# Patient Record
Sex: Female | Born: 1955 | Race: White | Hispanic: No | Smoking: Current every day smoker
Health system: Southern US, Community
[De-identification: ages and names within clinical notes are randomized; demographics above are authoritative.]

## PROBLEM LIST (undated history)

## (undated) DIAGNOSIS — K642 Third degree hemorrhoids: Secondary | ICD-10-CM

## (undated) DIAGNOSIS — R42 Dizziness and giddiness: Secondary | ICD-10-CM

## (undated) DIAGNOSIS — K589 Irritable bowel syndrome without diarrhea: Secondary | ICD-10-CM

## (undated) DIAGNOSIS — F259 Schizoaffective disorder, unspecified: Secondary | ICD-10-CM

## (undated) DIAGNOSIS — R296 Repeated falls: Secondary | ICD-10-CM

## (undated) DIAGNOSIS — F0390 Unspecified dementia without behavioral disturbance: Secondary | ICD-10-CM

## (undated) DIAGNOSIS — M5136 Other intervertebral disc degeneration, lumbar region: Secondary | ICD-10-CM

## (undated) DIAGNOSIS — K219 Gastro-esophageal reflux disease without esophagitis: Secondary | ICD-10-CM

## (undated) DIAGNOSIS — F329 Major depressive disorder, single episode, unspecified: Secondary | ICD-10-CM

## (undated) DIAGNOSIS — F32A Depression, unspecified: Secondary | ICD-10-CM

## (undated) DIAGNOSIS — K648 Other hemorrhoids: Secondary | ICD-10-CM

## (undated) DIAGNOSIS — I514 Myocarditis, unspecified: Secondary | ICD-10-CM

## (undated) DIAGNOSIS — M1990 Unspecified osteoarthritis, unspecified site: Secondary | ICD-10-CM

## (undated) DIAGNOSIS — D126 Benign neoplasm of colon, unspecified: Secondary | ICD-10-CM

## (undated) DIAGNOSIS — K5790 Diverticulosis of intestine, part unspecified, without perforation or abscess without bleeding: Secondary | ICD-10-CM

## (undated) HISTORY — DX: Repeated falls: R29.6

## (undated) HISTORY — DX: Third degree hemorrhoids: K64.2

## (undated) HISTORY — DX: Unspecified osteoarthritis, unspecified site: M19.90

## (undated) HISTORY — PX: CHOLECYSTECTOMY: SHX55

## (undated) HISTORY — PX: HEMORRHOID BANDING: SHX5850

## (undated) HISTORY — DX: Major depressive disorder, single episode, unspecified: F32.9

## (undated) HISTORY — DX: Dizziness and giddiness: R42

## (undated) HISTORY — DX: Other hemorrhoids: K64.8

## (undated) HISTORY — DX: Myocarditis, unspecified: I51.4

## (undated) HISTORY — DX: Gastro-esophageal reflux disease without esophagitis: K21.9

## (undated) HISTORY — DX: Diverticulosis of intestine, part unspecified, without perforation or abscess without bleeding: K57.90

## (undated) HISTORY — DX: Other intervertebral disc degeneration, lumbar region: M51.36

## (undated) HISTORY — DX: Unspecified dementia, unspecified severity, without behavioral disturbance, psychotic disturbance, mood disturbance, and anxiety: F03.90

## (undated) HISTORY — DX: Depression, unspecified: F32.A

## (undated) HISTORY — DX: Benign neoplasm of colon, unspecified: D12.6

## (undated) HISTORY — DX: Schizoaffective disorder, unspecified: F25.9

## (undated) HISTORY — DX: Irritable bowel syndrome, unspecified: K58.9

---

## 2006-10-11 ENCOUNTER — Inpatient Hospital Stay (HOSPITAL_COMMUNITY): Admission: AD | Admit: 2006-10-11 | Discharge: 2006-10-17 | Payer: Self-pay | Admitting: *Deleted

## 2006-10-11 ENCOUNTER — Ambulatory Visit: Payer: Self-pay | Admitting: *Deleted

## 2006-12-18 ENCOUNTER — Emergency Department (HOSPITAL_COMMUNITY): Admission: EM | Admit: 2006-12-18 | Discharge: 2006-12-18 | Payer: Self-pay | Admitting: Emergency Medicine

## 2007-04-11 DIAGNOSIS — I514 Myocarditis, unspecified: Secondary | ICD-10-CM

## 2007-04-11 HISTORY — DX: Myocarditis, unspecified: I51.4

## 2008-09-03 ENCOUNTER — Emergency Department (HOSPITAL_BASED_OUTPATIENT_CLINIC_OR_DEPARTMENT_OTHER): Admission: EM | Admit: 2008-09-03 | Discharge: 2008-09-03 | Payer: Self-pay | Admitting: Emergency Medicine

## 2008-09-14 ENCOUNTER — Emergency Department (HOSPITAL_COMMUNITY): Admission: EM | Admit: 2008-09-14 | Discharge: 2008-09-14 | Payer: Self-pay | Admitting: Emergency Medicine

## 2010-07-18 LAB — COMPREHENSIVE METABOLIC PANEL
AST: 38 U/L — ABNORMAL HIGH (ref 0–37)
Albumin: 3.7 g/dL (ref 3.5–5.2)
Alkaline Phosphatase: 108 U/L (ref 39–117)
CO2: 27 mEq/L (ref 19–32)
Chloride: 105 mEq/L (ref 96–112)
Creatinine, Ser: 0.84 mg/dL (ref 0.4–1.2)
GFR calc Af Amer: >60 mL/min (ref >60)
GFR calc non Af Amer: >60 mL/min (ref >60)
Potassium: 3.1 mEq/L — ABNORMAL LOW (ref 3.5–5.1)
Total Bilirubin: 0.7 mg/dL (ref 0.3–1.2)

## 2010-07-18 LAB — URINALYSIS, ROUTINE W REFLEX MICROSCOPIC
Bilirubin Urine: NEGATIVE
Hgb urine dipstick: NEGATIVE
Ketones, ur: NEGATIVE mg/dL
Nitrite: NEGATIVE
Protein, ur: NEGATIVE mg/dL
Urobilinogen, UA: 1 mg/dL (ref 0.0–1.0)

## 2010-07-18 LAB — CBC
HCT: 43 % (ref 36.0–46.0)
MCV: 90.6 fL (ref 78.0–100.0)
Platelets: 269 10*3/uL (ref 150–400)
RBC: 4.74 MIL/uL (ref 3.87–5.11)
WBC: 9.9 10*3/uL (ref 4.0–10.5)

## 2010-07-18 LAB — URINE CULTURE: Colony Count: 60000

## 2010-07-18 LAB — DIFFERENTIAL
Basophils Absolute: 0.1 10*3/uL (ref 0.0–0.1)
Basophils Relative: 1 % (ref 0–1)
Eosinophils Absolute: 0.1 10*3/uL (ref 0.0–0.7)
Eosinophils Relative: 1 % (ref 0–5)
Lymphocytes Relative: 22 % (ref 12–46)
Monocytes Absolute: 0.7 10*3/uL (ref 0.1–1.0)

## 2010-07-18 LAB — LIPASE, BLOOD: Lipase: 17 U/L (ref 11–59)

## 2010-07-18 LAB — URINE MICROSCOPIC-ADD ON

## 2010-07-19 LAB — CBC
HCT: 51 % — ABNORMAL HIGH (ref 36.0–46.0)
Hemoglobin: 17.4 g/dL — ABNORMAL HIGH (ref 12.0–15.0)
MCHC: 34.1 g/dL (ref 30.0–36.0)
Platelets: 275 10*3/uL (ref 150–400)
RDW: 12.7 % (ref 11.5–15.5)

## 2010-07-19 LAB — BASIC METABOLIC PANEL
BUN: 17 mg/dL (ref 6–23)
CO2: 28 mEq/L (ref 19–32)
Calcium: 9.8 mg/dL (ref 8.4–10.5)
Glucose, Bld: 91 mg/dL (ref 70–99)
Sodium: 140 mEq/L (ref 135–145)

## 2010-07-19 LAB — DIFFERENTIAL
Basophils Absolute: 0.2 10*3/uL — ABNORMAL HIGH (ref 0.0–0.1)
Basophils Relative: 2 % — ABNORMAL HIGH (ref 0–1)
Eosinophils Relative: 1 % (ref 0–5)
Monocytes Absolute: 0.9 10*3/uL (ref 0.1–1.0)
Neutro Abs: 6.6 10*3/uL (ref 1.7–7.7)

## 2010-07-19 LAB — URINALYSIS, ROUTINE W REFLEX MICROSCOPIC
Ketones, ur: 15 mg/dL — AB
Nitrite: NEGATIVE
Urobilinogen, UA: 1 mg/dL (ref 0.0–1.0)

## 2010-07-19 LAB — URINE MICROSCOPIC-ADD ON

## 2010-07-19 LAB — POCT TOXICOLOGY PANEL

## 2010-08-23 NOTE — H&P (Signed)
[name], [name]              ACCOUNT NO.:  [id number]   MEDICAL RECORD NO.:  [id number]          PATIENT TYPE:  IPS   LOCATION:  0303                          FACILITY:  [facility]   PHYSICIAN:  Jasmine Pang, M.D. DATE OF BIRTH:  [birth year]   DATE OF ADMISSION:  10/11/2006  DATE OF DISCHARGE:                       PSYCHIATRIC ADMISSION ASSESSMENT   IDENTIFYING INFORMATION:  This is a 55 year old female voluntarily  admitted on October 11, 2006.   HISTORY OF PRESENT ILLNESS:  The patient present psychotic and paranoid,  endorsing auditory hallucinations, apparently some noncompliance of  medications.  Per chart, states that the patient apparently called 9-1-  1.  Police came over and found the patient almost catatonic.  When the  patient arrived, she was refusing to answer any questions, refusing to  confirm any current medications.  She made the statement that she  wished she never came.  She does state that she feels that she needs  some medications.   PAST PSYCHIATRIC HISTORY:  First admission to the [facility].  She is currently sponsored by [facility] and  appears to be a patient at [facility].   SOCIAL HISTORY:  This is a 54 year old female that lives alone.  She is  unemployed.   FAMILY HISTORY:  Unknown.   ALCOHOL/DRUG HISTORY:  There seems to be no apparent alcohol or drug  use.   PRIMARY CARE PHYSICIAN:  The patient says none.   MEDICAL PROBLEMS:  Patient reports that she has some problems with blood  pressure but it is not known if she is on any medications.   MEDICATIONS:  Per chart, the patient has been on Celexa, trazodone and  Vistaril and the patient refers to being on Abilify.   ALLERGIES:  No known allergies.   PAST MEDICAL HISTORY:  Some question in regards to hypertension.   MEDICATIONS:  Seems to be Celexa, trazodone and Abilify.   REVIEW OF SYSTEMS:  Noncontributory.  The patient is not willing  to  answer questions.   PHYSICAL EXAMINATION:  Temperature is 97.7, heart rate 68, respirations  16, blood pressure 126/81, height 5 feet 2 inches tall, weight 140  pounds.  Physical exam at this time is deferred because the patient is  paranoid and fearful of others.  She does appear however very well-  nourished and in no acute distress.  She is fair-skinned and again does  not seem to be in any acute physical distress.   LABORATORY DATA:  Her labs are pending at this time.   MENTAL STATUS EXAM:  She is in the bed.  She appears again asking for  medications.  Questioning why we have keys in our hands and opens eyes  briefly.  Her speech is soft-spoken with some thought blocking.  The  patient is guarded, possibly responding to internal stimuli, has the  covers up to her chin, making very little movements.  Thought process  are some questionable auditory hallucinations with some paranoid  ideation.  Cognitive functioning, the patient is aware of herself.  Her  judgment and insight are poor.  Poor historian.   DIAGNOSES:  AXIS I:  Psychosis not otherwise specified.  AXIS II:  Deferred.  AXIS III:  Unknown.  AXIS IV:  Other psychosocial problems.  AXIS V:  Current 25.   PLAN:  Contract for safety.  Stabilize mood and thinking.  Will have  Zyprexa scheduled with some p.r.n. dosing available with some Ativan for  anxiety.  We will gather more information.  The patient is to follow up  with [facility] and be medication compliance.   TENTATIVE LENGTH OF STAY:  Her tentative length of stay at this time is  five to six days.      Landry Corporal, N.P.      Jasmine Pang, M.D.  Electronically Signed    JO/MEDQ  D:  10/12/2006  T:  10/12/2006  Job:  [id number]

## 2010-08-23 NOTE — Discharge Summary (Signed)
[name], [name]              ACCOUNT NO.:  [id number]   MEDICAL RECORD NO.:  [id number]          PATIENT TYPE:  IPS   LOCATION:  0303                          FACILITY:  [facility]   PHYSICIAN:  Jasmine Pang, M.D. DATE OF BIRTH:  Sep 10, 1955   DATE OF ADMISSION:  10/11/2006  DATE OF DISCHARGE:  10/17/2006                               DISCHARGE SUMMARY   IDENTIFYING INFORMATION:  This is a 55 year old white female who was  admitted on a voluntary basis on October 11, 2006.   HISTORY OF PRESENT ILLNESS:  The patient presented with psychotic and  paranoid ideation.  She endorses auditory hallucinations.  There has  apparently been some noncompliance of medication.  Per chart, it is  indicated that the patient called 9-1-1.  The police came over and found  the patient almost catatonic.  When she arrived, she was refusing to  answer any questions or refusing to confirm any current medications.  She made the statement that she wished she never came.  She does state  that she feels she needs some medications.  This is the first admission  to [facility].  The patient is currently sponsored by the  [facility] and she is a patient at [facility].  There is no apparent drug or alcohol use.  The  patient has some problems with blood pressures.   MEDICATIONS:  The patient has been on Celexa, trazodone, Vistaril and  Abilify in the past.   ALLERGIES:  No known drug allergies.   PHYSICAL EXAMINATION:  Physical exam was done in the ED prior to  admission.  There were no acute physical or medical problems.   LABORATORY:  Comprehensive metabolic panel was within normal limits.  CBC revealed an elevated RBC count of 5.13, elevated hemoglobin of 16.4  and an elevated hematocrit of 47.2.  TSH was normal at 1.787.  Urinalysis revealed 7-10 wbc and many bacteria.   HOSPITAL COURSE:  The patient was begun on Zyprexa Zydis 5 mg p.o.  q.h.s. and  Zyprexa Zydis 5 mg p.o. q.6h. p.r.n. psychosis.  On October 12, 2006, Zyprexa Zydis was increased to 5 mg in the morning and 5 mg at  h.s.  She was also started on Ativan 1 mg p.o. q.4h. p.r.n. anxiety,  agitation.  On October 14, 2006, Zyprexa was changed to 10 mg p.o. q.h.s.  On October 15, 2006, Zyprexa was increased to 15 mg p.o. q.h.s.  The patient  was not treated for UTI while on our unit but was given a prescription  for Cipro 250 mg p.o. b.i.d. x3 days upon discharge.   Upon first meeting the patient, she was lying in bed with fair eye  contact.  She did speak to me but speech was soft and slow.  There was  positive psychomotor retardation but she was not catatonic at this  point.  She admitted to being depressed and suicidal before coming in  here.  She remembered calling the police to come to her apartment.  As  her hospitalization progressed, the patient's mood  improved.  She was  complaining of sedation on medications.  She did continue to feel my  thoughts aren't straight yet.  She had no suicidal or homicidal  ideation.  Thoughts were still somewhat disorganized.  She was paranoid  at times and fearful about what I was writing down when we talked.  Zyprexa was increased to 15 mg p.o. q.h.s.  The patient was able to  participate appropriately in unit therapeutic groups and activities.  On  October 16, 2006, the patient's mood was less depressed and anxious.  No  suicidal or homicidal ideation.  No auditory or visual hallucinations.  She still felt like her thinking was slightly disorganized but nothing  remarkable.  On October 17, 2006, the patient's mental status had improved  markedly from admission status.  She was friendly and cooperative with  good eye contact.  Speech was normal rate and flow.  Psychomotor  activity was within normal limits.  Mood was euthymic.  Affect wide  range.  There was no suicidal or homicidal ideation.  No thoughts of  self-injurious behavior.  No paranoia or  delusions.  Thoughts were  logical and goal-directed.  No auditory or visual hallucinations.  Thought content no predominant theme.  Cognitive was grossly back to  baseline.  It was felt the patient was safe to be discharged today.   DISCHARGE DIAGNOSES:  AXIS I:  Psychotic disorder not otherwise  specified.  AXIS II:  None.  AXIS III:  Hypertension.  AXIS IV:  Moderate (other psychosocial problems, burden of psychiatric  illness).  AXIS V:  GAF on discharge 50; GAF admission 25; GAF highest past year 55-  60.   ACTIVITY/DIET:  There were no specific activity level or dietary  restrictions.   FOLLOWUP:  The patient will be seen by Dr. Lang Snow at the [facility]  on October 18, 2006 at 8:30 a.m.   DISCHARGE MEDICATIONS:  1. Zyprexa 15 mg p.o. q.h.s.  2. Ambien 10 mg p.o. q.h.s.  3. Cipro 250 mg p.o. b.i.d. x3 days.      Jasmine Pang, M.D.  Electronically Signed     BHS/MEDQ  D:  10/17/2006  T:  10/17/2006  Job:  [id number]

## 2011-01-20 LAB — URINALYSIS, ROUTINE W REFLEX MICROSCOPIC
Glucose, UA: NEGATIVE
Nitrite: NEGATIVE
Protein, ur: NEGATIVE
Urobilinogen, UA: 0.2

## 2011-01-20 LAB — DIFFERENTIAL
Basophils Absolute: 0.1
Basophils Relative: 1
Monocytes Relative: 7
Neutro Abs: 7.2
Neutrophils Relative %: 75

## 2011-01-20 LAB — URINE CULTURE: Colony Count: 6000

## 2011-01-20 LAB — COMPREHENSIVE METABOLIC PANEL
Alkaline Phosphatase: 120 — ABNORMAL HIGH
BUN: <1 — ABNORMAL LOW
Calcium: 9.6
Glucose, Bld: 97
Potassium: 3.5
Total Protein: 7.1

## 2011-01-20 LAB — CBC
HCT: 44.3
Hemoglobin: 15.6 — ABNORMAL HIGH
MCHC: 35.3
MCV: 92.1
RDW: 14

## 2011-01-20 LAB — URINE MICROSCOPIC-ADD ON

## 2011-01-24 LAB — COMPREHENSIVE METABOLIC PANEL
Alkaline Phosphatase: 90
BUN: 6
Chloride: 103
Glucose, Bld: 96
Potassium: 3.7
Total Bilirubin: 0.8

## 2011-01-24 LAB — CBC
HCT: 47.2 — ABNORMAL HIGH
Hemoglobin: 16.4 — ABNORMAL HIGH
RBC: 5.13 — ABNORMAL HIGH
WBC: 9.6

## 2011-01-24 LAB — DRUGS OF ABUSE SCREEN W/O ALC, ROUTINE URINE
Amphetamine Screen, Ur: NEGATIVE
Barbiturate Quant, Ur: NEGATIVE
Creatinine,U: 162.6
Methadone: NEGATIVE

## 2011-01-24 LAB — URINE MICROSCOPIC-ADD ON

## 2011-01-24 LAB — URINALYSIS, ROUTINE W REFLEX MICROSCOPIC
Glucose, UA: NEGATIVE
Hgb urine dipstick: NEGATIVE
pH: 6

## 2011-01-24 LAB — TSH: TSH: 1.787

## 2011-08-09 DIAGNOSIS — D126 Benign neoplasm of colon, unspecified: Secondary | ICD-10-CM

## 2011-08-09 HISTORY — DX: Benign neoplasm of colon, unspecified: D12.6

## 2012-12-27 HISTORY — PX: BREAST BIOPSY: SHX20

## 2013-09-16 ENCOUNTER — Telehealth (HOSPITAL_COMMUNITY): Payer: Self-pay

## 2013-10-31 ENCOUNTER — Encounter: Payer: Self-pay | Admitting: Gastroenterology

## 2013-11-04 ENCOUNTER — Encounter (INDEPENDENT_AMBULATORY_CARE_PROVIDER_SITE_OTHER): Payer: Self-pay

## 2013-11-04 ENCOUNTER — Ambulatory Visit (INDEPENDENT_AMBULATORY_CARE_PROVIDER_SITE_OTHER): Payer: Medicaid Other | Admitting: Psychiatry

## 2013-11-04 ENCOUNTER — Encounter (HOSPITAL_COMMUNITY): Payer: Self-pay | Admitting: Psychiatry

## 2013-11-04 VITALS — BP 135/84 | HR 68 | Ht 63.0 in | Wt 183.0 lb

## 2013-11-04 DIAGNOSIS — F259 Schizoaffective disorder, unspecified: Secondary | ICD-10-CM

## 2013-11-04 DIAGNOSIS — F17210 Nicotine dependence, cigarettes, uncomplicated: Secondary | ICD-10-CM | POA: Insufficient documentation

## 2013-11-04 DIAGNOSIS — F17200 Nicotine dependence, unspecified, uncomplicated: Secondary | ICD-10-CM

## 2013-11-04 DIAGNOSIS — F4310 Post-traumatic stress disorder, unspecified: Secondary | ICD-10-CM | POA: Insufficient documentation

## 2013-11-04 DIAGNOSIS — F251 Schizoaffective disorder, depressive type: Secondary | ICD-10-CM | POA: Insufficient documentation

## 2013-11-04 MED ORDER — SERTRALINE HCL 100 MG PO TABS: 150.0000 mg | ORAL_TABLET | Freq: Every day | ORAL | Status: DC
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
## 2013-11-05 ENCOUNTER — Telehealth (HOSPITAL_COMMUNITY): Payer: Self-pay | Admitting: *Deleted

## 2013-11-05 NOTE — Telephone Encounter (Signed)
Patient left WG:NFAOZH dose was increased by MD. It was discussed.Thought MD sending new RX, but she did not.Please call. Phoned patient - Left message: Prescription for Zoloft 100 mg, take 1.5 daily sent to [facility] (listed as preferred pharmacy) on 7/28 and noted as received by pharmacy

## 2013-12-09 ENCOUNTER — Ambulatory Visit (INDEPENDENT_AMBULATORY_CARE_PROVIDER_SITE_OTHER): Payer: Medicaid Other | Admitting: Cardiology

## 2013-12-09 ENCOUNTER — Encounter: Payer: Self-pay | Admitting: Cardiology

## 2013-12-09 VITALS — BP 120/88 | Ht 63.0 in | Wt 180.0 lb

## 2013-12-09 DIAGNOSIS — R0609 Other forms of dyspnea: Secondary | ICD-10-CM

## 2013-12-09 DIAGNOSIS — E669 Obesity, unspecified: Secondary | ICD-10-CM

## 2013-12-09 DIAGNOSIS — R0989 Other specified symptoms and signs involving the circulatory and respiratory systems: Secondary | ICD-10-CM

## 2013-12-09 DIAGNOSIS — I514 Myocarditis, unspecified: Secondary | ICD-10-CM

## 2013-12-09 DIAGNOSIS — R0600 Dyspnea, unspecified: Secondary | ICD-10-CM
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
## 2013-12-09 NOTE — Patient Instructions (Signed)
The current medical regimen is effective;  continue present plan and medications.  Your physician has requested that you have an echocardiogram. Echocardiography is a painless test that uses sound waves to create images of your heart. It provides your doctor with information about the size and shape of your heart and how well your heart's chambers and valves are working. This procedure takes approximately one hour. There are no restrictions for this procedure.  Follow up as needed. 

## 2013-12-12 ENCOUNTER — Ambulatory Visit (HOSPITAL_COMMUNITY): Payer: PRIVATE HEALTH INSURANCE | Attending: Cardiology | Admitting: Radiology

## 2013-12-12 DIAGNOSIS — R0600 Dyspnea, unspecified: Secondary | ICD-10-CM

## 2013-12-12 DIAGNOSIS — I514 Myocarditis, unspecified: Secondary | ICD-10-CM

## 2013-12-12 DIAGNOSIS — R0602 Shortness of breath: Secondary | ICD-10-CM | POA: Insufficient documentation

## 2013-12-12 NOTE — Progress Notes (Signed)
Echocardiogram performed.  

## 2014-01-07 ENCOUNTER — Ambulatory Visit (INDEPENDENT_AMBULATORY_CARE_PROVIDER_SITE_OTHER): Payer: Medicaid Other | Admitting: Gastroenterology

## 2014-01-07 ENCOUNTER — Encounter: Payer: Self-pay | Admitting: Gastroenterology

## 2014-01-07 VITALS — BP 110/78 | HR 68 | Ht 63.0 in | Wt 181.8 lb

## 2014-01-07 DIAGNOSIS — K589 Irritable bowel syndrome without diarrhea: Secondary | ICD-10-CM

## 2014-01-07 DIAGNOSIS — Z86010 Personal history of colonic polyps: Secondary | ICD-10-CM

## 2014-01-07 DIAGNOSIS — K219 Gastro-esophageal reflux disease without esophagitis: Secondary | ICD-10-CM

## 2014-01-07 DIAGNOSIS — R198 Other specified symptoms and signs involving the digestive system and abdomen: Secondary | ICD-10-CM

## 2014-01-07 MED ORDER — OMEPRAZOLE 40 MG PO CPDR: 40.0000 mg | DELAYED_RELEASE_CAPSULE | Freq: Every morning | ORAL | Status: DC

## 2014-01-07 MED ORDER — DICYCLOMINE HCL 10 MG PO CAPS: 10.0000 mg | ORAL_CAPSULE | Freq: Three times a day (TID) | ORAL | Status: DC

## 2014-01-07 NOTE — Progress Notes (Signed)
    History of Present Illness: This is a 58 year old female who relates a very long history of alternating diarrhea and constipation associated with crampy generalized abdominal pain. Her abdominal pain is generally relieved by bowel movements. Her IBS symptoms generally worsened with stress. She also has had GERD and has been treated for acid reflux since the 1990s. She states she underwent upper endoscopy in 1990s which was unremarkable. She underwent colonoscopy in 2004 in [place] it showed a polyp. Subsequent colonoscopy by Dr. Toy Cookey in May 2013 revealed internal hemorrhoids, diverticulosis and a small single tubular adenoma in the transverse colon. She has frequent breakthrough reflux symptoms. She eats 1 large meal each day. Denies weight loss, change in stool caliber, melena, hematochezia, nausea, vomiting, dysphagia, chest pain.  Review of Systems: Pertinent positive and negative review of systems were noted in the above HPI section. All other review of systems were otherwise negative.  Current Medications, Allergies, Past Medical History, Past Surgical History, Family History and Social History were reviewed in [facility] record.  Physical Exam: General: Well developed , well nourished, no acute distress Head: Normocephalic and atraumatic Eyes:  sclerae anicteric, EOMI Ears: Normal auditory acuity Mouth: No deformity or lesions Neck: Supple, no masses or thyromegaly Lungs: Clear throughout to auscultation Heart: Regular rate and rhythm; no murmurs, rubs or bruits Abdomen: Soft, non tender and non distended. No masses, hepatosplenomegaly or hernias noted. Normal Bowel sounds Musculoskeletal: Symmetrical with no gross deformities  Skin: No lesions on visible extremities Pulses:  Normal pulses noted Extremities: No clubbing, cyanosis, edema or deformities noted Neurological: Alert oriented x 4, grossly nonfocal Cervical Nodes:  No significant cervical  adenopathy Inguinal Nodes: No significant inguinal adenopathy Psychological:  Alert and cooperative. Normal mood and affect  Assessment and Recommendations:  1. Personal history of adenomatous colon polyp initially diagnosed on colonoscopy in May 2013. Five-year interval surveillance colonoscopy in May 2018.  2. Irritable bowel syndrome with abdominal pain and alternating diarrhea and constipation. Increase dietary fiber and water intake. Daily probiotic if helpful. Bentyl 10 mg tid ac. Avoid foods that trigger symptoms.   3. GERD. Antireflux measures including bed blocks. Change timing of omeprazole to 40 mg before evening meal. Advised to have 3 meals each day, even if small. Avoid having 1 large meal. Consider EGD if symptoms not well controlled.  4. Schizoaffective disorder and PTSD likely contributing to IBS symptoms.

## 2014-01-07 NOTE — Patient Instructions (Signed)
We have sent the following medications to your pharmacy for you to pick up at your convenience:Bentyl.  Start taking your omeprazole 40 mg one tablet by mouth every morning. A new prescription has been sent to your pharmacy.   Patient advised to avoid spicy, acidic, citrus, chocolate, mints, fruit and fruit juices.  Limit the intake of caffeine, alcohol and Soda.  Don't exercise too soon after eating.  Don't lie down within 3-4 hours of eating.  Elevate the head of your bed.  Start a daily probiotic such as Electronics engineer.   Your follow up appointment with Dr. Fuller Plan is scheduled for 02/09/14 at 9:00am. If you need to reschedule to cancel, please call [phone].  High-Fiber Diet Fiber is found in fruits, vegetables, and grains. A high-fiber diet encourages the addition of more whole grains, legumes, fruits, and vegetables in your diet. The recommended amount of fiber for adult males is 38 g per day. For adult females, it is 25 g per day. Pregnant and lactating women should get 28 g of fiber per day. If you have a digestive or bowel problem, ask your caregiver for advice before adding high-fiber foods to your diet. Eat a variety of high-fiber foods instead of only a select few type of foods.  PURPOSE  To increase stool bulk.  To make bowel movements more regular to prevent constipation.  To lower cholesterol.  To prevent overeating. WHEN IS THIS DIET USED?  It may be used if you have constipation and hemorrhoids.  It may be used if you have uncomplicated diverticulosis (intestine condition) and irritable bowel syndrome.  It may be used if you need help with weight management.  It may be used if you want to add it to your diet as a protective measure against atherosclerosis, diabetes, and cancer. SOURCES OF FIBER  Whole-grain breads and cereals.  Fruits, such as apples, oranges, bananas, berries, prunes, and pears.  Vegetables, such as green peas, carrots, sweet potatoes, beets, broccoli,  cabbage, spinach, and artichokes.  Legumes, such split peas, soy, lentils.  Almonds. FIBER CONTENT IN FOODS Starches and Grains / Dietary Fiber (g)  Cheerios, 1 cup / 3 g  Corn Flakes cereal, 1 cup / 0.7 g  Rice crispy treat cereal, 1 cup / 0.3 g  Instant oatmeal (cooked),  cup / 2 g  Frosted wheat cereal, 1 cup / 5.1 g  Brown, long-grain rice (cooked), 1 cup / 3.5 g  White, long-grain rice (cooked), 1 cup / 0.6 g  Enriched macaroni (cooked), 1 cup / 2.5 g Legumes / Dietary Fiber (g)  Baked beans (canned, plain, or vegetarian),  cup / 5.2 g  Kidney beans (canned),  cup / 6.8 g  Pinto beans (cooked),  cup / 5.5 g Breads and Crackers / Dietary Fiber (g)  Plain or honey graham crackers, 2 squares / 0.7 g  Saltine crackers, 3 squares / 0.3 g  Plain, salted pretzels, 10 pieces / 1.8 g  Whole-wheat bread, 1 slice / 1.9 g  White bread, 1 slice / 0.7 g  Raisin bread, 1 slice / 1.2 g  Plain bagel, 3 oz / 2 g  Flour tortilla, 1 oz / 0.9 g  Corn tortilla, 1 small / 1.5 g  Hamburger or hotdog bun, 1 small / 0.9 g Fruits / Dietary Fiber (g)  Apple with skin, 1 medium / 4.4 g  Sweetened applesauce,  cup / 1.5 g  Banana,  medium / 1.5 g  Grapes, 10 grapes / 0.4 g  Orange, 1 small / 2.3 g  Raisin, 1.5 oz / 1.6 g  Melon, 1 cup / 1.4 g Vegetables / Dietary Fiber (g)  Green beans (canned),  cup / 1.3 g  Carrots (cooked),  cup / 2.3 g  Broccoli (cooked),  cup / 2.8 g  Peas (cooked),  cup / 4.4 g  Mashed potatoes,  cup / 1.6 g  Lettuce, 1 cup / 0.5 g  Corn (canned),  cup / 1.6 g  Tomato,  cup / 1.1 g Document Released: 03/27/2005 Document Revised: 09/26/2011 Document Reviewed: 06/29/2011 [employer] Patient Information 2015 [place], [place]. This information is not intended to replace advice given to you by your health care provider. Make sure you discuss any questions you have with your health care provider.  Irritable Bowel  Syndrome Irritable bowel syndrome (IBS) is caused by a disturbance of normal bowel function and is a common digestive disorder. You may also hear this condition called spastic colon, mucous colitis, and irritable colon. There is no cure for IBS. However, symptoms often gradually improve or disappear with a good diet, stress management, and medicine. This condition usually appears in late adolescence or early adulthood. Women develop it twice as often as men. CAUSES  After food has been digested and absorbed in the small intestine, waste material is moved into the large intestine, or colon. In the colon, water and salts are absorbed from the undigested products coming from the small intestine. The remaining residue, or fecal material, is held for elimination. Under normal circumstances, gentle, rhythmic contractions of the bowel walls push the fecal material along the colon toward the rectum. In IBS, however, these contractions are irregular and poorly coordinated. The fecal material is either retained too long, resulting in constipation, or expelled too soon, producing diarrhea. SIGNS AND SYMPTOMS  The most common symptom of IBS is abdominal pain. It is often in the lower left side of the abdomen, but it may occur anywhere in the abdomen. The pain comes from spasms of the bowel muscles happening too much and from the buildup of gas and fecal material in the colon. This pain:  Can range from sharp abdominal cramps to a dull, continuous ache.  Often worsens soon after eating.  Is often relieved by having a bowel movement or passing gas. Abdominal pain is usually accompanied by constipation, but it may also produce diarrhea. The diarrhea often occurs right after a meal or upon waking up in the morning. The stools are often soft, watery, and flecked with mucus. Other symptoms of IBS include:  Bloating.  Loss of appetite.  Heartburn.  Backache.  Dull pain in the arms or  shoulders.  Nausea.  Burping.  Vomiting.  Gas. IBS may also cause symptoms that are unrelated to the digestive system, such as:  Fatigue.  Headaches.  Anxiety.  Shortness of breath.  Trouble concentrating.  Dizziness. These symptoms tend to come and go. DIAGNOSIS  The symptoms of IBS may seem like symptoms of other, more serious digestive disorders. Your health care provider may want to perform tests to exclude these disorders.  TREATMENT Many medicines are available to help correct bowel function or relieve bowel spasms and abdominal pain. Among the medicines available are:  Laxatives for severe constipation and to help restore normal bowel habits.  Specific antidiarrheal medicines to treat severe or lasting diarrhea.  Antispasmodic agents to relieve intestinal cramps. Your health care provider may also decide to treat you with a mild tranquilizer or sedative during unusually stressful  periods in your life. Your health care provider may also prescribe antidepressant medicine. The use of this medicine has been shown to reduce pain and other symptoms of IBS. Remember that if any medicine is prescribed for you, you should take it exactly as directed. Make sure your health care provider knows how well it worked for you. HOME CARE INSTRUCTIONS   Take all medicines as directed by your health care provider.  Avoid foods that are high in fat or oils, such as heavy cream, butter, frankfurters, sausage, and other fatty meats.  Avoid foods that make you go to the bathroom, such as fruit, fruit juice, and dairy products.  Cut out carbonated drinks, chewing gum, and "gassy" foods such as beans and cabbage. This may help relieve bloating and burping.  Eat foods with bran, and drink plenty of liquids with the bran foods. This helps relieve constipation.  Keep track of what foods seem to bring on your symptoms.  Avoid emotionally charged situations or circumstances that produce  anxiety.  Start or continue exercising.  Get plenty of rest and sleep. Document Released: 03/27/2005 Document Revised: 04/01/2013 Document Reviewed: 11/15/2007 [facility] Patient Information 2015 [place], [place]. This information is not intended to replace advice given to you by your health care provider. Make sure you discuss any questions you have with your health care provider.  Thank you for choosing me and [facility].  Pricilla Riffle. Dagoberto Ligas., MD., Marval Regal

## 2014-01-08 ENCOUNTER — Encounter (HOSPITAL_COMMUNITY): Payer: Self-pay | Admitting: Psychiatry

## 2014-01-08 ENCOUNTER — Ambulatory Visit (HOSPITAL_COMMUNITY): Payer: Self-pay | Admitting: Psychiatry

## 2014-01-08 ENCOUNTER — Ambulatory Visit (INDEPENDENT_AMBULATORY_CARE_PROVIDER_SITE_OTHER): Payer: 59 | Admitting: Psychiatry

## 2014-01-08 VITALS — BP 119/84 | HR 69 | Ht 63.0 in | Wt 181.8 lb

## 2014-01-08 DIAGNOSIS — F251 Schizoaffective disorder, depressive type: Secondary | ICD-10-CM

## 2014-01-08 DIAGNOSIS — F4310 Post-traumatic stress disorder, unspecified: Secondary | ICD-10-CM

## 2014-01-08 DIAGNOSIS — F17200 Nicotine dependence, unspecified, uncomplicated: Secondary | ICD-10-CM

## 2014-01-08 MED ORDER — BENZTROPINE MESYLATE 1 MG PO TABS: 1.0000 mg | ORAL_TABLET | Freq: Every day | ORAL | Status: DC

## 2014-01-08 MED ORDER — SERTRALINE HCL 100 MG PO TABS: 200.0000 mg | ORAL_TABLET | Freq: Every day | ORAL | Status: DC

## 2014-01-08 MED ORDER — TRAZODONE HCL 100 MG PO TABS: 200.0000 mg | ORAL_TABLET | Freq: Every day | ORAL | Status: DC

## 2014-01-08 MED ORDER — ZIPRASIDONE HCL 80 MG PO CAPS: 80.0000 mg | ORAL_CAPSULE | Freq: Two times a day (BID) | ORAL | Status: DC
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
## 2014-02-09 ENCOUNTER — Ambulatory Visit: Payer: Self-pay | Admitting: Gastroenterology

## 2014-02-16 ENCOUNTER — Encounter: Payer: Self-pay | Admitting: Gastroenterology

## 2014-02-16 ENCOUNTER — Ambulatory Visit (INDEPENDENT_AMBULATORY_CARE_PROVIDER_SITE_OTHER): Payer: PRIVATE HEALTH INSURANCE | Admitting: Gastroenterology

## 2014-02-16 VITALS — BP 158/90 | HR 68 | Ht 63.0 in | Wt 177.6 lb

## 2014-02-16 DIAGNOSIS — K219 Gastro-esophageal reflux disease without esophagitis: Secondary | ICD-10-CM

## 2014-02-16 DIAGNOSIS — K589 Irritable bowel syndrome without diarrhea: Secondary | ICD-10-CM

## 2014-02-16 NOTE — Patient Instructions (Signed)
Follow up in one year   Cc. Tammy Boyd,MD

## 2014-02-16 NOTE — Progress Notes (Signed)
    History of Present Illness: This is a 58 year old female returning for follow-up of IBS and GERD. She is following a high fiber diet and taking Bentyl on a regular basis. She is no longer taking omeprazole on a daily basis as previously recommended he takes it as needed. She has no GI complaints today.   Current Medications, Allergies, Past Medical History, Past Surgical History, Family History and Social History were reviewed in [employer] record.  Physical Exam: General: Well developed , well nourished, no acute distress Head: Normocephalic and atraumatic Eyes:  sclerae anicteric, EOMI Ears: Normal auditory acuity Mouth: No deformity or lesions Lungs: Clear throughout to auscultation Heart: Regular rate and rhythm; no murmurs, rubs or bruits Abdomen: Soft, non tender and non distended. No masses, hepatosplenomegaly or hernias noted. Normal Bowel sounds Musculoskeletal: Symmetrical with no gross deformities  Pulses:  Normal pulses noted Extremities: No clubbing, cyanosis, edema or deformities noted Neurological: Alert oriented x 4, grossly nonfocal Psychological:  Alert and cooperative. Normal mood and affect  Assessment and Recommendations:   1. Personal history of adenomatous colon polyp initially diagnosed on colonoscopy in May 2013. Five-year interval surveillance colonoscopy in May 2018.  2. Irritable bowel syndrome. Continue Bentyl 10 mg tid ac and a high fiber diet. Avoid foods that trigger symptoms.   3. GERD. Continue antireflux measures including bed blocks and continue omeprazole 40 mg before evening meal as needed.   4. Schizoaffective disorder and PTSD likely contributing to IBS symptoms.

## 2014-04-14 ENCOUNTER — Ambulatory Visit (HOSPITAL_COMMUNITY): Payer: Self-pay | Admitting: Psychiatry

## 2014-04-21 ENCOUNTER — Ambulatory Visit (INDEPENDENT_AMBULATORY_CARE_PROVIDER_SITE_OTHER): Payer: 59 | Admitting: Psychiatry

## 2014-04-21 ENCOUNTER — Encounter (HOSPITAL_COMMUNITY): Payer: Self-pay | Admitting: Psychiatry

## 2014-04-21 VITALS — BP 132/75 | HR 67 | Ht 63.0 in | Wt 175.2 lb

## 2014-04-21 DIAGNOSIS — F251 Schizoaffective disorder, depressive type: Secondary | ICD-10-CM

## 2014-04-21 DIAGNOSIS — F4310 Post-traumatic stress disorder, unspecified: Secondary | ICD-10-CM

## 2014-04-21 DIAGNOSIS — F17200 Nicotine dependence, unspecified, uncomplicated: Secondary | ICD-10-CM

## 2014-04-21 MED ORDER — SERTRALINE HCL 100 MG PO TABS: 200.0000 mg | ORAL_TABLET | Freq: Every day | ORAL | Status: DC

## 2014-04-21 MED ORDER — TRAZODONE HCL 300 MG PO TABS: 300.0000 mg | ORAL_TABLET | Freq: Every day | ORAL | Status: DC

## 2014-04-21 MED ORDER — ZIPRASIDONE HCL 80 MG PO CAPS: 80.0000 mg | ORAL_CAPSULE | Freq: Two times a day (BID) | ORAL | Status: DC

## 2014-04-21 MED ORDER — BENZTROPINE MESYLATE 1 MG PO TABS: 1.0000 mg | ORAL_TABLET | Freq: Every day | ORAL | Status: DC
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
## 2014-04-28 ENCOUNTER — Telehealth (HOSPITAL_COMMUNITY): Payer: Self-pay

## 2014-04-28 NOTE — Telephone Encounter (Signed)
Request from [place] received requesting to change Trazadone to 150mg  2 qhs in place of 300mg  1 qhs.  Authorized by Dr. Doyne Keel and faxed back to pharmacy.

## 2014-06-30 ENCOUNTER — Ambulatory Visit (INDEPENDENT_AMBULATORY_CARE_PROVIDER_SITE_OTHER): Payer: 59 | Admitting: Psychiatry

## 2014-06-30 ENCOUNTER — Encounter (HOSPITAL_COMMUNITY): Payer: Self-pay | Admitting: Psychiatry

## 2014-06-30 VITALS — BP 134/80 | HR 76 | Ht 63.0 in | Wt 180.0 lb

## 2014-06-30 DIAGNOSIS — F17210 Nicotine dependence, cigarettes, uncomplicated: Secondary | ICD-10-CM | POA: Diagnosis not present

## 2014-06-30 DIAGNOSIS — F4310 Post-traumatic stress disorder, unspecified: Secondary | ICD-10-CM

## 2014-06-30 DIAGNOSIS — F251 Schizoaffective disorder, depressive type: Secondary | ICD-10-CM | POA: Diagnosis not present

## 2014-06-30 MED ORDER — TRAZODONE HCL 100 MG PO TABS: 350.0000 mg | ORAL_TABLET | Freq: Every day | ORAL | Status: DC

## 2014-06-30 MED ORDER — BENZTROPINE MESYLATE 1 MG PO TABS: 1.0000 mg | ORAL_TABLET | Freq: Every day | ORAL | Status: DC

## 2014-06-30 MED ORDER — SERTRALINE HCL 100 MG PO TABS: 200.0000 mg | ORAL_TABLET | Freq: Every day | ORAL | Status: DC

## 2014-06-30 MED ORDER — ZIPRASIDONE HCL 80 MG PO CAPS: 80.0000 mg | ORAL_CAPSULE | Freq: Two times a day (BID) | ORAL | Status: DC
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
## 2014-07-16 ENCOUNTER — Ambulatory Visit (HOSPITAL_COMMUNITY): Payer: Self-pay | Admitting: Psychiatry

## 2014-07-17 ENCOUNTER — Encounter (HOSPITAL_COMMUNITY): Payer: Self-pay | Admitting: *Deleted

## 2014-07-17 ENCOUNTER — Emergency Department (HOSPITAL_COMMUNITY)
Admission: EM | Admit: 2014-07-17 | Discharge: 2014-07-17 | Disposition: A | Discharge status: Home / Self Care | Payer: Medicare Other | Attending: Emergency Medicine | Admitting: Emergency Medicine

## 2014-07-17 DIAGNOSIS — M1990 Unspecified osteoarthritis, unspecified site: Secondary | ICD-10-CM | POA: Insufficient documentation

## 2014-07-17 DIAGNOSIS — Z79899 Other long term (current) drug therapy: Secondary | ICD-10-CM | POA: Insufficient documentation

## 2014-07-17 DIAGNOSIS — F259 Schizoaffective disorder, unspecified: Secondary | ICD-10-CM | POA: Diagnosis not present

## 2014-07-17 DIAGNOSIS — F329 Major depressive disorder, single episode, unspecified: Secondary | ICD-10-CM | POA: Insufficient documentation

## 2014-07-17 DIAGNOSIS — Z23 Encounter for immunization: Secondary | ICD-10-CM | POA: Diagnosis not present

## 2014-07-17 DIAGNOSIS — K589 Irritable bowel syndrome without diarrhea: Secondary | ICD-10-CM | POA: Insufficient documentation

## 2014-07-17 DIAGNOSIS — Z791 Long term (current) use of non-steroidal anti-inflammatories (NSAID): Secondary | ICD-10-CM | POA: Diagnosis not present

## 2014-07-17 DIAGNOSIS — Z86018 Personal history of other benign neoplasm: Secondary | ICD-10-CM | POA: Insufficient documentation

## 2014-07-17 DIAGNOSIS — Z720 Tobacco use: Secondary | ICD-10-CM | POA: Insufficient documentation

## 2014-07-17 DIAGNOSIS — Z8679 Personal history of other diseases of the circulatory system: Secondary | ICD-10-CM | POA: Diagnosis not present

## 2014-07-17 DIAGNOSIS — K219 Gastro-esophageal reflux disease without esophagitis: Secondary | ICD-10-CM | POA: Diagnosis not present

## 2014-07-17 DIAGNOSIS — Z880 Allergy status to penicillin: Secondary | ICD-10-CM | POA: Insufficient documentation

## 2014-07-17 MED ORDER — TETANUS-DIPHTH-ACELL PERTUSSIS 5-2.5-18.5 LF-MCG/0.5 IM SUSP
0.5000 mL | Freq: Once | INTRAMUSCULAR | Status: AC
  Administered 2014-07-17: 0.5 mL via INTRAMUSCULAR
  Filled 2014-07-17: qty 0.5

## 2014-07-17 NOTE — ED Provider Notes (Signed)
CSN: [id number]     Arrival date & time 07/17/14  1750 History   First MD Initiated Contact with Patient 07/17/14 1804    This chart was scribed for non-physician practitioner, Jeannett Senior, Page, working with Elnora Morrison, MD by Terressa Koyanagi, ED Scribe. This patient was seen in room TR07C/TR07C and the patient's care was started at 6:16 PM.  Chief Complaint  Patient presents with  . needs shots    The history is provided by the patient. No language interpreter was used.   PCP: Bartholome Bill, MD HPI Comments: [name] is a 59 y.o. female, with PMH noted below including daily tobacco use, who presents to the Emergency Department for whooping cough and DP shots. Pt reports that she was told by her son that she needs to get said vaccines before she visits her grandchild. Pt reports she is scheduled to leave for [place] to [place] her grandchild tomorrow. Pt reports she had a tetanus shot last year. Pt denies cough, fever, chills, congestion.   Past Medical History  Diagnosis Date  . Diverticulosis   . GERD (gastroesophageal reflux disease)   . Osteoarthritis   . Myocarditis 2009  . Schizoaffective disorder   . Depression   . Tubular adenoma of colon 08/2011  . IBS (irritable bowel syndrome)    Past Surgical History  Procedure Laterality Date  . Cholecystectomy     Family History  Problem Relation Age of Onset  . Suicidality Father   . Depression Father   . Suicidality Sister   . Depression Sister   . Suicidality Cousin   . Suicidality Other    History  Substance Use Topics  . Smoking status: Current Some Day Smoker -- 1.00 packs/day    Types: Cigarettes  . Smokeless tobacco: Never Used  . Alcohol Use: No   OB History    No data available     Review of Systems  Constitutional: Negative for fever and chills.  Neurological: Negative for speech difficulty.  Psychiatric/Behavioral: Negative for confusion.   Allergies  Penicillins and Sulfa antibiotics  Home  Medications   Prior to Admission medications   Medication Sig Start Date End Date Taking? Authorizing Provider  benztropine (COGENTIN) 1 MG tablet Take 1 tablet (1 mg total) by mouth daily. 06/30/14   [name], MD  cholecalciferol (VITAMIN D) 400 UNITS TABS tablet Take 400 Units by mouth daily.    Historical Provider, MD  diclofenac (VOLTAREN) 75 MG EC tablet Take 75 mg by mouth 2 (two) times daily.    Historical Provider, MD  dicyclomine (BENTYL) 10 MG capsule Take 1 capsule (10 mg total) by mouth 3 (three) times daily before meals. 01/07/14   Ladene Artist, MD  doxycycline (DORYX) 100 MG DR capsule Take 100 mg by mouth 2 (two) times daily.    Historical Provider, MD  omeprazole (PRILOSEC) 40 MG capsule Take 1 capsule (40 mg total) by mouth every morning. 01/07/14   Ladene Artist, MD  sertraline (ZOLOFT) 100 MG tablet Take 2 tablets (200 mg total) by mouth daily. 06/30/14   [name], MD  trazodone (DESYREL) 100 MG tablet Take 3.5 tablets (350 mg total) by mouth at bedtime. 06/30/14   [name], MD  Vitamins-Lipotropics (B-50 COMPLEX) TABS Take 1 tablet by mouth daily.    Historical Provider, MD  ziprasidone (GEODON) 80 MG capsule Take 1 capsule (80 mg total) by mouth 2 (two) times daily with a meal. 06/30/14   [name], MD  Triage Vitals: BP 127/73 mmHg  Pulse 73  Temp(Src) 98.3 F (36.8 C)  Resp 18  Ht 5\' 3"  (1.6 m)  Wt 180 lb (81.647 kg)  BMI 31.89 kg/m2  SpO2 94% Physical Exam  Constitutional: She is oriented to person, place, and time. She appears well-developed and well-nourished. No distress.  HENT:  Head: Normocephalic and atraumatic.  Eyes: Conjunctivae and EOM are normal.  Neck: Neck supple.  Cardiovascular: Normal rate.   Pulmonary/Chest: Effort normal. No respiratory distress.  Musculoskeletal: Normal range of motion.  Neurological: She is alert and oriented to person, place, and time.  Skin: Skin is warm and dry.  Psychiatric: She has a normal  mood and affect. Her behavior is normal.  Nursing note and vitals reviewed.   ED Course  Procedures (including critical care time) DIAGNOSTIC STUDIES: Oxygen Saturation is 94% on RA, adequate by my interpretation.    COORDINATION OF CARE: 6:19 PM-Discussed treatment plan with pt at bedside and pt agreed to plan.   Labs Review Labs Reviewed - No data to display  Imaging Review No results found.   EKG Interpretation None      MDM   Final diagnoses:  Need for Tdap vaccination    Patient is here to get her teeth Updated. Tetanus updated in emergency department. Discharged with follow-up as needed. No emergencies identified during this visit.   Filed Vitals:   07/17/14 1800  BP: 127/73  Pulse: 73  Temp: 98.3 F (36.8 C)  Resp: 18  Height: 5\' 3"  (1.6 m)  Weight: 180 lb (81.647 kg)  SpO2: 94%   I personally performed the services described in this documentation, which was scribed in my presence. The recorded information has been reviewed and is accurate.   Jeannett Senior, PA-C 07/17/14 1841  Elnora Morrison, MD 07/18/14 [phone]

## 2014-07-17 NOTE — ED Notes (Signed)
Pt her for Tdap booster to be able to go see her [place]

## 2014-07-17 NOTE — ED Notes (Signed)
The pt has a newborn grandchild in [place].  She was called 1r5 minutes ago bty her sion and was tokld she needed a whooping cough and  dpt shiot befire she can be ariound the baby.  She leaves tomorrow

## 2014-07-17 NOTE — Discharge Instructions (Signed)
Follow up as needed.  Td Vaccine (Tetanus and Diphtheria): What You Need to Know 1. Why get vaccinated? Tetanus  and diphtheria are very serious diseases. They are rare in the [place] today, but people who do become infected often have severe complications. Td vaccine is used to protect adolescents and adults from both of these diseases. Both tetanus and diphtheria are infections caused by bacteria. Diphtheria spreads from person to person through coughing or sneezing. Tetanus-causing bacteria enter the body through cuts, scratches, or wounds. TETANUS (Lockjaw) causes painful muscle tightening and stiffness, usually all over the body.  It can lead to tightening of muscles in the head and neck so you can't open your mouth, swallow, or sometimes even breathe. Tetanus kills about 1 out of every 5 people who are infected. DIPHTHERIA can cause a thick coating to form in the back of the throat.  It can lead to breathing problems, paralysis, heart failure, and death. Before vaccines, the [place] saw as many as 200,000 cases a year of diphtheria and hundreds of cases of tetanus. Since vaccination began, cases of both diseases have dropped by about 99%. 2. Td vaccine Td vaccine can protect adolescents and adults from tetanus and diphtheria. Td is usually given as a booster dose every 10 years but it can also be given earlier after a severe and dirty wound or burn. Your doctor can give you more information. Td may safely be given at the same time as other vaccines. 3. Some people should not get this vaccine  If you ever had a life-threatening allergic reaction after a dose of any tetanus or diphtheria containing vaccine, OR if you have a severe allergy to any part of this vaccine, you should not get Td. Tell your doctor if you have any severe allergies.  Talk to your doctor if you:  have epilepsy or another nervous system problem,  had severe pain or swelling after any vaccine containing  diphtheria or tetanus,  ever had Guillain Barr Syndrome (GBS),  aren't feeling well on the day the shot is scheduled. 4. Risks of a vaccine reaction With a vaccine, like any medicine, there is a chance of side effects. These are usually mild and go away on their own. Serious side effects are also possible, but are very rare. Most people who get Td vaccine do not have any problems with it. Mild Problems  following Td (Did not interfere with activities)  Pain where the shot was given (about 8 people in 10)  Redness or swelling where the shot was given (about 1 person in 3)  Mild fever (about 1 person in 15)  Headache or Tiredness (uncommon) Moderate Problems following Td (Interfered with activities, but did not require medical attention)  Fever over 102F (rare) Severe Problems  following Td (Unable to perform usual activities; required medical attention)  Swelling, severe pain, bleeding and/or redness in the arm where the shot was given (rare). Problems that could happen after any vaccine:  Brief fainting spells can happen after any medical procedure, including vaccination. Sitting or lying down for about 15 minutes can help prevent fainting, and injuries caused by a fall. Tell your doctor if you feel dizzy, or have vision changes or ringing in the ears.  Severe shoulder pain and reduced range of motion in the arm where a shot was given can happen, very rarely, after a vaccination.  Severe allergic reactions from a vaccine are very rare, estimated at less than 1 in a million doses.  If one were to occur, it would usually be within a few minutes to a few hours after the vaccination. 5. What if there is a serious reaction? What should I look for?  Look for anything that concerns you, such as signs of a severe allergic reaction, very high fever, or behavior changes. Signs of a severe allergic reaction can include hives, swelling of the face and throat, difficulty breathing, a fast  heartbeat, dizziness, and weakness. These would usually start a few minutes to a few hours after the vaccination. What should I do?  If you think it is a severe allergic reaction or other emergency that can't wait, call 9-1-1 or get the person to the nearest hospital. Otherwise, call your doctor.  Afterward, the reaction should be reported to the Vaccine Adverse Event Reporting System (VAERS). Your doctor might file this report, or you can do it yourself through the VAERS web site at www.vaers.SamedayNews.es, or by calling 579-861-9808. VAERS is only for reporting reactions. They do not give medical advice. 6. The National Vaccine Injury Compensation Program The [employer] Vaccine Injury Compensation Program (VICP) is a federal program that was created to compensate people who may have been injured by certain vaccines. Persons who believe they may have been injured by a vaccine can learn about the program and about filing a claim by calling 212-431-8047 or visiting the [place] website at GoldCloset.com.ee. 7. How can I learn more?  Ask your doctor.  Contact your local or state health department.  Contact the Centers for Disease Control and Prevention (CDC):  Call 930-274-1175 (1-800-CDC-INFO)  Visit CDC's website at http://hunter.com/ CDC Td Vaccine Interim VIS (05/14/12) Document Released: 01/22/2006 Document Revised: 08/11/2013 Document Reviewed: 07/09/2013 [employer] Patient Information 2015 [place], [place]. This information is not intended to replace advice given to you by your health care provider. Make sure you discuss any questions you have with your health care provider.

## 2014-10-01 ENCOUNTER — Ambulatory Visit (HOSPITAL_COMMUNITY): Payer: Self-pay | Admitting: Psychiatry

## 2014-10-06 ENCOUNTER — Telehealth: Payer: Self-pay | Admitting: Gastroenterology

## 2014-10-06 NOTE — Telephone Encounter (Signed)
Patient states that the dicyclomine that was prescribed helped control the cramping, but she is still having diarrhea.  She is scheduled for an office visit for 11/25/14.  She is notified I will put her on the wait list and call her if there is an earlier appt

## 2014-10-19 ENCOUNTER — Ambulatory Visit (HOSPITAL_COMMUNITY): Payer: Self-pay | Admitting: Psychiatry

## 2014-10-21 ENCOUNTER — Encounter (HOSPITAL_COMMUNITY): Payer: Self-pay | Admitting: Psychiatry

## 2014-10-21 ENCOUNTER — Ambulatory Visit (INDEPENDENT_AMBULATORY_CARE_PROVIDER_SITE_OTHER): Payer: 59 | Admitting: Psychiatry

## 2014-10-21 VITALS — BP 118/76 | HR 72 | Ht 63.0 in | Wt 172.2 lb

## 2014-10-21 DIAGNOSIS — F17200 Nicotine dependence, unspecified, uncomplicated: Secondary | ICD-10-CM

## 2014-10-21 DIAGNOSIS — F4310 Post-traumatic stress disorder, unspecified: Secondary | ICD-10-CM

## 2014-10-21 DIAGNOSIS — F251 Schizoaffective disorder, depressive type: Secondary | ICD-10-CM | POA: Diagnosis not present

## 2014-10-21 MED ORDER — BENZTROPINE MESYLATE 1 MG PO TABS: 1.0000 mg | ORAL_TABLET | Freq: Every day | ORAL | Status: DC

## 2014-10-21 MED ORDER — TRAZODONE HCL 150 MG PO TABS: 150.0000 mg | ORAL_TABLET | Freq: Every day | ORAL | Status: DC

## 2014-10-21 MED ORDER — SERTRALINE HCL 100 MG PO TABS: 200.0000 mg | ORAL_TABLET | Freq: Every day | ORAL | Status: DC

## 2014-10-21 MED ORDER — ZIPRASIDONE HCL 80 MG PO CAPS: 80.0000 mg | ORAL_CAPSULE | Freq: Two times a day (BID) | ORAL | Status: DC
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
## 2014-11-25 ENCOUNTER — Encounter: Payer: Self-pay | Admitting: Gastroenterology

## 2014-11-25 ENCOUNTER — Ambulatory Visit (INDEPENDENT_AMBULATORY_CARE_PROVIDER_SITE_OTHER): Payer: Medicare Other | Admitting: Gastroenterology

## 2014-11-25 VITALS — BP 122/76 | HR 66 | Ht 63.5 in | Wt 175.4 lb

## 2014-11-25 DIAGNOSIS — R197 Diarrhea, unspecified: Secondary | ICD-10-CM | POA: Diagnosis not present

## 2014-11-25 DIAGNOSIS — K589 Irritable bowel syndrome without diarrhea: Secondary | ICD-10-CM | POA: Diagnosis not present

## 2014-11-25 MED ORDER — DICYCLOMINE HCL 20 MG PO TABS: 20.0000 mg | ORAL_TABLET | Freq: Three times a day (TID) | ORAL | Status: DC

## 2014-11-25 NOTE — Patient Instructions (Signed)
Increase your Bentyl to 20 mg twice daily. We have sent a new prescription to your pharmacy.   You have been given a Fodmap diet to follow x 2 weeks. If you do not see an improvement in your symptoms, then you can start the probiotic Align x 2 weeks, or Restora x 2 weeks, or Florastor x 2 weeks.   Thank you for choosing me and [facility].  Pricilla Riffle. Dagoberto Ligas., MD., Marval Regal

## 2014-11-25 NOTE — Progress Notes (Signed)
    History of Present Illness: This is a 59 year old female with a history of IBS and alternating diarrhea and constipation. Recently she has been in a diarrhea phase with loose watery bowel movements following most meals-this is associated with bloating. He has received some benefit from dicyclomine.  Current Medications, Allergies, Past Medical History, Past Surgical History, Family History and Social History were reviewed in [employer] record.  Physical Exam: General: Well developed , well nourished, no acute distress Head: Normocephalic and atraumatic Eyes:  sclerae anicteric, EOMI Ears: Normal auditory acuity Mouth: No deformity or lesions Lungs: Clear throughout to auscultation Heart: Regular rate and rhythm; no murmurs, rubs or bruits Abdomen: Soft, non tender and non distended. No masses, hepatosplenomegaly or hernias noted. Normal Bowel sounds Musculoskeletal: Symmetrical with no gross deformities  Pulses:  Normal pulses noted Extremities: No clubbing, cyanosis, edema or deformities noted Neurological: Alert oriented x 4, grossly nonfocal Psychological:  Alert and cooperative. Normal mood and affect  Assessment and Recommendations:  1. IBS, alternating pattern now in diarrhea phase. Two-week trial of FODMAP diet and lactose free diet. If this does not improve her symptoms then a trial of several different probiotics. See patient instructions. Increase dicyclomine to 20 mg 3 times a day before meals. If symptoms not improved she is advised to call or return for further plans.  2. Personal history of adenomatous colon polyp initially diagnosed on colonoscopy in May 2013. Five-year interval surveillance colonoscopy in May 2018.  3. GERD. Continue antireflux measures including bed blocks and continue omeprazole 40 mg before evening meal as needed.   4. Schizoaffective disorder and PTSD likely contributing to IBS symptoms.

## 2015-01-28 ENCOUNTER — Ambulatory Visit (HOSPITAL_COMMUNITY): Payer: Self-pay | Admitting: Psychiatry

## 2015-02-04 ENCOUNTER — Ambulatory Visit (HOSPITAL_COMMUNITY): Payer: Self-pay | Admitting: Psychiatry

## 2015-02-16 ENCOUNTER — Telehealth: Payer: Self-pay | Admitting: Gastroenterology

## 2015-02-16 MED ORDER — OMEPRAZOLE 40 MG PO CPDR: 40.0000 mg | DELAYED_RELEASE_CAPSULE | Freq: Every morning | ORAL | Status: DC

## 2015-02-16 NOTE — Telephone Encounter (Signed)
Notified patient her prescription was sent.

## 2015-02-16 NOTE — Telephone Encounter (Signed)
Prescription sent to the pharmacy. Left a message for patient to return my call to notify her.

## 2015-03-09 ENCOUNTER — Encounter (HOSPITAL_COMMUNITY): Payer: Self-pay | Admitting: Psychiatry

## 2015-03-09 ENCOUNTER — Ambulatory Visit (INDEPENDENT_AMBULATORY_CARE_PROVIDER_SITE_OTHER): Payer: 59 | Admitting: Psychiatry

## 2015-03-09 VITALS — BP 136/84 | HR 78 | Ht 63.0 in | Wt 176.6 lb

## 2015-03-09 DIAGNOSIS — F4310 Post-traumatic stress disorder, unspecified: Secondary | ICD-10-CM | POA: Diagnosis not present

## 2015-03-09 DIAGNOSIS — F251 Schizoaffective disorder, depressive type: Secondary | ICD-10-CM

## 2015-03-09 MED ORDER — ZIPRASIDONE HCL 80 MG PO CAPS: 80.0000 mg | ORAL_CAPSULE | Freq: Two times a day (BID) | ORAL | Status: DC

## 2015-03-09 MED ORDER — TRAZODONE HCL 100 MG PO TABS: 200.0000 mg | ORAL_TABLET | Freq: Every evening | ORAL | Status: DC | PRN

## 2015-03-09 MED ORDER — BENZTROPINE MESYLATE 1 MG PO TABS: 1.0000 mg | ORAL_TABLET | Freq: Every day | ORAL | Status: DC

## 2015-03-09 MED ORDER — SERTRALINE HCL 100 MG PO TABS: 200.0000 mg | ORAL_TABLET | Freq: Every day | ORAL | Status: DC
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
## 2015-04-16 ENCOUNTER — Ambulatory Visit: Payer: Self-pay | Admitting: Gastroenterology

## 2015-05-11 ENCOUNTER — Ambulatory Visit (HOSPITAL_COMMUNITY): Payer: Self-pay | Admitting: Psychiatry

## 2015-06-01 ENCOUNTER — Ambulatory Visit (HOSPITAL_COMMUNITY): Payer: Self-pay | Admitting: Psychiatry

## 2015-06-01 ENCOUNTER — Telehealth (HOSPITAL_COMMUNITY): Payer: Self-pay

## 2015-06-01 DIAGNOSIS — F251 Schizoaffective disorder, depressive type: Secondary | ICD-10-CM

## 2015-06-01 MED ORDER — BENZTROPINE MESYLATE 1 MG PO TABS: 1.0000 mg | ORAL_TABLET | Freq: Every day | ORAL | Status: DC

## 2015-06-01 MED ORDER — TRAZODONE HCL 100 MG PO TABS: 200.0000 mg | ORAL_TABLET | Freq: Every evening | ORAL | Status: DC | PRN

## 2015-06-01 MED ORDER — SERTRALINE HCL 100 MG PO TABS: 200.0000 mg | ORAL_TABLET | Freq: Every day | ORAL | Status: DC

## 2015-06-01 MED ORDER — ZIPRASIDONE HCL 80 MG PO CAPS: 80.0000 mg | ORAL_CAPSULE | Freq: Two times a day (BID) | ORAL | Status: DC

## 2015-06-01 NOTE — Telephone Encounter (Signed)
Yes ok to refill 

## 2015-06-01 NOTE — Telephone Encounter (Signed)
Patient needs refill on all medications, she will run out within a couple of days. She had to reschedule her appointment due to a death in the family, she is rescheduled for 3/16. Okay to refill?

## 2015-06-10 ENCOUNTER — Telehealth: Payer: Self-pay | Admitting: Gastroenterology

## 2015-06-10 NOTE — Telephone Encounter (Signed)
Left message for patient to call back  

## 2015-06-15 NOTE — Telephone Encounter (Signed)
Left message for patient to call back  

## 2015-06-17 NOTE — Telephone Encounter (Signed)
No return call from the patient.   I will await a call from her 

## 2015-06-24 ENCOUNTER — Encounter (HOSPITAL_COMMUNITY): Payer: Self-pay | Admitting: Psychiatry

## 2015-06-24 ENCOUNTER — Ambulatory Visit (INDEPENDENT_AMBULATORY_CARE_PROVIDER_SITE_OTHER): Payer: 59 | Admitting: Psychiatry

## 2015-06-24 VITALS — BP 135/81 | HR 60 | Ht 63.0 in | Wt 174.8 lb

## 2015-06-24 DIAGNOSIS — Z79899 Other long term (current) drug therapy: Secondary | ICD-10-CM

## 2015-06-24 DIAGNOSIS — G4700 Insomnia, unspecified: Secondary | ICD-10-CM | POA: Diagnosis not present

## 2015-06-24 DIAGNOSIS — F251 Schizoaffective disorder, depressive type: Secondary | ICD-10-CM | POA: Diagnosis not present

## 2015-06-24 DIAGNOSIS — F4310 Post-traumatic stress disorder, unspecified: Secondary | ICD-10-CM | POA: Diagnosis not present

## 2015-06-24 MED ORDER — TRAZODONE HCL 100 MG PO TABS: 250.0000 mg | ORAL_TABLET | Freq: Every evening | ORAL | Status: DC | PRN

## 2015-06-24 MED ORDER — BENZTROPINE MESYLATE 1 MG PO TABS: 1.0000 mg | ORAL_TABLET | Freq: Every day | ORAL | Status: DC

## 2015-06-24 MED ORDER — ZIPRASIDONE HCL 80 MG PO CAPS: 80.0000 mg | ORAL_CAPSULE | Freq: Two times a day (BID) | ORAL | Status: DC

## 2015-06-24 MED ORDER — SERTRALINE HCL 100 MG PO TABS: 200.0000 mg | ORAL_TABLET | Freq: Every day | ORAL | Status: DC
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
## 2015-08-31 ENCOUNTER — Ambulatory Visit (INDEPENDENT_AMBULATORY_CARE_PROVIDER_SITE_OTHER): Payer: 59 | Admitting: Psychiatry

## 2015-08-31 ENCOUNTER — Encounter (HOSPITAL_COMMUNITY): Payer: Self-pay | Admitting: Psychiatry

## 2015-08-31 VITALS — BP 130/82 | HR 72 | Ht 63.0 in | Wt 171.0 lb

## 2015-08-31 DIAGNOSIS — G4700 Insomnia, unspecified: Secondary | ICD-10-CM | POA: Diagnosis not present

## 2015-08-31 DIAGNOSIS — F4310 Post-traumatic stress disorder, unspecified: Secondary | ICD-10-CM | POA: Diagnosis not present

## 2015-08-31 DIAGNOSIS — F251 Schizoaffective disorder, depressive type: Secondary | ICD-10-CM

## 2015-08-31 MED ORDER — SERTRALINE HCL 100 MG PO TABS: 200.0000 mg | ORAL_TABLET | Freq: Every day | ORAL | Status: DC

## 2015-08-31 MED ORDER — BENZTROPINE MESYLATE 1 MG PO TABS: 1.0000 mg | ORAL_TABLET | Freq: Every day | ORAL | Status: DC

## 2015-08-31 MED ORDER — ZIPRASIDONE HCL 80 MG PO CAPS: 80.0000 mg | ORAL_CAPSULE | Freq: Two times a day (BID) | ORAL | Status: DC

## 2015-08-31 MED ORDER — TRAZODONE HCL 100 MG PO TABS: 250.0000 mg | ORAL_TABLET | Freq: Every evening | ORAL | Status: DC | PRN
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
## 2015-09-10 ENCOUNTER — Telehealth: Payer: Self-pay | Admitting: Gastroenterology

## 2015-09-10 NOTE — Telephone Encounter (Signed)
I need records from PCP that show the stool pathogen that was treated with all stool studies and blood work done over the past few. Need PCPs office notes as well. She likely is seeing mucous in her stool.

## 2015-09-10 NOTE — Telephone Encounter (Signed)
Patient reports that she was treated x 3 with Albenza by her primary care and her last stool specimen was negative.  She reports that she sees "larva" in her stool and can "feel movement in her stomach , like you do when you are pregnant" Patient is advised that she should return to her primary care and discuss with them the "larva" in her stool.  She reports that they won't test her again.   She is currently scheduled for 10/20/15 with Dr. Fuller Plan.  She is advised that she will keep this appt for now. I have asked her to please contact her primary care for notes on her recent worm treatment.   Dr. Fuller Plan would do you want to order O&P - apparently her primary care won't?

## 2015-09-14 NOTE — Telephone Encounter (Signed)
Records from PCP are on your desk from Care everywhere

## 2015-09-28 ENCOUNTER — Other Ambulatory Visit: Payer: Self-pay

## 2015-09-28 MED ORDER — DICYCLOMINE HCL 20 MG PO TABS: 20.0000 mg | ORAL_TABLET | Freq: Three times a day (TID) | ORAL | Status: DC

## 2015-09-30 ENCOUNTER — Other Ambulatory Visit: Payer: Self-pay

## 2015-10-20 ENCOUNTER — Encounter: Payer: Self-pay | Admitting: Gastroenterology

## 2015-10-20 ENCOUNTER — Ambulatory Visit (INDEPENDENT_AMBULATORY_CARE_PROVIDER_SITE_OTHER): Payer: Medicare Other | Admitting: Gastroenterology

## 2015-10-20 VITALS — BP 128/80 | HR 64 | Ht 62.25 in | Wt 169.4 lb

## 2015-10-20 DIAGNOSIS — K641 Second degree hemorrhoids: Secondary | ICD-10-CM

## 2015-10-20 DIAGNOSIS — Z86010 Personal history of colonic polyps: Secondary | ICD-10-CM

## 2015-10-20 DIAGNOSIS — K589 Irritable bowel syndrome without diarrhea: Secondary | ICD-10-CM | POA: Diagnosis not present

## 2015-10-20 DIAGNOSIS — K648 Other hemorrhoids: Secondary | ICD-10-CM

## 2015-10-20 DIAGNOSIS — K5900 Constipation, unspecified: Secondary | ICD-10-CM | POA: Diagnosis not present

## 2015-10-20 NOTE — Patient Instructions (Signed)
Your hemorrhoid banding appointment is on 12/01/15 3:00pm with Dr. Carlean Purl.   Take Imodium twice daily for when you have diarrhea symptoms. If you are having constipation issues then please take Miralax twice daily.  Normal BMI (Body Mass Index- based on height and weight) is between 19 and 25. Your BMI today is Body mass index is 30.74 kg/(m^2). Marland Kitchen Please consider follow up  regarding your BMI with your Primary Care Provider.  Thank you for choosing me and [facility].  Pricilla Riffle. Dagoberto Ligas., MD., [facility]

## 2015-10-20 NOTE — Progress Notes (Signed)
    History of Present Illness: This is a 60 year old female with a history of IBS. She is concerned that she may have intestinal parasites as she has noted some white areas in her stools on a few occasions that she thinks were worms. She was treated by her PCP twice in October and March for anal itching for possible pinworms with albendazole. Patient has IBS-D however since beginning Garcinia Cambogia recently she has noted mild constipation. She notes frequent bloating and abdominal discomfort. She notes frequently prolapsing hemorrhoids with bowel movements that manually reduce without difficulty. Colonoscopy and anoscopy performed in 08/2011 by Dr. Malachi Carl at [facility] showed hemorrhoids, diverticulosis and 1 adenomatous colon polyp. She requests evaluation for hemorrhoidal banding.   Current Medications, Allergies, Past Medical History, Past Surgical History, Family History and Social History were reviewed in [employer] record.  Physical Exam: General: Well developed, well nourished, no acute distress Head: Normocephalic and atraumatic Eyes:  sclerae anicteric, EOMI Ears: Normal auditory acuity Mouth: No deformity or lesions Lungs: Clear throughout to auscultation Heart: Regular rate and rhythm; no murmurs, rubs or bruits Abdomen: Soft, non tender and non distended. No masses, hepatosplenomegaly or hernias noted. Normal Bowel sounds Musculoskeletal: Symmetrical with no gross deformities  Pulses:  Normal pulses noted Extremities: No clubbing, cyanosis, edema or deformities noted Neurological: Alert oriented x 4, grossly nonfocal Psychological:  Alert and cooperative. Normal mood and affect  Assessment and Recommendations:  1. IBS, previously with a diarrhea predominant pattern now with constipation possibly related to Garcinia Cambogia. Has frequent abdominal discomfort. I suspect she is seeing mucus in her stool and not parasites however  will obtain stool for ova and parasites. Begin dicyclomine 20 mg po tid ac prn. Miralax daily to twice daily titrated for adequate bowel movements. If this is not adequate to address constipation consider discontinuing Garcinia Cambogia.  2. Prolapsing hemorrhoids by history and prior anoscopy/colonoscopy. Referral for consideration of hemorrhoidal banding.   3. Personal history of adenomatous colon polyp initially diagnosed on colonoscopy in May 2013. Five-year interval surveillance colonoscopy in May 2018.  4. Schizoaffective disorder and PTSD likely contributing to IBS symptoms and parasite phobia.   5. GERD. Continue omeprazole 40 mg daily and standard antireflux measures.

## 2015-11-04 ENCOUNTER — Ambulatory Visit (HOSPITAL_COMMUNITY): Payer: Self-pay | Admitting: Psychiatry

## 2015-11-12 ENCOUNTER — Other Ambulatory Visit: Payer: Self-pay | Admitting: Gastroenterology

## 2015-11-17 ENCOUNTER — Other Ambulatory Visit: Payer: Medicare Other

## 2015-11-17 ENCOUNTER — Other Ambulatory Visit: Payer: Self-pay

## 2015-11-17 DIAGNOSIS — R197 Diarrhea, unspecified: Secondary | ICD-10-CM

## 2015-11-19 ENCOUNTER — Other Ambulatory Visit (HOSPITAL_COMMUNITY): Payer: Self-pay | Admitting: Psychiatry

## 2015-11-19 DIAGNOSIS — F251 Schizoaffective disorder, depressive type: Secondary | ICD-10-CM

## 2015-11-19 DIAGNOSIS — G4700 Insomnia, unspecified: Secondary | ICD-10-CM

## 2015-11-19 LAB — OVA AND PARASITE EXAMINATION: OP: NONE SEEN

## 2015-11-25 ENCOUNTER — Other Ambulatory Visit (HOSPITAL_COMMUNITY): Payer: Self-pay | Admitting: Psychiatry

## 2015-11-25 DIAGNOSIS — F251 Schizoaffective disorder, depressive type: Secondary | ICD-10-CM

## 2015-11-25 DIAGNOSIS — F4310 Post-traumatic stress disorder, unspecified: Secondary | ICD-10-CM

## 2015-12-01 ENCOUNTER — Encounter: Payer: Self-pay | Admitting: Internal Medicine

## 2015-12-01 ENCOUNTER — Ambulatory Visit (INDEPENDENT_AMBULATORY_CARE_PROVIDER_SITE_OTHER): Payer: Medicare Other | Admitting: Internal Medicine

## 2015-12-01 DIAGNOSIS — K648 Other hemorrhoids: Secondary | ICD-10-CM | POA: Diagnosis not present

## 2015-12-01 DIAGNOSIS — K642 Third degree hemorrhoids: Secondary | ICD-10-CM

## 2015-12-01 HISTORY — DX: Third degree hemorrhoids: K64.2

## 2015-12-01 NOTE — Patient Instructions (Addendum)
HEMORRHOID BANDING PROCEDURE    FOLLOW-UP CARE   1. The procedure you have had should have been relatively painless since the banding of the area involved does not have nerve endings and there is no pain sensation.  The rubber band cuts off the blood supply to the hemorrhoid and the band may fall off as soon as 48 hours after the banding (the band may occasionally be seen in the toilet bowl following a bowel movement). You may notice a temporary feeling of fullness in the rectum which should respond adequately to plain Tylenol or Motrin.  2. Following the banding, avoid strenuous exercise that evening and resume full activity the next day.  A sitz bath (soaking in a warm tub) or bidet is soothing, and can be useful for cleansing the area after bowel movements.     3. To avoid constipation, take two tablespoons of natural wheat bran, natural oat bran, flax, Benefiber or any over the counter fiber supplement and increase your water intake to 7-8 glasses daily.    4. Unless you have been prescribed anorectal medication, do not put anything inside your rectum for two weeks: No suppositories, enemas, fingers, etc.  5. Occasionally, you may have more bleeding than usual after the banding procedure.  This is often from the untreated hemorrhoids rather than the treated one.  Don't be concerned if there is a tablespoon or so of blood.  If there is more blood than this, lie flat with your bottom higher than your head and apply an ice pack to the area. If the bleeding does not stop within a half an hour or if you feel faint, call our office at [phone] or go to the emergency room.  6. Problems are not common; however, if there is a substantial amount of bleeding, severe pain, chills, fever or difficulty passing urine (very rare) or other problems, you should call us at [phone] or report to the nearest emergency room.  7. Do not stay seated continuously for more than 2-3 hours for a day or two  after the procedure.  Tighten your buttock muscles 10-15 times every two hours and take 10-15 deep breaths every 1-2 hours.  Do not spend more than a few minutes on the toilet if you cannot empty your bowel; instead re-visit the toilet at a later time.    Next hemorrhoid banding  with Dr Carlean Purl on 12/16/15 at 3pm     I appreciate the opportunity to care for you. Silvano Rusk, MD, [facility]

## 2015-12-01 NOTE — Assessment & Plan Note (Signed)
RP banded RTC 9/7 ? Ondansetron for IBS

## 2015-12-04 ENCOUNTER — Other Ambulatory Visit (HOSPITAL_COMMUNITY): Payer: Self-pay | Admitting: Psychiatry

## 2015-12-04 DIAGNOSIS — F251 Schizoaffective disorder, depressive type: Secondary | ICD-10-CM

## 2015-12-06 NOTE — Progress Notes (Signed)
   Presents with rectal bleeding and anal irritation problems and suspected hemorrhoids.  Anoscopy was performed with the patient in the left lateral decubitus position while a chaperone was present and revealed Gr 2 internal hemorrhoids all positions  PROCEDURE NOTE: The patient presents with symptomatic grade 2  hemorrhoids, requesting rubber band ligation of his/her hemorrhoidal disease.  All risks, benefits and alternative forms of therapy were described and informed consent was obtained.   The anorectum was pre-medicated with lidocaine 5% and NTG 0.125% The decision was made to band the RP internal hemorrhoid, and the [place] was used to perform band ligation without complication.  Digital anorectal examination was then performed to assure proper positioning of the band, and to adjust the banded tissue as required.  The patient was discharged home without pain or other issues.  Dietary and behavioral recommendations were given and along with follow-up instructions.      The patient will return in about 2-3 weeks for  follow-up and possible additional banding as required. No complications were encountered and the patient tolerated the procedure well.  I appreciate the opportunity to care for this patient.   LY:[id number], Dola Factor, MD

## 2015-12-16 ENCOUNTER — Encounter: Payer: Self-pay | Admitting: Internal Medicine

## 2015-12-26 ENCOUNTER — Other Ambulatory Visit (HOSPITAL_COMMUNITY): Payer: Self-pay | Admitting: Psychiatry

## 2015-12-26 DIAGNOSIS — F251 Schizoaffective disorder, depressive type: Secondary | ICD-10-CM

## 2016-01-10 ENCOUNTER — Other Ambulatory Visit (HOSPITAL_COMMUNITY): Payer: Self-pay | Admitting: Psychiatry

## 2016-01-10 DIAGNOSIS — F251 Schizoaffective disorder, depressive type: Secondary | ICD-10-CM

## 2016-01-13 ENCOUNTER — Ambulatory Visit (INDEPENDENT_AMBULATORY_CARE_PROVIDER_SITE_OTHER): Payer: 59 | Admitting: Psychiatry

## 2016-01-13 ENCOUNTER — Encounter (HOSPITAL_COMMUNITY): Payer: Self-pay | Admitting: Psychiatry

## 2016-01-13 VITALS — BP 124/72 | HR 64 | Ht 63.0 in | Wt 163.8 lb

## 2016-01-13 DIAGNOSIS — F5105 Insomnia due to other mental disorder: Secondary | ICD-10-CM

## 2016-01-13 DIAGNOSIS — F251 Schizoaffective disorder, depressive type: Secondary | ICD-10-CM | POA: Diagnosis not present

## 2016-01-13 DIAGNOSIS — F4310 Post-traumatic stress disorder, unspecified: Secondary | ICD-10-CM

## 2016-01-13 DIAGNOSIS — F99 Mental disorder, not otherwise specified: Secondary | ICD-10-CM

## 2016-01-13 MED ORDER — ZIPRASIDONE HCL 80 MG PO CAPS: ORAL_CAPSULE | ORAL | 3 refills | Status: DC

## 2016-01-13 MED ORDER — SERTRALINE HCL 100 MG PO TABS: 200.0000 mg | ORAL_TABLET | Freq: Every day | ORAL | 3 refills | Status: DC

## 2016-01-13 MED ORDER — BENZTROPINE MESYLATE 1 MG PO TABS: 1.0000 mg | ORAL_TABLET | Freq: Every day | ORAL | 3 refills | Status: DC
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
## 2016-01-14 ENCOUNTER — Ambulatory Visit (INDEPENDENT_AMBULATORY_CARE_PROVIDER_SITE_OTHER): Payer: Medicare Other | Admitting: Internal Medicine

## 2016-01-14 ENCOUNTER — Encounter: Payer: Self-pay | Admitting: Internal Medicine

## 2016-01-14 VITALS — BP 126/74 | HR 68 | Ht 63.0 in | Wt 163.0 lb

## 2016-01-14 DIAGNOSIS — K580 Irritable bowel syndrome with diarrhea: Secondary | ICD-10-CM

## 2016-01-14 DIAGNOSIS — K642 Third degree hemorrhoids: Secondary | ICD-10-CM | POA: Diagnosis not present

## 2016-01-14 DIAGNOSIS — R110 Nausea: Secondary | ICD-10-CM

## 2016-01-14 MED ORDER — ONDANSETRON 4 MG PO TBDP: 4.0000 mg | ORAL_TABLET | Freq: Three times a day (TID) | ORAL | 0 refills | Status: DC | PRN

## 2016-01-14 NOTE — Patient Instructions (Addendum)
  HEMORRHOID BANDING PROCEDURE    FOLLOW-UP CARE   1. The procedure you have had should have been relatively painless since the banding of the area involved does not have nerve endings and there is no pain sensation.  The rubber band cuts off the blood supply to the hemorrhoid and the band may fall off as soon as 48 hours after the banding (the band may occasionally be seen in the toilet bowl following a bowel movement). You may notice a temporary feeling of fullness in the rectum which should respond adequately to plain Tylenol or Motrin.  2. Following the banding, avoid strenuous exercise that evening and resume full activity the next day.  A sitz bath (soaking in a warm tub) or bidet is soothing, and can be useful for cleansing the area after bowel movements.     3. To avoid constipation, take two tablespoons of natural wheat bran, natural oat bran, flax, Benefiber or any over the counter fiber supplement and increase your water intake to 7-8 glasses daily.    4. Unless you have been prescribed anorectal medication, do not put anything inside your rectum for two weeks: No suppositories, enemas, fingers, etc.  5. Occasionally, you may have more bleeding than usual after the banding procedure.  This is often from the untreated hemorrhoids rather than the treated one.  Don't be concerned if there is a tablespoon or so of blood.  If there is more blood than this, lie flat with your bottom higher than your head and apply an ice pack to the area. If the bleeding does not stop within a half an hour or if you feel faint, call our office at [phone] or go to the emergency room.  6. Problems are not common; however, if there is a substantial amount of bleeding, severe pain, chills, fever or difficulty passing urine (very rare) or other problems, you should call us at [phone] or report to the nearest emergency room.  7. Do not stay seated continuously for more than 2-3 hours for a day or  two after the procedure.  Tighten your buttock muscles 10-15 times every two hours and take 10-15 deep breaths every 1-2 hours.  Do not spend more than a few minutes on the toilet if you cannot empty your bowel; instead re-visit the toilet at a later time.    Stop your dicyclomine.    We have sent the following medications to your pharmacy for you to pick up at your convenience: Generic zofran     I appreciate the opportunity to care for you. Silvano Rusk, MD, [facility]

## 2016-01-14 NOTE — Assessment & Plan Note (Signed)
RA and LL banded 

## 2016-01-14 NOTE — Progress Notes (Signed)
   Better - less discomfort after first banding  PROCEDURE NOTE: The patient presents with symptomatic grade 2-03 hemorrhoids, requesting rubber band ligation of his/her hemorrhoidal disease.  All risks, benefits and alternative forms of therapy were described and informed consent was obtained.   The anorectum was pre-medicated with 0.125% NTG and 5% lidoacine The decision was made to band the RA and LL internal hemorrhoids, and the [place] was used to perform band ligation without complication.  Digital anorectal examination was then performed to assure proper positioning of the band, and to adjust the banded tissue as required.  The patient was discharged home without pain or other issues.  Dietary and behavioral recommendations were given and along with follow-up instructions.     The patient will return in prn for  follow-up and possible additional banding as required. No complications were encountered and the patient tolerated the procedure well.   She is c/o that dicyclomine has not helped her abdominal pain, nausea and diarrhea. She remains concerned that she has worms. She can feel something moving inside of her when she lies down at night.    Other issue is c/o nausea and diarrhea   BP 126/74   Pulse 68   Ht 5\' 3"  (1.6 m)   Wt 163 lb (73.9 kg)   BMI 28.87 kg/m   Will stop dicyclomine try ondansetron for nausea and diarrhea. Reassured today re: IBS and mucous production - she remains concerned about worms - ? Delusional parasitosis Return to Dr. Fuller Plan for f/u  Cc:Bartholome Bill, MD Dr. Lucio Edward

## 2016-01-27 ENCOUNTER — Telehealth (HOSPITAL_COMMUNITY): Payer: Self-pay

## 2016-01-27 DIAGNOSIS — F5102 Adjustment insomnia: Secondary | ICD-10-CM

## 2016-01-27 DIAGNOSIS — F251 Schizoaffective disorder, depressive type: Secondary | ICD-10-CM

## 2016-01-27 MED ORDER — TRAZODONE HCL 100 MG PO TABS: 150.0000 mg | ORAL_TABLET | Freq: Every day | ORAL | 2 refills | Status: DC

## 2016-01-27 NOTE — Telephone Encounter (Signed)
Yes enough to get to scheduled appt

## 2016-01-27 NOTE — Telephone Encounter (Signed)
Patient is calling for a refill on Trazodone, she says that she had stopped taking it for a few months, but started back this week. She would like a refill so that she can continue to take. Please review and advise, thank you

## 2016-01-27 NOTE — Telephone Encounter (Signed)
Sent in the Trazodone and called patient to let her know

## 2016-02-17 ENCOUNTER — Other Ambulatory Visit: Payer: Self-pay | Admitting: Gastroenterology

## 2016-04-20 ENCOUNTER — Ambulatory Visit (INDEPENDENT_AMBULATORY_CARE_PROVIDER_SITE_OTHER): Payer: 59 | Admitting: Psychiatry

## 2016-04-20 ENCOUNTER — Encounter (HOSPITAL_COMMUNITY): Payer: Self-pay | Admitting: Psychiatry

## 2016-04-20 DIAGNOSIS — F5102 Adjustment insomnia: Secondary | ICD-10-CM | POA: Diagnosis not present

## 2016-04-20 DIAGNOSIS — Z818 Family history of other mental and behavioral disorders: Secondary | ICD-10-CM

## 2016-04-20 DIAGNOSIS — F4310 Post-traumatic stress disorder, unspecified: Secondary | ICD-10-CM

## 2016-04-20 DIAGNOSIS — F251 Schizoaffective disorder, depressive type: Secondary | ICD-10-CM | POA: Diagnosis not present

## 2016-04-20 DIAGNOSIS — Z79899 Other long term (current) drug therapy: Secondary | ICD-10-CM

## 2016-04-20 MED ORDER — SERTRALINE HCL 100 MG PO TABS: 200.0000 mg | ORAL_TABLET | Freq: Every day | ORAL | 3 refills | Status: DC

## 2016-04-20 MED ORDER — TRAZODONE HCL 100 MG PO TABS: 150.0000 mg | ORAL_TABLET | Freq: Every day | ORAL | 2 refills | Status: DC

## 2016-04-20 MED ORDER — BENZTROPINE MESYLATE 1 MG PO TABS: 1.0000 mg | ORAL_TABLET | Freq: Every day | ORAL | 3 refills | Status: DC

## 2016-04-20 MED ORDER — ZIPRASIDONE HCL 80 MG PO CAPS: ORAL_CAPSULE | ORAL | 3 refills | Status: DC
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
## 2016-07-05 ENCOUNTER — Encounter: Payer: Self-pay | Admitting: Gastroenterology

## 2016-07-20 ENCOUNTER — Encounter (HOSPITAL_COMMUNITY): Payer: Self-pay | Admitting: Psychiatry

## 2016-07-20 ENCOUNTER — Ambulatory Visit (INDEPENDENT_AMBULATORY_CARE_PROVIDER_SITE_OTHER): Payer: 59 | Admitting: Psychiatry

## 2016-07-20 DIAGNOSIS — F5102 Adjustment insomnia: Secondary | ICD-10-CM

## 2016-07-20 DIAGNOSIS — F251 Schizoaffective disorder, depressive type: Secondary | ICD-10-CM

## 2016-07-20 DIAGNOSIS — Z818 Family history of other mental and behavioral disorders: Secondary | ICD-10-CM | POA: Diagnosis not present

## 2016-07-20 DIAGNOSIS — Z79899 Other long term (current) drug therapy: Secondary | ICD-10-CM

## 2016-07-20 DIAGNOSIS — F4310 Post-traumatic stress disorder, unspecified: Secondary | ICD-10-CM | POA: Diagnosis not present

## 2016-07-20 DIAGNOSIS — F17210 Nicotine dependence, cigarettes, uncomplicated: Secondary | ICD-10-CM

## 2016-07-20 MED ORDER — BENZTROPINE MESYLATE 1 MG PO TABS: 1.0000 mg | ORAL_TABLET | Freq: Every day | ORAL | 3 refills | Status: DC

## 2016-07-20 MED ORDER — TRAZODONE HCL 100 MG PO TABS: 200.0000 mg | ORAL_TABLET | Freq: Every day | ORAL | 3 refills | Status: DC

## 2016-07-20 MED ORDER — SERTRALINE HCL 100 MG PO TABS: 200.0000 mg | ORAL_TABLET | Freq: Every day | ORAL | 3 refills | Status: DC

## 2016-07-20 MED ORDER — ZIPRASIDONE HCL 80 MG PO CAPS
ORAL_CAPSULE | ORAL | 3 refills | Status: DC
Start: 2016-07-20 — End: 2016-10-26

## 2016-07-20 NOTE — Progress Notes (Signed)
BH MD/PA/NP OP Progress Note  07/20/2016 2:44 PM JADENE STEMMER  MRN:  [id number]  Chief Complaint:  Chief Complaint    Follow-up      HPI: Pt's mom moved in a month ago. Her mom's husband passed and had no where to go. It is going ok and pt is standing up herself. Pt is anxious about her future with her mother their.   She has not been feeling depressed. Denies anhedonia, worthlessness and hopelessness. Denies SI/HI.  Sleep remains variable. Some nights she sleeps well but will still wake up multiple times a night. Other nights she is unable to sleep at all even with Trazodone. States Trazodone works well most nights. Energy is low.   Denies AVH. She does have some on/off paranoia.   PTSD is very mild. She still dreams about her ex husband every night. The last time she had any contact with him was in 2010. She has rare intrusive memories.  Taking meds as prescribed and denies SE.     Visit Diagnosis:    ICD-9-CM ICD-10-CM   1. Schizoaffective disorder, depressive type ([place]) 295.70 F25.1 ziprasidone (GEODON) 80 MG capsule     traZODone (DESYREL) 100 MG tablet     sertraline (ZOLOFT) 100 MG tablet     benztropine (COGENTIN) 1 MG tablet  2. Adjustment insomnia 307.41 F51.02 traZODone (DESYREL) 100 MG tablet  3. PTSD (post-traumatic stress disorder) 309.81 F43.10 sertraline (ZOLOFT) 100 MG tablet    Past Psychiatric History:  Anxiety: Yes Bipolar Disorder: Yes Depression: Yes Mania: Yes Psychosis: Yes Schizophrenia: Yes Personality Disorder: No Hospitalization for psychiatric illness: Yes History of Electroconvulsive Shock Therapy: No Prior Suicide Attempts: Yes  Past Medical History:  Past Medical History:  Diagnosis Date  . Depression   . Diverticulosis   . GERD (gastroesophageal reflux disease)   . IBS (irritable bowel syndrome)   . Internal hemorrhoid    2nd degree  . Myocarditis ([place]) 2009  . Osteoarthritis   . Prolapsed internal hemorrhoids, grade 3  12/01/2015  . Schizoaffective disorder ([place])   . Tubular adenoma of colon 08/2011    Past Surgical History:  Procedure Laterality Date  . CHOLECYSTECTOMY    . HEMORRHOID BANDING      Family Psychiatric and Medical Hx: Family History  Problem Relation Age of Onset  . Suicidality Father   . Depression Father   . Suicidality Sister   . Depression Sister   . Suicidality Cousin   . Suicidality Other     Social History:  Social History   Social History  . Marital status: Divorced    Spouse name: N/A  . Number of children: 2  . Years of education: N/A   Occupational History  . Disabled    Social History Main Topics  . Smoking status: Current Some Day Smoker    Packs/day: 1.00    Years: 38.00    Types: Cigarettes  . Smokeless tobacco: Never Used  . Alcohol use No  . Drug use: No  . Sexual activity: Not Currently   Other Topics Concern  . None   Social History Narrative  . None    Allergies:  Allergies  Allergen Reactions  . Penicillins   . Sulfa Antibiotics Rash    Metabolic Disorder Labs: No results found for: HGBA1C, MPG No results found for: PROLACTIN No results found for: CHOL, TRIG, HDL, CHOLHDL, VLDL, LDLCALC   Current Medications: Current Outpatient Prescriptions  Medication Sig Dispense Refill  . benztropine (COGENTIN) 1  MG tablet Take 1 tablet (1 mg total) by mouth daily. 30 tablet 3  . cholecalciferol (VITAMIN D) 400 UNITS TABS tablet Take 400 Units by mouth daily.    . Cyclobenzaprine HCl (FLEXERIL PO) Take 5 mg by mouth 3 (three) times daily as needed.    Marland Kitchen omeprazole (PRILOSEC) 40 MG capsule take 1 capsule by mouth every morning 30 capsule 5  . ondansetron (ZOFRAN-ODT) 4 MG disintegrating tablet Take 1 tablet (4 mg total) by mouth every 8 (eight) hours as needed for nausea or vomiting (and diarrhea). 60 tablet 0  . sertraline (ZOLOFT) 100 MG tablet Take 2 tablets (200 mg total) by mouth daily. 60 tablet 3  . traMADol (ULTRAM) 50 MG tablet Take  by mouth every 12 (twelve) hours as needed.    . traZODone (DESYREL) 100 MG tablet Take 1.5 tablets (150 mg total) by mouth at bedtime. 75 tablet 2  . Vitamins-Lipotropics (B-50 COMPLEX) TABS Take 1 tablet by mouth daily.    . ziprasidone (GEODON) 80 MG capsule take 1 capsule by mouth twice a day WITH MEALS 60 capsule 3  . clindamycin (CLEOCIN) 75 MG capsule Take 75 mg by mouth 3 (three) times daily.    Marland Kitchen doxycycline (DORYX) 100 MG DR capsule Take 100 mg by mouth 2 (two) times daily.    Marland Kitchen GARCINIA CAMBOGIA-CHROMIUM PO Take 1 tablet by mouth 2 (two) times daily.     No current facility-administered medications for this visit.      Musculoskeletal: Strength & Muscle Tone: within normal limits Gait & Station: unsteady Patient leans: N/A  Psychiatric Specialty Exam: Review of Systems  Musculoskeletal: Positive for back pain and joint pain. Negative for falls and neck pain.  Neurological: Positive for sensory change. Negative for dizziness, seizures, loss of consciousness and headaches.  Psychiatric/Behavioral: Negative for depression, hallucinations, substance abuse and suicidal ideas. The patient is nervous/anxious and has insomnia.     Blood pressure 112/74, pulse 73, height 5\' 3"  (1.6 m), weight 163 lb (73.9 kg).Body mass index is 28.87 kg/m.  General Appearance: Fairly Groomed  Eye Contact:  Good  Speech:  Clear and Coherent and Normal Rate  Volume:  Normal  Mood:  Anxious  Affect:  Full Range  Thought Process:  Goal Directed and Descriptions of Associations: Intact  Orientation:  Full (Time, Place, and Person)  Thought Content: Logical   Suicidal Thoughts:  No  Homicidal Thoughts:  No  Memory:  Immediate;   Good Recent;   Good Remote;   Good  Judgement:  Good  Insight:  Good  Psychomotor Activity:  Normal  Concentration:  Concentration: Good and Attention Span: Good  Recall:  Good  Fund of Knowledge: Good  Language: Good  Akathisia:  No  Handed:  Right  AIMS (if  indicated):  AIMS:  Facial and Oral Movements  Muscles of Facial Expression: None, normal  Lips and Perioral Area: None, normal  Jaw: None, normal  Tongue: None, normal Extremity Movements: Upper (arms, wrists, hands, fingers): None, normal  Lower (legs, knees, ankles, toes): None, normal,  Trunk Movements:  Neck, shoulders, hips: None, normal,  Overall Severity : Severity of abnormal movements (highest score from questions above): None, normal  Incapacitation due to abnormal movements: None, normal  Patient's awareness of abnormal movements (rate only patient's report): No Awareness, Dental Status  Current problems with teeth and/or dentures?: No  Does patient usually wear dentures?: No     Assets:  Communication Skills Desire for [place]  ADL's:  Intact  Cognition: WNL  Sleep:  variable     Treatment Plan Summary:Medication management   Assessment: Schizoaffective disorder-depressed; PTSD; Insomnia; Nicotine dep   Medication management with supportive therapy. Risks/benefits and SE of the medication discussed. Pt verbalized understanding and verbal consent obtained for treatment.  Affirm with the patient that the medications are taken as ordered. Patient expressed understanding of how their medications were to be used.    Meds: continue Zoloft 200mg  po qD for depression and PTSD Continue Geodon 80mg  BID for schizophrenia Continue Cogentin 1mg  qD for EPS Increase Trazodone 200mg  po qHS prn insomnia  Labs: none  Therapy: brief supportive therapy provided. Discussed psychosocial stressors in detail.   Encouraged pt to develop daily routine and work on daily goal setting as a way to improve mood symptoms.    Consultations:  None  Pt denies SI and is at an acute low risk for suicide. Patient told to call clinic if any problems occur. Patient advised to go to ER if they should develop SI/HI, side effects, or if symptoms worsen. Has crisis  numbers to call if needed. Pt verbalized understanding.  F/up in 3 months or sooner if needed    [name], MD 07/20/2016, 2:44 PM

## 2016-07-31 ENCOUNTER — Other Ambulatory Visit: Payer: Self-pay | Admitting: Gastroenterology

## 2016-09-20 ENCOUNTER — Ambulatory Visit (INDEPENDENT_AMBULATORY_CARE_PROVIDER_SITE_OTHER): Payer: Self-pay | Admitting: Physical Medicine and Rehabilitation

## 2016-10-26 ENCOUNTER — Ambulatory Visit (INDEPENDENT_AMBULATORY_CARE_PROVIDER_SITE_OTHER): Payer: 59 | Admitting: Psychiatry

## 2016-10-26 ENCOUNTER — Encounter (HOSPITAL_COMMUNITY): Payer: Self-pay | Admitting: Psychiatry

## 2016-10-26 DIAGNOSIS — R51 Headache: Secondary | ICD-10-CM | POA: Diagnosis not present

## 2016-10-26 DIAGNOSIS — M542 Cervicalgia: Secondary | ICD-10-CM | POA: Diagnosis not present

## 2016-10-26 DIAGNOSIS — G4700 Insomnia, unspecified: Secondary | ICD-10-CM

## 2016-10-26 DIAGNOSIS — F251 Schizoaffective disorder, depressive type: Secondary | ICD-10-CM

## 2016-10-26 DIAGNOSIS — F5102 Adjustment insomnia: Secondary | ICD-10-CM | POA: Diagnosis not present

## 2016-10-26 DIAGNOSIS — Z818 Family history of other mental and behavioral disorders: Secondary | ICD-10-CM | POA: Diagnosis not present

## 2016-10-26 DIAGNOSIS — M459 Ankylosing spondylitis of unspecified sites in spine: Secondary | ICD-10-CM | POA: Diagnosis not present

## 2016-10-26 DIAGNOSIS — F17210 Nicotine dependence, cigarettes, uncomplicated: Secondary | ICD-10-CM

## 2016-10-26 DIAGNOSIS — Z79899 Other long term (current) drug therapy: Secondary | ICD-10-CM

## 2016-10-26 DIAGNOSIS — F4310 Post-traumatic stress disorder, unspecified: Secondary | ICD-10-CM | POA: Diagnosis not present

## 2016-10-26 MED ORDER — ZIPRASIDONE HCL 80 MG PO CAPS: ORAL_CAPSULE | ORAL | 0 refills | Status: DC

## 2016-10-26 MED ORDER — TRAZODONE HCL 100 MG PO TABS: 200.0000 mg | ORAL_TABLET | Freq: Every day | ORAL | 0 refills | Status: DC

## 2016-10-26 MED ORDER — SERTRALINE HCL 100 MG PO TABS: 200.0000 mg | ORAL_TABLET | Freq: Every day | ORAL | 0 refills | Status: DC

## 2016-10-26 MED ORDER — BENZTROPINE MESYLATE 1 MG PO TABS: 1.0000 mg | ORAL_TABLET | Freq: Every day | ORAL | 0 refills | Status: DC

## 2016-10-26 NOTE — Progress Notes (Signed)
BH MD/PA/NP OP Progress Note  10/26/2016 2:33 PM [name]  MRN:  [id number]  Chief Complaint:  Chief Complaint    Follow-up      HPI: Pt's mother died on October 15, 2022 right in front of the patient.Pt states she has not cried.   Pt states she is ok and she doesn't feel as sad as she thought she would. Pt thinks she is might be a little numb. Pt denies anhedonia, worthlessness and hopelessness. She has been busy cleaning her yard and house. Pt feels relieved. Pt denies SI/HI.  Denies manic and hypomanic symptoms including periods of decreased need for sleep, increased energy, mood lability, impulsivity, FOI, and excessive spending.  Sleeping 8 hrs/night. Energy is a little low but ok. Appetite is good.  Pt denies AVH and paranoid.   Taking meds as prescribed and denies SE.   Visit Diagnosis:    ICD-10-CM   1. Schizoaffective disorder, depressive type ([place]) F25.1 benztropine (COGENTIN) 1 MG tablet    ziprasidone (GEODON) 80 MG capsule    traZODone (DESYREL) 100 MG tablet    sertraline (ZOLOFT) 100 MG tablet  2. Adjustment insomnia F51.02 traZODone (DESYREL) 100 MG tablet  3. PTSD (post-traumatic stress disorder) F43.10 sertraline (ZOLOFT) 100 MG tablet      Past Psychiatric History:  Anxiety:Yes Bipolar Disorder:Yes Depression:Yes Mania:Yes Psychosis:Yes Schizophrenia:Yes Personality Disorder:No Hospitalization for psychiatric illness:Yes History of Electroconvulsive Shock Therapy:No Prior Suicide Attempts:Yes  Past Medical History:  Past Medical History:  Diagnosis Date  . Depression   . Diverticulosis   . GERD (gastroesophageal reflux disease)   . IBS (irritable bowel syndrome)   . Internal hemorrhoid    2nd degree  . Myocarditis ([place]) 2009  . Osteoarthritis   . Prolapsed internal hemorrhoids, grade 3 12/01/2015  . Schizoaffective disorder ([place])   . Tubular adenoma of colon 08/2011    Past Surgical History:  Procedure Laterality Date  .  CHOLECYSTECTOMY    . HEMORRHOID BANDING      Family Psychiatric History:  Family History  Problem Relation Age of Onset  . Suicidality Father   . Depression Father   . Suicidality Sister   . Depression Sister   . Suicidality Cousin   . Suicidality Other     Social History:  Social History   Social History  . Marital status: Divorced    Spouse name: N/A  . Number of children: 2  . Years of education: N/A   Occupational History  . Disabled    Social History Main Topics  . Smoking status: Current Some Day Smoker    Packs/day: 1.00    Years: 38.00    Types: Cigarettes  . Smokeless tobacco: Never Used  . Alcohol use No  . Drug use: No  . Sexual activity: Not Currently   Other Topics Concern  . None   Social History Narrative  . None    Allergies:  Allergies  Allergen Reactions  . Penicillins   . Sulfa Antibiotics Rash    Metabolic Disorder Labs: No results found for: HGBA1C, MPG No results found for: PROLACTIN No results found for: CHOL, TRIG, HDL, CHOLHDL, VLDL, LDLCALC   Current Medications: Current Outpatient Prescriptions  Medication Sig Dispense Refill  . benztropine (COGENTIN) 1 MG tablet Take 1 tablet (1 mg total) by mouth daily. 30 tablet 3  . cholecalciferol (VITAMIN D) 400 UNITS TABS tablet Take 400 Units by mouth daily.    . Cyclobenzaprine HCl (FLEXERIL PO) Take 5 mg by mouth 3 (  three) times daily as needed.    . meloxicam (MOBIC) 15 MG tablet Take 15 mg by mouth daily.  0  . omeprazole (PRILOSEC) 40 MG capsule take 1 capsule by mouth every morning 30 capsule 5  . ondansetron (ZOFRAN-ODT) 4 MG disintegrating tablet Take 1 tablet (4 mg total) by mouth every 8 (eight) hours as needed for nausea or vomiting (and diarrhea). 60 tablet 0  . sertraline (ZOLOFT) 100 MG tablet Take 2 tablets (200 mg total) by mouth daily. 60 tablet 3  . traMADol (ULTRAM) 50 MG tablet Take by mouth every 12 (twelve) hours as needed.    . traZODone (DESYREL) 100 MG  tablet Take 2 tablets (200 mg total) by mouth at bedtime. 60 tablet 3  . ziprasidone (GEODON) 80 MG capsule take 1 capsule by mouth twice a day WITH MEALS 60 capsule 3  . clindamycin (CLEOCIN) 75 MG capsule Take 75 mg by mouth 3 (three) times daily.    Marland Kitchen doxycycline (DORYX) 100 MG DR capsule Take 100 mg by mouth 2 (two) times daily.    Marland Kitchen GARCINIA CAMBOGIA-CHROMIUM PO Take 1 tablet by mouth 2 (two) times daily.    . Vitamins-Lipotropics (B-50 COMPLEX) TABS Take 1 tablet by mouth daily.     No current facility-administered medications for this visit.        Musculoskeletal: Strength & Muscle Tone: within normal limits Gait & Station: normal Patient leans: N/A  Psychiatric Specialty Exam: Review of Systems  Musculoskeletal: Positive for back pain, joint pain and neck pain.  Neurological: Positive for dizziness, tingling, sensory change and headaches.  Psychiatric/Behavioral: Negative for depression, hallucinations, substance abuse and suicidal ideas. The patient is not nervous/anxious and does not have insomnia.     Blood pressure 122/82, pulse 68, height 5' 2.5" (1.588 m), weight 163 lb 6.4 oz (74.1 kg).Body mass index is 29.41 kg/m.  General Appearance: Fairly Groomed  Eye Contact:  Good  Speech:  Clear and Coherent and Normal Rate  Volume:  Normal  Mood:  Euthymic  Affect:  Full Range  Thought Process:  Goal Directed and Descriptions of Associations: Intact  Orientation:  Full (Time, Place, and Person)  Thought Content: Logical   Suicidal Thoughts:  No  Homicidal Thoughts:  No  Memory:  Immediate;   Good Recent;   Good Remote;   Good  Judgement:  Fair  Insight:  Fair  Psychomotor Activity:  Normal  Concentration:  Concentration: Good and Attention Span: Good  Recall:  Good  Fund of Knowledge: Good  Language: Good  Akathisia:  No  Handed:  Right  AIMS (if indicated):  n/a  Assets:  Communication Skills Desire for Improvement Housing  ADL's:  Intact  Cognition: WNL   Sleep:  good     Treatment Plan Summary:Medication management  Assessment: Schizoaffective d/o-depressed; PTSD; Insomnia; Nicotine dep   Medication management with supportive therapy. Risks/benefits and SE of the medication discussed. Pt verbalized understanding and verbal consent obtained for treatment.  Affirm with the patient that the medications are taken as ordered. Patient expressed understanding of how their medications were to be used.   Meds: Zoloft 200mg  po qD for depression and PTSD Geodon 80mg  BID for schizophrenia Cogentin 1mg  po qD to prevent EPS Trazodone 200mg  po qHS prn insomnia   Labs: none   Therapy: brief supportive therapy provided. Discussed psychosocial stressors in detail.   Encouraged pt to develop daily routine and work on daily goal setting as a way to improve mood symptoms.  Consultations: none  Pt denies SI and is at an acute low risk for suicide. Patient told to call clinic if any problems occur. Patient advised to go to ER if they should develop SI/HI, side effects, or if symptoms worsen. Has crisis numbers to call if needed. Pt verbalized understanding.  F/up in 3 months or sooner if needed  [name], MD 10/26/2016, 2:33 PM

## 2016-11-23 ENCOUNTER — Other Ambulatory Visit: Payer: Self-pay | Admitting: Family Medicine

## 2016-11-23 DIAGNOSIS — Z1231 Encounter for screening mammogram for malignant neoplasm of breast: Secondary | ICD-10-CM

## 2016-12-07 ENCOUNTER — Other Ambulatory Visit: Payer: Self-pay | Admitting: Family Medicine

## 2016-12-07 DIAGNOSIS — N644 Mastodynia: Secondary | ICD-10-CM

## 2016-12-14 ENCOUNTER — Other Ambulatory Visit: Payer: Self-pay | Admitting: Family Medicine

## 2016-12-14 DIAGNOSIS — N644 Mastodynia: Secondary | ICD-10-CM

## 2017-01-02 ENCOUNTER — Other Ambulatory Visit: Payer: Self-pay

## 2017-01-25 ENCOUNTER — Ambulatory Visit (HOSPITAL_COMMUNITY): Payer: Self-pay | Admitting: Psychiatry

## 2017-01-30 ENCOUNTER — Other Ambulatory Visit (HOSPITAL_COMMUNITY): Payer: Self-pay

## 2017-01-30 DIAGNOSIS — F4310 Post-traumatic stress disorder, unspecified: Secondary | ICD-10-CM

## 2017-01-30 DIAGNOSIS — F251 Schizoaffective disorder, depressive type: Secondary | ICD-10-CM

## 2017-01-30 MED ORDER — SERTRALINE HCL 100 MG PO TABS: 200.0000 mg | ORAL_TABLET | Freq: Every day | ORAL | 0 refills | Status: DC

## 2017-01-31 ENCOUNTER — Ambulatory Visit: Admission: RE | Admit: 2017-01-31 | Payer: Self-pay | Source: Ambulatory Visit

## 2017-01-31 ENCOUNTER — Ambulatory Visit: Payer: Self-pay

## 2017-01-31 ENCOUNTER — Ambulatory Visit
Admission: RE | Admit: 2017-01-31 | Discharge: 2017-01-31 | Disposition: A | Discharge status: Home / Self Care | Payer: Medicare Other | Source: Ambulatory Visit | Attending: Family Medicine | Admitting: Family Medicine

## 2017-01-31 DIAGNOSIS — N644 Mastodynia: Secondary | ICD-10-CM

## 2017-02-08 ENCOUNTER — Other Ambulatory Visit (HOSPITAL_COMMUNITY): Payer: Self-pay | Admitting: Psychiatry

## 2017-02-08 ENCOUNTER — Telehealth (HOSPITAL_COMMUNITY): Payer: Self-pay | Admitting: Psychiatry

## 2017-02-08 DIAGNOSIS — F251 Schizoaffective disorder, depressive type: Secondary | ICD-10-CM

## 2017-02-08 DIAGNOSIS — F5102 Adjustment insomnia: Secondary | ICD-10-CM

## 2017-02-08 MED ORDER — TRAZODONE HCL 100 MG PO TABS: 250.0000 mg | ORAL_TABLET | Freq: Every day | ORAL | 0 refills | Status: DC

## 2017-02-08 NOTE — Telephone Encounter (Signed)
Pt states she was got a letter that her SSI was going not going to review her file. Pt states she can't keep a job and doesn't feel she can work at all. Pt is sleeping too much during the day and not enough at night. She tried to avoid naps but didn't help.  P: reviewed sleep hygiene Increase Trazodone 250mg  po qHS prn insomnia

## 2017-03-22 ENCOUNTER — Ambulatory Visit (HOSPITAL_COMMUNITY): Payer: Self-pay | Admitting: Psychiatry

## 2017-03-27 ENCOUNTER — Ambulatory Visit (HOSPITAL_COMMUNITY): Payer: Medicaid Other | Admitting: Psychiatry

## 2017-04-05 ENCOUNTER — Ambulatory Visit (HOSPITAL_COMMUNITY): Payer: Medicaid Other | Admitting: Psychiatry

## 2017-04-18 ENCOUNTER — Other Ambulatory Visit (HOSPITAL_COMMUNITY): Payer: Self-pay

## 2017-04-18 DIAGNOSIS — F251 Schizoaffective disorder, depressive type: Secondary | ICD-10-CM

## 2017-04-18 MED ORDER — ZIPRASIDONE HCL 80 MG PO CAPS: ORAL_CAPSULE | ORAL | 0 refills | Status: DC

## 2017-05-03 ENCOUNTER — Ambulatory Visit (INDEPENDENT_AMBULATORY_CARE_PROVIDER_SITE_OTHER): Payer: Medicare Other | Admitting: Psychiatry

## 2017-05-03 ENCOUNTER — Encounter (HOSPITAL_COMMUNITY): Payer: Self-pay | Admitting: Psychiatry

## 2017-05-03 VITALS — BP 128/74 | HR 88 | Ht 63.0 in | Wt 169.0 lb

## 2017-05-03 DIAGNOSIS — R42 Dizziness and giddiness: Secondary | ICD-10-CM

## 2017-05-03 DIAGNOSIS — Z736 Limitation of activities due to disability: Secondary | ICD-10-CM | POA: Diagnosis not present

## 2017-05-03 DIAGNOSIS — F5105 Insomnia due to other mental disorder: Secondary | ICD-10-CM | POA: Diagnosis not present

## 2017-05-03 DIAGNOSIS — F99 Mental disorder, not otherwise specified: Secondary | ICD-10-CM | POA: Diagnosis not present

## 2017-05-03 DIAGNOSIS — F17210 Nicotine dependence, cigarettes, uncomplicated: Secondary | ICD-10-CM

## 2017-05-03 DIAGNOSIS — F251 Schizoaffective disorder, depressive type: Secondary | ICD-10-CM

## 2017-05-03 DIAGNOSIS — M549 Dorsalgia, unspecified: Secondary | ICD-10-CM

## 2017-05-03 DIAGNOSIS — Z79899 Other long term (current) drug therapy: Secondary | ICD-10-CM

## 2017-05-03 DIAGNOSIS — M2550 Pain in unspecified joint: Secondary | ICD-10-CM

## 2017-05-03 DIAGNOSIS — F4310 Post-traumatic stress disorder, unspecified: Secondary | ICD-10-CM | POA: Diagnosis not present

## 2017-05-03 DIAGNOSIS — R51 Headache: Secondary | ICD-10-CM | POA: Diagnosis not present

## 2017-05-03 MED ORDER — BENZTROPINE MESYLATE 1 MG PO TABS: 1.0000 mg | ORAL_TABLET | Freq: Every day | ORAL | 0 refills | Status: DC

## 2017-05-03 MED ORDER — ZIPRASIDONE HCL 80 MG PO CAPS: ORAL_CAPSULE | ORAL | 0 refills | Status: DC

## 2017-05-03 MED ORDER — SERTRALINE HCL 100 MG PO TABS: 200.0000 mg | ORAL_TABLET | Freq: Every day | ORAL | 0 refills | Status: DC

## 2017-05-03 NOTE — Progress Notes (Signed)
BH MD/PA/NP OP Progress Note  05/03/2017 9:16 AM [name]  MRN:  [id number]  Chief Complaint:  Chief Complaint    Schizophrenia; Follow-up     HPI: Pt was last seen in July 2018.   "Yesterday the bottom fell out. I was doing good. I had cut back to smoking 3 cigs a day until yesterday". My bank statement went missing twice. She now feels her house is unsafe. Pt went and opened a new bank account and had the money transferred. She now plans to keep her bank statements in a safe. Last night she was unable to sleep because she is now feeling unsafe in her home. Pt lives alone and nothing like this has ever happened before. Pt is now feeling very anxious. Pt feels like she is being watched and followed. Pt feels the meds are helping but thinks the circumstances have caused stress. Prior to this sleep was good with the Neurontin. Pt states Trazodone was not helping. Pt was now feeling paranoid or overly anxious prior to a few days ago.  Depression is worsening over the last several months. She is unmotivated, fatigued and wants to spend all day in bed. She is isolating and mostly spending her time on house work or watching youtube. Pt states last week she had passive SI without plan or intent. Today denies SI/HI/AVH.   PTSD is mostly stable. With triggers she experiences some symptoms for a few days but nothing recently.  Pt states-taking meds as prescribed and denies SE.   Visit Diagnosis:    ICD-10-CM   1. Schizoaffective disorder, depressive type ([place]) F25.1 benztropine (COGENTIN) 1 MG tablet    ziprasidone (GEODON) 80 MG capsule    sertraline (ZOLOFT) 100 MG tablet  2. PTSD (post-traumatic stress disorder) F43.10 sertraline (ZOLOFT) 100 MG tablet  3. Insomnia due to other mental disorder F51.05    F99   4. Encounter for long-term (current) use of medications Z79.899       Past Psychiatric History:  Anxiety: Yes Bipolar Disorder: Yes Depression: Yes Mania: Yes Psychosis:  Yes Schizophrenia: Yes Personality Disorder: No Hospitalization for psychiatric illness: Yes History of Electroconvulsive Shock Therapy: No Prior Suicide Attempts: Yes   Past Medical History:  Past Medical History:  Diagnosis Date  . Depression   . Diverticulosis   . GERD (gastroesophageal reflux disease)   . IBS (irritable bowel syndrome)   . Internal hemorrhoid    2nd degree  . Myocarditis ([place]) 2009  . Osteoarthritis   . Prolapsed internal hemorrhoids, grade 3 12/01/2015  . Schizoaffective disorder ([place])   . Tubular adenoma of colon 08/2011    Past Surgical History:  Procedure Laterality Date  . BREAST BIOPSY Right 12/27/2012  . CHOLECYSTECTOMY    . HEMORRHOID BANDING      Family Psychiatric History:  Family History  Problem Relation Age of Onset  . Suicidality Father   . Depression Father   . Suicidality Sister   . Depression Sister   . Suicidality Cousin   . Suicidality Other     Social History:  Social History   Socioeconomic History  . Marital status: Divorced    Spouse name: None  . Number of children: 2  . Years of education: None  . Highest education level: None  Social Needs  . Financial resource strain: None  . Food insecurity - worry: None  . Food insecurity - inability: None  . Transportation needs - medical: None  . Transportation needs - non-medical: None  Occupational History  . Occupation: Disabled  Tobacco Use  . Smoking status: Current Some Day Smoker    Packs/day: 1.00    Years: 38.00    Pack years: 38.00    Types: Cigarettes  . Smokeless tobacco: Never Used  Substance and Sexual Activity  . Alcohol use: No    Alcohol/week: 0.0 oz  . Drug use: No  . Sexual activity: Not Currently  Other Topics Concern  . None  Social History Narrative  . None    Allergies:  Allergies  Allergen Reactions  . Penicillins   . Sulfa Antibiotics Rash    Metabolic Disorder Labs: No results found for: HGBA1C, MPG No results found for:  PROLACTIN No results found for: CHOL, TRIG, HDL, CHOLHDL, VLDL, LDLCALC Lab Results  Component Value Date   TSH 1.787 Test methodology is 3rd generation TSH 10/12/2006    Therapeutic Level Labs: No results found for: LITHIUM No results found for: VALPROATE No components found for:  CBMZ  Current Medications: Current Outpatient Medications  Medication Sig Dispense Refill  . benztropine (COGENTIN) 1 MG tablet Take 1 tablet (1 mg total) by mouth daily. 90 tablet 0  . cholecalciferol (VITAMIN D) 400 UNITS TABS tablet Take 400 Units by mouth daily.    . cimetidine (TAGAMET) 200 MG tablet Take 200 mg by mouth at bedtime.    . clindamycin (CLEOCIN) 75 MG capsule Take 75 mg by mouth 3 (three) times daily.    . Cyclobenzaprine HCl (FLEXERIL PO) Take 5 mg by mouth 3 (three) times daily as needed.    . doxycycline (DORYX) 100 MG DR capsule Take 100 mg by mouth 2 (two) times daily.    Marland Kitchen gabapentin (NEURONTIN) 100 MG capsule Take 100 mg by mouth 2 (two) times daily.    Marland Kitchen GARCINIA CAMBOGIA-CHROMIUM PO Take 1 tablet by mouth 2 (two) times daily.    . meloxicam (MOBIC) 15 MG tablet Take 15 mg by mouth daily.  0  . omeprazole (PRILOSEC) 40 MG capsule take 1 capsule by mouth every morning 30 capsule 5  . ondansetron (ZOFRAN-ODT) 4 MG disintegrating tablet Take 1 tablet (4 mg total) by mouth every 8 (eight) hours as needed for nausea or vomiting (and diarrhea). 60 tablet 0  . sertraline (ZOLOFT) 100 MG tablet Take 2 tablets (200 mg total) by mouth daily. 180 tablet 0  . traMADol (ULTRAM) 50 MG tablet Take by mouth every 12 (twelve) hours as needed.    . traZODone (DESYREL) 100 MG tablet Take 2.5 tablets (250 mg total) by mouth at bedtime. 75 tablet 0  . Vitamins-Lipotropics (B-50 COMPLEX) TABS Take 1 tablet by mouth daily.    . ziprasidone (GEODON) 80 MG capsule take 1 capsule by mouth twice a day WITH MEALS 180 capsule 0   No current facility-administered medications for this visit.       Musculoskeletal: Strength & Muscle Tone: within normal limits Gait & Station: normal Patient leans: N/A  Psychiatric Specialty Exam: Review of Systems  Musculoskeletal: Positive for back pain and joint pain. Negative for falls and myalgias.  Neurological: Positive for dizziness, tingling and headaches. Negative for tremors.    Blood pressure 128/74, pulse 88, height 5\' 3"  (1.6 m), weight 169 lb (76.7 kg).Body mass index is 29.94 kg/m.  General Appearance: Fairly Groomed  Eye Contact:  Good  Speech:  Clear and Coherent and Normal Rate  Volume:  Normal  Mood:  Anxious and Depressed  Affect:  Congruent  Thought Process:  Coherent  and Descriptions of Associations: Circumstantial  Orientation:  Full (Time, Place, and Person)  Thought Content: Paranoid Ideation and Rumination   Suicidal Thoughts:  No  Homicidal Thoughts:  No  Memory:  Immediate;   Fair Recent;   Fair Remote;   Fair  Judgement:  Fair  Insight:  Fair  Psychomotor Activity:  Normal  Concentration:  Concentration: Good and Attention Span: Good  Recall:  Good  Fund of Knowledge: Good  Language: Good  Akathisia:  No  Handed:  Right  AIMS (if indicated): not done  Assets:  Communication Skills Desire for Improvement Transportation  ADL's:  Intact  Cognition: WNL  Sleep:  Poor   Screenings:   Assessment and Plan: Schizoaffective disorder- depressed type- worsening; PTSD- stable; insomnia- stable; nicotine dependence    Medication management with supportive therapy. Risks/benefits and SE of the medication discussed. Pt verbalized understanding and verbal consent obtained for treatment.  Affirm with the patient that the medications are taken as ordered. Patient expressed understanding of how their medications were to be used.    Meds: Zoloft 200 mg p.o. daily for PTSD and depression associated with schizoaffective disorder Geodon 80 mg p.o. twice daily for schizophrenia Cogentin 1 mg p.o. daily to  prevent EPS associated with antipsychotic use D/C Trazodone Pt does not want meds changed at this time  Labs: ordered CBC, CMP, HbA1c, Lipid panel, TSH, Prolactin level, EKG   Therapy: brief supportive therapy provided. Discussed psychosocial stressors in detail.   Encouraged pt to develop daily routine and work on daily goal setting as a way to improve mood symptoms.    Consultations: none  Pt denies SI and is at an acute low risk for suicide. Patient told to call clinic if any problems occur. Patient advised to go to ER if they should develop SI/HI, side effects, or if symptoms worsen. Has crisis numbers to call if needed. Pt verbalized understanding.  F/up in 6 weeks or sooner if needed    [name], MD 05/03/2017, 9:16 AM

## 2017-05-05 LAB — CBC WITH DIFFERENTIAL/PLATELET
Basophils Absolute: 0.1 10*3/uL (ref 0.0–0.2)
Basos: 1 %
EOS (ABSOLUTE): 0.1 10*3/uL (ref 0.0–0.4)
EOS: 2 %
HEMATOCRIT: 42.6 % (ref 34.0–46.6)
HEMOGLOBIN: 14.3 g/dL (ref 11.1–15.9)
Immature Grans (Abs): 0 10*3/uL (ref 0.0–0.1)
Immature Granulocytes: 0 %
LYMPHS ABS: 1.8 10*3/uL (ref 0.7–3.1)
Lymphs: 24 %
MCH: 30.8 pg (ref 26.6–33.0)
MCHC: 33.6 g/dL (ref 31.5–35.7)
MCV: 92 fL (ref 79–97)
MONOCYTES: 6 %
Monocytes Absolute: 0.4 10*3/uL (ref 0.1–0.9)
NEUTROS ABS: 4.9 10*3/uL (ref 1.4–7.0)
Neutrophils: 67 %
Platelets: 222 10*3/uL (ref 150–379)
RBC: 4.65 x10E6/uL (ref 3.77–5.28)
RDW: 14 % (ref 12.3–15.4)
WBC: 7.3 10*3/uL (ref 3.4–10.8)

## 2017-05-05 LAB — COMPREHENSIVE METABOLIC PANEL
ALBUMIN: 4.5 g/dL (ref 3.6–4.8)
ALK PHOS: 102 IU/L (ref 39–117)
ALT: 25 IU/L (ref 0–32)
AST: 25 IU/L (ref 0–40)
Albumin/Globulin Ratio: 1.8 (ref 1.2–2.2)
BILIRUBIN TOTAL: 0.4 mg/dL (ref 0.0–1.2)
BUN / CREAT RATIO: 10 — AB (ref 12–28)
BUN: 7 mg/dL — AB (ref 8–27)
CHLORIDE: 99 mmol/L (ref 96–106)
CO2: 25 mmol/L (ref 20–29)
Calcium: 9.5 mg/dL (ref 8.7–10.3)
Creatinine, Ser: 0.71 mg/dL (ref 0.57–1.00)
GFR calc Af Amer: 106 mL/min/{1.73_m2} (ref >59)
GFR calc non Af Amer: 92 mL/min/{1.73_m2} (ref >59)
GLOBULIN, TOTAL: 2.5 g/dL (ref 1.5–4.5)
GLUCOSE: 106 mg/dL — AB (ref 65–99)
Potassium: 4.1 mmol/L (ref 3.5–5.2)
SODIUM: 138 mmol/L (ref 134–144)
TOTAL PROTEIN: 7 g/dL (ref 6.0–8.5)

## 2017-05-05 LAB — LIPID PANEL WITH LDL/HDL RATIO
Cholesterol, Total: 202 mg/dL — ABNORMAL HIGH (ref 100–199)
HDL: 41 mg/dL (ref >39)
LDL CALC: 120 mg/dL — AB (ref 0–99)
LDL/HDL RATIO: 2.9 ratio (ref 0.0–3.2)
Triglycerides: 207 mg/dL — ABNORMAL HIGH (ref 0–149)
VLDL CHOLESTEROL CAL: 41 mg/dL — AB (ref 5–40)

## 2017-05-05 LAB — PROLACTIN: Prolactin: 3.5 ng/mL — ABNORMAL LOW (ref 4.8–23.3)

## 2017-05-05 LAB — HEMOGLOBIN A1C
Est. average glucose Bld gHb Est-mCnc: 103 mg/dL
Hgb A1c MFr Bld: 5.2 % (ref 4.8–5.6)

## 2017-05-05 LAB — TSH: TSH: 2.97 u[IU]/mL (ref 0.450–4.500)

## 2017-06-28 ENCOUNTER — Encounter (HOSPITAL_COMMUNITY): Payer: Self-pay | Admitting: Psychiatry

## 2017-06-28 ENCOUNTER — Other Ambulatory Visit (HOSPITAL_COMMUNITY): Payer: Self-pay | Admitting: Psychiatry

## 2017-06-28 ENCOUNTER — Ambulatory Visit (INDEPENDENT_AMBULATORY_CARE_PROVIDER_SITE_OTHER): Payer: Medicare Other | Admitting: Psychiatry

## 2017-06-28 DIAGNOSIS — Z818 Family history of other mental and behavioral disorders: Secondary | ICD-10-CM | POA: Diagnosis not present

## 2017-06-28 DIAGNOSIS — F17210 Nicotine dependence, cigarettes, uncomplicated: Secondary | ICD-10-CM | POA: Diagnosis not present

## 2017-06-28 DIAGNOSIS — F251 Schizoaffective disorder, depressive type: Secondary | ICD-10-CM

## 2017-06-28 DIAGNOSIS — F419 Anxiety disorder, unspecified: Secondary | ICD-10-CM

## 2017-06-28 DIAGNOSIS — F4310 Post-traumatic stress disorder, unspecified: Secondary | ICD-10-CM | POA: Diagnosis not present

## 2017-06-28 DIAGNOSIS — R450 Nervousness: Secondary | ICD-10-CM

## 2017-06-28 MED ORDER — SERTRALINE HCL 100 MG PO TABS: 200.0000 mg | ORAL_TABLET | Freq: Every day | ORAL | 0 refills | Status: DC

## 2017-06-28 MED ORDER — LITHIUM CARBONATE ER 300 MG PO TBCR: 300.0000 mg | EXTENDED_RELEASE_TABLET | Freq: Every day | ORAL | 2 refills | Status: DC

## 2017-06-28 MED ORDER — BENZTROPINE MESYLATE 1 MG PO TABS: 1.0000 mg | ORAL_TABLET | Freq: Every day | ORAL | 0 refills | Status: DC

## 2017-06-28 MED ORDER — ZIPRASIDONE HCL 80 MG PO CAPS
ORAL_CAPSULE | ORAL | 0 refills | Status: DC
Start: 2017-06-28 — End: 2017-11-01

## 2017-06-28 NOTE — Progress Notes (Signed)
[facility] MD/PA/NP OP Progress Note  06/28/2017 3:30 PM [name]  MRN:  [id number]  Chief Complaint:  Chief Complaint    Schizophrenia; Follow-up     HPI: Pt stopped taking Gabapentin about one week ago due to feeling paranoid. She is now feeling better now. Paranoia has since resolved. She is sleeping ok now with Trazodone. She is getting about 8 hrs. Energy and motivation are low. Pt is feeling depressed. Pt denies SI/HI.  Anxiety has improved since stopping Gabapentin. PTSD is "ok". She is not thinking about her dad as much (intrusive memories have significantly decreased). HV is high. She has nightmares 1-2x/week. Pt denies AVH/ideas of reference/magical thinking. Pt states-taking meds as prescribed and denies SE.   Visit Diagnosis:    ICD-10-CM   1. Schizoaffective disorder, depressive type ([place]) F25.1 benztropine (COGENTIN) 1 MG tablet    sertraline (ZOLOFT) 100 MG tablet    ziprasidone (GEODON) 80 MG capsule  2. PTSD (post-traumatic stress disorder) F43.10 sertraline (ZOLOFT) 100 MG tablet      Past Psychiatric History:  Anxiety:Yes Bipolar Disorder:Yes Depression:Yes Mania:Yes Psychosis:Yes Schizophrenia:Yes Personality Disorder:No Hospitalization for psychiatric illness:Yes History of Electroconvulsive Shock Therapy:No Prior Suicide Attempts:Yes    Past Medical History:  Past Medical History:  Diagnosis Date  . Depression   . Diverticulosis   . GERD (gastroesophageal reflux disease)   . IBS (irritable bowel syndrome)   . Internal hemorrhoid    2nd degree  . Myocarditis ([place]) 2009  . Osteoarthritis   . Prolapsed internal hemorrhoids, grade 3 12/01/2015  . Schizoaffective disorder ([place])   . Tubular adenoma of colon 08/2011    Past Surgical History:  Procedure Laterality Date  . BREAST BIOPSY Right 12/27/2012  . CHOLECYSTECTOMY    . HEMORRHOID BANDING      Family Psychiatric History:  Family History  Problem Relation Age of Onset  .  Suicidality Father   . Depression Father   . Suicidality Sister   . Depression Sister   . Suicidality Cousin   . Suicidality Other     Social History:  Social History   Socioeconomic History  . Marital status: Divorced    Spouse name: Not on file  . Number of children: 2  . Years of education: Not on file  . Highest education level: Not on file  Occupational History  . Occupation: Disabled  Social Needs  . Financial resource strain: Not on file  . Food insecurity:    Worry: Not on file    Inability: Not on file  . Transportation needs:    Medical: Not on file    Non-medical: Not on file  Tobacco Use  . Smoking status: Current Some Day Smoker    Packs/day: 1.00    Years: 38.00    Pack years: 38.00    Types: Cigarettes  . Smokeless tobacco: Never Used  Substance and Sexual Activity  . Alcohol use: No    Alcohol/week: 0.0 oz  . Drug use: No  . Sexual activity: Not Currently  Lifestyle  . Physical activity:    Days per week: Not on file    Minutes per session: Not on file  . Stress: Not on file  Relationships  . Social connections:    Talks on phone: Not on file    Gets together: Not on file    Attends religious service: Not on file    Active member of club or organization: Not on file    Attends meetings of clubs or organizations:  Not on file    Relationship status: Not on file  Other Topics Concern  . Not on file  Social History Narrative  . Not on file    Allergies:  Allergies  Allergen Reactions  . Penicillins   . Sulfa Antibiotics Rash    Metabolic Disorder Labs: Lab Results  Component Value Date   HGBA1C 5.2 05/03/2017   Lab Results  Component Value Date   PROLACTIN 3.5 (L) 05/03/2017   Lab Results  Component Value Date   CHOL 202 (H) 05/03/2017   TRIG 207 (H) 05/03/2017   HDL 41 05/03/2017   LDLCALC 120 (H) 05/03/2017   Lab Results  Component Value Date   TSH 2.970 05/03/2017   TSH 1.787 Test methodology is 3rd generation TSH  10/12/2006    Therapeutic Level Labs: No results found for: LITHIUM No results found for: VALPROATE No components found for:  CBMZ  Current Medications: Current Outpatient Medications  Medication Sig Dispense Refill  . benztropine (COGENTIN) 1 MG tablet Take 1 tablet (1 mg total) by mouth daily. 90 tablet 0  . cholecalciferol (VITAMIN D) 400 UNITS TABS tablet Take 400 Units by mouth daily.    . cimetidine (TAGAMET) 200 MG tablet Take 200 mg by mouth at bedtime.    . Cyclobenzaprine HCl (FLEXERIL PO) Take 5 mg by mouth 3 (three) times daily as needed.    . famotidine (PEPCID) 40 MG tablet Take 40 mg by mouth daily.    Marland Kitchen gabapentin (NEURONTIN) 100 MG capsule Take 100 mg by mouth 2 (two) times daily.    Marland Kitchen GARCINIA CAMBOGIA-CHROMIUM PO Take 1 tablet by mouth 2 (two) times daily.    . meloxicam (MOBIC) 15 MG tablet Take 15 mg by mouth daily.  0  . omeprazole (PRILOSEC) 40 MG capsule take 1 capsule by mouth every morning 30 capsule 5  . ondansetron (ZOFRAN-ODT) 4 MG disintegrating tablet Take 1 tablet (4 mg total) by mouth every 8 (eight) hours as needed for nausea or vomiting (and diarrhea). 60 tablet 0  . sertraline (ZOLOFT) 100 MG tablet Take 2 tablets (200 mg total) by mouth daily. 180 tablet 0  . traMADol (ULTRAM) 50 MG tablet Take by mouth every 12 (twelve) hours as needed.    . Vitamins-Lipotropics (B-50 COMPLEX) TABS Take 1 tablet by mouth daily.    . ziprasidone (GEODON) 80 MG capsule take 1 capsule by mouth twice a day WITH MEALS 180 capsule 0  . clindamycin (CLEOCIN) 75 MG capsule Take 75 mg by mouth 3 (three) times daily.    Marland Kitchen doxycycline (DORYX) 100 MG DR capsule Take 100 mg by mouth 2 (two) times daily.    Marland Kitchen lithium carbonate (LITHOBID) 300 MG CR tablet Take 1 tablet (300 mg total) by mouth at bedtime. 30 tablet 2   No current facility-administered medications for this visit.      Musculoskeletal: Strength & Muscle Tone: within normal limits Gait & Station: normal Patient  leans: N/A  Psychiatric Specialty Exam: Review of Systems  Constitutional: Negative for chills, diaphoresis and fever.  Psychiatric/Behavioral: Positive for depression. Negative for hallucinations, substance abuse and suicidal ideas. The patient is nervous/anxious. The patient does not have insomnia.     Blood pressure 137/79, pulse 92, height 5\' 3"  (1.6 m), weight 174 lb (78.9 kg), SpO2 95 %.Body mass index is 30.82 kg/m.  General Appearance: Fairly Groomed  Eye Contact:  Good  Speech:  Clear and Coherent and Normal Rate  Volume:  Normal  Mood:  Anxious and Depressed  Affect:  Congruent  Thought Process:  Coherent and Descriptions of Associations: Intact  Orientation:  Full (Time, Place, and Person)  Thought Content: Logical   Suicidal Thoughts:  No  Homicidal Thoughts:  No  Memory:  Immediate;   Good Recent;   Good Remote;   Good  Judgement:  Good  Insight:  Good  Psychomotor Activity:  Normal  Concentration:  Concentration: Good and Attention Span: Good  Recall:  Good  Fund of Knowledge: Good  Language: Good  Akathisia:  No  Handed:  Right  AIMS (if indicated): not done  Assets:  Communication Skills Desire for Improvement Housing Transportation  ADL's:  Intact  Cognition: WNL  Sleep:  Good   Screenings:   Assessment and Plan: Schizoaffective disorder-depressed type; PTSD; insomnia; nicotine dependence    Medication management with supportive therapy. Risks and benefits, side effects and alternative treatment options discussed with patient. Pt was given an opportunity to ask questions about medication, illness, and treatment. All current psychiatric medications have been reviewed and discussed with the patient and adjusted as clinically appropriate. The patient has been provided an accurate and updated list of the medications being now prescribed. Patient expressed understanding of how their medications were to be used.  Pt verbalized understanding and verbal consent  obtained for treatment.  The risk of un-intended pregnancy is low based on the fact that pt reports is postmenopausal. Pt is aware that these meds carry a teratogenic risk. Pt will discuss plan of action if she does or plans to become pregnant in the future.  Status of current problems: depression worsening, everything else stable  Meds: Zoloft 200 mg p.o. daily for PTSD and depression associated with schizoaffective disorder Geodon 80 mg p.o. twice daily for schizophrenia Cogentin 1 mg p.o. daily to prevent EPS associated with antipsychotic use Start trial of Lithium 300mg  po qHS for depression   Labs: reviewed  Therapy: brief supportive therapy provided. Discussed psychosocial stressors in detail.     Consultations: Encouraged to follow up with PCP as needed  Pt denies SI and is at an acute low risk for suicide. Patient told to call clinic if any problems occur. Patient advised to go to ER if they should develop SI/HI, side effects, or if symptoms worsen. Has crisis numbers to call if needed. Pt verbalized understanding.  F/up in 6 weeks or sooner if needed    [name], MD 06/28/2017, 3:30 PM

## 2017-07-24 ENCOUNTER — Other Ambulatory Visit (HOSPITAL_COMMUNITY): Payer: Self-pay | Admitting: Psychiatry

## 2017-07-27 ENCOUNTER — Other Ambulatory Visit (HOSPITAL_COMMUNITY): Payer: Self-pay | Admitting: Psychiatry

## 2017-08-10 ENCOUNTER — Other Ambulatory Visit (HOSPITAL_COMMUNITY): Payer: Self-pay | Admitting: Psychiatry

## 2017-08-10 ENCOUNTER — Other Ambulatory Visit (HOSPITAL_COMMUNITY): Payer: Self-pay

## 2017-08-10 DIAGNOSIS — F251 Schizoaffective disorder, depressive type: Secondary | ICD-10-CM

## 2017-08-10 DIAGNOSIS — F5102 Adjustment insomnia: Secondary | ICD-10-CM

## 2017-08-10 MED ORDER — TRAZODONE HCL 100 MG PO TABS: 250.0000 mg | ORAL_TABLET | Freq: Every day | ORAL | 0 refills | Status: DC

## 2017-08-23 ENCOUNTER — Ambulatory Visit (HOSPITAL_COMMUNITY): Payer: Medicare Other | Admitting: Psychiatry

## 2017-08-23 NOTE — Progress Notes (Deleted)
[facility] MD/PA/NP OP Progress Note  08/23/2017 3:30 PM [name]  MRN:  [id number]  Chief Complaint:  HPI: *** Visit Diagnosis: No diagnosis found.  Past Psychiatric History:  Anxiety:Yes Bipolar Disorder:Yes Depression:Yes Mania:Yes Psychosis:Yes Schizophrenia:Yes Personality Disorder:No Hospitalization for psychiatric illness:Yes History of Electroconvulsive Shock Therapy:No Prior Suicide Attempts:Yes   Past Medical History:  Past Medical History:  Diagnosis Date  . Depression   . Diverticulosis   . GERD (gastroesophageal reflux disease)   . IBS (irritable bowel syndrome)   . Internal hemorrhoid    2nd degree  . Myocarditis ([place]) 2009  . Osteoarthritis   . Prolapsed internal hemorrhoids, grade 3 12/01/2015  . Schizoaffective disorder ([place])   . Tubular adenoma of colon 08/2011    Past Surgical History:  Procedure Laterality Date  . BREAST BIOPSY Right 12/27/2012  . CHOLECYSTECTOMY    . HEMORRHOID BANDING      Family Psychiatric History:  Family History  Problem Relation Age of Onset  . Suicidality Father   . Depression Father   . Suicidality Sister   . Depression Sister   . Suicidality Cousin   . Suicidality Other     Social History:  Social History   Socioeconomic History  . Marital status: Divorced    Spouse name: Not on file  . Number of children: 2  . Years of education: Not on file  . Highest education level: Not on file  Occupational History  . Occupation: Disabled  Social Needs  . Financial resource strain: Not on file  . Food insecurity:    Worry: Not on file    Inability: Not on file  . Transportation needs:    Medical: Not on file    Non-medical: Not on file  Tobacco Use  . Smoking status: Current Some Day Smoker    Packs/day: 1.00    Years: 38.00    Pack years: 38.00    Types: Cigarettes  . Smokeless tobacco: Never Used  Substance and Sexual Activity  . Alcohol use: No    Alcohol/week: 0.0 oz  . Drug use: No  .  Sexual activity: Not Currently  Lifestyle  . Physical activity:    Days per week: Not on file    Minutes per session: Not on file  . Stress: Not on file  Relationships  . Social connections:    Talks on phone: Not on file    Gets together: Not on file    Attends religious service: Not on file    Active member of club or organization: Not on file    Attends meetings of clubs or organizations: Not on file    Relationship status: Not on file  Other Topics Concern  . Not on file  Social History Narrative  . Not on file    Allergies:  Allergies  Allergen Reactions  . Penicillins   . Sulfa Antibiotics Rash    Metabolic Disorder Labs: Lab Results  Component Value Date   HGBA1C 5.2 05/03/2017   Lab Results  Component Value Date   PROLACTIN 3.5 (L) 05/03/2017   Lab Results  Component Value Date   CHOL 202 (H) 05/03/2017   TRIG 207 (H) 05/03/2017   HDL 41 05/03/2017   LDLCALC 120 (H) 05/03/2017   Lab Results  Component Value Date   TSH 2.970 05/03/2017   TSH 1.787 ***Test methodology is 3rd generation TSH*** 10/12/2006    Therapeutic Level Labs: No results found for: LITHIUM No results found for: VALPROATE No components found  for:  CBMZ  Current Medications: Current Outpatient Medications  Medication Sig Dispense Refill  . benztropine (COGENTIN) 1 MG tablet Take 1 tablet (1 mg total) by mouth daily. 90 tablet 0  . cholecalciferol (VITAMIN D) 400 UNITS TABS tablet Take 400 Units by mouth daily.    . cimetidine (TAGAMET) 200 MG tablet Take 200 mg by mouth at bedtime.    . clindamycin (CLEOCIN) 75 MG capsule Take 75 mg by mouth 3 (three) times daily.    . Cyclobenzaprine HCl (FLEXERIL PO) Take 5 mg by mouth 3 (three) times daily as needed.    . doxycycline (DORYX) 100 MG DR capsule Take 100 mg by mouth 2 (two) times daily.    . famotidine (PEPCID) 40 MG tablet Take 40 mg by mouth daily.    Marland Kitchen gabapentin (NEURONTIN) 100 MG capsule Take 100 mg by mouth 2 (two) times  daily.    Marland Kitchen GARCINIA CAMBOGIA-CHROMIUM PO Take 1 tablet by mouth 2 (two) times daily.    Marland Kitchen lithium carbonate (LITHOBID) 300 MG CR tablet Take 1 tablet (300 mg total) by mouth at bedtime. 30 tablet 2  . meloxicam (MOBIC) 15 MG tablet Take 15 mg by mouth daily.  0  . omeprazole (PRILOSEC) 40 MG capsule take 1 capsule by mouth every morning 30 capsule 5  . ondansetron (ZOFRAN-ODT) 4 MG disintegrating tablet Take 1 tablet (4 mg total) by mouth every 8 (eight) hours as needed for nausea or vomiting (and diarrhea). 60 tablet 0  . sertraline (ZOLOFT) 100 MG tablet Take 2 tablets (200 mg total) by mouth daily. 180 tablet 0  . traMADol (ULTRAM) 50 MG tablet Take by mouth every 12 (twelve) hours as needed.    . traZODone (DESYREL) 100 MG tablet TAKE 2 AND 1/2 TABLETS(250 MG) BY MOUTH AT BEDTIME 10 tablet 0  . Vitamins-Lipotropics (B-50 COMPLEX) TABS Take 1 tablet by mouth daily.    . ziprasidone (GEODON) 80 MG capsule take 1 capsule by mouth twice a day WITH MEALS 180 capsule 0   No current facility-administered medications for this visit.      Musculoskeletal: Strength & Muscle Tone: {desc; muscle tone:32375} Gait & Station: {PE GAIT ED GHWE:99371} Patient leans: {Patient Leans:21022755}  Psychiatric Specialty Exam: ROS  There were no vitals taken for this visit.There is no height or weight on file to calculate BMI.  General Appearance: {Appearance:22683}  Eye Contact:  {BHH EYE CONTACT:22684}  Speech:  {Speech:22685}  Volume:  {Volume (PAA):22686}  Mood:  {BHH MOOD:22306}  Affect:  {Affect (PAA):22687}  Thought Process:  {Thought Process (PAA):22688}  Orientation:  {BHH ORIENTATION (PAA):22689}  Thought Content: {Thought Content:22690}   Suicidal Thoughts:  {ST/HT (PAA):22692}  Homicidal Thoughts:  {ST/HT (PAA):22692}  Memory:  {BHH MEMORY:22881}  Judgement:  {Judgement (PAA):22694}  Insight:  {Insight (PAA):22695}  Psychomotor Activity:  {Psychomotor (PAA):22696}  Concentration:   {Concentration:21399}  Recall:  {BHH GOOD/FAIR/POOR:22877}  Fund of Knowledge: {BHH GOOD/FAIR/POOR:22877}  Language: {BHH GOOD/FAIR/POOR:22877}  Akathisia:  {BHH YES OR NO:22294}  Handed:  {Handed:22697}  AIMS (if indicated): {Desc; done/not:10129}  Assets:  {Assets (PAA):22698}  ADL's:  {BHH IRC'V:89381}  Cognition: {chl bhh cognition:304700322}  Sleep:  {BHH GOOD/FAIR/POOR:22877}   Screenings:  I reviewed the information below on 08/23/2017 and agree except where noted/changed Assessment and Plan: Schizoaffective disorder-depressed type; PTSD; insomnia; nicotine dependence    Medication management with supportive therapy. Risks and benefits, side effects and alternative treatment options discussed with patient. Pt was given an opportunity to ask questions about medication,  illness, and treatment. All current psychiatric medications have been reviewed and discussed with the patient and adjusted as clinically appropriate. The patient has been provided an accurate and updated list of the medications being now prescribed. Patient expressed understanding of how their medications were to be used.  Pt verbalized understanding and verbal consent obtained for treatment.  The risk of un-intended pregnancy is low based on the fact that pt reports is postmenopausal. Pt is aware that these meds carry a teratogenic risk. Pt will discuss plan of action if she does or plans to become pregnant in the future.  Status of current problems: depression worsening, everything else stable  Meds: Zoloft 200 mg p.o. daily for PTSD and depression associated with schizoaffective disorder Geodon 80 mg p.o. twice daily for schizophrenia Cogentin 1 mg p.o. daily to prevent EPS associated with antipsychotic use Start trial of Lithium 300mg  po qHS for depression   Labs: reviewed  Therapy: brief supportive therapy provided. Discussed psychosocial stressors in detail.     Consultations: Encouraged to  follow up with PCP as needed  Pt denies SI and is at an acute low risk for suicide. Patient told to call clinic if any problems occur. Patient advised to go to ER if they should develop SI/HI, side effects, or if symptoms worsen. Has crisis numbers to call if needed. Pt verbalized understanding.  F/up in 6 weeks or sooner if needed    [name], MD 08/23/2017, 3:30 PM

## 2017-09-06 ENCOUNTER — Ambulatory Visit (HOSPITAL_COMMUNITY): Payer: Medicare Other | Admitting: Psychiatry

## 2017-09-25 ENCOUNTER — Other Ambulatory Visit (HOSPITAL_COMMUNITY): Payer: Self-pay | Admitting: Psychiatry

## 2017-10-02 ENCOUNTER — Other Ambulatory Visit (HOSPITAL_COMMUNITY): Payer: Self-pay

## 2017-10-02 ENCOUNTER — Other Ambulatory Visit (HOSPITAL_COMMUNITY): Payer: Self-pay | Admitting: Psychiatry

## 2017-10-02 MED ORDER — LITHIUM CARBONATE ER 300 MG PO TBCR: 300.0000 mg | EXTENDED_RELEASE_TABLET | Freq: Every day | ORAL | 0 refills | Status: DC

## 2017-10-27 ENCOUNTER — Other Ambulatory Visit (HOSPITAL_COMMUNITY): Payer: Self-pay | Admitting: Psychiatry

## 2017-11-01 ENCOUNTER — Ambulatory Visit (INDEPENDENT_AMBULATORY_CARE_PROVIDER_SITE_OTHER): Payer: Medicare Other | Admitting: Psychiatry

## 2017-11-01 VITALS — BP 135/80 | HR 87 | Ht 63.0 in | Wt 169.0 lb

## 2017-11-01 DIAGNOSIS — F251 Schizoaffective disorder, depressive type: Secondary | ICD-10-CM | POA: Diagnosis not present

## 2017-11-01 DIAGNOSIS — F4310 Post-traumatic stress disorder, unspecified: Secondary | ICD-10-CM

## 2017-11-01 DIAGNOSIS — F5102 Adjustment insomnia: Secondary | ICD-10-CM | POA: Diagnosis not present

## 2017-11-01 DIAGNOSIS — Z79899 Other long term (current) drug therapy: Secondary | ICD-10-CM

## 2017-11-01 MED ORDER — BENZTROPINE MESYLATE 1 MG PO TABS: 1.0000 mg | ORAL_TABLET | Freq: Every day | ORAL | 0 refills | Status: DC

## 2017-11-01 MED ORDER — ZIPRASIDONE HCL 80 MG PO CAPS: ORAL_CAPSULE | ORAL | 0 refills | Status: DC

## 2017-11-01 MED ORDER — SERTRALINE HCL 100 MG PO TABS: 200.0000 mg | ORAL_TABLET | Freq: Every day | ORAL | 0 refills | Status: DC

## 2017-11-01 MED ORDER — ZIPRASIDONE HCL 20 MG PO CAPS: ORAL_CAPSULE | ORAL | 0 refills | Status: DC

## 2017-11-01 MED ORDER — TRAZODONE HCL 100 MG PO TABS: 150.0000 mg | ORAL_TABLET | Freq: Every day | ORAL | 0 refills | Status: DC
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
## 2017-11-18 ENCOUNTER — Other Ambulatory Visit (HOSPITAL_COMMUNITY): Payer: Self-pay | Admitting: Psychiatry

## 2017-11-18 DIAGNOSIS — F251 Schizoaffective disorder, depressive type: Secondary | ICD-10-CM

## 2017-11-19 ENCOUNTER — Other Ambulatory Visit (HOSPITAL_COMMUNITY): Payer: Self-pay | Admitting: Psychiatry

## 2017-11-19 DIAGNOSIS — F251 Schizoaffective disorder, depressive type: Secondary | ICD-10-CM

## 2017-11-19 DIAGNOSIS — F5102 Adjustment insomnia: Secondary | ICD-10-CM

## 2017-12-06 ENCOUNTER — Ambulatory Visit (INDEPENDENT_AMBULATORY_CARE_PROVIDER_SITE_OTHER): Payer: Medicare Other | Admitting: Psychiatry

## 2017-12-06 ENCOUNTER — Encounter (HOSPITAL_COMMUNITY): Payer: Self-pay | Admitting: Psychiatry

## 2017-12-06 VITALS — BP 126/76 | HR 66 | Ht 63.0 in | Wt 168.0 lb

## 2017-12-06 DIAGNOSIS — Z818 Family history of other mental and behavioral disorders: Secondary | ICD-10-CM | POA: Diagnosis not present

## 2017-12-06 DIAGNOSIS — F4310 Post-traumatic stress disorder, unspecified: Secondary | ICD-10-CM | POA: Diagnosis not present

## 2017-12-06 DIAGNOSIS — F5102 Adjustment insomnia: Secondary | ICD-10-CM

## 2017-12-06 DIAGNOSIS — M2550 Pain in unspecified joint: Secondary | ICD-10-CM

## 2017-12-06 DIAGNOSIS — F17210 Nicotine dependence, cigarettes, uncomplicated: Secondary | ICD-10-CM

## 2017-12-06 DIAGNOSIS — F251 Schizoaffective disorder, depressive type: Secondary | ICD-10-CM

## 2017-12-06 DIAGNOSIS — M549 Dorsalgia, unspecified: Secondary | ICD-10-CM

## 2017-12-06 MED ORDER — TRAZODONE HCL 100 MG PO TABS: 150.0000 mg | ORAL_TABLET | Freq: Every day | ORAL | 0 refills | Status: DC

## 2017-12-06 MED ORDER — BENZTROPINE MESYLATE 1 MG PO TABS: 1.0000 mg | ORAL_TABLET | Freq: Every day | ORAL | 0 refills | Status: DC

## 2017-12-06 MED ORDER — SERTRALINE HCL 100 MG PO TABS: 200.0000 mg | ORAL_TABLET | Freq: Every day | ORAL | 0 refills | Status: DC

## 2017-12-06 MED ORDER — ZIPRASIDONE HCL 20 MG PO CAPS: ORAL_CAPSULE | ORAL | 0 refills | Status: DC

## 2017-12-06 MED ORDER — ZIPRASIDONE HCL 80 MG PO CAPS: ORAL_CAPSULE | ORAL | 0 refills | Status: DC
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
## 2017-12-21 ENCOUNTER — Other Ambulatory Visit: Payer: Self-pay | Admitting: Family Medicine

## 2017-12-21 DIAGNOSIS — Z1231 Encounter for screening mammogram for malignant neoplasm of breast: Secondary | ICD-10-CM

## 2018-01-17 ENCOUNTER — Other Ambulatory Visit (HOSPITAL_COMMUNITY): Payer: Self-pay | Admitting: Psychiatry

## 2018-01-17 DIAGNOSIS — F251 Schizoaffective disorder, depressive type: Secondary | ICD-10-CM

## 2018-01-24 ENCOUNTER — Ambulatory Visit (HOSPITAL_COMMUNITY): Payer: Self-pay | Admitting: Psychiatry

## 2018-02-01 ENCOUNTER — Ambulatory Visit: Payer: Self-pay

## 2018-02-14 ENCOUNTER — Ambulatory Visit (INDEPENDENT_AMBULATORY_CARE_PROVIDER_SITE_OTHER): Payer: Medicare Other | Admitting: Psychiatry

## 2018-02-14 ENCOUNTER — Encounter (HOSPITAL_COMMUNITY): Payer: Self-pay | Admitting: Psychiatry

## 2018-02-14 DIAGNOSIS — F5102 Adjustment insomnia: Secondary | ICD-10-CM

## 2018-02-14 DIAGNOSIS — F251 Schizoaffective disorder, depressive type: Secondary | ICD-10-CM

## 2018-02-14 DIAGNOSIS — F4310 Post-traumatic stress disorder, unspecified: Secondary | ICD-10-CM | POA: Diagnosis not present

## 2018-02-14 MED ORDER — ZIPRASIDONE HCL 80 MG PO CAPS: ORAL_CAPSULE | ORAL | 0 refills | Status: DC

## 2018-02-14 MED ORDER — SERTRALINE HCL 100 MG PO TABS: 200.0000 mg | ORAL_TABLET | Freq: Every day | ORAL | 0 refills | Status: DC

## 2018-02-14 MED ORDER — ZIPRASIDONE HCL 20 MG PO CAPS: ORAL_CAPSULE | ORAL | 0 refills | Status: DC

## 2018-02-14 MED ORDER — TRAZODONE HCL 150 MG PO TABS: 300.0000 mg | ORAL_TABLET | Freq: Every day | ORAL | 0 refills | Status: DC

## 2018-02-14 MED ORDER — BENZTROPINE MESYLATE 1 MG PO TABS: 1.0000 mg | ORAL_TABLET | Freq: Every day | ORAL | 0 refills | Status: DC

## 2018-02-14 NOTE — Progress Notes (Signed)
BH MD/PA/NP OP Progress Note  02/14/2018 3:49 PM [name]  MRN:  [id number]  Chief Complaint:  Chief Complaint    Post-Traumatic Stress Disorder; Schizophrenia; Follow-up     HPI:   [name] is feeling depressed and anxious.  She is upset with herself because she has made bad choices regarding her finances.  In August she had a new roof and water filtration system put on the house.  She states that she did not need either and does not know why she did it.  As a result she is paying a $200 bill each month and has not been able to pay other bills such as her cell phone.  She states that she only gets about $800 in disability per month.  [name] does not really have money for food and is barely getting by.  She feels a lot of guilt and some worthlessness about getting into the situation.  She states that she has no one to blame but herself.  Her son and aunt and uncle have helped her out in the past but she feels guilty for taking away from their needs.  When paying her bills earlier this month she did experience some passive suicidal thoughts without a plan or action.  She states that she would never kill her self.  She is overwhelmed and does not know how to fix the situation.  Her PTSD has improved significantly as she has not seen her ex-husband since our last visit.  She has occasional nightmares and some intrusive memories but has not really noticed them much.  She is denying paranoia, auditory and visual hallucinations and ideas of reference.  Visit Diagnosis:    ICD-10-CM   1. Schizoaffective disorder, depressive type ([place]) F25.1 benztropine (COGENTIN) 1 MG tablet    sertraline (ZOLOFT) 100 MG tablet    ziprasidone (GEODON) 80 MG capsule    ziprasidone (GEODON) 20 MG capsule    traZODone (DESYREL) 150 MG tablet  2. PTSD (post-traumatic stress disorder) F43.10 sertraline (ZOLOFT) 100 MG tablet  3. Adjustment insomnia F51.02 traZODone (DESYREL) 150 MG tablet      Past Psychiatric  History:  Anxiety:Yes Bipolar Disorder:Yes Depression:Yes Mania:Yes Psychosis:Yes Schizophrenia:Yes Personality Disorder:No Hospitalization for psychiatric illness:Yes History of Electroconvulsive Shock Therapy:No Prior Suicide Attempts:Yes    Past Medical History:  Past Medical History:  Diagnosis Date  . Depression   . Diverticulosis   . GERD (gastroesophageal reflux disease)   . IBS (irritable bowel syndrome)   . Internal hemorrhoid    2nd degree  . Myocarditis ([place]) 2009  . Osteoarthritis   . Prolapsed internal hemorrhoids, grade 3 12/01/2015  . Schizoaffective disorder ([place])   . Tubular adenoma of colon 08/2011    Past Surgical History:  Procedure Laterality Date  . BREAST BIOPSY Right 12/27/2012  . CHOLECYSTECTOMY    . HEMORRHOID BANDING      Family Psychiatric History:  Family History  Problem Relation Age of Onset  . Suicidality Father   . Depression Father   . Suicidality Sister   . Depression Sister   . Suicidality Cousin   . Suicidality Other     Social History:  Social History   Socioeconomic History  . Marital status: Divorced    Spouse name: Not on file  . Number of children: 2  . Years of education: Not on file  . Highest education level: Not on file  Occupational History  . Occupation: Disabled  Social Needs  . Financial resource strain: Not on  file  . Food insecurity:    Worry: Not on file    Inability: Not on file  . Transportation needs:    Medical: Not on file    Non-medical: Not on file  Tobacco Use  . Smoking status: Current Some Day Smoker    Packs/day: 1.00    Years: 38.00    Pack years: 38.00    Types: Cigarettes  . Smokeless tobacco: Never Used  Substance and Sexual Activity  . Alcohol use: No    Alcohol/week: 0.0 standard drinks  . Drug use: No  . Sexual activity: Not Currently  Lifestyle  . Physical activity:    Days per week: Not on file    Minutes per session: Not on file  . Stress: Not on file   Relationships  . Social connections:    Talks on phone: Not on file    Gets together: Not on file    Attends religious service: Not on file    Active member of club or organization: Not on file    Attends meetings of clubs or organizations: Not on file    Relationship status: Not on file  Other Topics Concern  . Not on file  Social History Narrative  . Not on file    Allergies:  Allergies  Allergen Reactions  . Penicillins   . Sulfa Antibiotics Rash    Metabolic Disorder Labs: Lab Results  Component Value Date   HGBA1C 5.2 05/03/2017   Lab Results  Component Value Date   PROLACTIN 3.5 (L) 05/03/2017   Lab Results  Component Value Date   CHOL 202 (H) 05/03/2017   TRIG 207 (H) 05/03/2017   HDL 41 05/03/2017   LDLCALC 120 (H) 05/03/2017   Lab Results  Component Value Date   TSH 2.970 05/03/2017   TSH 1.787 Test methodology is 3rd generation TSH 10/12/2006    Therapeutic Level Labs: No results found for: LITHIUM No results found for: VALPROATE No components found for:  CBMZ  Current Medications: Current Outpatient Medications  Medication Sig Dispense Refill  . benztropine (COGENTIN) 1 MG tablet Take 1 tablet (1 mg total) by mouth daily. 90 tablet 0  . cholecalciferol (VITAMIN D) 400 UNITS TABS tablet Take 400 Units by mouth daily.    . cimetidine (TAGAMET) 200 MG tablet Take 200 mg by mouth at bedtime.    . Cyclobenzaprine HCl (FLEXERIL PO) Take 5 mg by mouth 3 (three) times daily as needed.    . famotidine (PEPCID) 40 MG tablet Take 40 mg by mouth daily.    Marland Kitchen GARCINIA CAMBOGIA-CHROMIUM PO Take 1 tablet by mouth 2 (two) times daily.    . meloxicam (MOBIC) 15 MG tablet Take 15 mg by mouth daily.  0  . omeprazole (PRILOSEC) 40 MG capsule take 1 capsule by mouth every morning 30 capsule 5  . ondansetron (ZOFRAN-ODT) 4 MG disintegrating tablet Take 1 tablet (4 mg total) by mouth every 8 (eight) hours as needed for nausea or vomiting (and diarrhea). 60 tablet 0  .  sertraline (ZOLOFT) 100 MG tablet Take 2 tablets (200 mg total) by mouth daily. 180 tablet 0  . traMADol (ULTRAM) 50 MG tablet Take by mouth every 12 (twelve) hours as needed.    . traZODone (DESYREL) 150 MG tablet Take 2 tablets (300 mg total) by mouth at bedtime. 180 tablet 0  . ziprasidone (GEODON) 20 MG capsule (80mg  +20mg ) 100mg  BID WITH MEALS 180 capsule 0  . ziprasidone (GEODON) 80 MG capsule take  100mg  (80mg  +20mg ) BID WITH MEALS 180 capsule 0  . gabapentin (NEURONTIN) 100 MG capsule Take 100 mg by mouth 2 (two) times daily.     No current facility-administered medications for this visit.      Musculoskeletal: Strength & Muscle Tone: normall Gait & Station: normal Patient leans: none   Psychiatric Specialty Exam: Review of Systems  Constitutional: Negative for chills, diaphoresis and fever.  Respiratory: Negative for cough, shortness of breath and wheezing.     Blood pressure 132/78, height 5\' 3"  (1.6 m), weight 169 lb 6.4 oz (76.8 kg).Body mass index is 30.01 kg/m.  General Appearance: Fairly Groomed  Eye Contact:  Good  Speech:  Clear and Coherent and Normal Rate  Volume:  Normal  Mood:  Anxious and Depressed  Affect:  Congruent  Thought Process:  Coherent and Descriptions of Associations: Intact  Orientation:  Full (Time, Place, and Person)  Thought Content:  Rumination  Suicidal Thoughts:  No  Homicidal Thoughts:  No  Memory:  Immediate;   Good  Judgement:  Poor  Insight:  Good  Psychomotor Activity:  Normal  Concentration:  Concentration: Good  Recall:  Good  Fund of Knowledge:  Good  Language:  Good  Akathisia:  No  Handed:  Right  AIMS (if indicated):     Assets:  Communication Skills Desire for [place] Talents/Skills Transportation  ADL's:  Intact  Cognition:  WNL  Sleep:   poor      Screenings:    Reviewed the information below on 02/14/2018 and have updated it Assessment and Plan: Schizoaffective  disorder-depressed type; PTSD; insomnia; nicotine dependence    Medication management with supportive therapy. Risks and benefits, side effects and alternative treatment options discussed with patient. Pt was given an opportunity to ask questions about medication, illness, and treatment. All current psychiatric medications have been reviewed and discussed with the patient and adjusted as clinically appropriate. The patient has been provided an accurate and updated list of the medications being now prescribed. Patient expressed understanding of how their medications were to be used.  Pt verbalized understanding and verbal consent obtained for treatment.  The risk of un-intended pregnancy is low based on the fact that pt reports is postmenopausal. Pt is aware that these meds carry a teratogenic risk.   Status of current problems: worsening depression and anxiety  Meds: Zoloft 200 mg p.o. daily for PTSD and depression associated with schizoaffective disorder -Geodon 100mg  po BID with meals for schizophrenia- pt is aware that dose is above the FDA recommended dose and denies SE. She is agreeable to take the increased dose -Cogentin 1 mg p.o. daily to prevent EPS associated with antipsychotic use -increase Trazodone 300mg  po qHS for insomnia and mood   Labs: none  Therapy: brief supportive therapy provided. Discussed psychosocial stressors in detail.   We discussed her financial stressors and did some problem solving.  Patient will call the [facility] to see if she can reduce her monthly payments.  She will go to the food pantry tomorrow.  She will look for seasonal part-time work as a way to supplement her current financial problems.  Consultations: Encouraged to follow up with PCP as needed  Pt denies SI for the last several days and is at an acute low risk for suicide. Patient told to call clinic if any problems occur. Patient advised to go to ER if they should develop SI/HI, side effects,  or if symptoms worsen. Has crisis numbers to call if  needed. Pt verbalized understanding.  F/up in 8 weeks or sooner if needed    [name], MD 02/14/2018, 3:49 PM

## 2018-02-26 ENCOUNTER — Other Ambulatory Visit (HOSPITAL_COMMUNITY): Payer: Self-pay | Admitting: Psychiatry

## 2018-03-05 ENCOUNTER — Other Ambulatory Visit (HOSPITAL_COMMUNITY): Payer: Self-pay | Admitting: Psychiatry

## 2018-04-25 ENCOUNTER — Ambulatory Visit (HOSPITAL_COMMUNITY): Payer: Medicare Other | Admitting: Psychiatry

## 2018-05-14 ENCOUNTER — Other Ambulatory Visit: Payer: Self-pay | Admitting: Family Medicine

## 2018-05-14 DIAGNOSIS — Z1231 Encounter for screening mammogram for malignant neoplasm of breast: Secondary | ICD-10-CM

## 2018-05-30 ENCOUNTER — Ambulatory Visit (INDEPENDENT_AMBULATORY_CARE_PROVIDER_SITE_OTHER): Payer: Medicare Other | Admitting: Psychiatry

## 2018-05-30 ENCOUNTER — Encounter (HOSPITAL_COMMUNITY): Payer: Self-pay | Admitting: Psychiatry

## 2018-05-30 VITALS — BP 130/74 | Ht 62.0 in | Wt 162.0 lb

## 2018-05-30 DIAGNOSIS — F5102 Adjustment insomnia: Secondary | ICD-10-CM | POA: Diagnosis not present

## 2018-05-30 DIAGNOSIS — F4310 Post-traumatic stress disorder, unspecified: Secondary | ICD-10-CM

## 2018-05-30 DIAGNOSIS — F251 Schizoaffective disorder, depressive type: Secondary | ICD-10-CM | POA: Diagnosis not present

## 2018-05-30 MED ORDER — SERTRALINE HCL 100 MG PO TABS: 200.0000 mg | ORAL_TABLET | Freq: Every day | ORAL | 0 refills | Status: DC

## 2018-05-30 MED ORDER — ZIPRASIDONE HCL 80 MG PO CAPS: 80.0000 mg | ORAL_CAPSULE | Freq: Two times a day (BID) | ORAL | 0 refills | Status: DC

## 2018-05-30 MED ORDER — TRAZODONE HCL 300 MG PO TABS: 300.0000 mg | ORAL_TABLET | Freq: Every day | ORAL | 0 refills | Status: DC

## 2018-05-30 MED ORDER — BENZTROPINE MESYLATE 1 MG PO TABS: 1.0000 mg | ORAL_TABLET | Freq: Every day | ORAL | 0 refills | Status: DC

## 2018-05-30 NOTE — Progress Notes (Signed)
BH MD/PA/NP OP Progress Note  05/30/2018 3:23 PM [name]  MRN:  [id number]  Chief Complaint:  Chief Complaint    Depression; Anxiety     HPI: Patient continues to struggle financially.  She has asked to borrow money from various family members and feels very bad.  They are getting tired of trying to help her.  Her daughter bought her food this month.  There was a a few weeks where she did not have any food.  She states her depression is much worse.  She knows that she made a huge mistake and use bad judgment when signing for getting a new roof on the house.  She feels very bad about her decision and the resulting financial trouble she is having.  She continues to feel worthless and sometimes hopeless.  Her sleep is poor.  She tells me she has on and off SI without plan or intent when she really focuses on her finances.  Her PTSD is ongoing.  She has intrusive memories.  She is denying paranoia, auditory and visual hallucinations and ideas of reference  Visit Diagnosis:    ICD-10-CM   1. Schizoaffective disorder, depressive type ([place]) F25.1 ziprasidone (GEODON) 80 MG capsule    traZODone (DESYREL) 300 MG tablet    sertraline (ZOLOFT) 100 MG tablet    benztropine (COGENTIN) 1 MG tablet  2. Adjustment insomnia F51.02 traZODone (DESYREL) 300 MG tablet  3. PTSD (post-traumatic stress disorder) F43.10 sertraline (ZOLOFT) 100 MG tablet      Past Psychiatric History:  Anxiety:Yes Bipolar Disorder:Yes Depression:Yes Mania:Yes Psychosis:Yes Schizophrenia:Yes Personality Disorder:No Hospitalization for psychiatric illness:Yes History of Electroconvulsive Shock Therapy:No Prior Suicide Attempts:Yes    Past Medical History:  Past Medical History:  Diagnosis Date  . Depression   . Diverticulosis   . GERD (gastroesophageal reflux disease)   . IBS (irritable bowel syndrome)   . Internal hemorrhoid    2nd degree  . Myocarditis ([place]) 2009  . Osteoarthritis   .  Prolapsed internal hemorrhoids, grade 3 12/01/2015  . Schizoaffective disorder ([place])   . Tubular adenoma of colon 08/2011    Past Surgical History:  Procedure Laterality Date  . BREAST BIOPSY Right 12/27/2012  . CHOLECYSTECTOMY    . HEMORRHOID BANDING      Family Psychiatric History:  Family History  Problem Relation Age of Onset  . Suicidality Father   . Depression Father   . Suicidality Sister   . Depression Sister   . Suicidality Cousin   . Suicidality Other     Social History:  Social History   Socioeconomic History  . Marital status: Divorced    Spouse name: Not on file  . Number of children: 2  . Years of education: Not on file  . Highest education level: Not on file  Occupational History  . Occupation: Disabled  Social Needs  . Financial resource strain: Not on file  . Food insecurity:    Worry: Not on file    Inability: Not on file  . Transportation needs:    Medical: Not on file    Non-medical: Not on file  Tobacco Use  . Smoking status: Former Smoker    Packs/day: 1.00    Years: 38.00    Pack years: 38.00    Types: Cigarettes    Last attempt to quit: 05/06/2018    Years since quitting: 0.0  . Smokeless tobacco: Never Used  Substance and Sexual Activity  . Alcohol use: No    Alcohol/week: 0.0  standard drinks  . Drug use: No  . Sexual activity: Not Currently  Lifestyle  . Physical activity:    Days per week: Not on file    Minutes per session: Not on file  . Stress: Not on file  Relationships  . Social connections:    Talks on phone: Not on file    Gets together: Not on file    Attends religious service: Not on file    Active member of club or organization: Not on file    Attends meetings of clubs or organizations: Not on file    Relationship status: Not on file  Other Topics Concern  . Not on file  Social History Narrative  . Not on file    Allergies:  Allergies  Allergen Reactions  . Penicillins   . Sulfa Antibiotics Rash     Metabolic Disorder Labs: Lab Results  Component Value Date   HGBA1C 5.2 05/03/2017   Lab Results  Component Value Date   PROLACTIN 3.5 (L) 05/03/2017   Lab Results  Component Value Date   CHOL 202 (H) 05/03/2017   TRIG 207 (H) 05/03/2017   HDL 41 05/03/2017   LDLCALC 120 (H) 05/03/2017   Lab Results  Component Value Date   TSH 2.970 05/03/2017   TSH 1.787 Test methodology is 3rd generation TSH 10/12/2006    Therapeutic Level Labs: No results found for: LITHIUM No results found for: VALPROATE No components found for:  CBMZ  Current Medications: Current Outpatient Medications  Medication Sig Dispense Refill  . benztropine (COGENTIN) 1 MG tablet Take 1 tablet (1 mg total) by mouth daily. 90 tablet 0  . cholecalciferol (VITAMIN D) 400 UNITS TABS tablet Take 400 Units by mouth daily.    . cimetidine (TAGAMET) 200 MG tablet Take 200 mg by mouth at bedtime.    . Cyclobenzaprine HCl (FLEXERIL PO) Take 5 mg by mouth 3 (three) times daily as needed.    . famotidine (PEPCID) 40 MG tablet Take 40 mg by mouth daily.    Marland Kitchen gabapentin (NEURONTIN) 100 MG capsule Take 100 mg by mouth 2 (two) times daily.    Marland Kitchen GARCINIA CAMBOGIA-CHROMIUM PO Take 1 tablet by mouth 2 (two) times daily.    . meloxicam (MOBIC) 15 MG tablet Take 15 mg by mouth daily.  0  . omeprazole (PRILOSEC) 40 MG capsule take 1 capsule by mouth every morning 30 capsule 5  . ondansetron (ZOFRAN-ODT) 4 MG disintegrating tablet Take 1 tablet (4 mg total) by mouth every 8 (eight) hours as needed for nausea or vomiting (and diarrhea). 60 tablet 0  . sertraline (ZOLOFT) 100 MG tablet Take 2 tablets (200 mg total) by mouth daily. 180 tablet 0  . traMADol (ULTRAM) 50 MG tablet Take by mouth every 12 (twelve) hours as needed.    . traZODone (DESYREL) 300 MG tablet Take 1 tablet (300 mg total) by mouth at bedtime. 90 tablet 0  . ziprasidone (GEODON) 80 MG capsule Take 1 capsule (80 mg total) by mouth 2 (two) times daily with a  meal. 180 capsule 0   No current facility-administered medications for this visit.      Musculoskeletal: Strength & Muscle Tone: normall Gait & Station: normal Patient leans: none  Psychiatric Specialty Exam: Review of Systems  Constitutional: Negative for chills, diaphoresis and fever.  HENT: Negative for ear pain and hearing loss.   Respiratory: Negative for stridor.     Blood pressure 130/74, height 5\' 2"  (1.575 m), weight 162  lb (73.5 kg).Body mass index is 29.63 kg/m.  General Appearance: Casual  Eye Contact:  Good  Speech:  Clear and Coherent and Normal Rate  Volume:  Normal  Mood:  Anxious and Depressed  Affect:  Congruent  Thought Process:  Coherent and Descriptions of Associations: Intact  Orientation:  Full (Time, Place, and Person)  Thought Content:  Rumination  Suicidal Thoughts:  Yes.  without intent/plan  Homicidal Thoughts:  No  Memory:  Immediate;   Good  Judgement:  Poor  Insight:  Shallow  Psychomotor Activity:  Normal  Concentration:  Concentration: Fair  Recall:  [employer] of Knowledge:  Fair  Language:  Fair  Akathisia:  No  Handed:  Right  AIMS (if indicated):     Assets:  Desire for Improvement Housing Social Support  ADL's:  Intact  Cognition:  WNL  Sleep:   poor      Screenings:    I reviewed the information below on 05/30/2018 and have updated it Assessment and Plan: Schizoaffective disorder-depressed type; PTSD; insomnia; nicotine dependence    Medication management with supportive therapy. Risks and benefits, side effects and alternative treatment options discussed with patient. Pt was given an opportunity to ask questions about medication, illness, and treatment. All current psychiatric medications have been reviewed and discussed with the patient and adjusted as clinically appropriate. The patient has been provided an accurate and updated list of the medications being now prescribed. Patient expressed understanding of how their  medications were to be used.  Pt verbalized understanding and verbal consent obtained for treatment.  The risk of un-intended pregnancy is low based on the fact that pt reports is postmenopausal. Pt is aware that these meds carry a teratogenic risk.   Status of current problems: worsening depression and anxiety   Meds: Zoloft 200 mg p.o. daily for PTSD and depression associated with schizoaffective disorder -Decrease Geodon 80mg  po BID with meals for schizophrenia.  She stopped taking 100 mg after 2 weeks because she felt "weird" -Cogentin 1 mg p.o. daily to prevent EPS associated with antipsychotic use -increase Trazodone 300mg  po qHS for insomnia and mood. Pt has only been taking 150mg    Labs: none  Therapy: brief supportive therapy provided. Discussed psychosocial stressors in detail.   We discussed her financial stressors and did some problem solving.  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  She has gone [employer].  She has not looked for part-time work as a way to supplement her current financial problems but is considering it.  Consultations: Encouraged to follow up with PCP as needed  Pt reports on/off SI without plan or intent for the last several days and is at an acute low risk for suicide.  She states that she would never do it.  It is just a reaction to feeling overwhelmed patient told to call clinic if any problems occur. Patient advised to go to ER if they should develop SI/HI, side effects, or if symptoms worsen. Has crisis numbers to call if needed. Pt verbalized understanding.  F/up in 6 weeks or sooner if needed    [name], MD 05/30/2018, 3:23 PM

## 2018-06-11 ENCOUNTER — Ambulatory Visit: Payer: Self-pay

## 2018-07-11 ENCOUNTER — Encounter (HOSPITAL_COMMUNITY): Payer: Self-pay | Admitting: Psychiatry

## 2018-07-11 ENCOUNTER — Ambulatory Visit (INDEPENDENT_AMBULATORY_CARE_PROVIDER_SITE_OTHER): Payer: Medicare HMO | Admitting: Psychiatry

## 2018-07-11 ENCOUNTER — Other Ambulatory Visit: Payer: Self-pay

## 2018-07-11 DIAGNOSIS — F251 Schizoaffective disorder, depressive type: Secondary | ICD-10-CM

## 2018-07-11 DIAGNOSIS — F4310 Post-traumatic stress disorder, unspecified: Secondary | ICD-10-CM

## 2018-07-11 DIAGNOSIS — Z79899 Other long term (current) drug therapy: Secondary | ICD-10-CM

## 2018-07-11 DIAGNOSIS — F5102 Adjustment insomnia: Secondary | ICD-10-CM

## 2018-07-11 MED ORDER — SERTRALINE HCL 100 MG PO TABS: 200.0000 mg | ORAL_TABLET | Freq: Every day | ORAL | 0 refills | Status: DC

## 2018-07-11 MED ORDER — BENZTROPINE MESYLATE 1 MG PO TABS: 1.0000 mg | ORAL_TABLET | Freq: Every day | ORAL | 0 refills | Status: DC

## 2018-07-11 MED ORDER — TRAZODONE HCL 300 MG PO TABS: 300.0000 mg | ORAL_TABLET | Freq: Every day | ORAL | 0 refills | Status: DC

## 2018-07-11 MED ORDER — ZIPRASIDONE HCL 80 MG PO CAPS: 80.0000 mg | ORAL_CAPSULE | Freq: Two times a day (BID) | ORAL | 0 refills | Status: DC

## 2018-07-11 NOTE — Progress Notes (Signed)
Virtual Visit via Telephone Note  I connected with [name] on 07/11/18 at  1:15 PM EDT by telephone and verified that I am speaking with the correct person using two identifiers.   I discussed the limitations, risks, security and privacy concerns of performing an evaluation and management service by telephone and the availability of in person appointments. I also discussed with the patient that there may be a patient responsible charge related to this service. The patient expressed understanding and agreed to proceed.   History of Present Illness: [name] tells me that she is feeling better this week.  Her depression is on the "up pill".  She made the decision to be more active and it is really helped.  She is keeping herself busy at the house cleaning her home doing yard work and a few errands.  Overall she is staying home and following CDC guidelines for safety regarding coronavirus.  Prior to this week she experienced about 3 weeks of very bad depression with passive SI.  She stated that she was unmotivated and lethargic she was feeling defeated and sorry for herself.  She decided that she could not continue like that because she did not want to kill herself.  She has a history of 3 previous suicide attempts and at least 13 inpatient psychiatric hospitalizations.  She made a promise after her last suicide attempt that she would never do that to her daughter again.  She spent some time today reflecting on her relationship with her mother.  She endorses some paranoia about her ex-husband.  She tells me that she is attempting to save money and stop relying on family so she is seriously considering getting a part-time job.  Her debt consolidation is ongoing and very stressful but she is attempting to not let it get her down as much as before.  She states her meds have been effective and she is sleeping well.   Observations/Objective: I spoke with [name] on the phone.  She was well engaged, calm,  pleasant and cooperative.  Her speech was spontaneous and clear.  Tone, volume and rate were normal.  Thought processes were linear and goal directed overall intact.  Thought processes were with some paranoia.  She denies auditory and visual hallucinations and did not appear to be responding to internal stimuli during our conversation.  Language and fund of knowledge are good.  Attention and memory are good.  Insight is good judgment is on the poor side.  Assessment and Plan: Schizoaffective disorder-depressed type; PTSD; insomnia; nicotine use disorder  Continue Zoloft 200 mg p.o. daily for PTSD and depression Continue Geodon 80 mg p.o. twice daily with meals for schizophrenia Continue Cogentin 1 mg p.o. daily to prevent EPS Continue trazodone 300 mg p.o. nightly for insomnia  I encouraged the patient to look into work options as she is endorsing continued financial stressors and feels that she would be able to work part-time depending on the job    Follow Up Instructions: Follow-up with me on September 12, 2018 at 10 AM   I discussed the assessment and treatment plan with the patient. The patient was provided an opportunity to ask questions and all were answered. The patient agreed with the plan and demonstrated an understanding of the instructions.   The patient was advised to call back or seek an in-person evaluation if the symptoms worsen or if the condition fails to improve as anticipated.  I provided 25 minutes of non-face-to-face time during this encounter.  [name], MD

## 2018-07-15 ENCOUNTER — Ambulatory Visit: Payer: Self-pay

## 2018-08-28 ENCOUNTER — Ambulatory Visit: Payer: Self-pay

## 2018-09-12 ENCOUNTER — Encounter (HOSPITAL_COMMUNITY): Payer: Self-pay | Admitting: Psychiatry

## 2018-09-12 ENCOUNTER — Ambulatory Visit (INDEPENDENT_AMBULATORY_CARE_PROVIDER_SITE_OTHER): Payer: Medicare HMO | Admitting: Psychiatry

## 2018-09-12 ENCOUNTER — Other Ambulatory Visit: Payer: Self-pay

## 2018-09-12 DIAGNOSIS — F4310 Post-traumatic stress disorder, unspecified: Secondary | ICD-10-CM

## 2018-09-12 DIAGNOSIS — F251 Schizoaffective disorder, depressive type: Secondary | ICD-10-CM

## 2018-09-12 DIAGNOSIS — F5102 Adjustment insomnia: Secondary | ICD-10-CM | POA: Diagnosis not present

## 2018-09-12 MED ORDER — BENZTROPINE MESYLATE 1 MG PO TABS: 1.0000 mg | ORAL_TABLET | Freq: Every day | ORAL | 0 refills | Status: DC

## 2018-09-12 MED ORDER — TRAZODONE HCL 100 MG PO TABS: 200.0000 mg | ORAL_TABLET | Freq: Every day | ORAL | 0 refills | Status: DC

## 2018-09-12 MED ORDER — ZIPRASIDONE HCL 80 MG PO CAPS: 80.0000 mg | ORAL_CAPSULE | Freq: Two times a day (BID) | ORAL | 0 refills | Status: DC

## 2018-09-12 MED ORDER — SERTRALINE HCL 100 MG PO TABS: 200.0000 mg | ORAL_TABLET | Freq: Every day | ORAL | 0 refills | Status: DC

## 2018-09-12 NOTE — Progress Notes (Signed)
Virtual Visit via Telephone Note  I connected with [name] on 09/12/18 at 10:00 AM EDT by telephone and verified that I am speaking with the correct person using two identifiers.  Location: Patient: Home Provider: Office   I discussed the limitations, risks, security and privacy concerns of performing an evaluation and management service by telephone and the availability of in person appointments. I also discussed with the patient that there may be a patient responsible charge related to this service. The patient expressed understanding and agreed to proceed.   History of Present Illness: Pt got her stimulus check at the end of April. She used it to get caught up with her bills. Last night she heard a second stimulus check will be coming out. It will help with more household bills. Pt has food. Last month she was feeling depressed, tired and unmotivated. It coincided with a tooth ache that was going up to her ear. She went to the dentist. Recently she states the depression is a little better. She has a little more energy and motivation than before. She denies SI/HI. She denies AVH and paranoia. [name] is sleeping all thru the night but states she is tired all day. She is anxious more and having racing thoughts, worry and restlessness. PTSD is "pretty much the same".  She is no longer feeling unsafe in her own home.  Patient feels as though her depression is on the mend and is "it is a process".  The only change she wants to make today is to decrease her trazodone.   Observations/Objective: [name] is calm, pleasant and cooperative on the phone today.  She is engaged in the conversation and answers questions appropriately.  Her speech is of slow rate and mildly decreased volume.  Her tone is normal.  Mood is anxious and her affect is blunted.  Thought processes are coherent and intact.  Thought content is logical.  She denies SI/HI.  She denies AVH and did not appear to be responding to internal  stimuli.  Memory, concentration and attention are good.  Fund of knowledge and use of language are average.  Insight is good and judgment is on the poor side.  I am unable to comment on physical appearance, eye contact or psychomotor activity as I was unable to physically see the patient.  Assessment and Plan: Schizoaffective d/o- depressed type; PTSD; insomnia; nicotine use disorder  Status of current symptoms- mild improvement in depression  Decreased trazodone to 200 mg p.o. nightly for insomnia Continue Geodon 80 mg p.o. twice daily for schizoaffective disorder Continue Cogentin 1 mg p.o. daily for preventative EPS associated with antipsychotic use Continue Zoloft 200 mg p.o. daily for depression  Follow Up Instructions: In 2 months or sooner if needed   I discussed the assessment and treatment plan with the patient. The patient was provided an opportunity to ask questions and all were answered. The patient agreed with the plan and demonstrated an understanding of the instructions.   The patient was advised to call back or seek an in-person evaluation if the symptoms worsen or if the condition fails to improve as anticipated.  I provided 20 minutes minutes of non-face-to-face time during this encounter.   [name], MD

## 2018-10-17 ENCOUNTER — Ambulatory Visit: Payer: Medicare HMO

## 2018-10-30 ENCOUNTER — Ambulatory Visit: Payer: Self-pay | Admitting: Physical Therapy

## 2018-11-14 ENCOUNTER — Ambulatory Visit (HOSPITAL_COMMUNITY): Payer: Medicare HMO | Admitting: Psychiatry

## 2018-11-14 ENCOUNTER — Other Ambulatory Visit: Payer: Self-pay

## 2018-12-12 ENCOUNTER — Other Ambulatory Visit: Payer: Self-pay

## 2018-12-12 ENCOUNTER — Ambulatory Visit (HOSPITAL_COMMUNITY): Payer: Medicare HMO | Admitting: Psychiatry

## 2018-12-12 NOTE — Progress Notes (Unsigned)
Pt is currently in the mental hospital per her aunt. Her aunt answered the phone.

## 2019-01-02 ENCOUNTER — Ambulatory Visit (INDEPENDENT_AMBULATORY_CARE_PROVIDER_SITE_OTHER): Payer: Medicare HMO | Admitting: Psychiatry

## 2019-01-02 ENCOUNTER — Encounter (HOSPITAL_COMMUNITY): Payer: Self-pay | Admitting: Psychiatry

## 2019-01-02 ENCOUNTER — Other Ambulatory Visit: Payer: Self-pay

## 2019-01-02 DIAGNOSIS — F251 Schizoaffective disorder, depressive type: Secondary | ICD-10-CM

## 2019-01-02 DIAGNOSIS — F4310 Post-traumatic stress disorder, unspecified: Secondary | ICD-10-CM | POA: Diagnosis not present

## 2019-01-02 DIAGNOSIS — F5102 Adjustment insomnia: Secondary | ICD-10-CM

## 2019-01-02 MED ORDER — BENZTROPINE MESYLATE 1 MG PO TABS: 1.0000 mg | ORAL_TABLET | Freq: Every day | ORAL | 0 refills | Status: DC

## 2019-01-02 MED ORDER — SERTRALINE HCL 100 MG PO TABS: 200.0000 mg | ORAL_TABLET | Freq: Every day | ORAL | 0 refills | Status: DC

## 2019-01-02 MED ORDER — ZIPRASIDONE HCL 80 MG PO CAPS: 80.0000 mg | ORAL_CAPSULE | Freq: Two times a day (BID) | ORAL | 0 refills | Status: DC

## 2019-01-02 MED ORDER — TRAZODONE HCL 300 MG PO TABS: 300.0000 mg | ORAL_TABLET | Freq: Every day | ORAL | 0 refills | Status: DC
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
## 2019-01-30 ENCOUNTER — Ambulatory Visit (INDEPENDENT_AMBULATORY_CARE_PROVIDER_SITE_OTHER): Payer: Medicare HMO | Admitting: Psychiatry

## 2019-01-30 ENCOUNTER — Other Ambulatory Visit: Payer: Self-pay

## 2019-01-30 DIAGNOSIS — Z5329 Procedure and treatment not carried out because of patient's decision for other reasons: Secondary | ICD-10-CM

## 2019-01-30 NOTE — Progress Notes (Signed)
Called only phone number listed on chart and it was not a working number

## 2019-02-27 ENCOUNTER — Ambulatory Visit (INDEPENDENT_AMBULATORY_CARE_PROVIDER_SITE_OTHER): Payer: Medicare HMO | Admitting: Psychiatry

## 2019-02-27 ENCOUNTER — Other Ambulatory Visit: Payer: Self-pay

## 2019-02-27 ENCOUNTER — Encounter (HOSPITAL_COMMUNITY): Payer: Self-pay | Admitting: Psychiatry

## 2019-02-27 DIAGNOSIS — F4310 Post-traumatic stress disorder, unspecified: Secondary | ICD-10-CM

## 2019-02-27 DIAGNOSIS — F251 Schizoaffective disorder, depressive type: Secondary | ICD-10-CM

## 2019-02-27 DIAGNOSIS — F5102 Adjustment insomnia: Secondary | ICD-10-CM

## 2019-02-27 MED ORDER — SERTRALINE HCL 100 MG PO TABS: 200.0000 mg | ORAL_TABLET | Freq: Every day | ORAL | 0 refills | Status: DC

## 2019-02-27 MED ORDER — BENZTROPINE MESYLATE 1 MG PO TABS: 1.0000 mg | ORAL_TABLET | Freq: Every day | ORAL | 0 refills | Status: DC

## 2019-02-27 MED ORDER — TRAZODONE HCL 300 MG PO TABS: 300.0000 mg | ORAL_TABLET | Freq: Every day | ORAL | 0 refills | Status: DC

## 2019-02-27 MED ORDER — ZIPRASIDONE HCL 80 MG PO CAPS: 80.0000 mg | ORAL_CAPSULE | Freq: Two times a day (BID) | ORAL | 0 refills | Status: DC
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
## 2019-04-03 ENCOUNTER — Telehealth (HOSPITAL_COMMUNITY): Payer: Self-pay | Admitting: Psychiatry

## 2019-04-03 ENCOUNTER — Other Ambulatory Visit: Payer: Self-pay

## 2019-04-03 ENCOUNTER — Ambulatory Visit (HOSPITAL_COMMUNITY): Payer: Medicare HMO | Admitting: Psychiatry

## 2019-04-03 NOTE — Telephone Encounter (Signed)
No answer so left 2 VM. I was unable to speak with patient at our scheduled appt time.

## 2019-05-01 ENCOUNTER — Other Ambulatory Visit: Payer: Self-pay

## 2019-05-01 ENCOUNTER — Ambulatory Visit (HOSPITAL_COMMUNITY): Payer: Medicare (Managed Care) | Admitting: Psychiatry

## 2019-05-01 DIAGNOSIS — F4310 Post-traumatic stress disorder, unspecified: Secondary | ICD-10-CM

## 2019-05-01 DIAGNOSIS — F251 Schizoaffective disorder, depressive type: Secondary | ICD-10-CM

## 2019-05-01 DIAGNOSIS — F5102 Adjustment insomnia: Secondary | ICD-10-CM

## 2019-05-01 NOTE — Progress Notes (Unsigned)
Virtual Visit via Telephone Note  I connected with [name] on 05/01/19 at  1:30 PM EST by telephone and verified that I am speaking with the correct person using two identifiers. [name] does not have Internet or a reliable phone. She borrowed her neighbor's phone for today's visit.  Location: Patient: home Provider: office   I discussed the limitations, risks, security and privacy concerns of performing an evaluation and management service by telephone and the availability of in person appointments. I also discussed with the patient that there may be a patient responsible charge related to this service. The patient expressed understanding and agreed to proceed.   History of Present Illness: ***   Observations/Objective:  General Appearance: {Appearance:22683}  Eye Contact:  {BHH EYE CONTACT:22684}  Speech:  {Speech:22685}  Volume:  {Volume (PAA):22686}  Mood:  {BHH MOOD:22306}  Affect:  {Affect (PAA):22687}  Thought Process:  {Thought Process (PAA):22688}  Orientation:  {BHH ORIENTATION (PAA):22689}  Thought Content:  {Thought Content:22690}  Suicidal Thoughts:  {ST/HT (PAA):22692}  Homicidal Thoughts:  {ST/HT (PAA):22692}  Memory:  {BHH MEMORY:22881}  Judgement:  {Judgement (PAA):22694}  Insight:  {Insight (PAA):22695}  Psychomotor Activity:  {Psychomotor (PAA):22696}  Concentration:  {Concentration:21399}  Recall:  {BHH GOOD/FAIR/POOR:22877}  Fund of Knowledge:  {BHH GOOD/FAIR/POOR:22877}  Language:  {BHH GOOD/FAIR/POOR:22877}  Akathisia:  {BHH YES OR NO:22294}  Handed:  {Handed:22697}  AIMS (if indicated):     Assets:  {Assets (PAA):22698}  ADL's:  {BHH TW:9249394  Cognition:  {chl bhh cognition:304700322}  Sleep:        Assessment and Plan: ***  Increase Zoloft 250mg  po qD. Pt reports good benefit from Zoloft and is aware that dose is above what FDA recommendations and wants to do a trial.   Follow Up Instructions: In 6 weeks or sooner if needed   I  discussed the assessment and treatment plan with the patient. The patient was provided an opportunity to ask questions and all were answered. The patient agreed with the plan and demonstrated an understanding of the instructions.   The patient was advised to call back or seek an in-person evaluation if the symptoms worsen or if the condition fails to improve as anticipated.  I provided *** minutes of non-face-to-face time during this encounter.   [name], MD

## 2019-05-02 ENCOUNTER — Encounter (HOSPITAL_COMMUNITY): Payer: Self-pay | Admitting: Psychiatry

## 2019-05-02 MED ORDER — BENZTROPINE MESYLATE 1 MG PO TABS: 1.0000 mg | ORAL_TABLET | Freq: Every day | ORAL | 0 refills | Status: DC

## 2019-05-02 MED ORDER — TRAZODONE HCL 300 MG PO TABS: 300.0000 mg | ORAL_TABLET | Freq: Every day | ORAL | 0 refills | Status: DC

## 2019-05-02 MED ORDER — ZIPRASIDONE HCL 80 MG PO CAPS: 80.0000 mg | ORAL_CAPSULE | Freq: Two times a day (BID) | ORAL | 0 refills | Status: DC

## 2019-05-02 MED ORDER — SERTRALINE HCL 100 MG PO TABS: 250.0000 mg | ORAL_TABLET | Freq: Every day | ORAL | 0 refills | Status: DC

## 2019-06-12 ENCOUNTER — Ambulatory Visit (INDEPENDENT_AMBULATORY_CARE_PROVIDER_SITE_OTHER): Payer: Medicare (Managed Care) | Admitting: Psychiatry

## 2019-06-12 ENCOUNTER — Encounter (HOSPITAL_COMMUNITY): Payer: Self-pay | Admitting: Psychiatry

## 2019-06-12 ENCOUNTER — Other Ambulatory Visit: Payer: Self-pay

## 2019-06-12 DIAGNOSIS — F5102 Adjustment insomnia: Secondary | ICD-10-CM | POA: Diagnosis not present

## 2019-06-12 DIAGNOSIS — F4310 Post-traumatic stress disorder, unspecified: Secondary | ICD-10-CM

## 2019-06-12 DIAGNOSIS — F251 Schizoaffective disorder, depressive type: Secondary | ICD-10-CM | POA: Diagnosis not present

## 2019-06-12 DIAGNOSIS — Z5329 Procedure and treatment not carried out because of patient's decision for other reasons: Secondary | ICD-10-CM | POA: Diagnosis not present

## 2019-06-12 DIAGNOSIS — Z91199 Patient's noncompliance with other medical treatment and regimen due to unspecified reason: Secondary | ICD-10-CM

## 2019-06-12 MED ORDER — ZIPRASIDONE HCL 80 MG PO CAPS: 80.0000 mg | ORAL_CAPSULE | Freq: Two times a day (BID) | ORAL | 0 refills | Status: DC

## 2019-06-12 MED ORDER — BENZTROPINE MESYLATE 1 MG PO TABS: 1.0000 mg | ORAL_TABLET | Freq: Every day | ORAL | 0 refills | Status: DC

## 2019-06-12 MED ORDER — TRAZODONE HCL 50 MG PO TABS: 50.0000 mg | ORAL_TABLET | Freq: Every day | ORAL | 0 refills | Status: DC

## 2019-06-12 MED ORDER — SERTRALINE HCL 100 MG PO TABS: 200.0000 mg | ORAL_TABLET | Freq: Every day | ORAL | 0 refills | Status: DC

## 2019-06-12 MED ORDER — TRAZODONE HCL 300 MG PO TABS: 300.0000 mg | ORAL_TABLET | Freq: Every day | ORAL | 0 refills | Status: DC
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
## 2019-06-12 NOTE — Addendum Note (Signed)
Addended by: [name] on: 06/12/2019 02:32 PM   Modules accepted: Orders, Level of Service

## 2019-07-31 ENCOUNTER — Telehealth (INDEPENDENT_AMBULATORY_CARE_PROVIDER_SITE_OTHER): Payer: Medicare (Managed Care) | Admitting: Psychiatry

## 2019-07-31 ENCOUNTER — Encounter (HOSPITAL_COMMUNITY): Payer: Self-pay | Admitting: Psychiatry

## 2019-07-31 ENCOUNTER — Other Ambulatory Visit: Payer: Self-pay

## 2019-07-31 DIAGNOSIS — F5102 Adjustment insomnia: Secondary | ICD-10-CM | POA: Diagnosis not present

## 2019-07-31 DIAGNOSIS — F4310 Post-traumatic stress disorder, unspecified: Secondary | ICD-10-CM | POA: Diagnosis not present

## 2019-07-31 DIAGNOSIS — F251 Schizoaffective disorder, depressive type: Secondary | ICD-10-CM | POA: Diagnosis not present

## 2019-07-31 MED ORDER — SERTRALINE HCL 100 MG PO TABS: 200.0000 mg | ORAL_TABLET | Freq: Every day | ORAL | 0 refills | Status: DC

## 2019-07-31 MED ORDER — TRAZODONE HCL 300 MG PO TABS: 300.0000 mg | ORAL_TABLET | Freq: Every day | ORAL | 0 refills | Status: DC

## 2019-07-31 MED ORDER — SERTRALINE HCL 50 MG PO TABS: 50.0000 mg | ORAL_TABLET | Freq: Every day | ORAL | 0 refills | Status: DC

## 2019-07-31 MED ORDER — ZIPRASIDONE HCL 80 MG PO CAPS: 80.0000 mg | ORAL_CAPSULE | Freq: Two times a day (BID) | ORAL | 0 refills | Status: DC

## 2019-07-31 MED ORDER — TRAZODONE HCL 50 MG PO TABS: 50.0000 mg | ORAL_TABLET | Freq: Every day | ORAL | 0 refills | Status: DC

## 2019-07-31 MED ORDER — BENZTROPINE MESYLATE 1 MG PO TABS: 1.0000 mg | ORAL_TABLET | Freq: Every day | ORAL | 0 refills | Status: DC
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
## 2019-08-26 ENCOUNTER — Telehealth (HOSPITAL_COMMUNITY): Payer: Self-pay

## 2019-08-26 NOTE — Telephone Encounter (Signed)
Patient called and stated that she needs a letter stating that she's currently under your care and being treated by you. Is it ok to do the letter? Please advise. Thank you

## 2019-09-04 NOTE — Telephone Encounter (Signed)
Yes. Once it's done please fax it to me and I can sign it

## 2019-09-10 ENCOUNTER — Encounter (HOSPITAL_COMMUNITY): Payer: Self-pay

## 2019-09-12 NOTE — Telephone Encounter (Signed)
Notified patient that letter is done and signed via VM. LVM to call me back and let me know how she wants to receive the letter - fax, pick up or mail

## 2019-09-25 ENCOUNTER — Telehealth (HOSPITAL_COMMUNITY): Payer: Self-pay

## 2019-09-25 NOTE — Telephone Encounter (Signed)
The trazodone is for sleep. Theses alternatives are not used for sleep

## 2019-09-25 NOTE — Telephone Encounter (Signed)
Received fax from the pharmacy stating that patient's Trazodone 300 mg is not covered by her insurance. The Preferred Alternatives are:  Nefazodone tab Trintellix tab Viibryd tab  Please review and advise. Thank you

## 2019-09-26 NOTE — Telephone Encounter (Signed)
Ok how would you like to proceed? Do you want to prescribe something else? Please advise

## 2019-09-30 NOTE — Telephone Encounter (Signed)
I'll do a PA for her Trazodone

## 2019-10-03 ENCOUNTER — Telehealth (HOSPITAL_COMMUNITY): Payer: Self-pay

## 2019-10-03 NOTE — Telephone Encounter (Signed)
Received fax from pharmacy requesting a PA be done on patient's Trazodone 300mg  tablet. I spoke with Maggie from [facility] who stated that a PA IS NOT REQUIRED. Reference# [id number]

## 2019-10-23 ENCOUNTER — Other Ambulatory Visit: Payer: Self-pay

## 2019-10-23 ENCOUNTER — Telehealth (HOSPITAL_COMMUNITY): Payer: Medicare (Managed Care) | Admitting: Psychiatry

## 2019-11-03 ENCOUNTER — Other Ambulatory Visit (HOSPITAL_COMMUNITY): Payer: Self-pay | Admitting: Psychiatry

## 2019-11-06 ENCOUNTER — Encounter (HOSPITAL_COMMUNITY): Payer: Self-pay | Admitting: Psychiatry

## 2019-11-06 ENCOUNTER — Other Ambulatory Visit: Payer: Self-pay

## 2019-11-06 ENCOUNTER — Telehealth (INDEPENDENT_AMBULATORY_CARE_PROVIDER_SITE_OTHER): Payer: Medicare (Managed Care) | Admitting: Psychiatry

## 2019-11-06 DIAGNOSIS — F5102 Adjustment insomnia: Secondary | ICD-10-CM

## 2019-11-06 DIAGNOSIS — F251 Schizoaffective disorder, depressive type: Secondary | ICD-10-CM

## 2019-11-06 DIAGNOSIS — F4310 Post-traumatic stress disorder, unspecified: Secondary | ICD-10-CM

## 2019-11-06 MED ORDER — ZIPRASIDONE HCL 80 MG PO CAPS: 80.0000 mg | ORAL_CAPSULE | Freq: Two times a day (BID) | ORAL | 0 refills | Status: DC

## 2019-11-06 MED ORDER — TRAZODONE HCL 300 MG PO TABS: 300.0000 mg | ORAL_TABLET | Freq: Every day | ORAL | 0 refills | Status: DC

## 2019-11-06 MED ORDER — TRAZODONE HCL 50 MG PO TABS: 50.0000 mg | ORAL_TABLET | Freq: Every day | ORAL | 0 refills | Status: DC

## 2019-11-06 MED ORDER — SERTRALINE HCL 100 MG PO TABS: 200.0000 mg | ORAL_TABLET | Freq: Every day | ORAL | 0 refills | Status: DC

## 2019-11-06 MED ORDER — SERTRALINE HCL 50 MG PO TABS: 50.0000 mg | ORAL_TABLET | Freq: Every day | ORAL | 0 refills | Status: DC

## 2019-11-06 MED ORDER — BENZTROPINE MESYLATE 1 MG PO TABS: 1.0000 mg | ORAL_TABLET | Freq: Every day | ORAL | 0 refills | Status: DC
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
## 2019-11-13 ENCOUNTER — Telehealth (HOSPITAL_COMMUNITY): Payer: Self-pay

## 2019-11-13 NOTE — Telephone Encounter (Signed)
Spoke with patient regarding her Trazodone. She stated that the 300mg  strength is too strong and that it makes her too drowsy throughout the day. She stated that the 50mg  is good but just that strength alone isn't quite enough. She stated that it takes her a little longer to get to sleep and doesn't quite keep her asleep. She spoke with her pharmacy and they stated that her insurance will cover Trazodone 100mg  along with the 50mg  she's already taking. Can we send in a 100mg  to go with her 50mg  and stop the 300mg ? Please review and advise. Thank you.

## 2019-11-21 NOTE — Telephone Encounter (Signed)
Yes Trazodone total daily dose 150mg  is fine. Send in a script for 100mg 

## 2019-12-01 ENCOUNTER — Other Ambulatory Visit (HOSPITAL_COMMUNITY): Payer: Self-pay

## 2019-12-01 MED ORDER — TRAZODONE HCL 100 MG PO TABS: ORAL_TABLET | ORAL | 0 refills | Status: DC

## 2019-12-01 NOTE — Telephone Encounter (Signed)
Done

## 2020-01-08 ENCOUNTER — Emergency Department (HOSPITAL_COMMUNITY): Payer: Medicare (Managed Care)

## 2020-01-08 ENCOUNTER — Inpatient Hospital Stay (HOSPITAL_COMMUNITY)
Admission: EM | Admit: 2020-01-08 | Discharge: 2020-01-20 | DRG: 439 | Disposition: A | Discharge status: Home / Self Care | Payer: Medicare (Managed Care) | Attending: Internal Medicine | Admitting: Internal Medicine

## 2020-01-08 ENCOUNTER — Encounter (HOSPITAL_COMMUNITY): Payer: Self-pay

## 2020-01-08 ENCOUNTER — Other Ambulatory Visit: Payer: Self-pay

## 2020-01-08 DIAGNOSIS — K5790 Diverticulosis of intestine, part unspecified, without perforation or abscess without bleeding: Secondary | ICD-10-CM | POA: Diagnosis not present

## 2020-01-08 DIAGNOSIS — K859 Acute pancreatitis without necrosis or infection, unspecified: Principal | ICD-10-CM | POA: Diagnosis present

## 2020-01-08 DIAGNOSIS — Z6830 Body mass index (BMI) 30.0-30.9, adult: Secondary | ICD-10-CM

## 2020-01-08 DIAGNOSIS — M1990 Unspecified osteoarthritis, unspecified site: Secondary | ICD-10-CM | POA: Diagnosis present

## 2020-01-08 DIAGNOSIS — E872 Acidosis, unspecified: Secondary | ICD-10-CM

## 2020-01-08 DIAGNOSIS — K5900 Constipation, unspecified: Secondary | ICD-10-CM | POA: Diagnosis present

## 2020-01-08 DIAGNOSIS — A419 Sepsis, unspecified organism: Secondary | ICD-10-CM | POA: Insufficient documentation

## 2020-01-08 DIAGNOSIS — Z882 Allergy status to sulfonamides status: Secondary | ICD-10-CM

## 2020-01-08 DIAGNOSIS — Z20822 Contact with and (suspected) exposure to covid-19: Secondary | ICD-10-CM | POA: Diagnosis present

## 2020-01-08 DIAGNOSIS — I10 Essential (primary) hypertension: Secondary | ICD-10-CM

## 2020-01-08 DIAGNOSIS — Z880 Allergy status to penicillin: Secondary | ICD-10-CM

## 2020-01-08 DIAGNOSIS — Z9049 Acquired absence of other specified parts of digestive tract: Secondary | ICD-10-CM | POA: Diagnosis not present

## 2020-01-08 DIAGNOSIS — Z79899 Other long term (current) drug therapy: Secondary | ICD-10-CM

## 2020-01-08 DIAGNOSIS — E876 Hypokalemia: Secondary | ICD-10-CM

## 2020-01-08 DIAGNOSIS — D126 Benign neoplasm of colon, unspecified: Secondary | ICD-10-CM | POA: Insufficient documentation

## 2020-01-08 DIAGNOSIS — E663 Overweight: Secondary | ICD-10-CM | POA: Diagnosis present

## 2020-01-08 DIAGNOSIS — Z818 Family history of other mental and behavioral disorders: Secondary | ICD-10-CM

## 2020-01-08 DIAGNOSIS — F17210 Nicotine dependence, cigarettes, uncomplicated: Secondary | ICD-10-CM | POA: Diagnosis present

## 2020-01-08 DIAGNOSIS — R6521 Severe sepsis with septic shock: Secondary | ICD-10-CM

## 2020-01-08 DIAGNOSIS — F320 Major depressive disorder, single episode, mild: Secondary | ICD-10-CM | POA: Diagnosis not present

## 2020-01-08 DIAGNOSIS — F251 Schizoaffective disorder, depressive type: Secondary | ICD-10-CM | POA: Diagnosis not present

## 2020-01-08 DIAGNOSIS — K589 Irritable bowel syndrome without diarrhea: Secondary | ICD-10-CM | POA: Diagnosis present

## 2020-01-08 DIAGNOSIS — K8500 Idiopathic acute pancreatitis without necrosis or infection: Secondary | ICD-10-CM

## 2020-01-08 DIAGNOSIS — K648 Other hemorrhoids: Secondary | ICD-10-CM | POA: Diagnosis present

## 2020-01-08 DIAGNOSIS — A403 Sepsis due to Streptococcus pneumoniae: Secondary | ICD-10-CM | POA: Diagnosis not present

## 2020-01-08 DIAGNOSIS — R739 Hyperglycemia, unspecified: Secondary | ICD-10-CM | POA: Diagnosis present

## 2020-01-08 DIAGNOSIS — F32A Depression, unspecified: Secondary | ICD-10-CM | POA: Diagnosis present

## 2020-01-08 LAB — CBC WITH DIFFERENTIAL/PLATELET
Abs Immature Granulocytes: 0.18 10*3/uL — ABNORMAL HIGH (ref 0.00–0.07)
Basophils Absolute: 0 10*3/uL (ref 0.0–0.1)
Basophils Relative: 0 %
Eosinophils Absolute: 0 10*3/uL (ref 0.0–0.5)
Eosinophils Relative: 0 %
HCT: 49.9 % — ABNORMAL HIGH (ref 36.0–46.0)
Hemoglobin: 16.7 g/dL — ABNORMAL HIGH (ref 12.0–15.0)
Immature Granulocytes: 1 %
Lymphocytes Relative: 4 %
Lymphs Abs: 0.6 10*3/uL — ABNORMAL LOW (ref 0.7–4.0)
MCH: 30.3 pg (ref 26.0–34.0)
MCHC: 33.5 g/dL (ref 30.0–36.0)
MCV: 90.6 fL (ref 80.0–100.0)
Monocytes Absolute: 0.7 10*3/uL (ref 0.1–1.0)
Monocytes Relative: 4 %
Neutro Abs: 14.1 10*3/uL — ABNORMAL HIGH (ref 1.7–7.7)
Neutrophils Relative %: 91 %
Platelets: 326 10*3/uL (ref 150–400)
RBC: 5.51 MIL/uL — ABNORMAL HIGH (ref 3.87–5.11)
RDW: 16.6 % — ABNORMAL HIGH (ref 11.5–15.5)
WBC: 15.5 10*3/uL — ABNORMAL HIGH (ref 4.0–10.5)
nRBC: 0 % (ref 0.0–0.2)

## 2020-01-08 LAB — CBC
HCT: 45.3 % (ref 36.0–46.0)
Hemoglobin: 15.4 g/dL — ABNORMAL HIGH (ref 12.0–15.0)
MCH: 30.4 pg (ref 26.0–34.0)
MCHC: 34 g/dL (ref 30.0–36.0)
MCV: 89.5 fL (ref 80.0–100.0)
Platelets: 254 10*3/uL (ref 150–400)
RBC: 5.06 MIL/uL (ref 3.87–5.11)
RDW: 16.8 % — ABNORMAL HIGH (ref 11.5–15.5)
WBC: 13.6 10*3/uL — ABNORMAL HIGH (ref 4.0–10.5)
nRBC: 0 % (ref 0.0–0.2)

## 2020-01-08 LAB — HEMOGLOBIN A1C
Hgb A1c MFr Bld: 5 % (ref 4.8–5.6)
Mean Plasma Glucose: 96.8 mg/dL

## 2020-01-08 LAB — APTT: aPTT: 25 seconds (ref 24–36)

## 2020-01-08 LAB — I-STAT CHEM 8, ED
BUN: 22 mg/dL (ref 8–23)
Calcium, Ion: 1.33 mmol/L (ref 1.15–1.40)
Chloride: 113 mmol/L — ABNORMAL HIGH (ref 98–111)
Creatinine, Ser: 0.7 mg/dL (ref 0.44–1.00)
Glucose, Bld: 124 mg/dL — ABNORMAL HIGH (ref 70–99)
HCT: 52 % — ABNORMAL HIGH (ref 36.0–46.0)
Hemoglobin: 17.7 g/dL — ABNORMAL HIGH (ref 12.0–15.0)
Potassium: 3.4 mmol/L — ABNORMAL LOW (ref 3.5–5.1)
Sodium: 139 mmol/L (ref 135–145)
TCO2: 9 mmol/L — ABNORMAL LOW (ref 22–32)

## 2020-01-08 LAB — RAPID URINE DRUG SCREEN, HOSP PERFORMED
Amphetamines: NOT DETECTED
Barbiturates: NOT DETECTED
Benzodiazepines: NOT DETECTED
Cocaine: NOT DETECTED
Opiates: NOT DETECTED
Tetrahydrocannabinol: NOT DETECTED

## 2020-01-08 LAB — COMPREHENSIVE METABOLIC PANEL
ALT: 41 U/L (ref 0–44)
AST: 58 U/L — ABNORMAL HIGH (ref 15–41)
Albumin: 4.3 g/dL (ref 3.5–5.0)
Alkaline Phosphatase: 109 U/L (ref 38–126)
Anion gap: 18 — ABNORMAL HIGH (ref 5–15)
BUN: 22 mg/dL (ref 8–23)
CO2: 9 mmol/L — ABNORMAL LOW (ref 22–32)
Calcium: 9.3 mg/dL (ref 8.9–10.3)
Chloride: 109 mmol/L (ref 98–111)
Creatinine, Ser: 1.15 mg/dL — ABNORMAL HIGH (ref 0.44–1.00)
GFR calc Af Amer: 58 mL/min — ABNORMAL LOW (ref >60)
GFR calc non Af Amer: 50 mL/min — ABNORMAL LOW (ref >60)
Glucose, Bld: 124 mg/dL — ABNORMAL HIGH (ref 70–99)
Potassium: 3.3 mmol/L — ABNORMAL LOW (ref 3.5–5.1)
Sodium: 136 mmol/L (ref 135–145)
Total Bilirubin: 1.8 mg/dL — ABNORMAL HIGH (ref 0.3–1.2)
Total Protein: 7.9 g/dL (ref 6.5–8.1)

## 2020-01-08 LAB — PROTIME-INR
INR: 1.1 (ref 0.8–1.2)
Prothrombin Time: 14.1 seconds (ref 11.4–15.2)

## 2020-01-08 LAB — CREATININE, SERUM
Creatinine, Ser: 0.84 mg/dL (ref 0.44–1.00)
GFR calc Af Amer: >60 mL/min (ref >60)
GFR calc non Af Amer: >60 mL/min (ref >60)

## 2020-01-08 LAB — URINALYSIS, ROUTINE W REFLEX MICROSCOPIC
Bilirubin Urine: NEGATIVE
Glucose, UA: NEGATIVE mg/dL
Ketones, ur: 80 mg/dL — AB
Leukocytes,Ua: NEGATIVE
Nitrite: NEGATIVE
Protein, ur: 100 mg/dL — AB
Specific Gravity, Urine: 1.016 (ref 1.005–1.030)
pH: 5 (ref 5.0–8.0)

## 2020-01-08 LAB — LIPASE, BLOOD: Lipase: 1210 U/L — ABNORMAL HIGH (ref 11–51)

## 2020-01-08 LAB — ACETAMINOPHEN LEVEL: Acetaminophen (Tylenol), Serum: 33 ug/mL — ABNORMAL HIGH (ref 10–30)

## 2020-01-08 LAB — ETHANOL: Alcohol, Ethyl (B): <10 mg/dL (ref <10)

## 2020-01-08 LAB — RESPIRATORY PANEL BY RT PCR (FLU A&B, COVID)
Influenza A by PCR: NEGATIVE
Influenza B by PCR: NEGATIVE
SARS Coronavirus 2 by RT PCR: NEGATIVE

## 2020-01-08 LAB — BLOOD GAS, VENOUS
Acid-base deficit: 18.9 mmol/L — ABNORMAL HIGH (ref 0.0–2.0)
Bicarbonate: 8.7 mmol/L — ABNORMAL LOW (ref 20.0–28.0)
O2 Saturation: 79.1 %
Patient temperature: 98.6
pCO2, Ven: 24.7 mmHg — ABNORMAL LOW (ref 44.0–60.0)
pH, Ven: 7.175 — CL (ref 7.250–7.430)
pO2, Ven: 47.8 mmHg — ABNORMAL HIGH (ref 32.0–45.0)

## 2020-01-08 LAB — HIV ANTIBODY (ROUTINE TESTING W REFLEX): HIV Screen 4th Generation wRfx: NONREACTIVE

## 2020-01-08 LAB — LACTIC ACID, PLASMA
Lactic Acid, Venous: 0.8 mmol/L (ref 0.5–1.9)
Lactic Acid, Venous: 0.6 mmol/L (ref 0.5–1.9)
Lactic Acid, Venous: 0.6 mmol/L (ref 0.5–1.9)

## 2020-01-08 LAB — TROPONIN I (HIGH SENSITIVITY)
Troponin I (High Sensitivity): 9 ng/L (ref <18)
Troponin I (High Sensitivity): 19 ng/L — ABNORMAL HIGH (ref <18)

## 2020-01-08 LAB — BRAIN NATRIURETIC PEPTIDE: B Natriuretic Peptide: 76 pg/mL (ref 0.0–100.0)

## 2020-01-08 LAB — PROCALCITONIN: Procalcitonin: <0.1 ng/mL

## 2020-01-08 LAB — SALICYLATE LEVEL: Salicylate Lvl: <7 mg/dL — ABNORMAL LOW (ref 7.0–30.0)

## 2020-01-08 MED ORDER — NICOTINE 14 MG/24HR TD PT24
14.0000 mg | MEDICATED_PATCH | Freq: Every day | TRANSDERMAL | Status: DC
  Administered 2020-01-09 – 2020-01-20 (×13): 14 mg via TRANSDERMAL
  Filled 2020-01-08 (×13): qty 1

## 2020-01-08 MED ORDER — SODIUM CHLORIDE 0.9 % IV BOLUS (SEPSIS)
1000.0000 mL | Freq: Once | INTRAVENOUS | Status: AC
  Administered 2020-01-08: 1000 mL via INTRAVENOUS

## 2020-01-08 MED ORDER — ONDANSETRON HCL 4 MG/2ML IJ SOLN
4.0000 mg | Freq: Four times a day (QID) | INTRAMUSCULAR | Status: DC | PRN
  Administered 2020-01-10 – 2020-01-18 (×10): 4 mg via INTRAVENOUS
  Filled 2020-01-08 (×11): qty 2

## 2020-01-08 MED ORDER — POTASSIUM CHLORIDE 10 MEQ/100ML IV SOLN
10.0000 meq | INTRAVENOUS | Status: AC
  Administered 2020-01-08 – 2020-01-09 (×6): 10 meq via INTRAVENOUS
  Filled 2020-01-08 (×6): qty 100

## 2020-01-08 MED ORDER — MORPHINE SULFATE (PF) 2 MG/ML IV SOLN
1.0000 mg | INTRAVENOUS | Status: DC | PRN
  Administered 2020-01-09 – 2020-01-14 (×21): 2 mg via INTRAVENOUS
  Filled 2020-01-08 (×22): qty 1

## 2020-01-08 MED ORDER — HYDRALAZINE HCL 20 MG/ML IJ SOLN
5.0000 mg | Freq: Four times a day (QID) | INTRAMUSCULAR | Status: DC
  Administered 2020-01-08 – 2020-01-09 (×4): 5 mg via INTRAVENOUS
  Filled 2020-01-08 (×4): qty 1

## 2020-01-08 MED ORDER — METOPROLOL TARTRATE 5 MG/5ML IV SOLN
5.0000 mg | Freq: Three times a day (TID) | INTRAVENOUS | Status: DC
  Administered 2020-01-08 – 2020-01-15 (×20): 5 mg via INTRAVENOUS
  Filled 2020-01-08 (×20): qty 5

## 2020-01-08 MED ORDER — SODIUM CHLORIDE 0.9 % IV SOLN
1.0000 g | Freq: Three times a day (TID) | INTRAVENOUS | Status: DC
  Administered 2020-01-08 – 2020-01-09 (×3): 1 g via INTRAVENOUS
  Filled 2020-01-08 (×4): qty 1

## 2020-01-08 MED ORDER — POLYETHYLENE GLYCOL 3350 17 G PO PACK: 17.0000 g | PACK | Freq: Every day | ORAL | Status: DC | PRN

## 2020-01-08 MED ORDER — IOHEXOL 350 MG/ML SOLN
100.0000 mL | Freq: Once | INTRAVENOUS | Status: AC | PRN
  Administered 2020-01-08: 100 mL via INTRAVENOUS

## 2020-01-08 MED ORDER — ENOXAPARIN SODIUM 40 MG/0.4ML ~~LOC~~ SOLN
40.0000 mg | SUBCUTANEOUS | Status: DC
  Administered 2020-01-08: 40 mg via SUBCUTANEOUS
  Filled 2020-01-08: qty 0.4

## 2020-01-08 MED ORDER — INSULIN ASPART 100 UNIT/ML ~~LOC~~ SOLN
0.0000 [IU] | SUBCUTANEOUS | Status: DC
  Administered 2020-01-09 – 2020-01-12 (×3): 1 [IU] via SUBCUTANEOUS
  Administered 2020-01-14: 2 [IU] via SUBCUTANEOUS
  Administered 2020-01-14: 1 [IU] via SUBCUTANEOUS
  Administered 2020-01-14 – 2020-01-15 (×2): 2 [IU] via SUBCUTANEOUS
  Administered 2020-01-15: 1 [IU] via SUBCUTANEOUS
  Administered 2020-01-16: 2 [IU] via SUBCUTANEOUS
  Administered 2020-01-16 – 2020-01-17 (×4): 1 [IU] via SUBCUTANEOUS
  Administered 2020-01-17: 2 [IU] via SUBCUTANEOUS
  Administered 2020-01-17: 1 [IU] via SUBCUTANEOUS
  Administered 2020-01-17 (×2): 2 [IU] via SUBCUTANEOUS
  Administered 2020-01-18 (×4): 1 [IU] via SUBCUTANEOUS
  Administered 2020-01-18: 2 [IU] via SUBCUTANEOUS
  Administered 2020-01-19 (×2): 1 [IU] via SUBCUTANEOUS
  Administered 2020-01-19: 3 [IU] via SUBCUTANEOUS
  Administered 2020-01-20: 2 [IU] via SUBCUTANEOUS
  Administered 2020-01-20: 1 [IU] via SUBCUTANEOUS
  Administered 2020-01-20: 2 [IU] via SUBCUTANEOUS
  Administered 2020-01-20: 1 [IU] via SUBCUTANEOUS
  Filled 2020-01-08: qty 0.09

## 2020-01-08 MED ORDER — SODIUM CHLORIDE 0.9 % IV SOLN: INTRAVENOUS | Status: DC

## 2020-01-08 MED ORDER — TRAZODONE HCL 50 MG PO TABS
25.0000 mg | ORAL_TABLET | Freq: Every evening | ORAL | Status: DC | PRN
  Administered 2020-01-10 – 2020-01-19 (×9): 25 mg via ORAL
  Filled 2020-01-08 (×9): qty 1

## 2020-01-08 MED ORDER — SERTRALINE HCL 100 MG PO TABS
100.0000 mg | ORAL_TABLET | Freq: Every day | ORAL | Status: DC
  Administered 2020-01-09 – 2020-01-20 (×13): 100 mg via ORAL
  Filled 2020-01-08 (×10): qty 1
  Filled 2020-01-08: qty 2
  Filled 2020-01-08 (×2): qty 1

## 2020-01-08 MED ORDER — ZIPRASIDONE HCL 80 MG PO CAPS
80.0000 mg | ORAL_CAPSULE | Freq: Every day | ORAL | Status: DC
  Administered 2020-01-08 – 2020-01-19 (×12): 80 mg via ORAL
  Filled 2020-01-08 (×5): qty 1
  Filled 2020-01-08: qty 4
  Filled 2020-01-08 (×6): qty 1

## 2020-01-08 MED ORDER — BENZTROPINE MESYLATE 0.5 MG PO TABS
1.0000 mg | ORAL_TABLET | Freq: Every day | ORAL | Status: DC
  Administered 2020-01-09 – 2020-01-20 (×13): 1 mg via ORAL
  Filled 2020-01-08 (×11): qty 2
  Filled 2020-01-08: qty 1
  Filled 2020-01-08: qty 2

## 2020-01-08 MED ORDER — LACTATED RINGERS IV BOLUS
2000.0000 mL | Freq: Once | INTRAVENOUS | Status: AC
  Administered 2020-01-08: 2000 mL via INTRAVENOUS

## 2020-01-08 NOTE — Progress Notes (Signed)
Pharmacy Antibiotic Note  [name] is a 64 y.o. female admitted on 01/08/2020 with probable intra-abd infection, hx diverticulitis.  Pharmacy has been consulted for Meropenem dosing, noting severe PCN allergy, rash/hives  Plan: Meropenem 1gm q8 hr  Height: 5\' 2"  (157.5 cm) Weight: 82.1 kg (181 lb) IBW/kg (Calculated) : 50.1  Temp (24hrs), Avg:97.8 F (36.6 C), Min:97.8 F (36.6 C), Max:97.8 F (36.6 C)  Recent Labs  Lab 01/08/20 1511  CREATININE 0.70    Estimated Creatinine Clearance: 70.5 mL/min (by C-G formula based on SCr of 0.7 mg/dL).    Allergies  Allergen Reactions  . Penicillins     Did it involve swelling of the face/tongue/throat, SOB, or low BP? N Did it involve sudden or severe rash/hives, skin peeling, or any reaction on the inside of your mouth or nose? Y Did you need to seek medical attention at a hospital or doctor's office? N When did it last happen?Several Years Ago If all above answers are "NO", may proceed with cephalosporin use.     . Sulfa Antibiotics Rash   Antimicrobials this admission: 9/30 Meropenem >>   Dose adjustments this admission:  Microbiology results: 9/30 Resp panel PCR: Flu/Covid  Thank you for allowing pharmacy to be a part of this patient's care.  Minda Ditto PharmD 01/08/2020 3:35 PM

## 2020-01-08 NOTE — ED Notes (Signed)
Assumed care of patient at this time, nad noted, sr up x2, bed locked and low, call bell w/I reach.  Will continue to monitor. ° °

## 2020-01-08 NOTE — ED Provider Notes (Signed)
Signout from Dr. Ralene Bathe. 64 year old here with lethargy weakness for 6 days low abdominal pain. Significant metabolic abnormalities. CT showing acute pancreatitis. Plan is for admission to hospitalist. Getting antibiotics fluids. Physical Exam  BP (!) 178/88   Pulse 95   Temp 97.8 F (36.6 C) (Oral)   Resp (!) 21   Ht 5\' 2"  (1.575 m)   Wt 82.1 kg   SpO2 97%   BMI 33.11 kg/m   Physical Exam  ED Course/Procedures     Procedures  MDM         Hayden Rasmussen, MD 01/09/20 1126

## 2020-01-08 NOTE — H&P (Signed)
[facility] History and Physical  [name] KXF:[id number] DOB: May 26, 1955 DOA: 01/08/2020  Referring physician:  PCP: Bartholome Bill, MD   Chief Complaint: Abdominal pain  HPI: [name] is a 65 y.o. WF PMHx depression, schizoaffective DO, myocarditis, irritable bowel syndrome, prolapsed internal hemorrhoids grade 3, tubular adenoma of colon, diverticulosis.  presents to the Emergency Department complaining of abdominal pain. Level V caveat due to altered mental status. History is provided by the patient and EMS. She presents by EMS from home for evaluation of six days of generalized and lower abdominal pain with associated constipation and vomiting. She has associated cough, shortness of breath and generalized weakness. No fevers. She denies any dysuria. She does report urinating less. EMS activated a code sepsis prior to ED arrival due to tachycardia, tachypnea and end tidal capnography of 15.  She denies tobacco, alcohol, drug use.  She is unsure of her home meds.  Takes occasional tylenol.   Review of Systems:  Covid vaccination; vaccinated  Constitutional:  No weight loss, night sweats, Fevers, chills, fatigue.  HEENT:  No headaches, Difficulty swallowing,Tooth/dental problems,Sore throat,  No sneezing, itching, ear ache, nasal congestion, post nasal drip,  Cardio-vascular:  No chest pain, Orthopnea, PND, swelling in lower extremities, anasarca, dizziness, palpitations  GI:  No heartburn, indigestion, positive abdominal pain, nausea, vomiting, negative diarrhea, change in bowel habits, positive loss of appetite  Resp:  No shortness of breath with exertion or at rest. No excess mucus, no productive cough, No non-productive cough, No coughing up of blood.No change in color of mucus.No wheezing.No chest wall deformity  Skin:  no rash or lesions.  GU:  no dysuria, change in color of urine, no urgency or frequency. No flank pain.  Musculoskeletal:  No joint  pain or swelling. No decreased range of motion. No back pain.  Psych:  No change in mood or affect. No depression or anxiety. No memory loss.   Past Medical History:  Diagnosis Date  . Depression   . Diverticulosis   . GERD (gastroesophageal reflux disease)   . IBS (irritable bowel syndrome)   . Internal hemorrhoid    2nd degree  . Myocarditis ([place]) 2009  . Osteoarthritis   . Prolapsed internal hemorrhoids, grade 3 12/01/2015  . Schizoaffective disorder ([place])   . Tubular adenoma of colon 08/2011   Past Surgical History:  Procedure Laterality Date  . BREAST BIOPSY Right 12/27/2012  . CHOLECYSTECTOMY    . HEMORRHOID BANDING     Social History:  reports that she quit smoking about 20 months ago. Her smoking use included cigarettes. She has a 38.00 pack-year smoking history. She has never used smokeless tobacco. She reports that she does not drink alcohol and does not use drugs.  Allergies  Allergen Reactions  . Penicillins     Did it involve swelling of the face/tongue/throat, SOB, or low BP? N Did it involve sudden or severe rash/hives, skin peeling, or any reaction on the inside of your mouth or nose? Y Did you need to seek medical attention at a hospital or doctor's office? N When did it last happen?Several Years Ago If all above answers are "NO", may proceed with cephalosporin use.     . Sulfa Antibiotics Rash    Family History  Problem Relation Age of Onset  . Suicidality Father   . Depression Father   . Suicidality Sister   . Depression Sister   . Suicidality Cousin   . Suicidality Other  Prior to Admission medications   Medication Sig Start Date End Date Taking? Authorizing Provider  benztropine (COGENTIN) 1 MG tablet Take 1 tablet (1 mg total) by mouth daily. 11/06/19  Yes [name], MD  clobetasol (TEMOVATE) 0.05 % external solution Apply 1 application topically 2 (two) times daily.  12/13/19  Yes [provider]  cyclobenzaprine  (FLEXERIL) 5 MG tablet Take 5 mg by mouth 3 (three) times daily as needed for muscle spasms.  10/27/19  Yes [provider]  Fluocinolone Acetonide Scalp 0.01 % OIL Apply 1 application topically at bedtime.  12/09/19  Yes [provider]  fluocinonide cream (LIDEX) 0.05 % Apply topically 3 (three) times daily. 11/03/19  Yes [provider]  meloxicam (MOBIC) 15 MG tablet Take 15 mg by mouth daily as needed for pain.  10/23/16  Yes Bartholome Bill, MD  omeprazole (PRILOSEC) 40 MG capsule take 1 capsule by mouth every morning Patient taking differently: Take 40 mg by mouth daily.  02/17/16  Yes Ladene Artist, MD  ondansetron (ZOFRAN) 4 MG tablet Take 4 mg by mouth every 8 (eight) hours as needed for nausea or vomiting.  09/25/19  Yes [provider]  sertraline (ZOLOFT) 100 MG tablet Take 2 tablets (200 mg total) by mouth daily. Total daily dose= 250mg  Patient taking differently: Take 100 mg by mouth daily.  11/06/19  Yes [name], MD  sertraline (ZOLOFT) 50 MG tablet Take 1 tablet (50 mg total) by mouth daily. Total daily dose is 250mg  po qHS. Patient taking differently: Take 50 mg by mouth at bedtime.  11/06/19 11/05/20 Yes [name], MD  traMADol (ULTRAM) 50 MG tablet Take 50 mg by mouth every 12 (twelve) hours as needed for moderate pain.    Yes [provider]  traZODone (DESYREL) 100 MG tablet Take 1 tablet by mouth at bedtime to equal 150mg  total Patient taking differently: Take 100 mg by mouth at bedtime.  12/01/19  Yes [name], MD  traZODone (DESYREL) 50 MG tablet Take 1 tablet (50 mg total) by mouth at bedtime. Total dose= 350mg  qHS Patient taking differently: Take 50 mg by mouth at bedtime.  11/06/19  Yes [name], MD  Vitamin D, Ergocalciferol, (DRISDOL) 1.25 MG (50000 UNIT) CAPS capsule Take 50,000 Units by mouth once a week. 11/21/19  Yes [provider]  ziprasidone (GEODON) 80 MG capsule Take 1 capsule (80 mg  total) by mouth 2 (two) times daily with a meal. Patient taking differently: Take 80 mg by mouth at bedtime.  11/06/19  Yes [name], MD  ondansetron (ZOFRAN-ODT) 4 MG disintegrating tablet Take 1 tablet (4 mg total) by mouth every 8 (eight) hours as needed for nausea or vomiting (and diarrhea). Patient not taking: Reported on 01/08/2020 01/14/16   Gatha Mayer, MD     Consultants:    Procedures/Significant Events:  9/30 CXR; no active disease 9/30 chest/abdomen/pelvis;- acute uncomplicated pancreatitis with moderate to large amount of peripancreatic stranding, but without definable/drainable fluid collection/pseudocyst and no definitive evidence of pancreatic necrosis. -. Post cholecystectomy. -. 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 9/30 CT Head WO Contrast; No CT evidence for acute intracranial abnormality  I have personally reviewed and interpreted all radiology studies and my findings are as above.   VENTILATOR SETTINGS:    Cultures 9/30 respiratory virus panel SARS coronavirus negative 9/30 influenza  A/B negative   Antimicrobials: Anti-infectives (From admission, onward)   Start     Ordered Stop   01/08/20 1600  meropenem (MERREM) 1 g in sodium chloride 0.9 % 100 mL IVPB        01/08/20 1540         Devices    LINES / TUBES:     Continuous Infusions: . sodium chloride    . meropenem (MERREM) IV Stopped (01/08/20 1805)    Physical Exam: Vitals:   01/08/20 1447 01/08/20 1500 01/08/20 1730 01/08/20 1800  BP:  (!) 178/88 (!) 188/92 (!) 176/82  Pulse:  95 93 95  Resp:  (!) 21 (!) 21 (!) 22  Temp:      TempSrc:      SpO2:  97% 99% 99%  Weight: 82.1 kg     Height: 5\' 2"  (1.575 m)       Wt Readings from Last 3 Encounters:  01/08/20 82.1 kg  01/14/16 73.9 kg  12/01/15 75.5 kg     General: A/O x4, No acute respiratory distress Eyes: negative scleral hemorrhage, negative anisocoria, negative icterus ENT: Negative Runny nose, negative gingival bleeding, Neck:  Negative scars, masses, torticollis, lymphadenopathy, JVD Lungs: Clear to auscultation bilaterally without wheezes or crackles Cardiovascular: Regular rate and rhythm without murmur gallop or rub normal S1 and S2 Abdomen: Positive epigastric/RUQ abdominal pain, nondistended, positive soft, bowel sounds, no rebound, no ascites, no appreciable mass Extremities: No significant cyanosis, clubbing, or edema bilateral lower extremities Skin: Negative rashes, lesions, ulcers Psychiatric:  Negative depression, negative anxiety, negative fatigue, negative mania  Central nervous system:  Cranial nerves II through XII intact, tongue/uvula midline, all extremities muscle strength 5/5, sensation intact throughout, negative dysarthria, negative expressive aphasia, negative receptive aphasia.        Labs on Admission:  Basic Metabolic Panel: Recent Labs  Lab 01/08/20 1449 01/08/20 1511  NA 136 139  K 3.3* 3.4*  CL 109 113*  CO2 9*  --   GLUCOSE 124* 124*  BUN 22 22  CREATININE 1.15* 0.70  CALCIUM 9.3  --    Liver Function Tests: Recent Labs  Lab 01/08/20 1449  AST 58*  ALT 41  ALKPHOS 109  BILITOT 1.8*  PROT 7.9  ALBUMIN 4.3   Recent Labs  Lab 01/08/20 1449  LIPASE 1,210*   No results for input(s): AMMONIA in the last 168 hours. CBC: Recent Labs  Lab 01/08/20 1449 01/08/20 1511  WBC 15.5*  --   NEUTROABS 14.1*  --   HGB 16.7* 17.7*  HCT 49.9* 52.0*  MCV 90.6  --   PLT 326  --    Cardiac Enzymes: No results for input(s): CKTOTAL, CKMB, CKMBINDEX, TROPONINI in the last 168 hours.  BNP (last 3 results) Recent Labs    01/08/20 1450  BNP 76.0    ProBNP (last 3 results) No results for input(s): PROBNP in the last 8760 hours.  CBG: No results for input(s): GLUCAP in the last 168  hours.  Radiological Exams on Admission: DG Chest Port 1 View  Result Date: 01/08/2020 CLINICAL DATA:  uncle called EMS out today complaining of extreme lethargy and weakness for 6 days. EXAM: PORTABLE CHEST 1 VIEW.  AP portable semi erect.  Patient is rotated COMPARISON:  None. FINDINGS: The heart size and mediastinal contours are within normal limits. No focal consolidation. No pulmonary edema. No pleural effusion. No pneumothorax. No acute osseous abnormality. IMPRESSION: No active disease. Electronically Signed   By: Iven Finn  M.D.   On: 01/08/2020 15:53    EKG: Independently reviewed.   Assessment/Plan Active Problems:   Schizoaffective disorder, depressive type (HCC)   Cigarette nicotine dependence without complication   Prolapsed internal hemorrhoids, grade 3   Acute pancreatitis   Depression   Diverticulosis   Tubular adenoma of colon  Sepsis unspecified organism -On admission patient meets criteria for sepsis RR> 22, WBC> 12, area of infection pancreas. -Lactic acid -Procalcitonin -Continue current antibiotics, use procalcitonin to determine if antibiotic should be narrow or discontinued  Acute pancreatitis -Normal saline 112ml/hr -Strict in and out -Daily weight -Titrate O2 to maintain SPO2> 95% -Calculate Ranson's criteria for mortality in the a.m. -Zofran PRN  Essential HTN -Metoprolol IV 5 mg TID -Hydralazine IV 5 mg QID hold for MAP<65  Hypokalemia -Potassium goal> 4 -Potassium IV 60 mEq -Recheck K/Mg @2400   Hyperglycemia -Sensitive SSI  Schizoaffective disorder/depression -Benztropine 1 mg daily -Zoloft 100 mg daily -Geodon 80 mg qhs  Tobacco abuse -Nicotine patch    Code Status: Full (DVT Prophylaxis: Lovenox Family Communication:   Status is: Inpatient    Dispo: The patient is from: Home              Anticipated d/c is to: Home              Anticipated d/c date is: 10/5              Patient currently unstable     Data  Reviewed: Care during the described time interval was provided by me .  I have reviewed this patient's available data, including medical history, events of note, physical examination, and all test results as part of my evaluation.   The patient is critically ill with multiple organ systems failure and requires high complexity decision making for assessment and support, frequent evaluation and titration of therapies, application of advanced monitoring technologies and extensive interpretation of multiple databases. Critical Care Time devoted to patient care services described in this note  Time spent: 24 minutes   Xzayvier Fagin, [facility] Pager [phone]

## 2020-01-08 NOTE — ED Provider Notes (Signed)
[name] DEPT Provider Note   CSN: [id number] Arrival date & time: 01/08/20  1424     History Chief Complaint  Patient presents with  . Weakness  . Abdominal Pain    [name] is a 64 y.o. female.  The history is provided by the patient, the EMS personnel and medical records.  Weakness Associated symptoms: abdominal pain   Abdominal Pain  [name] is a 64 y.o. female who presents to the Emergency Department complaining of abdominal pain. Level V caveat due to altered mental status. History is provided by the patient and EMS. She presents by EMS from home for evaluation of six days of generalized and lower abdominal pain with associated constipation and vomiting. She has associated cough, shortness of breath and generalized weakness. No fevers. She denies any dysuria. She does report urinating less. EMS activated a code sepsis prior to ED arrival due to tachycardia, tachypnea and end tidal capnography of 15.  She denies tobacco, alcohol, drug use.  She is unsure of her home meds.  Takes occasional tylenol.      Past Medical History:  Diagnosis Date  . Depression   . Diverticulosis   . GERD (gastroesophageal reflux disease)   . IBS (irritable bowel syndrome)   . Internal hemorrhoid    2nd degree  . Myocarditis ([place]) 2009  . Osteoarthritis   . Prolapsed internal hemorrhoids, grade 3 12/01/2015  . Schizoaffective disorder ([place])   . Tubular adenoma of colon 08/2011    Patient Active Problem List   Diagnosis Date Noted  . Prolapsed internal hemorrhoids, grade 3 12/01/2015  . Schizoaffective disorder, depressive type ([place]) 11/04/2013  . PTSD (post-traumatic stress disorder) 11/04/2013  . Cigarette nicotine dependence without complication 51/76/1607    Past Surgical History:  Procedure Laterality Date  . BREAST BIOPSY Right 12/27/2012  . CHOLECYSTECTOMY    . HEMORRHOID BANDING       OB History   No obstetric history on  file.     Family History  Problem Relation Age of Onset  . Suicidality Father   . Depression Father   . Suicidality Sister   . Depression Sister   . Suicidality Cousin   . Suicidality Other     Social History   Tobacco Use  . Smoking status: Former Smoker    Packs/day: 1.00    Years: 38.00    Pack years: 38.00    Types: Cigarettes    Quit date: 05/06/2018    Years since quitting: 1.6  . Smokeless tobacco: Never Used  Vaping Use  . Vaping Use: Never used  Substance Use Topics  . Alcohol use: No    Alcohol/week: 0.0 standard drinks  . Drug use: No    Home Medications Prior to Admission medications   Medication Sig Start Date End Date Taking? Authorizing Provider  benztropine (COGENTIN) 1 MG tablet Take 1 tablet (1 mg total) by mouth daily. 11/06/19  Yes [name], MD  clobetasol (TEMOVATE) 0.05 % external solution Apply 1 application topically 2 (two) times daily.  12/13/19  Yes [provider]  cyclobenzaprine (FLEXERIL) 5 MG tablet Take 5 mg by mouth 3 (three) times daily as needed for muscle spasms.  10/27/19  Yes [provider]  Fluocinolone Acetonide Scalp 0.01 % OIL Apply 1 application topically at bedtime.  12/09/19  Yes [provider]  fluocinonide cream (LIDEX) 0.05 % Apply topically 3 (three) times daily. 11/03/19  Yes [provider]  meloxicam (  MOBIC) 15 MG tablet Take 15 mg by mouth daily as needed for pain.  10/23/16  Yes Bartholome Bill, MD  omeprazole (PRILOSEC) 40 MG capsule take 1 capsule by mouth every morning Patient taking differently: Take 40 mg by mouth daily.  02/17/16  Yes Ladene Artist, MD  ondansetron (ZOFRAN) 4 MG tablet Take 4 mg by mouth every 8 (eight) hours as needed for nausea or vomiting.  09/25/19  Yes [provider]  sertraline (ZOLOFT) 100 MG tablet Take 2 tablets (200 mg total) by mouth daily. Total daily dose= 250mg  Patient taking differently: Take 100 mg by mouth daily.  11/06/19   Yes [name], MD  sertraline (ZOLOFT) 50 MG tablet Take 1 tablet (50 mg total) by mouth daily. Total daily dose is 250mg  po qHS. Patient taking differently: Take 50 mg by mouth at bedtime.  11/06/19 11/05/20 Yes [name], MD  traMADol (ULTRAM) 50 MG tablet Take 50 mg by mouth every 12 (twelve) hours as needed for moderate pain.    Yes [provider]  traZODone (DESYREL) 100 MG tablet Take 1 tablet by mouth at bedtime to equal 150mg  total Patient taking differently: Take 100 mg by mouth at bedtime.  12/01/19  Yes [name], MD  traZODone (DESYREL) 50 MG tablet Take 1 tablet (50 mg total) by mouth at bedtime. Total dose= 350mg  qHS Patient taking differently: Take 50 mg by mouth at bedtime.  11/06/19  Yes [name], MD  Vitamin D, Ergocalciferol, (DRISDOL) 1.25 MG (50000 UNIT) CAPS capsule Take 50,000 Units by mouth once a week. 11/21/19  Yes [provider]  ziprasidone (GEODON) 80 MG capsule Take 1 capsule (80 mg total) by mouth 2 (two) times daily with a meal. Patient taking differently: Take 80 mg by mouth at bedtime.  11/06/19  Yes [name], MD  ondansetron (ZOFRAN-ODT) 4 MG disintegrating tablet Take 1 tablet (4 mg total) by mouth every 8 (eight) hours as needed for nausea or vomiting (and diarrhea). Patient not taking: Reported on 01/08/2020 01/14/16   Gatha Mayer, MD    Allergies    Penicillins and Sulfa antibiotics  Review of Systems   Review of Systems  Gastrointestinal: Positive for abdominal pain.  Neurological: Positive for weakness.  All other systems reviewed and are negative.   Physical Exam Updated Vital Signs BP (!) 178/88   Pulse 95   Temp 97.8 F (36.6 C) (Oral)   Resp (!) 21   Ht 5\' 2"  (1.575 m)   Wt 82.1 kg   SpO2 97%   BMI 33.11 kg/m   Physical Exam Vitals and nursing note reviewed.  Constitutional:      General: She is in acute distress.     Appearance: She is well-developed. She is ill-appearing.  HENT:       Head: Normocephalic and atraumatic.  Cardiovascular:     Rate and Rhythm: Regular rhythm. Tachycardia present.     Heart sounds: No murmur heard.   Pulmonary:     Effort: Pulmonary effort is normal. No respiratory distress.     Breath sounds: Normal breath sounds.     Comments: tachypnea Abdominal:     Palpations: Abdomen is soft.     Tenderness: There is abdominal tenderness. There is guarding.  Musculoskeletal:        General: No swelling or tenderness.  Skin:    General: Skin is warm and dry.  Neurological:     Mental Status: She is oriented to person, place,  and time.     Comments: Lethargic.  Arouses to verbal stimuli.  Generalized weakness.   Psychiatric:        Behavior: Behavior normal.     ED Results / Procedures / Treatments   Labs (all labs ordered are listed, but only abnormal results are displayed) Labs Reviewed  COMPREHENSIVE METABOLIC PANEL - Abnormal; Notable for the following components:      Result Value   Potassium 3.3 (*)    CO2 9 (*)    Glucose, Bld 124 (*)    Creatinine, Ser 1.15 (*)    AST 58 (*)    Total Bilirubin 1.8 (*)    GFR calc non Af Amer 50 (*)    GFR calc Af Amer 58 (*)    Anion gap 18 (*)    All other components within normal limits  CBC WITH DIFFERENTIAL/PLATELET - Abnormal; Notable for the following components:   WBC 15.5 (*)    RBC 5.51 (*)    Hemoglobin 16.7 (*)    HCT 49.9 (*)    RDW 16.6 (*)    Neutro Abs 14.1 (*)    Lymphs Abs 0.6 (*)    Abs Immature Granulocytes 0.18 (*)    All other components within normal limits  LIPASE, BLOOD - Abnormal; Notable for the following components:   Lipase 1,210 (*)    All other components within normal limits  BLOOD GAS, VENOUS - Abnormal; Notable for the following components:   pH, Ven 7.175 (*)    pCO2, Ven 24.7 (*)    pO2, Ven 47.8 (*)    Bicarbonate 8.7 (*)    Acid-base deficit 18.9 (*)    All other components within normal limits  I-STAT CHEM 8, ED - Abnormal; Notable for  the following components:   Potassium 3.4 (*)    Chloride 113 (*)    Glucose, Bld 124 (*)    TCO2 9 (*)    Hemoglobin 17.7 (*)    HCT 52.0 (*)    All other components within normal limits  RESPIRATORY PANEL BY RT PCR (FLU A&B, COVID)  CULTURE, BLOOD (SINGLE)  URINE CULTURE  CULTURE, BLOOD (SINGLE)  LACTIC ACID, PLASMA  PROTIME-INR  APTT  BRAIN NATRIURETIC PEPTIDE  LACTIC ACID, PLASMA  URINALYSIS, ROUTINE W REFLEX MICROSCOPIC  ACETAMINOPHEN LEVEL  SALICYLATE LEVEL  TROPONIN I (HIGH SENSITIVITY)  TROPONIN I (HIGH SENSITIVITY)    EKG EKG Interpretation  Date/Time:  Thursday January 08 2020 14:44:55 EDT Ventricular Rate:  97 PR Interval:    QRS Duration: 85 QT Interval:  427 QTC Calculation: 543 R Axis:   43 Text Interpretation: Sinus rhythm Left atrial enlargement Nonspecific T abnormalities, diffuse leads Prolonged QT interval No previous tracing Confirmed by Quintella Reichert [phone]) on 01/08/2020 4:58:55 PM   Radiology DG Chest Port 1 View  Result Date: 01/08/2020 CLINICAL DATA:  uncle called EMS out today complaining of extreme lethargy and weakness for 6 days. EXAM: PORTABLE CHEST 1 VIEW.  AP portable semi erect.  Patient is rotated COMPARISON:  None. FINDINGS: The heart size and mediastinal contours are within normal limits. No focal consolidation. No pulmonary edema. No pleural effusion. No pneumothorax. No acute osseous abnormality. IMPRESSION: No active disease. Electronically Signed   By: Iven Finn M.D.   On: 01/08/2020 15:53    Procedures Procedures (including critical care time) CRITICAL CARE Performed by: Quintella Reichert   Total critical care time: 45 minutes  Critical care time was exclusive of separately billable procedures and  treating other patients.  Critical care was necessary to treat or prevent imminent or life-threatening deterioration.  Critical care was time spent personally by me on the following activities: development of treatment  plan with patient and/or surrogate as well as nursing, discussions with consultants, evaluation of patient's response to treatment, examination of patient, obtaining history from patient or surrogate, ordering and performing treatments and interventions, ordering and review of laboratory studies, ordering and review of radiographic studies, pulse oximetry and re-evaluation of patient's condition.  Medications Ordered in ED Medications  meropenem (MERREM) 1 g in sodium chloride 0.9 % 100 mL IVPB (has no administration in time range)  sodium chloride 0.9 % bolus 1,000 mL (0 mLs Intravenous Stopped 01/08/20 1604)  lactated ringers bolus 2,000 mL (2,000 mLs Intravenous New Bag/Given 01/08/20 1543)  iohexol (OMNIPAQUE) 350 MG/ML injection 100 mL (100 mLs Intravenous Contrast Given 01/08/20 1600)    ED Course  I have reviewed the triage vital signs and the nursing notes.  Pertinent labs & imaging results that were available during my care of the patient were reviewed by me and considered in my medical decision making (see chart for details).    MDM Rules/Calculators/A&P                          Patient here for evaluation of abdominal pain for the last six days. She is ill appearing on evaluation with tachycardia, tachypnea and generalized abdominal tenderness. Pain is out of proportion to exam and she is confused and unable to give clear history. She was treated with IV fluids and antibiotics for concern for possible intra-abdominal infection pending further evaluation. Labs with metabolic acidosis, elevation and lipase consistent with acute pancreatitis. CT head without acute abnormality. Plan to admit for further management and workup for acute pancreatitis with altered mental status. Medicine consulted for admission. Final Clinical Impression(s) / ED Diagnoses Final diagnoses:  Acute pancreatitis, unspecified complication status, unspecified pancreatitis type  Metabolic acidosis    Rx / DC  Orders ED Discharge Orders    None       Quintella Reichert, MD 01/08/20 1730

## 2020-01-08 NOTE — ED Triage Notes (Signed)
Patient BIB [facility]. Her uncle called EMS out today complaining of extreme lethargy and weakness for 6 days.   EMS vitals BP 198/102 no history of HTN 98% Room air Afebrile Tachy at 105 Tachypnea at 28. CBG 123 Nasal capnography at 15. EMS started fluids and had almost a liter and 4mg  of  Zofran on route.  Main complaint is abdominal pain. Patient feels like she can't control her urine coming out and has not had a bowel movement in 6 days.  EMS called a code sepsis.

## 2020-01-09 DIAGNOSIS — E872 Acidosis: Secondary | ICD-10-CM | POA: Diagnosis not present

## 2020-01-09 DIAGNOSIS — F251 Schizoaffective disorder, depressive type: Secondary | ICD-10-CM

## 2020-01-09 DIAGNOSIS — F320 Major depressive disorder, single episode, mild: Secondary | ICD-10-CM | POA: Diagnosis not present

## 2020-01-09 DIAGNOSIS — D126 Benign neoplasm of colon, unspecified: Secondary | ICD-10-CM

## 2020-01-09 DIAGNOSIS — K8500 Idiopathic acute pancreatitis without necrosis or infection: Secondary | ICD-10-CM | POA: Diagnosis not present

## 2020-01-09 DIAGNOSIS — F17210 Nicotine dependence, cigarettes, uncomplicated: Secondary | ICD-10-CM | POA: Diagnosis not present

## 2020-01-09 LAB — COMPREHENSIVE METABOLIC PANEL
ALT: 31 U/L (ref 0–44)
AST: 37 U/L (ref 15–41)
Albumin: 3.3 g/dL — ABNORMAL LOW (ref 3.5–5.0)
Alkaline Phosphatase: 88 U/L (ref 38–126)
Anion gap: 14 (ref 5–15)
BUN: 12 mg/dL (ref 8–23)
CO2: 13 mmol/L — ABNORMAL LOW (ref 22–32)
Calcium: 8.9 mg/dL (ref 8.9–10.3)
Chloride: 113 mmol/L — ABNORMAL HIGH (ref 98–111)
Creatinine, Ser: 0.59 mg/dL (ref 0.44–1.00)
GFR calc Af Amer: >60 mL/min (ref >60)
GFR calc non Af Amer: >60 mL/min (ref >60)
Glucose, Bld: 101 mg/dL — ABNORMAL HIGH (ref 70–99)
Potassium: 2.9 mmol/L — ABNORMAL LOW (ref 3.5–5.1)
Sodium: 140 mmol/L (ref 135–145)
Total Bilirubin: 1.3 mg/dL — ABNORMAL HIGH (ref 0.3–1.2)
Total Protein: 6.7 g/dL (ref 6.5–8.1)

## 2020-01-09 LAB — GLUCOSE, CAPILLARY
Glucose-Capillary: 103 mg/dL — ABNORMAL HIGH (ref 70–99)
Glucose-Capillary: 103 mg/dL — ABNORMAL HIGH (ref 70–99)
Glucose-Capillary: 121 mg/dL — ABNORMAL HIGH (ref 70–99)
Glucose-Capillary: 128 mg/dL — ABNORMAL HIGH (ref 70–99)
Glucose-Capillary: 92 mg/dL (ref 70–99)
Glucose-Capillary: 96 mg/dL (ref 70–99)

## 2020-01-09 LAB — URINE CULTURE: Culture: NO GROWTH

## 2020-01-09 LAB — LACTIC ACID, PLASMA: Lactic Acid, Venous: 0.6 mmol/L (ref 0.5–1.9)

## 2020-01-09 LAB — CBC WITH DIFFERENTIAL/PLATELET
Abs Immature Granulocytes: 0.14 10*3/uL — ABNORMAL HIGH (ref 0.00–0.07)
Basophils Absolute: 0 10*3/uL (ref 0.0–0.1)
Basophils Relative: 0 %
Eosinophils Absolute: 0 10*3/uL (ref 0.0–0.5)
Eosinophils Relative: 0 %
HCT: 41.8 % (ref 36.0–46.0)
Hemoglobin: 14.5 g/dL (ref 12.0–15.0)
Immature Granulocytes: 1 %
Lymphocytes Relative: 3 %
Lymphs Abs: 0.5 10*3/uL — ABNORMAL LOW (ref 0.7–4.0)
MCH: 30.7 pg (ref 26.0–34.0)
MCHC: 34.7 g/dL (ref 30.0–36.0)
MCV: 88.4 fL (ref 80.0–100.0)
Monocytes Absolute: 0.6 10*3/uL (ref 0.1–1.0)
Monocytes Relative: 4 %
Neutro Abs: 15.1 10*3/uL — ABNORMAL HIGH (ref 1.7–7.7)
Neutrophils Relative %: 92 %
Platelets: 240 10*3/uL (ref 150–400)
RBC: 4.73 MIL/uL (ref 3.87–5.11)
RDW: 17.2 % — ABNORMAL HIGH (ref 11.5–15.5)
WBC: 16.3 10*3/uL — ABNORMAL HIGH (ref 4.0–10.5)
nRBC: 0 % (ref 0.0–0.2)

## 2020-01-09 LAB — PROCALCITONIN: Procalcitonin: <0.1 ng/mL

## 2020-01-09 LAB — LIPASE, BLOOD: Lipase: 213 U/L — ABNORMAL HIGH (ref 11–51)

## 2020-01-09 LAB — PHOSPHORUS: Phosphorus: 1.2 mg/dL — ABNORMAL LOW (ref 2.5–4.6)

## 2020-01-09 LAB — MAGNESIUM: Magnesium: 2.2 mg/dL (ref 1.7–2.4)

## 2020-01-09 LAB — LACTATE DEHYDROGENASE: LDH: 141 U/L (ref 98–192)

## 2020-01-09 MED ORDER — LACTATED RINGERS IV SOLN: INTRAVENOUS | Status: DC

## 2020-01-09 MED ORDER — POTASSIUM PHOSPHATES 15 MMOLE/5ML IV SOLN
40.0000 mmol | Freq: Once | INTRAVENOUS | Status: AC
  Administered 2020-01-09: 40 mmol via INTRAVENOUS
  Filled 2020-01-09: qty 13.33

## 2020-01-09 MED ORDER — LIP MEDEX EX OINT
TOPICAL_OINTMENT | CUTANEOUS | Status: DC | PRN
  Filled 2020-01-09 (×3): qty 7

## 2020-01-09 MED ORDER — SODIUM BICARBONATE 8.4 % IV SOLN
50.0000 meq | Freq: Once | INTRAVENOUS | Status: AC
  Administered 2020-01-09: 50 meq via INTRAVENOUS
  Filled 2020-01-09: qty 50

## 2020-01-09 MED ORDER — HYDRALAZINE HCL 20 MG/ML IJ SOLN
7.0000 mg | Freq: Four times a day (QID) | INTRAMUSCULAR | Status: DC
  Administered 2020-01-09 – 2020-01-15 (×22): 7 mg via INTRAVENOUS
  Filled 2020-01-09 (×22): qty 1

## 2020-01-09 NOTE — Progress Notes (Signed)
PROGRESS NOTE    [name]  WER:[id number] DOB: Aug 20, 1955 DOA: 01/08/2020 PCP: Bartholome Bill, MD     Brief Narrative:  [name] is a 64 y.o. WF PMHx depression, schizoaffective DO, myocarditis, irritable bowel syndrome, prolapsed internal hemorrhoids grade 3, tubular adenoma of colon, diverticulosis.  presents to the Emergency Department complaining of abdominal pain. Level V caveat due to altered mental status. History is provided by the patient and EMS. She presents by EMS from home for evaluation of six days of generalized and lower abdominal pain with associated constipation and vomiting. She has associated cough, shortness of breath and generalized weakness. No fevers. She denies any dysuria. She does report urinating less. EMS activated a code sepsis prior to ED arrival due to tachycardia, tachypnea andend tidalcapnography of15.She denies tobacco, alcohol, drug use. She is unsure of her home meds. Takes occasional    Subjective: Patient somewhat groggy but A/O x4.  Extremely uncomfortable.  Trying to manipulate her way into getting someone to give her something to drink and eat.   Assessment & Plan: Covid vaccination;   Active Problems:   Schizoaffective disorder, depressive type (HCC)   Cigarette nicotine dependence without complication   Prolapsed internal hemorrhoids, grade 3   Acute pancreatitis   Depression   Diverticulosis   Tubular adenoma of colon   Sepsis, unspecified organism ([place])   Sepsis unspecified organism -On admission patient meets criteria for sepsis RR> 22, WBC> 12, area of infection pancreas. -Lactic acid Results for Joyce, Keith (MRN [id number]) as of 01/09/2020 15:54  Ref. Range 01/08/2020 14:53 01/08/2020 17:26 01/08/2020 19:07 01/08/2020 23:41  Lactic Acid, Venous Latest Ref Range: 0.5 - 1.9 mmol/L 0.8 0.6 0.6 0.6  -Procalcitonin Results for Joyce, Keith (MRN [id number]) as of 01/09/2020 15:54  Ref. Range 01/08/2020  19:07 01/09/2020 08:59  Procalcitonin Latest Units: ng/mL <0.10 <0.10  -Sepsis ruled out, lactic acid and procalcitonin negative will discontinue antibiotic.  Acute pancreatitis -10/1 DC normal saline 129ml/hr -10/1 lactated Ringer 186ml/hr -Strict in and out +3.7 L -Daily weight Filed Weights   01/08/20 1447 01/08/20 2349  Weight: 82.1 kg 73.5 kg  -Titrate O2 to maintain SPO2> 95% -Calculate Ranson's criteria for mortality in the a.m.= 2 -Zofran PRN -9/30 EtOH and urine toxicology negative -10/2 ABG pending  Metabolic acidosis -1 amp sodium bicarb  Essential HTN -Metoprolol IV 5 mg TID -Hydralazine IV 7 mg QID -Hydralazine IV 5 mg QID hold for MAP<65  Hypokalemia -Potassium goal> 4 -10/1 K-Phos IV 40 mmol  Hypophosphatemia -Phosphorus goal> 2.5 -See hypokalemia  Hyperglycemia -Sensitive SSI  Schizoaffective disorder/depression -Benztropine 1 mg daily -Zoloft 100 mg daily -Geodon 80 mg qhs  Tobacco abuse -Nicotine patch   DVT prophylaxis: Lovenox Code Status: Full Family Communication: 9/10 spoke with Chase Picket (Uncle) explained plan of care answered all questions Status is: Inpatient    Dispo: The patient is from:               Anticipated d/c is to: Home              Anticipated d/c date is: Home              Patient currently unstable      Consultants:    Procedures/Significant Events:  9/30 CXR; no active disease 9/30 chest/abdomen/pelvis;- acute uncomplicated pancreatitis with moderate to large amount of peripancreatic stranding, but without definable/drainable fluid collection/pseudocyst and no definitive evidence of pancreatic necrosis. -. Post cholecystectomy. -. 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 9/30 CT Head WO Contrast; No CT evidence for acute intracranial  abnormality  I have personally reviewed and interpreted all radiology studies and my findings are as above.  VENTILATOR SETTINGS:    Cultures 9/30 respiratory virus panel SARS coronavirus negative 9/30 influenza A/B negative 9/30 blood LEFT forearm NGTD 9/30 blood RIGHT AC NGTD 9/30 urine pending    Antimicrobials: Anti-infectives (From admission, onward)   Start     Ordered Stop   01/08/20 1600  meropenem (MERREM) 1 g in sodium chloride 0.9 % 100 mL IVPB  Status:  Discontinued        01/08/20 1540 01/09/20 1338       Devices    LINES / TUBES:      Continuous Infusions: . sodium chloride 100 mL/hr at 01/09/20 0446  . meropenem (MERREM) IV 1 g (01/08/20 2254)     Objective: Vitals:   01/09/20 0159 01/09/20 0235 01/09/20 0431 01/09/20 0614  BP: (!) 175/87 (!) 153/78 (!) 170/86 (!) 164/86  Pulse: 90 88 92 86  Resp:   18   Temp:   98.3 F (36.8 C)   TempSrc:   Oral   SpO2: 97%  97%   Weight:      Height:        Intake/Output Summary (Last 24 hours) at 01/09/2020 0735 Last data filed at 01/09/2020 0600 Gross per 24 hour  Intake 3725.16 ml  Output 1000 ml  Net 2725.16 ml   Filed Weights   01/08/20 1447 01/08/20 2349  Weight: 82.1 kg 73.5 kg    Examination:  General: Groggy but arousable A/O x4, No acute respiratory distress Eyes: negative scleral hemorrhage, negative anisocoria, negative icterus ENT: Negative Runny nose, negative gingival bleeding, Neck:  Negative scars, masses, torticollis, lymphadenopathy, JVD Lungs: Clear to auscultation bilaterally without wheezes or crackles Cardiovascular: Regular rate and rhythm without murmur gallop or rub normal S1 and S2 Abdomen: Positive generalized abdominal pain, positive mild distention, positive soft, bowel sounds, no rebound, no ascites, no appreciable mass Extremities: No significant cyanosis, clubbing, or edema bilateral lower extremities Skin: Negative rashes, lesions, ulcers Psychiatric:   Negative depression, negative anxiety, negative fatigue, negative mania  Central nervous system:  Cranial nerves II through XII intact, tongue/uvula midline, all extremities muscle strength 5/5, sensation intact throughout, negative dysarthria, negative expressive aphasia, negative receptive aphasia.  .     Data Reviewed: Care during the described time interval was provided by me .  I have reviewed this patient's available data, including medical history, events of note, physical examination, and all test results as part of my evaluation.  CBC: Recent Labs  Lab 01/08/20 1449 01/08/20 1511 01/08/20 1907  WBC 15.5*  --  13.6*  NEUTROABS 14.1*  --   --   HGB 16.7* 17.7* 15.4*  HCT 49.9* 52.0* 45.3  MCV 90.6  --  89.5  PLT 326  --  161   Basic Metabolic Panel: Recent Labs  Lab 01/08/20 1449 01/08/20 1511 01/08/20 1907  NA 136 139  --   K 3.3* 3.4*  --   CL 109 113*  --   CO2 9*  --   --   GLUCOSE 124* 124*  --   BUN 22 22  --   CREATININE 1.15* 0.70 0.84  CALCIUM 9.3  --   --    GFR: Estimated Creatinine  Clearance: 63.6 mL/min (by C-G formula based on SCr of 0.84 mg/dL). Liver Function Tests: Recent Labs  Lab 01/08/20 1449  AST 58*  ALT 41  ALKPHOS 109  BILITOT 1.8*  PROT 7.9  ALBUMIN 4.3   Recent Labs  Lab 01/08/20 1449  LIPASE 1,210*   No results for input(s): AMMONIA in the last 168 hours. Coagulation Profile: Recent Labs  Lab 01/08/20 1449  INR 1.1   Cardiac Enzymes: No results for input(s): CKTOTAL, CKMB, CKMBINDEX, TROPONINI in the last 168 hours. BNP (last 3 results) No results for input(s): PROBNP in the last 8760 hours. HbA1C: Recent Labs    01/08/20 1907  HGBA1C 5.0   CBG: Recent Labs  Lab 01/09/20 0006 01/09/20 0429  GLUCAP 92 103*   Lipid Profile: No results for input(s): CHOL, HDL, LDLCALC, TRIG, CHOLHDL, LDLDIRECT in the last 72 hours. Thyroid Function Tests: No results for input(s): TSH, T4TOTAL, FREET4, T3FREE, THYROIDAB in  the last 72 hours. Anemia Panel: No results for input(s): VITAMINB12, FOLATE, FERRITIN, TIBC, IRON, RETICCTPCT in the last 72 hours. Sepsis Labs: Recent Labs  Lab 01/08/20 1453 01/08/20 1726 01/08/20 1907 01/08/20 2341  PROCALCITON  --   --  <0.10  --   LATICACIDVEN 0.8 0.6 0.6 0.6    Recent Results (from the past 240 hour(s))  Blood culture (routine single)     Status: None (Preliminary result)   Collection Time: 01/08/20  2:49 PM   Specimen: BLOOD LEFT FOREARM  Result Value Ref Range Status   Specimen Description   Final    BLOOD LEFT FOREARM Performed at [facility] [address] [place], [place] [address]    Special Requests   Final    BOTTLES DRAWN AEROBIC AND ANAEROBIC Blood Culture adequate volume Performed at [facility] [address], [place], [place] [address]    Culture   Final    NO GROWTH < 12 HOURS Performed at [facility] [address], [place],  [address]    Report Status PENDING  Incomplete  Respiratory Panel by RT PCR (Flu A&B, Covid) - Nasopharyngeal Swab     Status: None   Collection Time: 01/08/20  2:53 PM   Specimen: Nasopharyngeal Swab  Result Value Ref Range Status   SARS Coronavirus 2 by RT PCR NEGATIVE NEGATIVE Final    Comment: (NOTE) SARS-CoV-2 target nucleic acids are NOT DETECTED.  The SARS-CoV-2 RNA is generally detectable in upper respiratoy specimens during the acute phase of infection. The lowest concentration of SARS-CoV-2 viral copies this assay can detect is 131 copies/mL. A negative result does not preclude SARS-Cov-2 infection and should not be used as the sole basis for treatment or other patient management decisions. A negative result may occur with  improper specimen collection/handling, submission of specimen other than nasopharyngeal swab, presence of viral mutation(s) within the areas targeted by this assay, and inadequate number of viral copies (<131 copies/mL).  A negative result must be combined with clinical observations, patient history, and epidemiological information. The expected result is Negative.  Fact Sheet for Patients:  PinkCheek.be  Fact Sheet for Healthcare Providers:  GravelBags.it  This test is no t yet approved or cleared by the [place] FDA and  has been authorized for detection and/or diagnosis of SARS-CoV-2 by FDA under an Emergency Use Authorization (EUA). This EUA will remain  in effect (meaning this test can be used) for the duration of the COVID-19 declaration under Section 564(b)(1) of the Act,  21 U.S.C. section 360bbb-3(b)(1), unless the authorization is terminated or revoked sooner.     Influenza A by PCR NEGATIVE NEGATIVE Final   Influenza B by PCR NEGATIVE NEGATIVE Final    Comment: (NOTE) The Xpert Xpress SARS-CoV-2/FLU/RSV assay is intended as an aid in  the diagnosis of influenza from Nasopharyngeal swab specimens and  should not be used as a sole basis for treatment. Nasal washings and  aspirates are unacceptable for Xpert Xpress SARS-CoV-2/FLU/RSV  testing.  Fact Sheet for Patients: PinkCheek.be  Fact Sheet for Healthcare Providers: GravelBags.it  This test is not yet approved or cleared by the [place] FDA and  has been authorized for detection and/or diagnosis of SARS-CoV-2 by  FDA under an Emergency Use Authorization (EUA). This EUA will remain  in effect (meaning this test can be used) for the duration of the  Covid-19 declaration under Section 564(b)(1) of the Act, 21  U.S.C. section 360bbb-3(b)(1), unless the authorization is  terminated or revoked. Performed at [facility], [place] [address], [place], [place] [address]   Culture, blood (single)     Status: None (Preliminary result)   Collection Time: 01/08/20  3:41 PM   Specimen: BLOOD  Result Value  Ref Range Status   Specimen Description   Final    BLOOD RIGHT ANTECUBITAL Performed at [facility] [address] [place], [place] [address]    Special Requests   Final    BOTTLES DRAWN AEROBIC AND ANAEROBIC Blood Culture results may not be optimal due to an excessive volume of blood received in culture bottles Performed at [facility] [address], [place], [place] [address]    Culture   Final    NO GROWTH < 12 HOURS Performed at [facility] [address], [place], [place] [address]    Report Status PENDING  Incomplete         Radiology Studies: CT Head Wo Contrast  Result Date: 01/08/2020 CLINICAL DATA:  Mental status change extreme lethargy and weakness EXAM: CT HEAD WITHOUT CONTRAST TECHNIQUE: Contiguous axial images were obtained from the base of the skull through the vertex without intravenous contrast. COMPARISON:  None. FINDINGS: Brain: No acute territorial infarction, hemorrhage or intracranial mass. Minimal white matter hypodensity on the right. Nonenlarged ventricles. Vascular: No hyperdense vessels.  No unexpected calcification. Skull: Normal. Negative for fracture or focal lesion. Sinuses/Orbits: No acute finding. Other: None IMPRESSION: 1. No CT evidence for acute intracranial abnormality. 2. Minimal white matter hypodensity on the right, may reflect small vessel ischemic change. Electronically Signed   By: Donavan Foil M.D.   On: 01/08/2020 16:35   DG Chest Port 1 View  Result Date: 01/08/2020 CLINICAL DATA:  uncle called EMS out today complaining of extreme lethargy and weakness for 6 days. EXAM: PORTABLE CHEST 1 VIEW.  AP portable semi erect.  Patient is rotated COMPARISON:  None. FINDINGS: The heart size and mediastinal contours are within normal limits. No focal consolidation. No pulmonary edema. No pleural effusion. No pneumothorax. No acute osseous abnormality. IMPRESSION: No active disease. Electronically Signed    By: Iven Finn M.D.   On: 01/08/2020 15:53   CT Angio Chest/Abd/Pel for Dissection W and/or W/WO  Result Date: 01/08/2020 CLINICAL DATA:  Chest and abdominal pain. Evaluate for thoracic/abdominal dissection. EXAM: CT ANGIOGRAPHY CHEST, ABDOMEN AND PELVIS TECHNIQUE: Non-contrast CT of the chest was initially obtained. Multidetector CT imaging through the chest, abdomen and pelvis was performed using the standard  protocol during bolus administration of intravenous contrast. Multiplanar reconstructed images and MIPs were obtained and reviewed to evaluate the vascular anatomy. CONTRAST:  166mL OMNIPAQUE IOHEXOL 350 MG/ML SOLN COMPARISON:  CT abdomen and pelvis - 05/02/2019 FINDINGS: CTA CHEST FINDINGS Vascular Findings: Evaluation of the aortic root and proximal aspect of the ascending thoracic aorta is degraded secondary to pulsation artifact. Given this limitation, there is no evidence of thoracic aortic aneurysm or dissection with measurements as follows. Review of the precontrast images is negative for the presence of an intramural hematoma. Atherosclerotic plaque involving the aortic arch and distal aspect of the descending thoracic aorta, not resulting in hemodynamically significant stenosis. Bovine configuration of the aortic arch. The branch vessels of the aortic arch appear widely patent throughout their imaged courses. Normal heart size.  No pericardial effusion. Although this examination was not tailored for the evaluation the pulmonary arteries, there are no discrete filling defects within the central pulmonary arterial tree to suggest central pulmonary embolism. Normal caliber of the main pulmonary artery. ------------------------------------------------------------- Thoracic aortic measurements: Sinotubular junction 26 mm as measured in greatest oblique short axis coronal dimension. Proximal ascending aorta 38 mm in greatest oblique short axis axial diameter (axial image 29, series 6); 36 mm in  greatest oblique short axis coronal diameter (coronal image 92, series 4). Aortic arch aorta 32 mm as measured in greatest oblique short axis sagittal dimension. Proximal descending thoracic aorta 28 mm as measured in greatest oblique short axis axial dimension at the level of the main pulmonary artery. Distal descending thoracic aorta 23 mm as measured in greatest oblique short axis axial dimension at the level of the diaphragmatic hiatus. Review of the MIP images confirms the above findings. ------------------------------------------------------------- Non-Vascular Findings: Mediastinum/Lymph Nodes: No bulky mediastinal, hilar or axillary lymphadenopathy. Lungs/Pleura: Minimal subsegmental atelectasis within the left lower lobe. No discrete focal airspace opacities. No pleural effusion or pneumothorax. The central pulmonary airways appear widely patent. No discrete pulmonary nodules. Musculoskeletal: No acute or aggressive osseous abnormalities. Regional soft tissues appear normal. The thyroid appears somewhat atrophic but without discrete nodule. _________________________________________________________ _________________________________________________________ CTA ABDOMEN AND PELVIS FINDINGS VASCULAR Aorta: Moderate amount of eccentric predominantly calcified atherosclerotic plaque within a normal caliber abdominal aorta, not resulting in a hemodynamically significant stenosis. No evidence of abdominal aortic dissection or periaortic stranding. Celiac: Widely patent without hemodynamically significant narrowing. Conventional branching pattern. 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 SMA: Widely patent without a hemodynamically significant narrowing.  Conventional branching pattern. The distal tributaries of the SMA are widely patent without discrete lumen filling defect to suggest distal embolism. Renals: Note is made of a tiny accessory left renal artery which supplies the inferior pole the left kidney. Solitary right renal artery. There is a moderate amount of eccentric predominantly calcified atherosclerotic plaque involving the origin and proximal aspects of the bilateral renal arteries, not resulting in a hemodynamically significant stenosis. No vessel irregularity to suggest FMD. IMA: Remains patent. Inflow: Minimal amount of eccentric mixed calcified and noncalcified atherosclerotic plaque involves the bilateral normal caliber common iliac arteries, not resulting in hemodynamically significant stenosis. The bilateral internal iliac arteries are mildly disease though patent and of normal caliber. The bilateral external iliac arteries are tortuous but widely patent without hemodynamically significant narrowing. Veins: The  IVC and pelvic venous systems appear patent on this arterial phase examination. Review of the MIP images confirms the above findings. _________________________________________________________ Evaluation of abdominal organs is limited to the arterial phase of enhancement. NON-VASCULAR Hepatobiliary: Normal hepatic contour. There is diffuse decreased attenuation hepatic parenchyma suggestive of hepatic steatosis. No discrete hyperenhancing hepatic lesions. Post cholecystectomy. No intra or extrahepatic biliary ductal dilatation. No ascites. Pancreas: There is rather extensive inflammatory change about the pancreatic bed without definable/drainable fluid collection/pseudocyst. There is apparent homogeneous enhancement of the pancreatic parenchyma without definitive evidence of pancreatic necrosis. Suspected minimal amount of vasospasm involving the distal aspect of the splenic artery however both the splenic artery and vein remain patent.  Spleen: Normal appearance of the spleen. Note is made of a splenule about the anterior inferior aspect of the spleen. Adrenals/Urinary Tract: Homogeneous enhancement of the bilateral kidneys. Note is made of an approximately 6.8 cm hypoattenuating partially exophytic cyst arising from the inferior pole of the left kidney. No discrete right-sided renal lesions. No definite evidence of nephrolithiasis on this postcontrast examination. No urinary obstruction. Normal appearance of the bilateral adrenal glands. Normal appearance of the urinary bladder given degree of distention. Stomach/Bowel: Moderate colonic stool burden without evidence of enteric obstruction. No discrete areas of bowel wall thickening. Normal appearance of the terminal ileum and appendix. No pneumoperitoneum, pneumatosis or portal venous gas. Lymphatic: No bulky retroperitoneal, mesenteric, pelvic or inguinal lymphadenopathy. Reproductive: Normal appearance of the pelvic organs for age. No discrete adnexal lesion. No free fluid the pelvic cul-de-sac. Other: Minimal amount of subcutaneous edema about the midline of the low back. Musculoskeletal: No acute or aggressive osseous abnormalities. Moderate to severe multilevel lumbar spine DDD, worse at L3-L4 and L5-S1 with disc space height loss, endplate irregularity and sclerosis. Small posteriorly directed disc osteophyte complex is also seen at T11-T12. Review of the MIP images confirms the above findings. IMPRESSION: Chest CTA impression: 1. No acute cardiopulmonary disease. Specifically, no evidence of thoracic aortic aneurysm or dissection on this motion degraded examination. Abdomen and pelvic CTA impression: 1. Findings suggestive of acute uncomplicated pancreatitis with moderate to large amount of peripancreatic stranding, but without definable/drainable fluid collection/pseudocyst and no definitive evidence of pancreatic necrosis. 2. Post cholecystectomy. 3. Scattered atherosclerotic plaque  within a normal caliber abdominal aorta. Aortic Atherosclerosis (ICD10-I70.0). 4. 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 Electronically Signed   By: Sandi Mariscal M.D.   On: 01/08/2020 17:11        Scheduled Meds: . benztropine  1 mg Oral Daily  . enoxaparin (LOVENOX) injection  40 mg Subcutaneous Q24H  . hydrALAZINE  5 mg Intravenous Q6H  . insulin aspart  0-9 Units Subcutaneous Q4H  . metoprolol tartrate  5 mg Intravenous Q8H  . nicotine  14 mg Transdermal Daily  . sertraline  100 mg Oral Daily  . ziprasidone  80 mg Oral QHS   Continuous Infusions: . sodium chloride 100 mL/hr at 01/09/20 0446  . meropenem (MERREM) IV 1 g (01/08/20 2254)     LOS: 1 day    Time spent:40 min    Prince Couey, Geraldo Docker, MD [facility] Pager [phone]  If 7PM-7AM, please contact night-coverage [email or web address] Password [facility] 01/09/2020, 7:35 AM

## 2020-01-09 NOTE — Plan of Care (Signed)

## 2020-01-10 DIAGNOSIS — E872 Acidosis: Secondary | ICD-10-CM | POA: Diagnosis not present

## 2020-01-10 DIAGNOSIS — K8500 Idiopathic acute pancreatitis without necrosis or infection: Secondary | ICD-10-CM | POA: Diagnosis not present

## 2020-01-10 DIAGNOSIS — F17210 Nicotine dependence, cigarettes, uncomplicated: Secondary | ICD-10-CM | POA: Diagnosis not present

## 2020-01-10 DIAGNOSIS — F320 Major depressive disorder, single episode, mild: Secondary | ICD-10-CM | POA: Diagnosis not present

## 2020-01-10 LAB — CBC WITH DIFFERENTIAL/PLATELET
Abs Immature Granulocytes: 0.08 10*3/uL — ABNORMAL HIGH (ref 0.00–0.07)
Basophils Absolute: 0 10*3/uL (ref 0.0–0.1)
Basophils Relative: 0 %
Eosinophils Absolute: 0 10*3/uL (ref 0.0–0.5)
Eosinophils Relative: 0 %
HCT: 36.8 % (ref 36.0–46.0)
Hemoglobin: 12.8 g/dL (ref 12.0–15.0)
Immature Granulocytes: 1 %
Lymphocytes Relative: 6 %
Lymphs Abs: 0.8 10*3/uL (ref 0.7–4.0)
MCH: 30.4 pg (ref 26.0–34.0)
MCHC: 34.8 g/dL (ref 30.0–36.0)
MCV: 87.4 fL (ref 80.0–100.0)
Monocytes Absolute: 0.5 10*3/uL (ref 0.1–1.0)
Monocytes Relative: 4 %
Neutro Abs: 11.5 10*3/uL — ABNORMAL HIGH (ref 1.7–7.7)
Neutrophils Relative %: 89 %
Platelets: 168 10*3/uL (ref 150–400)
RBC: 4.21 MIL/uL (ref 3.87–5.11)
RDW: 17 % — ABNORMAL HIGH (ref 11.5–15.5)
WBC: 12.9 10*3/uL — ABNORMAL HIGH (ref 4.0–10.5)
nRBC: 0 % (ref 0.0–0.2)

## 2020-01-10 LAB — BLOOD GAS, ARTERIAL
Acid-base deficit: 0.8 mmol/L (ref 0.0–2.0)
Bicarbonate: 22.8 mmol/L (ref 20.0–28.0)
Drawn by: 11249
FIO2: 21
O2 Saturation: 94.7 %
Patient temperature: 98.6
pCO2 arterial: 36.6 mmHg (ref 32.0–48.0)
pH, Arterial: 7.411 (ref 7.350–7.450)
pO2, Arterial: 70.1 mmHg — ABNORMAL LOW (ref 83.0–108.0)

## 2020-01-10 LAB — GLUCOSE, CAPILLARY
Glucose-Capillary: 107 mg/dL — ABNORMAL HIGH (ref 70–99)
Glucose-Capillary: 110 mg/dL — ABNORMAL HIGH (ref 70–99)
Glucose-Capillary: 112 mg/dL — ABNORMAL HIGH (ref 70–99)
Glucose-Capillary: 82 mg/dL (ref 70–99)
Glucose-Capillary: 89 mg/dL (ref 70–99)
Glucose-Capillary: 94 mg/dL (ref 70–99)
Glucose-Capillary: 97 mg/dL (ref 70–99)

## 2020-01-10 LAB — COMPREHENSIVE METABOLIC PANEL
ALT: 26 U/L (ref 0–44)
AST: 26 U/L (ref 15–41)
Albumin: 3.1 g/dL — ABNORMAL LOW (ref 3.5–5.0)
Alkaline Phosphatase: 78 U/L (ref 38–126)
Anion gap: 15 (ref 5–15)
BUN: 7 mg/dL — ABNORMAL LOW (ref 8–23)
CO2: 21 mmol/L — ABNORMAL LOW (ref 22–32)
Calcium: 9 mg/dL (ref 8.9–10.3)
Chloride: 104 mmol/L (ref 98–111)
Creatinine, Ser: 0.45 mg/dL (ref 0.44–1.00)
GFR calc Af Amer: >60 mL/min (ref >60)
GFR calc non Af Amer: >60 mL/min (ref >60)
Glucose, Bld: 97 mg/dL (ref 70–99)
Potassium: 2.1 mmol/L — CL (ref 3.5–5.1)
Sodium: 140 mmol/L (ref 135–145)
Total Bilirubin: 1.5 mg/dL — ABNORMAL HIGH (ref 0.3–1.2)
Total Protein: 6.2 g/dL — ABNORMAL LOW (ref 6.5–8.1)

## 2020-01-10 LAB — PHOSPHORUS
Phosphorus: 1.2 mg/dL — ABNORMAL LOW (ref 2.5–4.6)
Phosphorus: 2.5 mg/dL (ref 2.5–4.6)

## 2020-01-10 LAB — MAGNESIUM
Magnesium: 2 mg/dL (ref 1.7–2.4)
Magnesium: 1.9 mg/dL (ref 1.7–2.4)

## 2020-01-10 LAB — PROCALCITONIN: Procalcitonin: <0.1 ng/mL

## 2020-01-10 LAB — POTASSIUM: Potassium: 2.4 mmol/L — CL (ref 3.5–5.1)

## 2020-01-10 LAB — LIPASE, BLOOD: Lipase: 75 U/L — ABNORMAL HIGH (ref 11–51)

## 2020-01-10 LAB — LACTATE DEHYDROGENASE: LDH: 144 U/L (ref 98–192)

## 2020-01-10 MED ORDER — POTASSIUM CHLORIDE 10 MEQ/100ML IV SOLN
10.0000 meq | INTRAVENOUS | Status: AC
  Administered 2020-01-10 (×6): 10 meq via INTRAVENOUS
  Filled 2020-01-10 (×5): qty 100

## 2020-01-10 MED ORDER — POTASSIUM PHOSPHATES 15 MMOLE/5ML IV SOLN
40.0000 mmol | Freq: Once | INTRAVENOUS | Status: AC
  Administered 2020-01-10: 40 mmol via INTRAVENOUS
  Filled 2020-01-10: qty 13.33

## 2020-01-10 MED ORDER — POTASSIUM CHLORIDE CRYS ER 20 MEQ PO TBCR
40.0000 meq | EXTENDED_RELEASE_TABLET | Freq: Once | ORAL | Status: AC
  Administered 2020-01-10: 40 meq via ORAL
  Filled 2020-01-10: qty 2

## 2020-01-10 NOTE — Progress Notes (Signed)
CRITICAL VALUE ALERT  Critical Value:  Potassium 2.1  Date & Time Notied:  01/10/20  2300  Provider Notified: Dia Crawford MD text/paged/messaged [phone]  Orders Received/Actions taken: Message seen at 1002. Order placed 1013 for potassium phosphate IVPB. Hung at 1112 as soon as rec'd from pharmacy.

## 2020-01-10 NOTE — Progress Notes (Signed)
PROGRESS NOTE    [name]  JJK:[id number] DOB: Aug 29, 1955 DOA: 01/08/2020 PCP: Joyce Bill, MD     Brief Narrative:  [name] is a 64 y.o. WF PMHx depression, schizoaffective DO, myocarditis, irritable bowel syndrome, prolapsed internal hemorrhoids grade 3, tubular adenoma of colon, diverticulosis.  presents to the Emergency Department complaining of abdominal pain. Level V caveat due to altered mental status. History is provided by the patient and EMS. She presents by EMS from home for evaluation of six days of generalized and lower abdominal pain with associated constipation and vomiting. She has associated cough, shortness of breath and generalized weakness. No fevers. She denies any dysuria. She does report urinating less. EMS activated a code sepsis prior to ED arrival due to tachycardia, tachypnea andend tidalcapnography of15.She denies tobacco, alcohol, drug use. She is unsure of her home meds. Takes occasional    Subjective: 10/2 afebrile overnight A/O x4, much more awake today.  Complaining about everything from the remote control on the television to, too much noise in the hospital.   Assessment & Plan: Covid vaccination;   Active Problems:   Schizoaffective disorder, depressive type (HCC)   Cigarette nicotine dependence without complication   Prolapsed internal hemorrhoids, grade 3   Acute pancreatitis   Depression   Diverticulosis   Tubular adenoma of colon   Sepsis, unspecified organism ([place])   Sepsis unspecified organism -On admission patient meets criteria for sepsis RR> 22, WBC> 12, area of infection pancreas. -Lactic acid Results for Joyce, Keith (MRN [id number]) as of 01/09/2020 15:54  Ref. Range 01/08/2020 14:53 01/08/2020 17:26 01/08/2020 19:07 01/08/2020 23:41  Lactic Acid, Venous Latest Ref Range: 0.5 - 1.9 mmol/L 0.8 0.6 0.6 0.6  -Procalcitonin Results for Joyce, Keith (MRN [id number]) as of 01/09/2020 15:54  Ref. Range  01/08/2020 19:07 01/09/2020 08:59  Procalcitonin Latest Units: ng/mL <0.10 <0.10  -Sepsis ruled out, lactic acid and procalcitonin negative will discontinue antibiotic.  Acute pancreatitis Lab Results  Component Value Date   LIPASE 75 (H) 01/10/2020   LIPASE 213 (H) 01/09/2020   LIPASE 1,210 (H) 01/08/2020   LIPASE 17 09/14/2008   LIPASE 12 12/18/2006  -10/1 DC normal saline 111ml/hr -10/1 lactated Ringer 122ml/hr -Strict in and out +3.9 L -Daily weight Filed Weights   01/08/20 1447 01/08/20 2349 01/10/20 0500  Weight: 82.1 kg 73.5 kg 70.4 kg  -Titrate O2 to maintain SPO2> 95% -Calculate Ranson's criteria for mortality in the a.m.= 2 -Zofran PRN -9/30 EtOH and urine toxicology negative -10/2 ABG pending  Metabolic acidosis -1 amp sodium bicarb  Essential HTN -Metoprolol IV 5 mg TID -Hydralazine IV 7 mg QID -Hydralazine IV 5 mg QID hold for MAP<65  Hypokalemia -Potassium goal> 4 -10/2 K-Phos IV 40 mmol  -10/2 repeat K/Mg/PO4 @1600 ; potassium= 2.4---> potassium IV 60 mEq + K-Dur 40 mEq  Hypophosphatemia -Phosphorus goal> 2.5 -See hypokalemia  Hyperglycemia -Sensitive SSI  Schizoaffective disorder/depression -Benztropine 1 mg daily -Zoloft 100 mg daily -Geodon 80 mg qhs  Tobacco abuse -Nicotine patch   DVT prophylaxis: Lovenox Code Status: Full Family Communication: 9/30 spoke with [name] [facility]) explained plan of care answered all questions Status is: Inpatient    Dispo: The patient is from:               Anticipated d/c is to: Home              Anticipated d/c date is: Home  Patient currently unstable      Consultants:    Procedures/Significant Events:  9/30 CXR; no active disease 9/30 chest/abdomen/pelvis;- acute uncomplicated pancreatitis with moderate to large amount of peripancreatic stranding, but without definable/drainable fluid collection/pseudocyst and no definitive evidence of pancreatic necrosis. -. Post  cholecystectomy. -. 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 9/30 CT Head WO Contrast; No CT evidence for acute intracranial abnormality  I have personally reviewed and interpreted all radiology studies and my findings are as above.  VENTILATOR SETTINGS: Room air 10/2 SpO2 94%   Cultures 9 9/30 respiratory virus panel SARS coronavirus negative 9/30 influenza A/B negative 9/30 blood LEFT forearm NGTD 9/30 blood RIGHT AC NGTD 9/30 urine pending    Antimicrobials: Anti-infectives (From admission, onward)   Start     Ordered Stop   01/08/20 1600  meropenem (MERREM) 1 g in sodium chloride 0.9 % 100 mL IVPB  Status:  Discontinued        01/08/20 1540 01/09/20 1338       Devices    LINES / TUBES:      Continuous Infusions: . lactated ringers 100 mL/hr at 01/10/20 0344     Objective: Vitals:   01/09/20 1947 01/10/20 0003 01/10/20 0430 01/10/20 0500  BP: (!) 166/100 (!) 188/81 (!) 160/80   Pulse: 85 77 68   Resp: 20  14   Temp:   98.6 F (37 C)   TempSrc:   Oral   SpO2: 96%  94%   Weight:    70.4 kg  Height:        Intake/Output Summary (Last 24 hours) at 01/10/2020 0859 Last data filed at 01/10/2020 4742 Gross per 24 hour  Intake 2517.41 ml  Output 1300 ml  Net 1217.41 ml   Filed Weights   01/08/20 1447 01/08/20 2349 01/10/20 0500  Weight: 82.1 kg 73.5 kg 70.4 kg   Physical Exam:  General: A/O x4, No acute respiratory distress Eyes: negative scleral hemorrhage, negative anisocoria, negative icterus ENT: Negative Runny nose, negative gingival bleeding, Neck:  Negative scars, masses, torticollis, lymphadenopathy, JVD Lungs: Clear to auscultation bilaterally without wheezes or crackles Cardiovascular: Regular rate and rhythm without murmur gallop or rub normal S1 and S2 Abdomen: Positive generalized  abdominal pain but improved from 10/1, positive distention, positive soft, bowel sounds, no rebound, no ascites, no appreciable mass Extremities: No significant cyanosis, clubbing, or edema bilateral lower extremities Skin: Negative rashes, lesions, ulcers Psychiatric:  Negative depression, positive anxiety, negative fatigue, positive mania  Central nervous system:  Cranial nerves II through XII intact, tongue/uvula midline, all extremities muscle strength 5/5, sensation intact throughout, negative dysarthria, negative expressive aphasia, negative receptive aphasia.  .     Data Reviewed: Care during the described time interval was provided by me .  I have reviewed this patient's available data, including medical history, events of note, physical examination, and all test results as part of my evaluation.  CBC: Recent Labs  Lab 01/08/20 1449 01/08/20 1511 01/08/20 1907 01/09/20 0859 01/10/20 0644  WBC 15.5*  --  13.6* 16.3* 12.9*  NEUTROABS 14.1*  --   --  15.1* 11.5*  HGB 16.7* 17.7* 15.4* 14.5 12.8  HCT 49.9* 52.0* 45.3 41.8 36.8  MCV 90.6  --  89.5 88.4 87.4  PLT 326  --  254 240 595   Basic Metabolic Panel: Recent Labs  Lab 01/08/20  1449 01/08/20 1511 01/08/20 1907 01/09/20 0859  NA 136 139  --  140  K 3.3* 3.4*  --  2.9*  CL 109 113*  --  113*  CO2 9*  --   --  13*  GLUCOSE 124* 124*  --  101*  BUN 22 22  --  12  CREATININE 1.15* 0.70 0.84 0.59  CALCIUM 9.3  --   --  8.9  MG  --   --   --  2.2  PHOS  --   --   --  1.2*   GFR: Estimated Creatinine Clearance: 65.3 mL/min (by C-G formula based on SCr of 0.59 mg/dL). Liver Function Tests: Recent Labs  Lab 01/08/20 1449 01/09/20 0859  AST 58* 37  ALT 41 31  ALKPHOS 109 88  BILITOT 1.8* 1.3*  PROT 7.9 6.7  ALBUMIN 4.3 3.3*   Recent Labs  Lab 01/08/20 1449 01/09/20 0859  LIPASE 1,210* 213*   No results for input(s): AMMONIA in the last 168 hours. Coagulation Profile: Recent Labs  Lab 01/08/20 1449    INR 1.1   Cardiac Enzymes: No results for input(s): CKTOTAL, CKMB, CKMBINDEX, TROPONINI in the last 168 hours. BNP (last 3 results) No results for input(s): PROBNP in the last 8760 hours. HbA1C: Recent Labs    01/08/20 1907  HGBA1C 5.0   CBG: Recent Labs  Lab 01/09/20 1729 01/09/20 2003 01/10/20 0001 01/10/20 0428 01/10/20 0746  GLUCAP 128* 121* 89 112* 97   Lipid Profile: No results for input(s): CHOL, HDL, LDLCALC, TRIG, CHOLHDL, LDLDIRECT in the last 72 hours. Thyroid Function Tests: No results for input(s): TSH, T4TOTAL, FREET4, T3FREE, THYROIDAB in the last 72 hours. Anemia Panel: No results for input(s): VITAMINB12, FOLATE, FERRITIN, TIBC, IRON, RETICCTPCT in the last 72 hours. Sepsis Labs: Recent Labs  Lab 01/08/20 1453 01/08/20 1726 01/08/20 1907 01/08/20 2341 01/09/20 0859  PROCALCITON  --   --  <0.10  --  <0.10  LATICACIDVEN 0.8 0.6 0.6 0.6  --     Recent Results (from the past 240 hour(s))  Blood culture (routine single)     Status: None (Preliminary result)   Collection Time: 01/08/20  2:49 PM   Specimen: BLOOD LEFT FOREARM  Result Value Ref Range Status   Specimen Description   Final    BLOOD LEFT FOREARM Performed at [facility] [address] [place], [place] [address]    Special Requests   Final    BOTTLES DRAWN AEROBIC AND ANAEROBIC Blood Culture adequate volume Performed at [facility] [address] [place], [place] [address]    Culture   Final    NO GROWTH 2 DAYS Performed at [facility] [address] [place],  [address]    Report Status PENDING  Incomplete  Respiratory Panel by RT PCR (Flu A&B, Covid) - Nasopharyngeal Swab     Status: None   Collection Time: 01/08/20  2:53 PM   Specimen: Nasopharyngeal Swab  Result Value Ref Range Status   SARS Coronavirus 2 by RT PCR NEGATIVE NEGATIVE Final    Comment: (NOTE) SARS-CoV-2 target nucleic acids are NOT DETECTED.  The  SARS-CoV-2 RNA is generally detectable in upper respiratoy specimens during the acute phase of infection. The lowest concentration of SARS-CoV-2 viral copies this assay can detect is 131 copies/mL. A negative result does not preclude SARS-Cov-2 infection and should not be used as the sole basis for treatment or other patient management decisions. A negative  result may occur with  improper specimen collection/handling, submission of specimen other than nasopharyngeal swab, presence of viral mutation(s) within the areas targeted by this assay, and inadequate number of viral copies (<131 copies/mL). A negative result must be combined with clinical observations, patient history, and epidemiological information. The expected result is Negative.  Fact Sheet for Patients:  PinkCheek.be  Fact Sheet for Healthcare Providers:  GravelBags.it  This test is no t yet approved or cleared by the [place] FDA and  has been authorized for detection and/or diagnosis of SARS-CoV-2 by FDA under an Emergency Use Authorization (EUA). This EUA will remain  in effect (meaning this test can be used) for the duration of the COVID-19 declaration under Section 564(b)(1) of the Act, 21 U.S.C. section 360bbb-3(b)(1), unless the authorization is terminated or revoked sooner.     Influenza A by PCR NEGATIVE NEGATIVE Final   Influenza B by PCR NEGATIVE NEGATIVE Final    Comment: (NOTE) The Xpert Xpress SARS-CoV-2/FLU/RSV assay is intended as an aid in  the diagnosis of influenza from Nasopharyngeal swab specimens and  should not be used as a sole basis for treatment. Nasal washings and  aspirates are unacceptable for Xpert Xpress SARS-CoV-2/FLU/RSV  testing.  Fact Sheet for Patients: PinkCheek.be  Fact Sheet for Healthcare Providers: GravelBags.it  This test is not yet approved or cleared  by the [place] FDA and  has been authorized for detection and/or diagnosis of SARS-CoV-2 by  FDA under an Emergency Use Authorization (EUA). This EUA will remain  in effect (meaning this test can be used) for the duration of the  Covid-19 declaration under Section 564(b)(1) of the Act, 21  U.S.C. section 360bbb-3(b)(1), unless the authorization is  terminated or revoked. Performed at [facility], [place] [address] [place], [place] [address]   Culture, blood (single)     Status: None (Preliminary result)   Collection Time: 01/08/20  3:41 PM   Specimen: BLOOD  Result Value Ref Range Status   Specimen Description   Final    BLOOD RIGHT ANTECUBITAL Performed at [facility] [address], [place], [place] [address]    Special Requests   Final    BOTTLES DRAWN AEROBIC AND ANAEROBIC Blood Culture results may not be optimal due to an excessive volume of blood received in culture bottles Performed at [facility] [address], [place], [place] [address]    Culture   Final    NO GROWTH 2 DAYS Performed at [facility] [address], [place], [place] [address]    Report Status PENDING  Incomplete  Urine culture     Status: None   Collection Time: 01/08/20  6:02 PM   Specimen: In/Out Cath Urine  Result Value Ref Range Status   Specimen Description   Final    IN/OUT CATH URINE Performed at [facility] [address] [place], [place] [address]    Special Requests   Final    NONE Performed at [facility], [place] [address], [place], [place] [address]    Culture   Final    NO GROWTH Performed at [facility], [place] [address], [place], [place] [address]    Report Status 01/09/2020 FINAL  Final         Radiology Studies: CT Head Wo Contrast  Result Date: 01/08/2020 CLINICAL DATA:  Mental status change extreme lethargy and weakness EXAM: CT HEAD WITHOUT CONTRAST  TECHNIQUE: Contiguous  axial images were obtained from the base of the skull through the vertex without intravenous contrast. COMPARISON:  None. FINDINGS: Brain: No acute territorial infarction, hemorrhage or intracranial mass. Minimal white matter hypodensity on the right. Nonenlarged ventricles. Vascular: No hyperdense vessels.  No unexpected calcification. Skull: Normal. Negative for fracture or focal lesion. Sinuses/Orbits: No acute finding. Other: None IMPRESSION: 1. No CT evidence for acute intracranial abnormality. 2. Minimal white matter hypodensity on the right, may reflect small vessel ischemic change. Electronically Signed   By: Donavan Foil M.D.   On: 01/08/2020 16:35   DG Chest Port 1 View  Result Date: 01/08/2020 CLINICAL DATA:  uncle called EMS out today complaining of extreme lethargy and weakness for 6 days. EXAM: PORTABLE CHEST 1 VIEW.  AP portable semi erect.  Patient is rotated COMPARISON:  None. FINDINGS: The heart size and mediastinal contours are within normal limits. No focal consolidation. No pulmonary edema. No pleural effusion. No pneumothorax. No acute osseous abnormality. IMPRESSION: No active disease. Electronically Signed   By: Iven Finn M.D.   On: 01/08/2020 15:53   CT Angio Chest/Abd/Pel for Dissection W and/or W/WO  Result Date: 01/08/2020 CLINICAL DATA:  Chest and abdominal pain. Evaluate for thoracic/abdominal dissection. EXAM: CT ANGIOGRAPHY CHEST, ABDOMEN AND PELVIS TECHNIQUE: Non-contrast CT of the chest was initially obtained. Multidetector CT imaging through the chest, abdomen and pelvis was performed using the standard protocol during bolus administration of intravenous contrast. Multiplanar reconstructed images and MIPs were obtained and reviewed to evaluate the vascular anatomy. CONTRAST:  122mL OMNIPAQUE IOHEXOL 350 MG/ML SOLN COMPARISON:  CT abdomen and pelvis - 05/02/2019 FINDINGS: CTA CHEST FINDINGS Vascular Findings: Evaluation of the aortic root and  proximal aspect of the ascending thoracic aorta is degraded secondary to pulsation artifact. Given this limitation, there is no evidence of thoracic aortic aneurysm or dissection with measurements as follows. Review of the precontrast images is negative for the presence of an intramural hematoma. Atherosclerotic plaque involving the aortic arch and distal aspect of the descending thoracic aorta, not resulting in hemodynamically significant stenosis. Bovine configuration of the aortic arch. The branch vessels of the aortic arch appear widely patent throughout their imaged courses. Normal heart size.  No pericardial effusion. Although this examination was not tailored for the evaluation the pulmonary arteries, there are no discrete filling defects within the central pulmonary arterial tree to suggest central pulmonary embolism. Normal caliber of the main pulmonary artery. ------------------------------------------------------------- Thoracic aortic measurements: Sinotubular junction 26 mm as measured in greatest oblique short axis coronal dimension. Proximal ascending aorta 38 mm in greatest oblique short axis axial diameter (axial image 29, series 6); 36 mm in greatest oblique short axis coronal diameter (coronal image 92, series 4). Aortic arch aorta 32 mm as measured in greatest oblique short axis sagittal dimension. Proximal descending thoracic aorta 28 mm as measured in greatest oblique short axis axial dimension at the level of the main pulmonary artery. Distal descending thoracic aorta 23 mm as measured in greatest oblique short axis axial dimension at the level of the diaphragmatic hiatus. Review of the MIP images confirms the above findings. ------------------------------------------------------------- Non-Vascular Findings: Mediastinum/Lymph Nodes: No bulky mediastinal, hilar or axillary lymphadenopathy. Lungs/Pleura: Minimal subsegmental atelectasis within the left lower lobe. No discrete focal airspace  opacities. No pleural effusion or pneumothorax. The central pulmonary airways appear widely patent. No discrete pulmonary nodules. Musculoskeletal: No acute or aggressive osseous abnormalities. Regional soft tissues appear normal. The thyroid appears somewhat atrophic but without discrete nodule. _________________________________________________________ _________________________________________________________  CTA ABDOMEN AND PELVIS FINDINGS VASCULAR Aorta: Moderate amount of eccentric predominantly calcified atherosclerotic plaque within a normal caliber abdominal aorta, not resulting in a hemodynamically significant stenosis. No evidence of abdominal aortic dissection or periaortic stranding. Celiac: Widely patent without hemodynamically significant narrowing. Conventional branching pattern. 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 SMA: Widely patent without a hemodynamically significant narrowing. Conventional branching pattern. The distal tributaries of the SMA are widely patent without discrete lumen filling defect to suggest distal embolism. Renals: Note is made of a tiny accessory left renal artery which supplies the inferior pole the left kidney. Solitary right renal artery. There is a moderate amount of eccentric predominantly calcified atherosclerotic plaque involving the origin and proximal aspects of the bilateral renal arteries, not resulting in a hemodynamically significant stenosis. No vessel irregularity to suggest FMD. IMA: Remains patent. Inflow: Minimal amount of eccentric mixed calcified and noncalcified atherosclerotic plaque involves the bilateral normal caliber common iliac arteries, not resulting in hemodynamically significant  stenosis. The bilateral internal iliac arteries are mildly disease though patent and of normal caliber. The bilateral external iliac arteries are tortuous but widely patent without hemodynamically significant narrowing. Veins: The IVC and pelvic venous systems appear patent on this arterial phase examination. Review of the MIP images confirms the above findings. _________________________________________________________ Evaluation of abdominal organs is limited to the arterial phase of enhancement. NON-VASCULAR Hepatobiliary: Normal hepatic contour. There is diffuse decreased attenuation hepatic parenchyma suggestive of hepatic steatosis. No discrete hyperenhancing hepatic lesions. Post cholecystectomy. No intra or extrahepatic biliary ductal dilatation. No ascites. Pancreas: There is rather extensive inflammatory change about the pancreatic bed without definable/drainable fluid collection/pseudocyst. There is apparent homogeneous enhancement of the pancreatic parenchyma without definitive evidence of pancreatic necrosis. Suspected minimal amount of vasospasm involving the distal aspect of the splenic artery however both the splenic artery and vein remain patent. Spleen: Normal appearance of the spleen. Note is made of a splenule about the anterior inferior aspect of the spleen. Adrenals/Urinary Tract: Homogeneous enhancement of the bilateral kidneys. Note is made of an approximately 6.8 cm hypoattenuating partially exophytic cyst arising from the inferior pole of the left kidney. No discrete right-sided renal lesions. No definite evidence of nephrolithiasis on this postcontrast examination. No urinary obstruction. Normal appearance of the bilateral adrenal glands. Normal appearance of the urinary bladder given degree of distention. Stomach/Bowel: Moderate colonic stool burden without evidence of enteric obstruction. No discrete areas of bowel wall thickening. Normal appearance of the terminal ileum and appendix. No  pneumoperitoneum, pneumatosis or portal venous gas. Lymphatic: No bulky retroperitoneal, mesenteric, pelvic or inguinal lymphadenopathy. Reproductive: Normal appearance of the pelvic organs for age. No discrete adnexal lesion. No free fluid the pelvic cul-de-sac. Other: Minimal amount of subcutaneous edema about the midline of the low back. Musculoskeletal: No acute or aggressive osseous abnormalities. Moderate to severe multilevel lumbar spine DDD, worse at L3-L4 and L5-S1 with disc space height loss, endplate irregularity and sclerosis. Small posteriorly directed disc osteophyte complex is also seen at T11-T12. Review of the MIP images confirms the above findings. IMPRESSION: Chest CTA impression: 1. No acute cardiopulmonary disease. Specifically, no evidence of thoracic aortic aneurysm or dissection on this motion degraded examination. Abdomen and pelvic CTA impression: 1. Findings suggestive of acute uncomplicated pancreatitis  with moderate to large amount of peripancreatic stranding, but without definable/drainable fluid collection/pseudocyst and no definitive evidence of pancreatic necrosis. 2. Post cholecystectomy. 3. Scattered atherosclerotic plaque within a normal caliber abdominal aorta. Aortic Atherosclerosis (ICD10-I70.0). 4. 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 Electronically Signed   By: Sandi Mariscal M.D.   On: 01/08/2020 17:11        Scheduled Meds: . benztropine  1 mg Oral Daily  . hydrALAZINE  7 mg Intravenous Q6H  . insulin aspart  0-9 Units Subcutaneous Q4H  . metoprolol tartrate  5 mg Intravenous Q8H  . nicotine  14 mg Transdermal Daily  . sertraline  100 mg Oral Daily  . ziprasidone  80 mg Oral QHS   Continuous Infusions: . lactated ringers 100 mL/hr at 01/10/20 0344     LOS: 2 days    Time spent:40  min    Ranson Belluomini, Geraldo Docker, MD [facility] Pager [phone]  If 7PM-7AM, please contact night-coverage [email or web address] Password [email or web address] 01/10/2020, 8:59 AM

## 2020-01-10 NOTE — Progress Notes (Signed)
CRITICAL VALUE ALERT  Critical Value:  Potassium 2.4  Date & Time Notied:  01/10/20  1728  Provider Notified: C.Sherral Hammers MD 01/10/20 1729  Orders Received/Actions taken:  Dr Sherral Hammers called at 1735 to check on running potassium IVPB. Placed orders at 1755 for po and IV potassium.

## 2020-01-11 DIAGNOSIS — F320 Major depressive disorder, single episode, mild: Secondary | ICD-10-CM | POA: Diagnosis not present

## 2020-01-11 DIAGNOSIS — F17210 Nicotine dependence, cigarettes, uncomplicated: Secondary | ICD-10-CM | POA: Diagnosis not present

## 2020-01-11 DIAGNOSIS — E872 Acidosis: Secondary | ICD-10-CM | POA: Diagnosis not present

## 2020-01-11 DIAGNOSIS — K8500 Idiopathic acute pancreatitis without necrosis or infection: Secondary | ICD-10-CM | POA: Diagnosis not present

## 2020-01-11 LAB — GLUCOSE, CAPILLARY
Glucose-Capillary: 85 mg/dL (ref 70–99)
Glucose-Capillary: 85 mg/dL (ref 70–99)
Glucose-Capillary: 88 mg/dL (ref 70–99)
Glucose-Capillary: 110 mg/dL — ABNORMAL HIGH (ref 70–99)
Glucose-Capillary: 101 mg/dL — ABNORMAL HIGH (ref 70–99)

## 2020-01-11 LAB — MAGNESIUM: Magnesium: 2 mg/dL (ref 1.7–2.4)

## 2020-01-11 LAB — CBC WITH DIFFERENTIAL/PLATELET
Abs Immature Granulocytes: 0.08 10*3/uL — ABNORMAL HIGH (ref 0.00–0.07)
Basophils Absolute: 0 10*3/uL (ref 0.0–0.1)
Basophils Relative: 0 %
Eosinophils Absolute: 0.1 10*3/uL (ref 0.0–0.5)
Eosinophils Relative: 1 %
HCT: 38.1 % (ref 36.0–46.0)
Hemoglobin: 13.4 g/dL (ref 12.0–15.0)
Immature Granulocytes: 1 %
Lymphocytes Relative: 10 %
Lymphs Abs: 0.9 10*3/uL (ref 0.7–4.0)
MCH: 31 pg (ref 26.0–34.0)
MCHC: 35.2 g/dL (ref 30.0–36.0)
MCV: 88.2 fL (ref 80.0–100.0)
Monocytes Absolute: 0.6 10*3/uL (ref 0.1–1.0)
Monocytes Relative: 6 %
Neutro Abs: 8.1 10*3/uL — ABNORMAL HIGH (ref 1.7–7.7)
Neutrophils Relative %: 82 %
Platelets: 127 10*3/uL — ABNORMAL LOW (ref 150–400)
RBC: 4.32 MIL/uL (ref 3.87–5.11)
RDW: 17.1 % — ABNORMAL HIGH (ref 11.5–15.5)
WBC: 9.8 10*3/uL (ref 4.0–10.5)
nRBC: 0 % (ref 0.0–0.2)

## 2020-01-11 LAB — COMPREHENSIVE METABOLIC PANEL
ALT: 22 U/L (ref 0–44)
AST: 24 U/L (ref 15–41)
Albumin: 3.1 g/dL — ABNORMAL LOW (ref 3.5–5.0)
Alkaline Phosphatase: 74 U/L (ref 38–126)
Anion gap: 12 (ref 5–15)
BUN: 9 mg/dL (ref 8–23)
CO2: 24 mmol/L (ref 22–32)
Calcium: 9.3 mg/dL (ref 8.9–10.3)
Chloride: 104 mmol/L (ref 98–111)
Creatinine, Ser: 0.44 mg/dL (ref 0.44–1.00)
GFR calc Af Amer: >60 mL/min (ref >60)
GFR calc non Af Amer: >60 mL/min (ref >60)
Glucose, Bld: 77 mg/dL (ref 70–99)
Potassium: 3.2 mmol/L — ABNORMAL LOW (ref 3.5–5.1)
Sodium: 140 mmol/L (ref 135–145)
Total Bilirubin: 1 mg/dL (ref 0.3–1.2)
Total Protein: 6.2 g/dL — ABNORMAL LOW (ref 6.5–8.1)

## 2020-01-11 LAB — LACTATE DEHYDROGENASE: LDH: 182 U/L (ref 98–192)

## 2020-01-11 LAB — LIPASE, BLOOD: Lipase: 31 U/L (ref 11–51)

## 2020-01-11 LAB — PHOSPHORUS: Phosphorus: 1.1 mg/dL — ABNORMAL LOW (ref 2.5–4.6)

## 2020-01-11 MED ORDER — POTASSIUM PHOSPHATES 15 MMOLE/5ML IV SOLN
40.0000 mmol | Freq: Once | INTRAVENOUS | Status: AC
  Administered 2020-01-11: 40 mmol via INTRAVENOUS
  Filled 2020-01-11: qty 13.33

## 2020-01-11 NOTE — Progress Notes (Signed)
PROGRESS NOTE    [name]  AST:[id number] DOB: May 27, 1955 DOA: 01/08/2020 PCP: Bartholome Bill, MD     Brief Narrative:  [name] is a 64 y.o. WF PMHx depression, schizoaffective DO, myocarditis, irritable bowel syndrome, prolapsed internal hemorrhoids grade 3, tubular adenoma of colon, diverticulosis.  presents to the Emergency Department complaining of abdominal pain. Level V caveat due to altered mental status. History is provided by the patient and EMS. She presents by EMS from home for evaluation of six days of generalized and lower abdominal pain with associated constipation and vomiting. She has associated cough, shortness of breath and generalized weakness. No fevers. She denies any dysuria. She does report urinating less. EMS activated a code sepsis prior to ED arrival due to tachycardia, tachypnea andend tidalcapnography of15.She denies tobacco, alcohol, drug use. She is unsure of her home meds. Takes occasional    Subjective: 10/3 afebrile overnight A/O x4, patient states feels much better however this morning did have some abdominal pain which required pain medication.    Assessment & Plan: Covid vaccination;   Active Problems:   Schizoaffective disorder, depressive type (HCC)   Cigarette nicotine dependence without complication   Prolapsed internal hemorrhoids, grade 3   Acute pancreatitis   Depression   Diverticulosis   Tubular adenoma of colon   Sepsis, unspecified organism ([address])   Sepsis unspecified organism -On admission patient meets criteria for sepsis RR> 22, WBC> 12, area of infection pancreas. -Lactic acid Results for MARCI, POLITO (MRN [id number]) as of 01/09/2020 15:54  Ref. Range 01/08/2020 14:53 01/08/2020 17:26 01/08/2020 19:07 01/08/2020 23:41  Lactic Acid, Venous Latest Ref Range: 0.5 - 1.9 mmol/L 0.8 0.6 0.6 0.6  -Procalcitonin Results for YASAMAN, KOLEK (MRN [id number]) as of 01/09/2020 15:54  Ref. Range 01/08/2020 19:07  01/09/2020 08:59  Procalcitonin Latest Units: ng/mL <0.10 <0.10  -Sepsis ruled out, lactic acid and procalcitonin negative will discontinue antibiotic.  Acute pancreatitis Lab Results  Component Value Date   LIPASE 31 01/11/2020   LIPASE 75 (H) 01/10/2020   LIPASE 213 (H) 01/09/2020   LIPASE 1,210 (H) 01/08/2020   LIPASE 17 09/14/2008  -10/1 DC normal saline 177ml/hr -10/1 lactated Ringer 140ml/hr -Strict in and out +5.5 L -Daily weight Filed Weights   01/08/20 2349 01/10/20 0500 01/11/20 0500  Weight: 73.5 kg 70.4 kg 71.8 kg  -Titrate O2 to maintain SPO2> 95% -Calculate Ranson's criteria for mortality in the a.m.= 2 -Zofran PRN -9/30 EtOH and urine toxicology negative -10/2 ABG; near normal see results -10/3 will wait 1 more day before starting clear liquids since patient did have some pain this morning requiring pain medication.  Metabolic acidosis -1 amp sodium bicarb  Essential HTN -Metoprolol IV 5 mg TID -Hydralazine IV 7 mg QID -Hydralazine IV 5 mg QID hold for MAP<65  Hypokalemia -Potassium goal> 4 -10/3 K-Phos IV 40 mmol   Hypophosphatemia -Phosphorus goal> 2.5 -See hypokalemia  Hyperglycemia -Sensitive SSI  Schizoaffective disorder/depression -Benztropine 1 mg daily -Zoloft 100 mg daily -Geodon 80 mg qhs  Tobacco abuse -Nicotine patch   DVT prophylaxis: Lovenox Code Status: Full Family Communication: 9/30 spoke with [facility]) explained plan of care answered all questions Status is: Inpatient    Dispo: The patient is from:               Anticipated d/c is to: Home              Anticipated d/c date is: Home  Patient currently unstable      Consultants:    Procedures/Significant Events:  9/30 CXR; no active disease 9/30 chest/abdomen/pelvis;- acute uncomplicated pancreatitis with moderate to large amount of peripancreatic stranding, but without definable/drainable fluid collection/pseudocyst and no  definitive evidence of pancreatic necrosis. -. Post cholecystectomy. -. 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 9/30 CT Head WO Contrast; No CT evidence for acute intracranial abnormality  I have personally reviewed and interpreted all radiology studies and my findings are as above.  VENTILATOR SETTINGS: Room air 10/3 SpO2 96%   Cultures  9/30 respiratory virus panel SARS coronavirus negative 9/30 influenza A/B negative 9/30 blood LEFT forearm NGTD 9/30 blood RIGHT AC NGTD 9/30 urine negative    Antimicrobials: Anti-infectives (From admission, onward)   Start     Ordered Stop   01/08/20 1600  meropenem (MERREM) 1 g in sodium chloride 0.9 % 100 mL IVPB  Status:  Discontinued        01/08/20 1540 01/09/20 1338       Devices    LINES / TUBES:      Continuous Infusions: . lactated ringers 100 mL/hr at 01/11/20 0200     Objective: Vitals:   01/10/20 1315 01/10/20 2144 01/11/20 0500 01/11/20 0524  BP: (!) 160/78 139/66  129/66  Pulse: 70 (!) 58  70  Resp: 12   18  Temp: 98.1 F (36.7 C) 98.2 F (36.8 C)  98.1 F (36.7 C)  TempSrc: Oral Oral  Oral  SpO2:  97%  96%  Weight:   71.8 kg   Height:        Intake/Output Summary (Last 24 hours) at 01/11/2020 1116 Last data filed at 01/11/2020 0902 Gross per 24 hour  Intake 2850.53 ml  Output 1200 ml  Net 1650.53 ml   Filed Weights   01/08/20 2349 01/10/20 0500 01/11/20 0500  Weight: 73.5 kg 70.4 kg 71.8 kg   Physical Exam:  General: A/O x4, No acute respiratory distress Eyes: negative scleral hemorrhage, negative anisocoria, negative icterus ENT: Negative Runny nose, negative gingival bleeding, Neck:  Negative scars, masses, torticollis, lymphadenopathy, JVD Lungs: Clear to auscultation bilaterally without wheezes or crackles Cardiovascular: Regular  rate and rhythm without murmur gallop or rub normal S1 and S2 Abdomen: negative abdominal pain, nondistended, positive soft, bowel sounds, no rebound, no ascites, no appreciable mass Extremities: No significant cyanosis, clubbing, or edema bilateral lower extremities Skin: Negative rashes, lesions, ulcers Psychiatric:  Negative depression, negative anxiety, negative fatigue, negative mania  Central nervous system:  Cranial nerves II through XII intact, tongue/uvula midline, all extremities muscle strength 5/5, sensation intact throughout, negative dysarthria, negative expressive aphasia, negative receptive aphasia.  .     Data Reviewed: Care during the described time interval was provided by me .  I have reviewed this patient's available data, including medical history, events of note, physical examination, and all test results as part of my evaluation.  CBC: Recent Labs  Lab 01/08/20 1449 01/08/20 1449 01/08/20 1511 01/08/20 1907 01/09/20 0859 01/10/20 0644 01/11/20 0644  WBC 15.5*  --   --  13.6* 16.3* 12.9* 9.8  NEUTROABS 14.1*  --   --   --  15.1* 11.5* 8.1*  HGB 16.7*   < > 17.7* 15.4* 14.5 12.8 13.4  HCT 49.9*   < > 52.0* 45.3 41.8 36.8 38.1  MCV 90.6  --   --  89.5 88.4 87.4 88.2  PLT 326  --   --  254 240 168 127*   < > = values in this interval not displayed.   Basic Metabolic Panel: Recent Labs  Lab 01/08/20 1449 01/08/20 1449 01/08/20 1511 01/08/20 1907 01/09/20 0859 01/10/20 0644 01/10/20 1611 01/11/20 0644  NA 136  --  139  --  140 140  --  140  K 3.3*   < > 3.4*  --  2.9* 2.1* 2.4* 3.2*  CL 109  --  113*  --  113* 104  --  104  CO2 9*  --   --   --  13* 21*  --  24  GLUCOSE 124*  --  124*  --  101* 97  --  77  BUN 22  --  22  --  12 7*  --  9  CREATININE 1.15*   < > 0.70 0.84 0.59 0.45  --  0.44  CALCIUM 9.3  --   --   --  8.9 9.0  --  9.3  MG  --   --   --   --  2.2 2.0 1.9 2.0  PHOS  --   --   --   --  1.2* 1.2* 2.5 1.1*   < > = values in this  interval not displayed.   GFR: Estimated Creatinine Clearance: 65.9 mL/min (by C-G formula based on SCr of 0.44 mg/dL). Liver Function Tests: Recent Labs  Lab 01/08/20 1449 01/09/20 0859 01/10/20 0644 01/11/20 0644  AST 58* 37 26 24  ALT 41 31 26 22   ALKPHOS 109 88 78 74  BILITOT 1.8* 1.3* 1.5* 1.0  PROT 7.9 6.7 6.2* 6.2*  ALBUMIN 4.3 3.3* 3.1* 3.1*   Recent Labs  Lab 01/08/20 1449 01/09/20 0859 01/10/20 0644 01/11/20 0644  LIPASE 1,210* 213* 75* 31   No results for input(s): AMMONIA in the last 168 hours. Coagulation Profile: Recent Labs  Lab 01/08/20 1449  INR 1.1   Cardiac Enzymes: No results for input(s): CKTOTAL, CKMB, CKMBINDEX, TROPONINI in the last 168 hours. BNP (last 3 results) No results for input(s): PROBNP in the last 8760 hours. HbA1C: Recent Labs    01/08/20 1907  HGBA1C 5.0   CBG: Recent Labs  Lab 01/10/20 1603 01/10/20 1940 01/10/20 2347 01/11/20 0401 01/11/20 0731  GLUCAP 110* 94 82 85 85   Lipid Profile: No results for input(s): CHOL, HDL, LDLCALC, TRIG, CHOLHDL, LDLDIRECT in the last 72 hours. Thyroid Function Tests: No results for input(s): TSH, T4TOTAL, FREET4, T3FREE, THYROIDAB in the last 72 hours. Anemia Panel: No results for input(s): VITAMINB12, FOLATE, FERRITIN, TIBC, IRON, RETICCTPCT in the last 72 hours. Sepsis Labs: Recent Labs  Lab 01/08/20 1453 01/08/20 1726 01/08/20 1907 01/08/20 2341 01/09/20 0859 01/10/20 0644  PROCALCITON  --   --  <0.10  --  <0.10 <0.10  LATICACIDVEN 0.8 0.6 0.6 0.6  --   --     Recent Results (from the past 240 hour(s))  Blood culture (routine single)     Status: None (Preliminary result)   Collection Time: 01/08/20  2:49 PM   Specimen: BLOOD LEFT FOREARM  Result Value Ref Range Status   Specimen Description   Final    BLOOD LEFT FOREARM Performed at [facility] [address] [place], [place] [address]    Special Requests   Final    BOTTLES DRAWN AEROBIC AND  ANAEROBIC Blood Culture adequate volume Performed at [facility], [place] [address], [place], [place] [address]    Culture   Final    NO GROWTH 2 DAYS Performed at [facility] [address] [place], [place] [address]    Report Status PENDING  Incomplete  Respiratory Panel by RT PCR (Flu A&B, Covid) - Nasopharyngeal Swab     Status: None   Collection Time: 01/08/20  2:53 PM   Specimen: Nasopharyngeal Swab  Result Value Ref Range Status   SARS Coronavirus 2 by RT PCR NEGATIVE NEGATIVE Final    Comment: (NOTE) SARS-CoV-2 target nucleic acids are NOT DETECTED.  The SARS-CoV-2 RNA is generally detectable in upper respiratoy specimens during the acute phase of infection. The lowest concentration of SARS-CoV-2 viral copies this assay can detect is 131 copies/mL. A negative result does not preclude SARS-Cov-2 infection and should not be used as the sole basis for treatment or other patient management decisions. A negative result may occur with  improper specimen collection/handling, submission of specimen other than nasopharyngeal swab, presence of viral mutation(s) within the areas targeted by this assay, and inadequate number of viral copies (<131 copies/mL). A negative result must be combined with clinical observations, patient history, and epidemiological information. The expected result is Negative.  Fact Sheet for Patients:  PinkCheek.be  Fact Sheet for Healthcare Providers:  GravelBags.it  This test is no t yet approved or cleared by the [place] FDA and  has been authorized for detection and/or diagnosis of SARS-CoV-2 by FDA under an Emergency Use Authorization (EUA). This EUA will remain  in effect (meaning this test can be used) for the duration of the COVID-19 declaration under Section 564(b)(1) of the Act, 21 U.S.C. section 360bbb-3(b)(1), unless the authorization is terminated  or revoked sooner.     Influenza A by PCR NEGATIVE NEGATIVE Final   Influenza B by PCR NEGATIVE NEGATIVE Final    Comment: (NOTE) The Xpert Xpress SARS-CoV-2/FLU/RSV assay is intended as an aid in  the diagnosis of influenza from Nasopharyngeal swab specimens and  should not be used as a sole basis for treatment. Nasal washings and  aspirates are unacceptable for Xpert Xpress SARS-CoV-2/FLU/RSV  testing.  Fact Sheet for Patients: PinkCheek.be  Fact Sheet for Healthcare Providers: GravelBags.it  This test is not yet approved or cleared by the [place] FDA and  has been authorized for detection and/or diagnosis of SARS-CoV-2 by  FDA under an Emergency Use Authorization (EUA). This EUA will remain  in effect (meaning this test can be used) for the duration of the  Covid-19 declaration under Section 564(b)(1) of the Act, 21  U.S.C. section 360bbb-3(b)(1), unless the authorization is  terminated or revoked. Performed at [facility] , [place] [address], [place], [place] [address]   Culture, blood (single)     Status: None (Preliminary result)   Collection Time: 01/08/20  3:41 PM   Specimen: BLOOD  Result Value Ref Range Status   Specimen Description   Final    BLOOD RIGHT ANTECUBITAL Performed at [facility] [address], [place], [place] [address]    Special Requests   Final    BOTTLES DRAWN AEROBIC AND ANAEROBIC Blood Culture results may not be optimal due to an excessive volume of blood received in culture bottles Performed at [facility] [address], [place], [place] [address]    Culture   Final    NO GROWTH 2 DAYS Performed at [facility] [place],  [place] [address]    Report Status PENDING  Incomplete  Urine culture     Status: None   Collection Time: 01/08/20  6:02 PM   Specimen: In/Out Cath Urine  Result Value Ref Range  Status   Specimen Description   Final    IN/OUT CATH URINE Performed at [facility] [address], [place], [place] [address]    Special Requests   Final    NONE Performed at [facility], [place] [address], [place], [place] [address]    Culture   Final    NO GROWTH Performed at [facility], [place] [address] [place], [place] [address]    Report Status 01/09/2020 FINAL  Final         Radiology Studies: No results found.      Scheduled Meds: . benztropine  1 mg Oral Daily  . hydrALAZINE  7 mg Intravenous Q6H  . insulin aspart  0-9 Units Subcutaneous Q4H  . metoprolol tartrate  5 mg Intravenous Q8H  . nicotine  14 mg Transdermal Daily  . sertraline  100 mg Oral Daily  . ziprasidone  80 mg Oral QHS   Continuous Infusions: . lactated ringers 100 mL/hr at 01/11/20 0200     LOS: 3 days    Time spent:40 min    Jamarri Vuncannon, Geraldo Docker, MD [facility] Pager [phone]  If 7PM-7AM, please contact night-coverage [email or web address] Password [email or web address] 01/11/2020, 11:16 AM

## 2020-01-12 ENCOUNTER — Inpatient Hospital Stay (HOSPITAL_COMMUNITY): Payer: Medicare (Managed Care)

## 2020-01-12 ENCOUNTER — Inpatient Hospital Stay: Payer: Self-pay

## 2020-01-12 DIAGNOSIS — E876 Hypokalemia: Secondary | ICD-10-CM

## 2020-01-12 DIAGNOSIS — K8500 Idiopathic acute pancreatitis without necrosis or infection: Secondary | ICD-10-CM | POA: Diagnosis not present

## 2020-01-12 DIAGNOSIS — I10 Essential (primary) hypertension: Secondary | ICD-10-CM

## 2020-01-12 LAB — COMPREHENSIVE METABOLIC PANEL
ALT: 22 U/L (ref 0–44)
AST: 22 U/L (ref 15–41)
Albumin: 3.4 g/dL — ABNORMAL LOW (ref 3.5–5.0)
Alkaline Phosphatase: 73 U/L (ref 38–126)
Anion gap: 14 (ref 5–15)
BUN: 6 mg/dL — ABNORMAL LOW (ref 8–23)
CO2: 24 mmol/L (ref 22–32)
Calcium: 9 mg/dL (ref 8.9–10.3)
Chloride: 101 mmol/L (ref 98–111)
Creatinine, Ser: 0.4 mg/dL — ABNORMAL LOW (ref 0.44–1.00)
GFR calc Af Amer: >60 mL/min (ref >60)
GFR calc non Af Amer: >60 mL/min (ref >60)
Glucose, Bld: 90 mg/dL (ref 70–99)
Potassium: 3.3 mmol/L — ABNORMAL LOW (ref 3.5–5.1)
Sodium: 139 mmol/L (ref 135–145)
Total Bilirubin: 1.3 mg/dL — ABNORMAL HIGH (ref 0.3–1.2)
Total Protein: 6.5 g/dL (ref 6.5–8.1)

## 2020-01-12 LAB — GLUCOSE, CAPILLARY
Glucose-Capillary: 101 mg/dL — ABNORMAL HIGH (ref 70–99)
Glucose-Capillary: 139 mg/dL — ABNORMAL HIGH (ref 70–99)
Glucose-Capillary: 79 mg/dL (ref 70–99)
Glucose-Capillary: 80 mg/dL (ref 70–99)
Glucose-Capillary: 83 mg/dL (ref 70–99)
Glucose-Capillary: 89 mg/dL (ref 70–99)
Glucose-Capillary: 92 mg/dL (ref 70–99)

## 2020-01-12 LAB — LIPASE, BLOOD: Lipase: 37 U/L (ref 11–51)

## 2020-01-12 LAB — LACTATE DEHYDROGENASE: LDH: 144 U/L (ref 98–192)

## 2020-01-12 LAB — CBC WITH DIFFERENTIAL/PLATELET
Abs Immature Granulocytes: 0.16 10*3/uL — ABNORMAL HIGH (ref 0.00–0.07)
Basophils Absolute: 0.1 10*3/uL (ref 0.0–0.1)
Basophils Relative: 1 %
Eosinophils Absolute: 0.1 10*3/uL (ref 0.0–0.5)
Eosinophils Relative: 1 %
HCT: 36.4 % (ref 36.0–46.0)
Hemoglobin: 12.3 g/dL (ref 12.0–15.0)
Immature Granulocytes: 2 %
Lymphocytes Relative: 11 %
Lymphs Abs: 1 10*3/uL (ref 0.7–4.0)
MCH: 30.6 pg (ref 26.0–34.0)
MCHC: 33.8 g/dL (ref 30.0–36.0)
MCV: 90.5 fL (ref 80.0–100.0)
Monocytes Absolute: 0.6 10*3/uL (ref 0.1–1.0)
Monocytes Relative: 6 %
Neutro Abs: 7.8 10*3/uL — ABNORMAL HIGH (ref 1.7–7.7)
Neutrophils Relative %: 79 %
Platelets: 165 10*3/uL (ref 150–400)
RBC: 4.02 MIL/uL (ref 3.87–5.11)
RDW: 17.1 % — ABNORMAL HIGH (ref 11.5–15.5)
WBC: 9.6 10*3/uL (ref 4.0–10.5)
nRBC: 0 % (ref 0.0–0.2)

## 2020-01-12 LAB — MAGNESIUM: Magnesium: 2 mg/dL (ref 1.7–2.4)

## 2020-01-12 LAB — PHOSPHORUS: Phosphorus: 1.9 mg/dL — ABNORMAL LOW (ref 2.5–4.6)

## 2020-01-12 MED ORDER — PANTOPRAZOLE SODIUM 40 MG PO TBEC
40.0000 mg | DELAYED_RELEASE_TABLET | Freq: Every day | ORAL | Status: DC
  Administered 2020-01-12: 40 mg via ORAL
  Filled 2020-01-12: qty 1

## 2020-01-12 MED ORDER — PANTOPRAZOLE SODIUM 40 MG IV SOLR
40.0000 mg | INTRAVENOUS | Status: DC
  Administered 2020-01-13 – 2020-01-15 (×3): 40 mg via INTRAVENOUS
  Filled 2020-01-12 (×3): qty 40

## 2020-01-12 MED ORDER — IOHEXOL 300 MG/ML  SOLN
100.0000 mL | Freq: Once | INTRAMUSCULAR | Status: AC | PRN
  Administered 2020-01-12: 100 mL via INTRAVENOUS

## 2020-01-12 MED ORDER — ENOXAPARIN SODIUM 40 MG/0.4ML ~~LOC~~ SOLN
40.0000 mg | SUBCUTANEOUS | Status: DC
  Administered 2020-01-12 – 2020-01-19 (×8): 40 mg via SUBCUTANEOUS
  Filled 2020-01-12 (×9): qty 0.4

## 2020-01-12 NOTE — Assessment & Plan Note (Signed)
-   noted; no evidence of bleeding

## 2020-01-12 NOTE — Assessment & Plan Note (Signed)
-  Replete and recheck as needed 

## 2020-01-12 NOTE — Care Management Important Message (Signed)
Important Message  Patient Details IM Letter given to the Patient Name: [name] MRN: [id number] Date of Birth: 08-25-1955   Medicare Important Message Given:  Yes     Feather, Berrie 01/12/2020, 12:11 PM

## 2020-01-12 NOTE — Hospital Course (Addendum)
[name] is a 64 yo CF with PMH schizoaffective d/c, depression, IBS, prolapsed internal hemorrhoids grade 3, tubular adenoma of colon, diverticulosis who presented with abdominal pain. She underwent work-up and was found to have elevated lipase, 1210 and CT findings consistent with uncomplicated pancreatitis with peripancreatic stranding.  She had also not eaten approximately 6 days prior to admission and was n.p.o. for 4 days during hospitalization.  Due to difficulty tolerating trial of clear liquids on 01/12/2020, she underwent repeat CT abdomen/pelvis.  This was notable for showing improved pancreatitis and decreased peripancreatic edema.  No pancreatic necrosis appreciated.  Due to her prolonged n.p.o. status, attempt was made for NJ tube placement on 01/12/2020 however patient declined further attempts of placement.  On 01/13/2020, she wished to reattempt clear liquid diet trial and attempts to avoid enteral feeds and continue advancing her diet.

## 2020-01-12 NOTE — Assessment & Plan Note (Addendum)
-   sepsis ruled out after admission -Lipase initially 1210 with CT findings consistent with pancreatitis with peripancreatic edema -Now on hospital day 4 for with ongoing nausea with trial of clear liquid diet and worsening pain -CT abdomen/pelvis performed on 01/12/2020 revealing improving pancreatitis with improving peripancreatic edema and no pancreatic necrosis -Patient was without nutrition for approximately 6 days prior to admission which makes now total of about 10 days.  Options include NJ tube placement vs PICC/TPN - patient did not want NJ placed on 10/4 and today she now says her pain is gone and her nausea is minimal and she wants to attempt a CLD this morning - cancelled NJT and restarted CLD; if tolerates would like to advance semi-quickly as I believe there was a psych component to her not wanting to try CLD much yesterday; especially with reassuring labs/lipase/CT abd all of which show resolving pancreatitis - FLD by 10/6 then low residue diet that evening and d/c home on 10/7 (if not sooner)

## 2020-01-12 NOTE — Assessment & Plan Note (Signed)
continue current regimen.

## 2020-01-12 NOTE — Assessment & Plan Note (Signed)
-   see schizoaffective

## 2020-01-12 NOTE — Assessment & Plan Note (Signed)
-   Benztropine 1 mg daily; zoloft 100 mg daily; Geodon 80 mgqhs

## 2020-01-12 NOTE — Plan of Care (Signed)
Pt complaining of continued nausea.  BP elevated and scheduled meds administered per MAR.   Problem: Health Behavior/Discharge Planning: Goal: Ability to manage health-related needs will improve Outcome: Progressing   Problem: Clinical Measurements: Goal: Ability to maintain clinical measurements within normal limits will improve Outcome: Progressing Goal: Diagnostic test results will improve Outcome: Progressing   Problem: Elimination: Goal: Will not experience complications related to bowel motility Outcome: Progressing   Problem: Pain Managment: Goal: General experience of comfort will improve Outcome: Progressing   Problem: Safety: Goal: Ability to remain free from injury will improve Outcome: Progressing   Problem: Clinical Measurements: Goal: Complications related to the disease process, condition or treatment will be avoided or minimized Outcome: Progressing   Problem: Activity: Goal: Risk for activity intolerance will decrease Outcome: Not Progressing   Problem: Nutrition: Goal: Adequate nutrition will be maintained Outcome: Not Progressing   Problem: Coping: Goal: Level of anxiety will decrease Outcome: Not Progressing   Problem: Nutritional: Goal: Ability to achieve adequate nutritional intake will improve Outcome: Not Progressing

## 2020-01-12 NOTE — Progress Notes (Signed)
PROGRESS NOTE    JAELINE Keith   ULA:[id number]  DOB: 10-Dec-1955  DOA: 01/08/2020     4  PCP: Bartholome Bill, MD  CC: abdominal pain  Hospital Course: [name] is a 64 yo CF with PMH schizoaffective d/c, depression, IBS, prolapsed internal hemorrhoids grade 3, tubular adenoma of colon, diverticulosis who presented with abdominal pain. She underwent work-up and was found to have elevated lipase, 1210 and CT findings consistent with uncomplicated pancreatitis with peripancreatic stranding.  She had also not eaten approximately 6 days prior to admission and was n.p.o. for 4 days during hospitalization.  Due to difficulty tolerating trial of clear liquids on 01/12/2020, she underwent repeat CT abdomen/pelvis.  This was notable for showing improved pancreatitis and decreased peripancreatic edema.  No pancreatic necrosis appreciated.   Interval History:  Patient did not tolerate CLD this am and go very nauseous with associated pain. Underwent CT abd/pelvis which was reassuring but she is not wanting to try CLD again. She says to me and staff that she did not eat for 6 days prior to admission and has been in hospital for 4 days now.   Old records reviewed in assessment of this patient  ROS: Constitutional: negative for chills and fevers, Respiratory: negative, Cardiovascular: negative for chest pain and Gastrointestinal: positive for abdominal pain and nausea  Assessment & Plan: Hypokalemia -Replete and recheck as needed  Hypophosphatemia -Replete and recheck as needed  HTN (hypertension) - continue current regimen  Diverticulosis - noted; no evidence of bleeding   Depression - see schizoaffective   Acute pancreatitis - sepsis ruled out after admission -Lipase initially 1210 with CT findings consistent with pancreatitis with peripancreatic edema -Now on hospital day 4 for with ongoing nausea with trial of clear liquid diet and worsening pain -CT abdomen/pelvis  performed on 01/12/2020 revealing improving pancreatitis with improving peripancreatic edema and no pancreatic necrosis -Patient was without nutrition for approximately 6 days prior to admission which makes now total of about 10 days.  Options include NJ tube placement vs PICC/TPN  Cigarette nicotine dependence without complication - nicotine patch   Schizoaffective disorder, depressive type (HCC) - Benztropine 1 mg daily; zoloft 100 mg daily; Geodon 80 mgqhs     Antimicrobials: none  DVT prophylaxis: Lovenox  Code Status: Full Family Communication: none present Disposition Plan: Status is: Inpatient  Remains inpatient appropriate because:Persistent severe electrolyte disturbances, Unsafe d/c plan, IV treatments appropriate due to intensity of illness or inability to take PO and Inpatient level of care appropriate due to severity of illness   Dispo: The patient is from: Home              Anticipated d/c is to: Home              Anticipated d/c date is: 3 days              Patient currently is not medically stable to d/c.       Objective: Blood pressure (!) 157/88, pulse 73, temperature 97.7 F (36.5 C), temperature source Oral, resp. rate 17, height 5\' 2"  (1.575 m), weight 72.2 kg, SpO2 94 %.  Examination: General appearance: obese adult woman laying in bed in NAD Head: Normocephalic, without obvious abnormality, atraumatic Eyes: EOMI Lungs: clear to auscultation bilaterally Heart: regular rate and rhythm and S1, S2 normal Abdomen: mild TTP throughout, no R/G, BS present Extremities: no edema Skin: mobility and turgor normal Neurologic: Grossly normal  Consultants:   none  Procedures:  none  Data Reviewed: I have personally reviewed following labs and imaging studies Results for orders placed or performed during the hospital encounter of 01/08/20 (from the past 24 hour(s))  Glucose, capillary     Status: Abnormal   Collection Time: 01/11/20  5:42 PM   Result Value Ref Range   Glucose-Capillary 110 (H) 70 - 99 mg/dL  Glucose, capillary     Status: Abnormal   Collection Time: 01/11/20  8:38 PM  Result Value Ref Range   Glucose-Capillary 101 (H) 70 - 99 mg/dL  Glucose, capillary     Status: None   Collection Time: 01/12/20 12:16 AM  Result Value Ref Range   Glucose-Capillary 79 70 - 99 mg/dL   Comment 1 Notify RN    Comment 2 Document in Chart   Glucose, capillary     Status: None   Collection Time: 01/12/20  4:53 AM  Result Value Ref Range   Glucose-Capillary 80 70 - 99 mg/dL   Comment 1 Notify RN    Comment 2 Document in Chart   CBC with Differential/Platelet     Status: Abnormal   Collection Time: 01/12/20  6:26 AM  Result Value Ref Range   WBC 9.6 4.0 - 10.5 K/uL   RBC 4.02 3.87 - 5.11 MIL/uL   Hemoglobin 12.3 12.0 - 15.0 g/dL   HCT 36.4 36 - 46 %   MCV 90.5 80.0 - 100.0 fL   MCH 30.6 26.0 - 34.0 pg   MCHC 33.8 30.0 - 36.0 g/dL   RDW 17.1 (H) 11.5 - 15.5 %   Platelets 165 150 - 400 K/uL   nRBC 0.0 0.0 - 0.2 %   Neutrophils Relative % 79 %   Neutro Abs 7.8 (H) 1.7 - 7.7 K/uL   Lymphocytes Relative 11 %   Lymphs Abs 1.0 0.7 - 4.0 K/uL   Monocytes Relative 6 %   Monocytes Absolute 0.6 0 - 1 K/uL   Eosinophils Relative 1 %   Eosinophils Absolute 0.1 0 - 0 K/uL   Basophils Relative 1 %   Basophils Absolute 0.1 0 - 0 K/uL   Immature Granulocytes 2 %   Abs Immature Granulocytes 0.16 (H) 0.00 - 0.07 K/uL  Comprehensive metabolic panel     Status: Abnormal   Collection Time: 01/12/20  6:26 AM  Result Value Ref Range   Sodium 139 135 - 145 mmol/L   Potassium 3.3 (L) 3.5 - 5.1 mmol/L   Chloride 101 98 - 111 mmol/L   CO2 24 22 - 32 mmol/L   Glucose, Bld 90 70 - 99 mg/dL   BUN 6 (L) 8 - 23 mg/dL   Creatinine, Ser 0.40 (L) 0.44 - 1.00 mg/dL   Calcium 9.0 8.9 - 10.3 mg/dL   Total Protein 6.5 6.5 - 8.1 g/dL   Albumin 3.4 (L) 3.5 - 5.0 g/dL   AST 22 15 - 41 U/L   ALT 22 0 - 44 U/L   Alkaline Phosphatase 73 38 - 126 U/L    Total Bilirubin 1.3 (H) 0.3 - 1.2 mg/dL   GFR calc non Af Amer >60 >60 mL/min   GFR calc Af Amer >60 >60 mL/min   Anion gap 14 5 - 15  Lactate dehydrogenase     Status: None   Collection Time: 01/12/20  6:26 AM  Result Value Ref Range   LDH 144 98 - 192 U/L  Lipase, blood     Status: None   Collection Time: 01/12/20  6:26 AM  Result Value Ref Range   Lipase 37 11 - 51 U/L  Magnesium     Status: None   Collection Time: 01/12/20  6:26 AM  Result Value Ref Range   Magnesium 2.0 1.7 - 2.4 mg/dL  Phosphorus     Status: Abnormal   Collection Time: 01/12/20  6:26 AM  Result Value Ref Range   Phosphorus 1.9 (L) 2.5 - 4.6 mg/dL  Glucose, capillary     Status: Abnormal   Collection Time: 01/12/20 10:11 AM  Result Value Ref Range   Glucose-Capillary 101 (H) 70 - 99 mg/dL  Glucose, capillary     Status: Abnormal   Collection Time: 01/12/20 12:02 PM  Result Value Ref Range   Glucose-Capillary 139 (H) 70 - 99 mg/dL    Recent Results (from the past 240 hour(s))  Blood culture (routine single)     Status: None (Preliminary result)   Collection Time: 01/08/20  2:49 PM   Specimen: BLOOD LEFT FOREARM  Result Value Ref Range Status   Specimen Description   Final    BLOOD LEFT FOREARM Performed at [facility] [address] [place], [place] [address]    Special Requests   Final    BOTTLES DRAWN AEROBIC AND ANAEROBIC Blood Culture adequate volume Performed at [facility] [address], [place], [place] [address]    Culture   Final    NO GROWTH 4 DAYS Performed at [facility], [place] [address] [place],  [address]    Report Status PENDING  Incomplete  Respiratory Panel by RT PCR (Flu A&B, Covid) - Nasopharyngeal Swab     Status: None   Collection Time: 01/08/20  2:53 PM   Specimen: Nasopharyngeal Swab  Result Value Ref Range Status   SARS Coronavirus 2 by RT PCR NEGATIVE NEGATIVE Final    Comment: (NOTE) SARS-CoV-2 target  nucleic acids are NOT DETECTED.  The SARS-CoV-2 RNA is generally detectable in upper respiratoy specimens during the acute phase of infection. The lowest concentration of SARS-CoV-2 viral copies this assay can detect is 131 copies/mL. A negative result does not preclude SARS-Cov-2 infection and should not be used as the sole basis for treatment or other patient management decisions. A negative result may occur with  improper specimen collection/handling, submission of specimen other than nasopharyngeal swab, presence of viral mutation(s) within the areas targeted by this assay, and inadequate number of viral copies (<131 copies/mL). A negative result must be combined with clinical observations, patient history, and epidemiological information. The expected result is Negative.  Fact Sheet for Patients:  PinkCheek.be  Fact Sheet for Healthcare Providers:  GravelBags.it  This test is no t yet approved or cleared by the [place] FDA and  has been authorized for detection and/or diagnosis of SARS-CoV-2 by FDA under an Emergency Use Authorization (EUA). This EUA will remain  in effect (meaning this test can be used) for the duration of the COVID-19 declaration under Section 564(b)(1) of the Act, 21 U.S.C. section 360bbb-3(b)(1), unless the authorization is terminated or revoked sooner.     Influenza A by PCR NEGATIVE NEGATIVE Final   Influenza B by PCR NEGATIVE NEGATIVE Final    Comment: (NOTE) The Xpert Xpress SARS-CoV-2/FLU/RSV assay is intended as an aid in  the diagnosis of influenza from Nasopharyngeal swab specimens and  should not be used as a sole basis for treatment. Nasal washings and  aspirates are unacceptable for Xpert Xpress SARS-CoV-2/FLU/RSV  testing.  Fact Sheet for  Patients: PinkCheek.be  Fact Sheet for Healthcare  Providers: GravelBags.it  This test is not yet approved or cleared by the [place] FDA and  has been authorized for detection and/or diagnosis of SARS-CoV-2 by  FDA under an Emergency Use Authorization (EUA). This EUA will remain  in effect (meaning this test can be used) for the duration of the  Covid-19 declaration under Section 564(b)(1) of the Act, 21  U.S.C. section 360bbb-3(b)(1), unless the authorization is  terminated or revoked. Performed at [facility], [place] [address], [place], [place] [address]   Culture, blood (single)     Status: None (Preliminary result)   Collection Time: 01/08/20  3:41 PM   Specimen: BLOOD  Result Value Ref Range Status   Specimen Description   Final    BLOOD RIGHT ANTECUBITAL Performed at [facility] [address] [place], [place] [address]    Special Requests   Final    BOTTLES DRAWN AEROBIC AND ANAEROBIC Blood Culture results may not be optimal due to an excessive volume of blood received in culture bottles Performed at [facility] [address], [place], [place] [address]    Culture   Final    NO GROWTH 4 DAYS Performed at [facility], [place] [address], [place], [place] [address]    Report Status PENDING  Incomplete  Urine culture     Status: None   Collection Time: 01/08/20  6:02 PM   Specimen: In/Out Cath Urine  Result Value Ref Range Status   Specimen Description   Final    IN/OUT CATH URINE Performed at [facility] [address], [place], [place] [address]    Special Requests   Final    NONE Performed at [facility], [place] [address] [place], [place] [address]    Culture   Final    NO GROWTH Performed at [facility], [place] [address] [place], [place] [address]    Report Status 01/09/2020 FINAL  Final     Radiology Studies: CT ABDOMEN PELVIS W CONTRAST  Result Date:  01/12/2020 CLINICAL DATA:  Persistent pancreatitis question pancreatic necrosis EXAM: CT ABDOMEN AND PELVIS WITH CONTRAST TECHNIQUE: Multidetector CT imaging of the abdomen and pelvis was performed using the standard protocol following bolus administration of intravenous contrast. Sagittal and coronal MPR images reconstructed from axial data set. CONTRAST:  17mL OMNIPAQUE IOHEXOL 300 MG/ML SOLN IV. No oral contrast. COMPARISON:  01/08/2020 FINDINGS: Lower chest: Linear subsegmental atelectasis LEFT lower lobe. Tiny LEFT pleural effusion. Hepatobiliary: Gallbladder surgically absent. Liver normal appearance. No biliary dilatation. Pancreas: Improved changes of acute pancreatitis with significantly decreased peripancreatic edema. Slight fatty replacement at pancreatic body/head unchanged. No focal pancreatic mass or necrosis. No abnormal pancreatic fluid collections, calcifications or ductal dilatation. Spleen: Normal appearance. 2.7 cm diameter splenule medial to spleen. Adrenals/Urinary Tract: Adrenal glands and RIGHT kidney normal appearance. Large cyst inferior pole LEFT kidney measuring 7.0 x 6.1 x 6.1 cm. No urinary tract calcification or dilatation. Bladder and ureters unremarkable. Stomach/Bowel: Normal appendix. Minimal sigmoid diverticulosis without evidence of diverticulitis. Stomach and bowel loops otherwise normal appearance. Vascular/Lymphatic: Atherosclerotic calcifications aorta iliac arteries without aneurysm. Aorta normal caliber. No adenopathy. Reproductive: Unremarkable uterus and adnexa Other: No free air or free fluid. No hernia or in pneumothorax process. Musculoskeletal: Bones demineralized. Probable vertebral hemangioma L1. IMPRESSION: Improved changes of acute pancreatitis with significantly decreased peripancreatic edema. Large LEFT renal cyst 7.0 x 6.1 x 6.1  cm. Minimal sigmoid diverticulosis without evidence of diverticulitis. Aortic Atherosclerosis (ICD10-I70.0). Electronically Signed    By: Lavonia Dana M.D.   On: 01/12/2020 14:54   [place] EKG SITE RITE  Result Date: 01/12/2020 If Site Rite image not attached, placement could not be confirmed due to current cardiac rhythm.  [place] EKG SITE RITE  Final Result    CT ABDOMEN PELVIS W CONTRAST  Final Result    CT Head Wo Contrast  Final Result    CT Angio Chest/Abd/Pel for Dissection W and/or W/WO  Final Result    DG Chest Port 1 View  Final Result      Scheduled Meds: . benztropine  1 mg Oral Daily  . hydrALAZINE  7 mg Intravenous Q6H  . insulin aspart  0-9 Units Subcutaneous Q4H  . metoprolol tartrate  5 mg Intravenous Q8H  . nicotine  14 mg Transdermal Daily  . [START ON 01/13/2020] pantoprazole (PROTONIX) IV  40 mg Intravenous Q24H  . sertraline  100 mg Oral Daily  . ziprasidone  80 mg Oral QHS   PRN Meds: lip balm, morphine injection, ondansetron (ZOFRAN) IV, polyethylene glycol, traZODone Continuous Infusions: . lactated ringers 100 mL/hr at 01/12/20 0114      LOS: 4 days  Time spent: Greater than 50% of the 35 minute visit was spent in counseling/coordination of care for the patient as laid out in the A&P.   Dwyane Dee, MD [facility] 01/12/2020, 3:39 PM  Contact via secure chat.  To contact the attending provider between 7A-7P or the covering provider during after hours 7P-7A, please log into the web site [email or web address] and access using universal [place] password for that web site. If you do not have the password, please call the hospital operator.

## 2020-01-12 NOTE — Assessment & Plan Note (Signed)
-   nicotine patch ?

## 2020-01-13 DIAGNOSIS — K8500 Idiopathic acute pancreatitis without necrosis or infection: Secondary | ICD-10-CM | POA: Diagnosis not present

## 2020-01-13 LAB — CULTURE, BLOOD (SINGLE)
Culture: NO GROWTH
Culture: NO GROWTH
Special Requests: ADEQUATE

## 2020-01-13 LAB — CBC WITH DIFFERENTIAL/PLATELET
Abs Immature Granulocytes: 0.17 10*3/uL — ABNORMAL HIGH (ref 0.00–0.07)
Basophils Absolute: 0.1 10*3/uL (ref 0.0–0.1)
Basophils Relative: 1 %
Eosinophils Absolute: 0.1 10*3/uL (ref 0.0–0.5)
Eosinophils Relative: 2 %
HCT: 33.1 % — ABNORMAL LOW (ref 36.0–46.0)
Hemoglobin: 11.1 g/dL — ABNORMAL LOW (ref 12.0–15.0)
Immature Granulocytes: 2 %
Lymphocytes Relative: 12 %
Lymphs Abs: 1 10*3/uL (ref 0.7–4.0)
MCH: 30.5 pg (ref 26.0–34.0)
MCHC: 33.5 g/dL (ref 30.0–36.0)
MCV: 90.9 fL (ref 80.0–100.0)
Monocytes Absolute: 0.6 10*3/uL (ref 0.1–1.0)
Monocytes Relative: 7 %
Neutro Abs: 6.2 10*3/uL (ref 1.7–7.7)
Neutrophils Relative %: 76 %
Platelets: 161 10*3/uL (ref 150–400)
RBC: 3.64 MIL/uL — ABNORMAL LOW (ref 3.87–5.11)
RDW: 16.8 % — ABNORMAL HIGH (ref 11.5–15.5)
WBC: 8.1 10*3/uL (ref 4.0–10.5)
nRBC: 0 % (ref 0.0–0.2)

## 2020-01-13 LAB — GLUCOSE, CAPILLARY
Glucose-Capillary: 79 mg/dL (ref 70–99)
Glucose-Capillary: 93 mg/dL (ref 70–99)
Glucose-Capillary: 152 mg/dL — ABNORMAL HIGH (ref 70–99)
Glucose-Capillary: 101 mg/dL — ABNORMAL HIGH (ref 70–99)
Glucose-Capillary: 174 mg/dL — ABNORMAL HIGH (ref 70–99)

## 2020-01-13 LAB — COMPREHENSIVE METABOLIC PANEL
ALT: 18 U/L (ref 0–44)
AST: 19 U/L (ref 15–41)
Albumin: 2.8 g/dL — ABNORMAL LOW (ref 3.5–5.0)
Alkaline Phosphatase: 65 U/L (ref 38–126)
Anion gap: 14 (ref 5–15)
BUN: 6 mg/dL — ABNORMAL LOW (ref 8–23)
CO2: 23 mmol/L (ref 22–32)
Calcium: 8.8 mg/dL — ABNORMAL LOW (ref 8.9–10.3)
Chloride: 102 mmol/L (ref 98–111)
Creatinine, Ser: 0.44 mg/dL (ref 0.44–1.00)
GFR calc Af Amer: >60 mL/min (ref >60)
GFR calc non Af Amer: >60 mL/min (ref >60)
Glucose, Bld: 84 mg/dL (ref 70–99)
Potassium: 2.6 mmol/L — CL (ref 3.5–5.1)
Sodium: 139 mmol/L (ref 135–145)
Total Bilirubin: 1.5 mg/dL — ABNORMAL HIGH (ref 0.3–1.2)
Total Protein: 5.6 g/dL — ABNORMAL LOW (ref 6.5–8.1)

## 2020-01-13 LAB — PHOSPHORUS: Phosphorus: 2.6 mg/dL (ref 2.5–4.6)

## 2020-01-13 LAB — MAGNESIUM: Magnesium: 1.9 mg/dL (ref 1.7–2.4)

## 2020-01-13 MED ORDER — POTASSIUM CHLORIDE 10 MEQ/100ML IV SOLN
10.0000 meq | INTRAVENOUS | Status: DC
  Administered 2020-01-13: 10 meq via INTRAVENOUS
  Filled 2020-01-13: qty 100

## 2020-01-13 MED ORDER — BOOST / RESOURCE BREEZE PO LIQD CUSTOM
1.0000 | Freq: Three times a day (TID) | ORAL | Status: DC
  Administered 2020-01-13 – 2020-01-19 (×13): 1 via ORAL

## 2020-01-13 MED ORDER — POTASSIUM CHLORIDE 10 MEQ/100ML IV SOLN
10.0000 meq | INTRAVENOUS | Status: AC
  Administered 2020-01-13 (×5): 10 meq via INTRAVENOUS
  Filled 2020-01-13 (×4): qty 100

## 2020-01-13 NOTE — Progress Notes (Signed)
Initial Nutrition Assessment  INTERVENTION:   -Boost Breeze po TID, each supplement provides 250 kcal and 9 grams of protein   NUTRITION DIAGNOSIS:   Increased nutrient needs related to acute illness (acute pancreatitis) as evidenced by estimated needs.  GOAL:   Patient will meet greater than or equal to 90% of their needs  MONITOR:   PO intake, Supplement acceptance, Labs, Weight trends, I & O's  REASON FOR ASSESSMENT:   Consult Enteral/tube feeding initiation and management  ASSESSMENT:   64 yo CF with PMH schizoaffective d/c, depression, IBS, prolapsed internal hemorrhoids grade 3, tubular adenoma of colon, diverticulosis who presented with abdominal pain. Admitted for acute pancreatitis.  Patient in room with clear liquid tray. Pt eating some broth, jello and iced tea. States she is tolerating so far. Denies nausea and pain is controlled currently.  Pt reports she asked MD to give her one more chance to tolerate clears prior to placing NJT. Will continue to monitor and can provide recommendations if enteral feeds are needed in the future.  Pt is willing to try Boost Breeze supplements for added protein to diet. Pt states she has been unable to tolerate any PO for ~2 weeks now r/t constipation, abdominal pain and N/V.  Admission weight: 162 lbs. Current weight: 162 lbs.  Medications: Lactated ringers infusion, KCl  Labs reviewed:  CBGs: 93-152 Low K Mg/Phos WNL  NUTRITION - FOCUSED PHYSICAL EXAM:    Most Recent Value  Orbital Region No depletion  Upper Arm Region No depletion  Buccal Region No depletion  Temple Region No depletion  Clavicle Bone Region Mild depletion  Clavicle and Acromion Bone Region No depletion  Scapular Bone Region No depletion  Dorsal Hand No depletion  Patellar Region Unable to assess  Anterior Thigh Region Unable to assess  Posterior Calf Region Unable to assess       Diet Order:   Diet Order            Diet clear liquid Room  service appropriate? Yes; Fluid consistency: Thin  Diet effective now                 EDUCATION NEEDS:   Education needs have been addressed  Skin:  Skin Assessment: Reviewed RN Assessment  Last BM:  10/4 -type 1  Height:   Ht Readings from Last 1 Encounters:  01/08/20 5\' 2"  (1.575 m)    Weight:   Wt Readings from Last 1 Encounters:  01/13/20 73.7 kg    BMI:  Body mass index is 29.72 kg/m.  Estimated Nutritional Needs:   Kcal:  1850-2050  Protein:  85-100g  Fluid:  2L/day    Clayton Bibles, MS, RD, LDN Inpatient Clinical Dietitian Contact information available via Amion

## 2020-01-13 NOTE — Progress Notes (Signed)
CRITICAL VALUE ALERT  Critical Value:  K+ 2.6  Date & Time Notied:  01/13/2020 at 0700  Provider Notified: Sabino Gasser, MD  Orders Received/Actions taken: Pending orders.

## 2020-01-13 NOTE — Plan of Care (Signed)
  Problem: Health Behavior/Discharge Planning: Goal: Ability to manage health-related needs will improve Outcome: Progressing   Problem: Clinical Measurements: Goal: Ability to maintain clinical measurements within normal limits will improve Outcome: Progressing Goal: Diagnostic test results will improve Outcome: Progressing   Problem: Activity: Goal: Risk for activity intolerance will decrease Outcome: Progressing   Problem: Nutrition: Goal: Adequate nutrition will be maintained Outcome: Progressing   Problem: Coping: Goal: Level of anxiety will decrease Outcome: Progressing   Problem: Elimination: Goal: Will not experience complications related to bowel motility Outcome: Progressing   Problem: Pain Managment: Goal: General experience of comfort will improve Outcome: Progressing   Problem: Safety: Goal: Ability to remain free from injury will improve Outcome: Progressing   Problem: Nutritional: Goal: Ability to achieve adequate nutritional intake will improve Outcome: Progressing   Problem: Clinical Measurements: Goal: Complications related to the disease process, condition or treatment will be avoided or minimized Outcome: Progressing

## 2020-01-13 NOTE — Progress Notes (Signed)
PROGRESS NOTE    [name]   IOM:[id number]  DOB: 1956-02-15  DOA: 01/08/2020     5  PCP: Bartholome Bill, MD  CC: abdominal pain  Hospital Course: [name] is a 64 yo CF with PMH schizoaffective d/c, depression, IBS, prolapsed internal hemorrhoids grade 3, tubular adenoma of colon, diverticulosis who presented with abdominal pain. She underwent work-up and was found to have elevated lipase, 1210 and CT findings consistent with uncomplicated pancreatitis with peripancreatic stranding.  She had also not eaten approximately 6 days prior to admission and was n.p.o. for 4 days during hospitalization.  Due to difficulty tolerating trial of clear liquids on 01/12/2020, she underwent repeat CT abdomen/pelvis.  This was notable for showing improved pancreatitis and decreased peripancreatic edema.  No pancreatic necrosis appreciated.  Due to her prolonged n.p.o. status, attempt was made for NJ tube placement on 01/12/2020 however patient declined further attempts of placement.  On 01/13/2020, she wished to reattempt clear liquid diet trial and attempts to avoid enteral feeds and continue advancing her diet.   Interval History:  No events overnight. Patient did not want NJT yesterday and today is asking me if she can try CLD again. I have cancelled NJT and started CLD. She has tolerated so far.  Denies N/V, abd pain.   Old records reviewed in assessment of this patient  ROS: Constitutional: negative for chills and fevers, Respiratory: negative, Cardiovascular: negative for chest pain and Gastrointestinal: positive for abdominal pain and nausea  Assessment & Plan: * Acute pancreatitis - sepsis ruled out after admission -Lipase initially 1210 with CT findings consistent with pancreatitis with peripancreatic edema -Now on hospital day 4 for with ongoing nausea with trial of clear liquid diet and worsening pain -CT abdomen/pelvis performed on 01/12/2020 revealing improving pancreatitis  with improving peripancreatic edema and no pancreatic necrosis -Patient was without nutrition for approximately 6 days prior to admission which makes now total of about 10 days.  Options include NJ tube placement vs PICC/TPN - patient did not want NJ placed on 10/4 and today she now says her pain is gone and her nausea is minimal and she wants to attempt a CLD this morning - cancelled NJT and restarted CLD; if tolerates would like to advance semi-quickly as I believe there was a psych component to her not wanting to try CLD much yesterday; especially with reassuring labs/lipase/CT abd all of which show resolving pancreatitis - FLD by 10/6 then low residue diet that evening and d/c home on 10/7 (if not sooner)  Hypokalemia -Replete and recheck as needed  Hypophosphatemia -Replete and recheck as needed  HTN (hypertension) - continue current regimen  Diverticulosis - noted; no evidence of bleeding   Depression - see schizoaffective   Cigarette nicotine dependence without complication - nicotine patch   Schizoaffective disorder, depressive type (HCC) - Benztropine 1 mg daily; zoloft 100 mg daily; Geodon 80 mgqhs    Antimicrobials: none  DVT prophylaxis: Lovenox  Code Status: Full Family Communication: none present Disposition Plan: Status is: Inpatient  Remains inpatient appropriate because:Persistent severe electrolyte disturbances, Unsafe d/c plan, IV treatments appropriate due to intensity of illness or inability to take PO and Inpatient level of care appropriate due to severity of illness   Dispo: The patient is from: Home              Anticipated d/c is to: Home              Anticipated d/c date is: 3  days              Patient currently is not medically stable to d/c.  Objective: Blood pressure (!) 135/119, pulse 69, temperature 98 F (36.7 C), temperature source Oral, resp. rate 17, height 5\' 2"  (1.575 m), weight 73.7 kg, SpO2 98 %.  Examination: General  appearance: obese adult woman laying in bed in NAD Head: Normocephalic, without obvious abnormality, atraumatic Eyes: EOMI Lungs: clear to auscultation bilaterally Heart: regular rate and rhythm and S1, S2 normal Abdomen: mild TTP throughout, no R/G, BS present Extremities: no edema Skin: mobility and turgor normal Neurologic: Grossly normal  Consultants:   none  Procedures:   none  Data Reviewed: I have personally reviewed following labs and imaging studies Results for orders placed or performed during the hospital encounter of 01/08/20 (from the past 24 hour(s))  Glucose, capillary     Status: None   Collection Time: 01/12/20  4:58 PM  Result Value Ref Range   Glucose-Capillary 83 70 - 99 mg/dL  Glucose, capillary     Status: None   Collection Time: 01/12/20  7:30 PM  Result Value Ref Range   Glucose-Capillary 92 70 - 99 mg/dL   Comment 1 Notify RN    Comment 2 Document in Chart   Glucose, capillary     Status: None   Collection Time: 01/12/20 11:02 PM  Result Value Ref Range   Glucose-Capillary 89 70 - 99 mg/dL   Comment 1 Notify RN    Comment 2 Document in Chart   Glucose, capillary     Status: None   Collection Time: 01/13/20  3:27 AM  Result Value Ref Range   Glucose-Capillary 79 70 - 99 mg/dL   Comment 1 Notify RN    Comment 2 Document in Chart   CBC with Differential/Platelet     Status: Abnormal   Collection Time: 01/13/20  5:58 AM  Result Value Ref Range   WBC 8.1 4.0 - 10.5 K/uL   RBC 3.64 (L) 3.87 - 5.11 MIL/uL   Hemoglobin 11.1 (L) 12.0 - 15.0 g/dL   HCT 33.1 (L) 36 - 46 %   MCV 90.9 80.0 - 100.0 fL   MCH 30.5 26.0 - 34.0 pg   MCHC 33.5 30.0 - 36.0 g/dL   RDW 16.8 (H) 11.5 - 15.5 %   Platelets 161 150 - 400 K/uL   nRBC 0.0 0.0 - 0.2 %   Neutrophils Relative % 76 %   Neutro Abs 6.2 1.7 - 7.7 K/uL   Lymphocytes Relative 12 %   Lymphs Abs 1.0 0.7 - 4.0 K/uL   Monocytes Relative 7 %   Monocytes Absolute 0.6 0 - 1 K/uL   Eosinophils Relative 2 %    Eosinophils Absolute 0.1 0 - 0 K/uL   Basophils Relative 1 %   Basophils Absolute 0.1 0 - 0 K/uL   Immature Granulocytes 2 %   Abs Immature Granulocytes 0.17 (H) 0.00 - 0.07 K/uL  Comprehensive metabolic panel     Status: Abnormal   Collection Time: 01/13/20  5:58 AM  Result Value Ref Range   Sodium 139 135 - 145 mmol/L   Potassium 2.6 (LL) 3.5 - 5.1 mmol/L   Chloride 102 98 - 111 mmol/L   CO2 23 22 - 32 mmol/L   Glucose, Bld 84 70 - 99 mg/dL   BUN 6 (L) 8 - 23 mg/dL   Creatinine, Ser 0.44 0.44 - 1.00 mg/dL   Calcium 8.8 (L) 8.9 -  10.3 mg/dL   Total Protein 5.6 (L) 6.5 - 8.1 g/dL   Albumin 2.8 (L) 3.5 - 5.0 g/dL   AST 19 15 - 41 U/L   ALT 18 0 - 44 U/L   Alkaline Phosphatase 65 38 - 126 U/L   Total Bilirubin 1.5 (H) 0.3 - 1.2 mg/dL   GFR calc non Af Amer >60 >60 mL/min   GFR calc Af Amer >60 >60 mL/min   Anion gap 14 5 - 15  Magnesium     Status: None   Collection Time: 01/13/20  5:58 AM  Result Value Ref Range   Magnesium 1.9 1.7 - 2.4 mg/dL  Phosphorus     Status: None   Collection Time: 01/13/20  5:58 AM  Result Value Ref Range   Phosphorus 2.6 2.5 - 4.6 mg/dL  Glucose, capillary     Status: None   Collection Time: 01/13/20  8:04 AM  Result Value Ref Range   Glucose-Capillary 93 70 - 99 mg/dL  Glucose, capillary     Status: Abnormal   Collection Time: 01/13/20 12:50 PM  Result Value Ref Range   Glucose-Capillary 152 (H) 70 - 99 mg/dL    Recent Results (from the past 240 hour(s))  Blood culture (routine single)     Status: None   Collection Time: 01/08/20  2:49 PM   Specimen: BLOOD LEFT FOREARM  Result Value Ref Range Status   Specimen Description   Final    BLOOD LEFT FOREARM Performed at [facility], [place] [address], [place], [place] [address]    Special Requests   Final    BOTTLES DRAWN AEROBIC AND ANAEROBIC Blood Culture adequate volume Performed at [place] [address], [place], [place] [address]    Culture    Final    NO GROWTH 5 DAYS Performed at [facility], [place] [address] [place], [place] [address]    Report Status 01/13/2020 FINAL  Final  Respiratory Panel by RT PCR (Flu A&B, Covid) - Nasopharyngeal Swab     Status: None   Collection Time: 01/08/20  2:53 PM   Specimen: Nasopharyngeal Swab  Result Value Ref Range Status   SARS Coronavirus 2 by RT PCR NEGATIVE NEGATIVE Final    Comment: (NOTE) SARS-CoV-2 target nucleic acids are NOT DETECTED.  The SARS-CoV-2 RNA is generally detectable in upper respiratoy specimens during the acute phase of infection. The lowest concentration of SARS-CoV-2 viral copies this assay can detect is 131 copies/mL. A negative result does not preclude SARS-Cov-2 infection and should not be used as the sole basis for treatment or other patient management decisions. A negative result may occur with  improper specimen collection/handling, submission of specimen other than nasopharyngeal swab, presence of viral mutation(s) within the areas targeted by this assay, and inadequate number of viral copies (<131 copies/mL). A negative result must be combined with clinical observations, patient history, and epidemiological information. The expected result is Negative.  Fact Sheet for Patients:  PinkCheek.be  Fact Sheet for Healthcare Providers:  GravelBags.it  This test is no t yet approved or cleared by the [place] FDA and  has been authorized for detection and/or diagnosis of SARS-CoV-2 by FDA under an Emergency Use Authorization (EUA). This EUA will remain  in effect (meaning this test can be used) for the duration of the COVID-19 declaration under Section 564(b)(1) of the Act, 21 U.S.C. section 360bbb-3(b)(1), unless the authorization is terminated or revoked sooner.     Influenza  A by PCR NEGATIVE NEGATIVE Final   Influenza B by PCR NEGATIVE NEGATIVE Final    Comment: (NOTE) The Xpert  Xpress SARS-CoV-2/FLU/RSV assay is intended as an aid in  the diagnosis of influenza from Nasopharyngeal swab specimens and  should not be used as a sole basis for treatment. Nasal washings and  aspirates are unacceptable for Xpert Xpress SARS-CoV-2/FLU/RSV  testing.  Fact Sheet for Patients: PinkCheek.be  Fact Sheet for Healthcare Providers: GravelBags.it  This test is not yet approved or cleared by the [place] FDA and  has been authorized for detection and/or diagnosis of SARS-CoV-2 by  FDA under an Emergency Use Authorization (EUA). This EUA will remain  in effect (meaning this test can be used) for the duration of the  Covid-19 declaration under Section 564(b)(1) of the Act, 21  U.S.C. section 360bbb-3(b)(1), unless the authorization is  terminated or revoked. Performed at [facility], [place] [address], [place], [place] [address]   Culture, blood (single)     Status: None   Collection Time: 01/08/20  3:41 PM   Specimen: BLOOD  Result Value Ref Range Status   Specimen Description   Final    BLOOD RIGHT ANTECUBITAL Performed at [place] [address] [place], [place] [address]    Special Requests   Final    BOTTLES DRAWN AEROBIC AND ANAEROBIC Blood Culture results may not be optimal due to an excessive volume of blood received in culture bottles Performed at [place] [address], [place], [place] [address]    Culture   Final    NO GROWTH 5 DAYS Performed at [facility], [place] [address] [place], [place] [address]    Report Status 01/13/2020 FINAL  Final  Urine culture     Status: None   Collection Time: 01/08/20  6:02 PM   Specimen: In/Out Cath Urine  Result Value Ref Range Status   Specimen Description   Final    IN/OUT CATH URINE Performed at [place] [address] [place], [place] [address]    Special  Requests   Final    NONE Performed at [facility], [place] [address], [place],  [address]    Culture   Final    NO GROWTH Performed at [facility], [place] [address] [place],  [address]    Report Status 01/09/2020 FINAL  Final     Radiology Studies: CT ABDOMEN PELVIS W CONTRAST  Result Date: 01/12/2020 CLINICAL DATA:  Persistent pancreatitis question pancreatic necrosis EXAM: CT ABDOMEN AND PELVIS WITH CONTRAST TECHNIQUE: Multidetector CT imaging of the abdomen and pelvis was performed using the standard protocol following bolus administration of intravenous contrast. Sagittal and coronal MPR images reconstructed from axial data set. CONTRAST:  168mL OMNIPAQUE IOHEXOL 300 MG/ML SOLN IV. No oral contrast. COMPARISON:  01/08/2020 FINDINGS: Lower chest: Linear subsegmental atelectasis LEFT lower lobe. Tiny LEFT pleural effusion. Hepatobiliary: Gallbladder surgically absent. Liver normal appearance. No biliary dilatation. Pancreas: Improved changes of acute pancreatitis with significantly decreased peripancreatic edema. Slight fatty replacement at pancreatic body/head unchanged. No focal pancreatic mass or necrosis. No abnormal pancreatic fluid collections, calcifications or ductal dilatation. Spleen: Normal appearance. 2.7 cm diameter splenule medial to spleen. Adrenals/Urinary Tract: Adrenal glands and RIGHT kidney normal appearance. Large cyst inferior pole LEFT kidney measuring 7.0 x 6.1 x 6.1 cm. No urinary tract calcification or dilatation. Bladder and ureters unremarkable. Stomach/Bowel: Normal appendix. Minimal sigmoid diverticulosis without  evidence of diverticulitis. Stomach and bowel loops otherwise normal appearance. Vascular/Lymphatic: Atherosclerotic calcifications aorta iliac arteries without aneurysm. Aorta normal caliber. No adenopathy. Reproductive: Unremarkable uterus and adnexa Other: No free air or free fluid. No hernia or in pneumothorax process.  Musculoskeletal: Bones demineralized. Probable vertebral hemangioma L1. IMPRESSION: Improved changes of acute pancreatitis with significantly decreased peripancreatic edema. Large LEFT renal cyst 7.0 x 6.1 x 6.1 cm. Minimal sigmoid diverticulosis without evidence of diverticulitis. Aortic Atherosclerosis (ICD10-I70.0). Electronically Signed   By: Lavonia Dana M.D.   On: 01/12/2020 14:54   [place] EKG SITE RITE  Result Date: 01/12/2020 If Site Rite image not attached, placement could not be confirmed due to current cardiac rhythm.  [place] EKG SITE RITE  Final Result    CT ABDOMEN PELVIS W CONTRAST  Final Result    CT Head Wo Contrast  Final Result    CT Angio Chest/Abd/Pel for Dissection W and/or W/WO  Final Result    DG Chest Port 1 View  Final Result      Scheduled Meds:  benztropine  1 mg Oral Daily   enoxaparin (LOVENOX) injection  40 mg Subcutaneous Q24H   feeding supplement  1 Container Oral TID BM   hydrALAZINE  7 mg Intravenous Q6H   insulin aspart  0-9 Units Subcutaneous Q4H   metoprolol tartrate  5 mg Intravenous Q8H   nicotine  14 mg Transdermal Daily   pantoprazole (PROTONIX) IV  40 mg Intravenous Q24H   sertraline  100 mg Oral Daily   ziprasidone  80 mg Oral QHS   PRN Meds: lip balm, morphine injection, ondansetron (ZOFRAN) IV, polyethylene glycol, traZODone Continuous Infusions:  lactated ringers 100 mL/hr at 01/13/20 0115   potassium chloride 10 mEq (01/13/20 1246)      LOS: 5 days  Time spent: Greater than 50% of the 35 minute visit was spent in counseling/coordination of care for the patient as laid out in the A&P.   Dwyane Dee, MD [facility] 01/13/2020, 2:40 PM  Contact via secure chat.  To contact the attending provider between 7A-7P or the covering provider during after hours 7P-7A, please log into the web site [email or web address] and access using universal [place] password for that web site. If you do not have the password, please call  the hospital operator.

## 2020-01-14 DIAGNOSIS — K859 Acute pancreatitis without necrosis or infection, unspecified: Principal | ICD-10-CM

## 2020-01-14 DIAGNOSIS — F320 Major depressive disorder, single episode, mild: Secondary | ICD-10-CM | POA: Diagnosis not present

## 2020-01-14 LAB — GLUCOSE, CAPILLARY
Glucose-Capillary: 115 mg/dL — ABNORMAL HIGH (ref 70–99)
Glucose-Capillary: 130 mg/dL — ABNORMAL HIGH (ref 70–99)
Glucose-Capillary: 132 mg/dL — ABNORMAL HIGH (ref 70–99)
Glucose-Capillary: 151 mg/dL — ABNORMAL HIGH (ref 70–99)
Glucose-Capillary: 167 mg/dL — ABNORMAL HIGH (ref 70–99)
Glucose-Capillary: 171 mg/dL — ABNORMAL HIGH (ref 70–99)

## 2020-01-14 LAB — CBC WITH DIFFERENTIAL/PLATELET
Abs Immature Granulocytes: 0.19 10*3/uL — ABNORMAL HIGH (ref 0.00–0.07)
Basophils Absolute: 0.1 10*3/uL (ref 0.0–0.1)
Basophils Relative: 1 %
Eosinophils Absolute: 0.3 10*3/uL (ref 0.0–0.5)
Eosinophils Relative: 3 %
HCT: 33.3 % — ABNORMAL LOW (ref 36.0–46.0)
Hemoglobin: 11 g/dL — ABNORMAL LOW (ref 12.0–15.0)
Immature Granulocytes: 2 %
Lymphocytes Relative: 13 %
Lymphs Abs: 1.2 10*3/uL (ref 0.7–4.0)
MCH: 30.8 pg (ref 26.0–34.0)
MCHC: 33 g/dL (ref 30.0–36.0)
MCV: 93.3 fL (ref 80.0–100.0)
Monocytes Absolute: 0.9 10*3/uL (ref 0.1–1.0)
Monocytes Relative: 10 %
Neutro Abs: 6.7 10*3/uL (ref 1.7–7.7)
Neutrophils Relative %: 71 %
Platelets: 198 10*3/uL (ref 150–400)
RBC: 3.57 MIL/uL — ABNORMAL LOW (ref 3.87–5.11)
RDW: 16.8 % — ABNORMAL HIGH (ref 11.5–15.5)
WBC: 9.3 10*3/uL (ref 4.0–10.5)
nRBC: 0 % (ref 0.0–0.2)

## 2020-01-14 LAB — COMPREHENSIVE METABOLIC PANEL
ALT: 17 U/L (ref 0–44)
AST: 17 U/L (ref 15–41)
Albumin: 3 g/dL — ABNORMAL LOW (ref 3.5–5.0)
Alkaline Phosphatase: 64 U/L (ref 38–126)
Anion gap: 9 (ref 5–15)
BUN: <5 mg/dL — ABNORMAL LOW (ref 8–23)
CO2: 25 mmol/L (ref 22–32)
Calcium: 8.7 mg/dL — ABNORMAL LOW (ref 8.9–10.3)
Chloride: 101 mmol/L (ref 98–111)
Creatinine, Ser: 0.36 mg/dL — ABNORMAL LOW (ref 0.44–1.00)
GFR calc non Af Amer: >60 mL/min (ref >60)
Glucose, Bld: 113 mg/dL — ABNORMAL HIGH (ref 70–99)
Potassium: 3.1 mmol/L — ABNORMAL LOW (ref 3.5–5.1)
Sodium: 135 mmol/L (ref 135–145)
Total Bilirubin: 0.8 mg/dL (ref 0.3–1.2)
Total Protein: 5.8 g/dL — ABNORMAL LOW (ref 6.5–8.1)

## 2020-01-14 LAB — PHOSPHORUS: Phosphorus: 2.1 mg/dL — ABNORMAL LOW (ref 2.5–4.6)

## 2020-01-14 LAB — MAGNESIUM: Magnesium: 1.6 mg/dL — ABNORMAL LOW (ref 1.7–2.4)

## 2020-01-14 MED ORDER — POTASSIUM CHLORIDE CRYS ER 20 MEQ PO TBCR
40.0000 meq | EXTENDED_RELEASE_TABLET | Freq: Two times a day (BID) | ORAL | Status: AC
  Administered 2020-01-14 (×2): 40 meq via ORAL
  Filled 2020-01-14 (×2): qty 2

## 2020-01-14 MED ORDER — MAGNESIUM SULFATE 4 GM/100ML IV SOLN
4.0000 g | Freq: Once | INTRAVENOUS | Status: AC
  Administered 2020-01-14: 4 g via INTRAVENOUS
  Filled 2020-01-14: qty 100

## 2020-01-14 MED ORDER — ZIPRASIDONE HCL 80 MG PO CAPS
80.0000 mg | ORAL_CAPSULE | Freq: Every day | ORAL | 0 refills | Status: DC
Start: 2020-01-14 — End: 2020-03-25

## 2020-01-14 MED ORDER — TRAZODONE HCL 50 MG PO TABS: 25.0000 mg | ORAL_TABLET | Freq: Every evening | ORAL | 0 refills | Status: DC | PRN

## 2020-01-14 MED ORDER — SERTRALINE HCL 100 MG PO TABS
100.0000 mg | ORAL_TABLET | Freq: Every day | ORAL | 0 refills | Status: DC
Start: 2020-01-15 — End: 2020-03-25

## 2020-01-14 NOTE — Progress Notes (Signed)
PROGRESS NOTE    [name]  WUJ:[id number] DOB: 1955-08-25 DOA: 01/08/2020 PCP: Bartholome Bill, MD    Brief Narrative:  64 yo CF with PMH schizoaffective d/c, depression, IBS, prolapsed internal hemorrhoids grade 3, tubular adenoma of colon, diverticulosis who presented with abdominal pain. She underwent work-up and was found to have elevated lipase, 1210 and CT findings consistent with uncomplicated pancreatitis with peripancreatic stranding.  She had also not eaten approximately 6 days prior to admission and was n.p.o. for 4 days during hospitalization.  Due to difficulty tolerating trial of clear liquids on 01/12/2020, she underwent repeat CT abdomen/pelvis.  This was notable for showing improved pancreatitis and decreased peripancreatic edema.  No pancreatic necrosis appreciated  Assessment & Plan:   Principal Problem:   Acute pancreatitis Active Problems:   Schizoaffective disorder, depressive type (HCC)   Cigarette nicotine dependence without complication   Depression   Diverticulosis   HTN (hypertension)   Hypophosphatemia   Hypokalemia  Hypokalemia -Remains low at 3.1 -Will replace -Repeat lytes in AM  Hypophosphatemia -recently replaced -Will repeat level in AM  HTN (hypertension) - continue current regimen -BP stable at present  Diverticulosis -No evidence of bleeding noted  Depression - seems stable at this time  Acute pancreatitis - sepsis ruled out after admission -Lipase initially 1210 with CT findings consistent with pancreatitis with peripancreatic edema -Lipase now trended down to normal with repeat CT findings indicating much improved pancreatitis -Tolerated clears and full liquids. Did not tolerate low fat diet, will downgrade to fulls tonight  Cigarette nicotine dependence without complication - Continue nicotine patch   Schizoaffective disorder, depressive type (HCC) - Benztropine 1 mg daily; zoloft 100 mg daily; Geodon 80  mgqhs   DVT prophylaxis: Lovenox subq Code Status: Full Family Communication: Pt in room, family not at bedside  Status is: Inpatient  Remains inpatient appropriate because:Ongoing active pain requiring inpatient pain management   Dispo: The patient is from: Home              Anticipated d/c is to: Home              Anticipated d/c date is: 1 day              Patient currently is not medically stable to d/c.       Consultants:     Procedures:     Antimicrobials: Anti-infectives (From admission, onward)   Start     Dose/Rate Route Frequency Ordered Stop   01/08/20 1600  meropenem (MERREM) 1 g in sodium chloride 0.9 % 100 mL IVPB  Status:  Discontinued        1 g 200 mL/hr over 30 Minutes Intravenous Every 8 hours 01/08/20 1540 01/09/20 1338       Subjective: Without complaints or abd pain this am while on full liquid diet, staff reports abd pain on trial of low fat diet  Objective: Vitals:   01/13/20 2016 01/14/20 0536 01/14/20 0538 01/14/20 1400  BP: 130/66  132/61 131/77  Pulse: 74  69 67  Resp: 18  14   Temp: 98.5 F (36.9 C)  98.4 F (36.9 C) 98 F (36.7 C)  TempSrc: Oral  Oral Oral  SpO2: 96%  93% 99%  Weight:  75.6 kg    Height:        Intake/Output Summary (Last 24 hours) at 01/14/2020 1851 Last data filed at 01/14/2020 1743 Gross per 24 hour  Intake 2608.08 ml  Output --  Net  2608.08 ml   Filed Weights   01/12/20 0500 01/13/20 0500 01/14/20 0536  Weight: 72.2 kg 73.7 kg 75.6 kg    Examination:  General exam: Appears calm and comfortable  Respiratory system: Clear to auscultation. Respiratory effort normal. Cardiovascular system: S1 & S2 heard, Regular Gastrointestinal system: Abdomen is nondistended, soft, pos BS Central nervous system: Alert and oriented. No focal neurological deficits. Extremities: Symmetric 5 x 5 power. Skin: No rashes, lesions  Psychiatry: Judgement and insight appear normal. Mood & affect appropriate.   Data  Reviewed: I have personally reviewed following labs and imaging studies  CBC: Recent Labs  Lab 01/10/20 0644 01/11/20 0644 01/12/20 0626 01/13/20 0558 01/14/20 0523  WBC 12.9* 9.8 9.6 8.1 9.3  NEUTROABS 11.5* 8.1* 7.8* 6.2 6.7  HGB 12.8 13.4 12.3 11.1* 11.0*  HCT 36.8 38.1 36.4 33.1* 33.3*  MCV 87.4 88.2 90.5 90.9 93.3  PLT 168 127* 165 161 468   Basic Metabolic Panel: Recent Labs  Lab 01/10/20 0644 01/10/20 0644 01/10/20 1611 01/11/20 0644 01/12/20 0626 01/13/20 0558 01/14/20 0523  NA 140  --   --  140 139 139 135  K 2.1*   < > 2.4* 3.2* 3.3* 2.6* 3.1*  CL 104  --   --  104 101 102 101  CO2 21*  --   --  24 24 23 25   GLUCOSE 97  --   --  77 90 84 113*  BUN 7*  --   --  9 6* 6* <5*  CREATININE 0.45  --   --  0.44 0.40* 0.44 0.36*  CALCIUM 9.0  --   --  9.3 9.0 8.8* 8.7*  MG 2.0   < > 1.9 2.0 2.0 1.9 1.6*  PHOS 1.2*   < > 2.5 1.1* 1.9* 2.6 2.1*   < > = values in this interval not displayed.   GFR: Estimated Creatinine Clearance: 67.6 mL/min (A) (by C-G formula based on SCr of 0.36 mg/dL (L)). Liver Function Tests: Recent Labs  Lab 01/10/20 0644 01/11/20 0644 01/12/20 0626 01/13/20 0558 01/14/20 0523  AST 26 24 22 19 17   ALT 26 22 22 18 17   ALKPHOS 78 74 73 65 64  BILITOT 1.5* 1.0 1.3* 1.5* 0.8  PROT 6.2* 6.2* 6.5 5.6* 5.8*  ALBUMIN 3.1* 3.1* 3.4* 2.8* 3.0*   Recent Labs  Lab 01/08/20 1449 01/09/20 0859 01/10/20 0644 01/11/20 0644 01/12/20 0626  LIPASE 1,210* 213* 75* 31 37   No results for input(s): AMMONIA in the last 168 hours. Coagulation Profile: Recent Labs  Lab 01/08/20 1449  INR 1.1   Cardiac Enzymes: No results for input(s): CKTOTAL, CKMB, CKMBINDEX, TROPONINI in the last 168 hours. BNP (last 3 results) No results for input(s): PROBNP in the last 8760 hours. HbA1C: No results for input(s): HGBA1C in the last 72 hours. CBG: Recent Labs  Lab 01/13/20 2359 01/14/20 0357 01/14/20 0756 01/14/20 1205 01/14/20 1725  GLUCAP 167*  132* 115* 171* 130*   Lipid Profile: No results for input(s): CHOL, HDL, LDLCALC, TRIG, CHOLHDL, LDLDIRECT in the last 72 hours. Thyroid Function Tests: No results for input(s): TSH, T4TOTAL, FREET4, T3FREE, THYROIDAB in the last 72 hours. Anemia Panel: No results for input(s): VITAMINB12, FOLATE, FERRITIN, TIBC, IRON, RETICCTPCT in the last 72 hours. Sepsis Labs: Recent Labs  Lab 01/08/20 1453 01/08/20 1726 01/08/20 1907 01/08/20 2341 01/09/20 0859 01/10/20 0644  PROCALCITON  --   --  <0.10  --  <0.10 <0.10  LATICACIDVEN 0.8 0.6 0.6  0.6  --   --     Recent Results (from the past 240 hour(s))  Blood culture (routine single)     Status: None   Collection Time: 01/08/20  2:49 PM   Specimen: BLOOD LEFT FOREARM  Result Value Ref Range Status   Specimen Description   Final    BLOOD LEFT FOREARM Performed at [facility] [address] [place], [place] [address]    Special Requests   Final    BOTTLES DRAWN AEROBIC AND ANAEROBIC Blood Culture adequate volume Performed at [facility] [address] [place], [place] [address]    Culture   Final    NO GROWTH 5 DAYS Performed at [facility], [place] [address], [place], [place] [address]    Report Status 01/13/2020 FINAL  Final  Respiratory Panel by RT PCR (Flu A&B, Covid) - Nasopharyngeal Swab     Status: None   Collection Time: 01/08/20  2:53 PM   Specimen: Nasopharyngeal Swab  Result Value Ref Range Status   SARS Coronavirus 2 by RT PCR NEGATIVE NEGATIVE Final    Comment: (NOTE) SARS-CoV-2 target nucleic acids are NOT DETECTED.  The SARS-CoV-2 RNA is generally detectable in upper respiratoy specimens during the acute phase of infection. The lowest concentration of SARS-CoV-2 viral copies this assay can detect is 131 copies/mL. A negative result does not preclude SARS-Cov-2 infection and should not be used as the sole basis for treatment or other patient management decisions. A  negative result may occur with  improper specimen collection/handling, submission of specimen other than nasopharyngeal swab, presence of viral mutation(s) within the areas targeted by this assay, and inadequate number of viral copies (<131 copies/mL). A negative result must be combined with clinical observations, patient history, and epidemiological information. The expected result is Negative.  Fact Sheet for Patients:  PinkCheek.be  Fact Sheet for Healthcare Providers:  GravelBags.it  This test is no t yet approved or cleared by the [place] FDA and  has been authorized for detection and/or diagnosis of SARS-CoV-2 by FDA under an Emergency Use Authorization (EUA). This EUA will remain  in effect (meaning this test can be used) for the duration of the COVID-19 declaration under Section 564(b)(1) of the Act, 21 U.S.C. section 360bbb-3(b)(1), unless the authorization is terminated or revoked sooner.     Influenza A by PCR NEGATIVE NEGATIVE Final   Influenza B by PCR NEGATIVE NEGATIVE Final    Comment: (NOTE) The Xpert Xpress SARS-CoV-2/FLU/RSV assay is intended as an aid in  the diagnosis of influenza from Nasopharyngeal swab specimens and  should not be used as a sole basis for treatment. Nasal washings and  aspirates are unacceptable for Xpert Xpress SARS-CoV-2/FLU/RSV  testing.  Fact Sheet for Patients: PinkCheek.be  Fact Sheet for Healthcare Providers: GravelBags.it  This test is not yet approved or cleared by the [place] FDA and  has been authorized for detection and/or diagnosis of SARS-CoV-2 by  FDA under an Emergency Use Authorization (EUA). This EUA will remain  in effect (meaning this test can be used) for the duration of the  Covid-19 declaration under Section 564(b)(1) of the Act, 21  U.S.C. section 360bbb-3(b)(1), unless the authorization  is  terminated or revoked. Performed at [facility], [place] [address] [place], [place] [address]   Culture, blood (single)     Status: None   Collection Time: 01/08/20  3:41 PM   Specimen: BLOOD  Result Value Ref Range  Status   Specimen Description   Final    BLOOD RIGHT ANTECUBITAL Performed at [facility] [address] [place], [place] [address]    Special Requests   Final    BOTTLES DRAWN AEROBIC AND ANAEROBIC Blood Culture results may not be optimal due to an excessive volume of blood received in culture bottles Performed at [facility] [address] [place], [place] [address]    Culture   Final    NO GROWTH 5 DAYS Performed at [facility], [place] [address] [place], [place] [address]    Report Status 01/13/2020 FINAL  Final  Urine culture     Status: None   Collection Time: 01/08/20  6:02 PM   Specimen: In/Out Cath Urine  Result Value Ref Range Status   Specimen Description   Final    IN/OUT CATH URINE Performed at [facility] [address] [place], [place] [address]    Special Requests   Final    NONE Performed at [facility], [place] [address], [place], [place] [address]    Culture   Final    NO GROWTH Performed at [facility], [place] [address] [place] [address], [place], [place] [address]    Report Status 01/09/2020 FINAL  Final     Radiology Studies: No results found.  Scheduled Meds: . benztropine  1 mg Oral Daily  . enoxaparin (LOVENOX) injection  40 mg Subcutaneous Q24H  . feeding supplement  1 Container Oral TID BM  . hydrALAZINE  7 mg Intravenous Q6H  . insulin aspart  0-9 Units Subcutaneous Q4H  . metoprolol tartrate  5 mg Intravenous Q8H  . nicotine  14 mg Transdermal Daily  . pantoprazole (PROTONIX) IV  40 mg Intravenous Q24H  . potassium chloride  40 mEq Oral BID  . sertraline  100 mg Oral Daily  . ziprasidone  80 mg Oral QHS   Continuous  Infusions:   LOS: 6 days   Marylu Lund, MD [facility] Pager On Amion  If 7PM-7AM, please contact night-coverage 01/14/2020, 6:51 PM

## 2020-01-15 DIAGNOSIS — K859 Acute pancreatitis without necrosis or infection, unspecified: Secondary | ICD-10-CM | POA: Diagnosis not present

## 2020-01-15 DIAGNOSIS — F17210 Nicotine dependence, cigarettes, uncomplicated: Secondary | ICD-10-CM | POA: Diagnosis not present

## 2020-01-15 LAB — COMPREHENSIVE METABOLIC PANEL
ALT: 17 U/L (ref 0–44)
AST: 18 U/L (ref 15–41)
Albumin: 3 g/dL — ABNORMAL LOW (ref 3.5–5.0)
Alkaline Phosphatase: 62 U/L (ref 38–126)
Anion gap: 8 (ref 5–15)
BUN: <5 mg/dL — ABNORMAL LOW (ref 8–23)
CO2: 26 mmol/L (ref 22–32)
Calcium: 8.8 mg/dL — ABNORMAL LOW (ref 8.9–10.3)
Chloride: 104 mmol/L (ref 98–111)
Creatinine, Ser: 0.43 mg/dL — ABNORMAL LOW (ref 0.44–1.00)
GFR calc non Af Amer: >60 mL/min (ref >60)
Glucose, Bld: 111 mg/dL — ABNORMAL HIGH (ref 70–99)
Potassium: 3.7 mmol/L (ref 3.5–5.1)
Sodium: 138 mmol/L (ref 135–145)
Total Bilirubin: 0.7 mg/dL (ref 0.3–1.2)
Total Protein: 5.9 g/dL — ABNORMAL LOW (ref 6.5–8.1)

## 2020-01-15 LAB — CBC WITH DIFFERENTIAL/PLATELET
Abs Immature Granulocytes: 0.14 10*3/uL — ABNORMAL HIGH (ref 0.00–0.07)
Basophils Absolute: 0.1 10*3/uL (ref 0.0–0.1)
Basophils Relative: 1 %
Eosinophils Absolute: 0.3 10*3/uL (ref 0.0–0.5)
Eosinophils Relative: 3 %
HCT: 33.3 % — ABNORMAL LOW (ref 36.0–46.0)
Hemoglobin: 10.9 g/dL — ABNORMAL LOW (ref 12.0–15.0)
Immature Granulocytes: 2 %
Lymphocytes Relative: 16 %
Lymphs Abs: 1.3 10*3/uL (ref 0.7–4.0)
MCH: 30.6 pg (ref 26.0–34.0)
MCHC: 32.7 g/dL (ref 30.0–36.0)
MCV: 93.5 fL (ref 80.0–100.0)
Monocytes Absolute: 0.9 10*3/uL (ref 0.1–1.0)
Monocytes Relative: 11 %
Neutro Abs: 5.4 10*3/uL (ref 1.7–7.7)
Neutrophils Relative %: 67 %
Platelets: 198 10*3/uL (ref 150–400)
RBC: 3.56 MIL/uL — ABNORMAL LOW (ref 3.87–5.11)
RDW: 17 % — ABNORMAL HIGH (ref 11.5–15.5)
WBC: 8 10*3/uL (ref 4.0–10.5)
nRBC: 0 % (ref 0.0–0.2)

## 2020-01-15 LAB — GLUCOSE, CAPILLARY
Glucose-Capillary: 112 mg/dL — ABNORMAL HIGH (ref 70–99)
Glucose-Capillary: 115 mg/dL — ABNORMAL HIGH (ref 70–99)
Glucose-Capillary: 139 mg/dL — ABNORMAL HIGH (ref 70–99)
Glucose-Capillary: 156 mg/dL — ABNORMAL HIGH (ref 70–99)
Glucose-Capillary: 157 mg/dL — ABNORMAL HIGH (ref 70–99)
Glucose-Capillary: 159 mg/dL — ABNORMAL HIGH (ref 70–99)
Glucose-Capillary: 97 mg/dL (ref 70–99)

## 2020-01-15 LAB — MAGNESIUM: Magnesium: 2.2 mg/dL (ref 1.7–2.4)

## 2020-01-15 LAB — PHOSPHORUS: Phosphorus: 2.8 mg/dL (ref 2.5–4.6)

## 2020-01-15 LAB — LIPASE, BLOOD: Lipase: 35 U/L (ref 11–51)

## 2020-01-15 MED ORDER — PANTOPRAZOLE SODIUM 40 MG PO TBEC: 40.0000 mg | DELAYED_RELEASE_TABLET | Freq: Every day | ORAL | Status: DC

## 2020-01-15 MED ORDER — LISINOPRIL 20 MG PO TABS
20.0000 mg | ORAL_TABLET | Freq: Every day | ORAL | Status: DC
  Administered 2020-01-15 – 2020-01-20 (×6): 20 mg via ORAL
  Filled 2020-01-15 (×6): qty 1

## 2020-01-15 MED ORDER — PANTOPRAZOLE SODIUM 40 MG PO TBEC
40.0000 mg | DELAYED_RELEASE_TABLET | Freq: Every day | ORAL | Status: DC
  Administered 2020-01-16 – 2020-01-20 (×5): 40 mg via ORAL
  Filled 2020-01-15 (×5): qty 1

## 2020-01-15 MED ORDER — TRAMADOL HCL 50 MG PO TABS
50.0000 mg | ORAL_TABLET | Freq: Four times a day (QID) | ORAL | Status: DC | PRN
  Administered 2020-01-15 – 2020-01-19 (×5): 50 mg via ORAL
  Filled 2020-01-15 (×5): qty 1

## 2020-01-15 MED ORDER — METOPROLOL TARTRATE 25 MG PO TABS: 12.5000 mg | ORAL_TABLET | Freq: Three times a day (TID) | ORAL | Status: DC

## 2020-01-15 MED ORDER — MORPHINE SULFATE (PF) 2 MG/ML IV SOLN
1.0000 mg | INTRAVENOUS | Status: DC | PRN
  Administered 2020-01-15 – 2020-01-20 (×11): 1 mg via INTRAVENOUS
  Filled 2020-01-15 (×12): qty 1

## 2020-01-15 NOTE — Care Management Important Message (Signed)
Important Message  Patient Details IM Letter given to the Patient Name: [name] MRN: [id number] Date of Birth: Mar 01, 1956   Medicare Important Message Given:  Yes     [name], [name] 01/15/2020, 10:52 AM

## 2020-01-15 NOTE — Progress Notes (Signed)
PROGRESS NOTE    [name]  VFI:[id number] DOB: February 19, 1956 DOA: 01/08/2020 PCP: Bartholome Bill, MD    Brief Narrative:  64 yo CF with PMH schizoaffective d/c, depression, IBS, prolapsed internal hemorrhoids grade 3, tubular adenoma of colon, diverticulosis who presented with abdominal pain. She underwent work-up and was found to have elevated lipase, 1210 and CT findings consistent with uncomplicated pancreatitis with peripancreatic stranding.  She had also not eaten approximately 6 days prior to admission and was n.p.o. for 4 days during hospitalization.  Due to difficulty tolerating trial of clear liquids on 01/12/2020, she underwent repeat CT abdomen/pelvis.  This was notable for showing improved pancreatitis and decreased peripancreatic edema.  No pancreatic necrosis appreciated  Assessment & Plan:   Principal Problem:   Acute pancreatitis Active Problems:   Schizoaffective disorder, depressive type (HCC)   Cigarette nicotine dependence without complication   Depression   Diverticulosis   HTN (hypertension)   Hypophosphatemia   Hypokalemia  Hypokalemia -improved to 3.7 -Repeat lytes in AM  Hypophosphatemia -recently replaced, currently within normal limits -Will repeat level in AM  HTN (hypertension) - continue current regimen -BP stable and controlled  Diverticulosis -without evidence of bleeding noted  Depression - seems stable at this time  Acute pancreatitis - sepsis ruled out after admission -Lipase initially 1210 with CT findings consistent with pancreatitis with peripancreatic edema -Lipase now trended down to normal with recent CT findings on 10/4 indicating much improved pancreatitis -Tolerated clears and full liquids. Unable to tolerate soft diet, citing abd pain requiring IV analgesia -Will repeat lipase in AM.   Cigarette nicotine dependence without complication - Continue nicotine patch   Schizoaffective disorder, depressive type  (HCC) - Benztropine 1 mg daily; zoloft 100 mg daily; Geodon 80 mgqhs -Affect seems normal    DVT prophylaxis: Lovenox subq Code Status: Full Family Communication: Pt in room, family not at bedside  Status is: Inpatient  Remains inpatient appropriate because:Ongoing active pain requiring inpatient pain management   Dispo: The patient is from: Home              Anticipated d/c is to: Home              Anticipated d/c date is: 1 day              Patient currently is not medically stable to d/c.  Consultants:     Procedures:     Antimicrobials: Anti-infectives (From admission, onward)   Start     Dose/Rate Route Frequency Ordered Stop   01/08/20 1600  meropenem (MERREM) 1 g in sodium chloride 0.9 % 100 mL IVPB  Status:  Discontinued        1 g 200 mL/hr over 30 Minutes Intravenous Every 8 hours 01/08/20 1540 01/09/20 1338      Subjective: Tolerating full liquid this AM, later complains of marked abd pain when trying soft diet  Objective: Vitals:   01/15/20 0500 01/15/20 0612 01/15/20 0613 01/15/20 1344  BP:  126/69  (!) 121/95  Pulse:  64  68  Resp:  18  18  Temp:  98 F (36.7 C)  98.3 F (36.8 C)  TempSrc:  Oral  Oral  SpO2:  93%  97%  Weight: 74.2 kg  75.1 kg   Height:        Intake/Output Summary (Last 24 hours) at 01/15/2020 1828 Last data filed at 01/15/2020 4166 Gross per 24 hour  Intake 240 ml  Output --  Net 240  ml   Filed Weights   01/14/20 0536 01/15/20 0500 01/15/20 0613  Weight: 75.6 kg 74.2 kg 75.1 kg    Examination: General exam: Conversant, in no acute distress Respiratory system: normal chest rise, clear, no audible wheezing Cardiovascular system: regular rhythm, s1-s2 Gastrointestinal system: nondistended, pos bs Central nervous system: No seizures, no tremors Extremities: No cyanosis, no joint deformities Skin: No rashes, no pallor Psychiatry: Affect normal // no auditory hallucinations   Data Reviewed: I have personally  reviewed following labs and imaging studies  CBC: Recent Labs  Lab 01/11/20 0644 01/12/20 0626 01/13/20 0558 01/14/20 0523 01/15/20 0508  WBC 9.8 9.6 8.1 9.3 8.0  NEUTROABS 8.1* 7.8* 6.2 6.7 5.4  HGB 13.4 12.3 11.1* 11.0* 10.9*  HCT 38.1 36.4 33.1* 33.3* 33.3*  MCV 88.2 90.5 90.9 93.3 93.5  PLT 127* 165 161 198 297   Basic Metabolic Panel: Recent Labs  Lab 01/11/20 0644 01/12/20 0626 01/13/20 0558 01/14/20 0523 01/15/20 0508  NA 140 139 139 135 138  K 3.2* 3.3* 2.6* 3.1* 3.7  CL 104 101 102 101 104  CO2 24 24 23 25 26   GLUCOSE 77 90 84 113* 111*  BUN 9 6* 6* <5* <5*  CREATININE 0.44 0.40* 0.44 0.36* 0.43*  CALCIUM 9.3 9.0 8.8* 8.7* 8.8*  MG 2.0 2.0 1.9 1.6* 2.2  PHOS 1.1* 1.9* 2.6 2.1* 2.8   GFR: Estimated Creatinine Clearance: 67.4 mL/min (A) (by C-G formula based on SCr of 0.43 mg/dL (L)). Liver Function Tests: Recent Labs  Lab 01/11/20 0644 01/12/20 0626 01/13/20 0558 01/14/20 0523 01/15/20 0508  AST 24 22 19 17 18   ALT 22 22 18 17 17   ALKPHOS 74 73 65 64 62  BILITOT 1.0 1.3* 1.5* 0.8 0.7  PROT 6.2* 6.5 5.6* 5.8* 5.9*  ALBUMIN 3.1* 3.4* 2.8* 3.0* 3.0*   Recent Labs  Lab 01/09/20 0859 01/10/20 0644 01/11/20 0644 01/12/20 0626 01/15/20 0508  LIPASE 213* 75* 31 37 35   No results for input(s): AMMONIA in the last 168 hours. Coagulation Profile: No results for input(s): INR, PROTIME in the last 168 hours. Cardiac Enzymes: No results for input(s): CKTOTAL, CKMB, CKMBINDEX, TROPONINI in the last 168 hours. BNP (last 3 results) No results for input(s): PROBNP in the last 8760 hours. HbA1C: No results for input(s): HGBA1C in the last 72 hours. CBG: Recent Labs  Lab 01/15/20 0005 01/15/20 0400 01/15/20 0803 01/15/20 1211 01/15/20 1652  GLUCAP 112* 97 159* 157* 156*   Lipid Profile: No results for input(s): CHOL, HDL, LDLCALC, TRIG, CHOLHDL, LDLDIRECT in the last 72 hours. Thyroid Function Tests: No results for input(s): TSH, T4TOTAL,  FREET4, T3FREE, THYROIDAB in the last 72 hours. Anemia Panel: No results for input(s): VITAMINB12, FOLATE, FERRITIN, TIBC, IRON, RETICCTPCT in the last 72 hours. Sepsis Labs: Recent Labs  Lab 01/08/20 1907 01/08/20 2341 01/09/20 0859 01/10/20 0644  PROCALCITON <0.10  --  <0.10 <0.10  LATICACIDVEN 0.6 0.6  --   --     Recent Results (from the past 240 hour(s))  Blood culture (routine single)     Status: None   Collection Time: 01/08/20  2:49 PM   Specimen: BLOOD LEFT FOREARM  Result Value Ref Range Status   Specimen Description   Final    BLOOD LEFT FOREARM Performed at [facility] [address] [place], [place] [address]    Special Requests   Final    BOTTLES DRAWN AEROBIC AND ANAEROBIC Blood Culture adequate volume Performed at  [facility], [place] [address], [place], [place] [address]    Culture   Final    NO GROWTH 5 DAYS Performed at [facility], [place] [address], [place], [place] [address]    Report Status 01/13/2020 FINAL  Final  Respiratory Panel by RT PCR (Flu A&B, Covid) - Nasopharyngeal Swab     Status: None   Collection Time: 01/08/20  2:53 PM   Specimen: Nasopharyngeal Swab  Result Value Ref Range Status   SARS Coronavirus 2 by RT PCR NEGATIVE NEGATIVE Final    Comment: (NOTE) SARS-CoV-2 target nucleic acids are NOT DETECTED.  The SARS-CoV-2 RNA is generally detectable in upper respiratoy specimens during the acute phase of infection. The lowest concentration of SARS-CoV-2 viral copies this assay can detect is 131 copies/mL. A negative result does not preclude SARS-Cov-2 infection and should not be used as the sole basis for treatment or other patient management decisions. A negative result may occur with  improper specimen collection/handling, submission of specimen other than nasopharyngeal swab, presence of viral mutation(s) within the areas targeted by this assay, and inadequate number of viral copies (<131  copies/mL). A negative result must be combined with clinical observations, patient history, and epidemiological information. The expected result is Negative.  Fact Sheet for Patients:  PinkCheek.be  Fact Sheet for Healthcare Providers:  GravelBags.it  This test is no t yet approved or cleared by the [place] FDA and  has been authorized for detection and/or diagnosis of SARS-CoV-2 by FDA under an Emergency Use Authorization (EUA). This EUA will remain  in effect (meaning this test can be used) for the duration of the COVID-19 declaration under Section 564(b)(1) of the Act, 21 U.S.C. section 360bbb-3(b)(1), unless the authorization is terminated or revoked sooner.     Influenza A by PCR NEGATIVE NEGATIVE Final   Influenza B by PCR NEGATIVE NEGATIVE Final    Comment: (NOTE) The Xpert Xpress SARS-CoV-2/FLU/RSV assay is intended as an aid in  the diagnosis of influenza from Nasopharyngeal swab specimens and  should not be used as a sole basis for treatment. Nasal washings and  aspirates are unacceptable for Xpert Xpress SARS-CoV-2/FLU/RSV  testing.  Fact Sheet for Patients: PinkCheek.be  Fact Sheet for Healthcare Providers: GravelBags.it  This test is not yet approved or cleared by the [place] FDA and  has been authorized for detection and/or diagnosis of SARS-CoV-2 by  FDA under an Emergency Use Authorization (EUA). This EUA will remain  in effect (meaning this test can be used) for the duration of the  Covid-19 declaration under Section 564(b)(1) of the Act, 21  U.S.C. section 360bbb-3(b)(1), unless the authorization is  terminated or revoked. Performed at [facility], [place] [address], [place], [place] [address]   Culture, blood (single)     Status: None   Collection Time: 01/08/20  3:41 PM   Specimen: BLOOD  Result Value Ref  Range Status   Specimen Description   Final    BLOOD RIGHT ANTECUBITAL Performed at [facility] [address] [place], [place] [address]    Special Requests   Final    BOTTLES DRAWN AEROBIC AND ANAEROBIC Blood Culture results may not be optimal due to an excessive volume of blood received in culture bottles Performed at [facility] [address], [place], [place] [address]    Culture   Final    NO GROWTH 5 DAYS Performed at [facility], [place]  [address] [place], [place] [address]    Report Status 01/13/2020 FINAL  Final  Urine culture     Status: None   Collection Time: 01/08/20  6:02 PM   Specimen: In/Out Cath Urine  Result Value Ref Range Status   Specimen Description   Final    IN/OUT CATH URINE Performed at [facility] [address], [place], [place] [address]    Special Requests   Final    NONE Performed at [facility], [place] [address], [place], [place] [address]    Culture   Final    NO GROWTH Performed at [facility], [place] [address], [place], [place] [address]    Report Status 01/09/2020 FINAL  Final     Radiology Studies: No results found.  Scheduled Meds: . benztropine  1 mg Oral Daily  . enoxaparin (LOVENOX) injection  40 mg Subcutaneous Q24H  . feeding supplement  1 Container Oral TID BM  . insulin aspart  0-9 Units Subcutaneous Q4H  . lisinopril  20 mg Oral Daily  . nicotine  14 mg Transdermal Daily  . [START ON 01/16/2020] pantoprazole  40 mg Oral Daily  . sertraline  100 mg Oral Daily  . ziprasidone  80 mg Oral QHS   Continuous Infusions:   LOS: 7 days   Marylu Lund, MD [facility] Pager On Amion  If 7PM-7AM, please contact night-coverage 01/15/2020, 6:28 PM

## 2020-01-15 NOTE — Progress Notes (Signed)
Pt unable to tolerate lunch. After eating two bites, she experienced abdominal pain and nausea.

## 2020-01-16 DIAGNOSIS — F17210 Nicotine dependence, cigarettes, uncomplicated: Secondary | ICD-10-CM | POA: Diagnosis not present

## 2020-01-16 DIAGNOSIS — F320 Major depressive disorder, single episode, mild: Secondary | ICD-10-CM | POA: Diagnosis not present

## 2020-01-16 LAB — CBC WITH DIFFERENTIAL/PLATELET
Abs Immature Granulocytes: 0.09 10*3/uL — ABNORMAL HIGH (ref 0.00–0.07)
Basophils Absolute: 0 10*3/uL (ref 0.0–0.1)
Basophils Relative: 0 %
Eosinophils Absolute: 0.2 10*3/uL (ref 0.0–0.5)
Eosinophils Relative: 3 %
HCT: 33.9 % — ABNORMAL LOW (ref 36.0–46.0)
Hemoglobin: 10.7 g/dL — ABNORMAL LOW (ref 12.0–15.0)
Immature Granulocytes: 1 %
Lymphocytes Relative: 18 %
Lymphs Abs: 1.4 10*3/uL (ref 0.7–4.0)
MCH: 30.7 pg (ref 26.0–34.0)
MCHC: 31.6 g/dL (ref 30.0–36.0)
MCV: 97.4 fL (ref 80.0–100.0)
Monocytes Absolute: 1.3 10*3/uL — ABNORMAL HIGH (ref 0.1–1.0)
Monocytes Relative: 17 %
Neutro Abs: 4.7 10*3/uL (ref 1.7–7.7)
Neutrophils Relative %: 61 %
Platelets: 211 10*3/uL (ref 150–400)
RBC: 3.48 MIL/uL — ABNORMAL LOW (ref 3.87–5.11)
RDW: 17.2 % — ABNORMAL HIGH (ref 11.5–15.5)
WBC: 7.8 10*3/uL (ref 4.0–10.5)
nRBC: 0 % (ref 0.0–0.2)

## 2020-01-16 LAB — PHOSPHORUS: Phosphorus: 3.1 mg/dL (ref 2.5–4.6)

## 2020-01-16 LAB — LIPID PANEL
Cholesterol: 111 mg/dL (ref 0–200)
HDL: 31 mg/dL — ABNORMAL LOW (ref >40)
LDL Cholesterol: 61 mg/dL (ref 0–99)
Total CHOL/HDL Ratio: 3.6 RATIO
Triglycerides: 95 mg/dL (ref <150)
VLDL: 19 mg/dL (ref 0–40)

## 2020-01-16 LAB — COMPREHENSIVE METABOLIC PANEL
ALT: 16 U/L (ref 0–44)
AST: 20 U/L (ref 15–41)
Albumin: 2.9 g/dL — ABNORMAL LOW (ref 3.5–5.0)
Alkaline Phosphatase: 60 U/L (ref 38–126)
Anion gap: 8 (ref 5–15)
BUN: <5 mg/dL — ABNORMAL LOW (ref 8–23)
CO2: 23 mmol/L (ref 22–32)
Calcium: 8.8 mg/dL — ABNORMAL LOW (ref 8.9–10.3)
Chloride: 106 mmol/L (ref 98–111)
Creatinine, Ser: 0.46 mg/dL (ref 0.44–1.00)
GFR calc non Af Amer: >60 mL/min (ref >60)
Glucose, Bld: 88 mg/dL (ref 70–99)
Potassium: 3.6 mmol/L (ref 3.5–5.1)
Sodium: 137 mmol/L (ref 135–145)
Total Bilirubin: 0.6 mg/dL (ref 0.3–1.2)
Total Protein: 5.8 g/dL — ABNORMAL LOW (ref 6.5–8.1)

## 2020-01-16 LAB — MAGNESIUM: Magnesium: 2.1 mg/dL (ref 1.7–2.4)

## 2020-01-16 LAB — GLUCOSE, CAPILLARY
Glucose-Capillary: 133 mg/dL — ABNORMAL HIGH (ref 70–99)
Glucose-Capillary: 96 mg/dL (ref 70–99)
Glucose-Capillary: 127 mg/dL — ABNORMAL HIGH (ref 70–99)
Glucose-Capillary: 155 mg/dL — ABNORMAL HIGH (ref 70–99)
Glucose-Capillary: 131 mg/dL — ABNORMAL HIGH (ref 70–99)

## 2020-01-16 LAB — LIPASE, BLOOD: Lipase: 29 U/L (ref 11–51)

## 2020-01-16 MED ORDER — SODIUM CHLORIDE 0.9 % IV SOLN: INTRAVENOUS | Status: DC

## 2020-01-16 NOTE — Consult Note (Signed)
Referring Provider: Dr. Wyline Copas Primary Care Physician:  Bartholome Bill, MD Primary Gastroenterologist:  Althia Forts (followed by [facility])  Reason for Consultation:  Acute pancreatitis  HPI: [name] is a 64 y.o. female with history of schizoaffective disorder, Barrett's esophagus (as of 04/2017), reflux esophagitis, remote cholecystectomy, and gastroparesis presenting for consultation of pancreatitis.  Patient presented to the ED on 9/30 with abdominal pain and was found to have acute uncomplicated pancreatitis per CT imaging.  Lipase on admission was 1210.  Patient has reported persistent nausea and abdominal pain.  Repeat CT 01/12/2020 showed improved changes of acute pancreatitis with significantly decreased peripancreatic edema.  Lipase today is within normal limits (29).    Patient was improving but was unable to tolerate a soft diet and experienced worsening pain and vomiting.  Patient denies any prior history of pancreatitis.  Denies any alcohol use, stating she last drank alcohol over 1 year ago.  Had cholecystectomy in the 90s.  Reports recent constipation, though has had some softer bowel movements with bowel regimen while in the hospital.  No melena or hematochezia.  Past Medical History:  Diagnosis Date  . Depression   . Diverticulosis   . GERD (gastroesophageal reflux disease)   . IBS (irritable bowel syndrome)   . Internal hemorrhoid    2nd degree  . Myocarditis ([place]) 2009  . Osteoarthritis   . Prolapsed internal hemorrhoids, grade 3 12/01/2015  . Schizoaffective disorder ([place])   . Tubular adenoma of colon 08/2011    Past Surgical History:  Procedure Laterality Date  . BREAST BIOPSY Right 12/27/2012  . CHOLECYSTECTOMY    . HEMORRHOID BANDING      Prior to Admission medications   Medication Sig Start Date End Date Taking? Authorizing Provider  benztropine (COGENTIN) 1 MG tablet Take 1 tablet (1 mg total) by mouth daily. 11/06/19  Yes [name],  MD  clobetasol (TEMOVATE) 0.05 % external solution Apply 1 application topically 2 (two) times daily.  12/13/19  Yes [provider]  cyclobenzaprine (FLEXERIL) 5 MG tablet Take 5 mg by mouth 3 (three) times daily as needed for muscle spasms.  10/27/19  Yes [provider]  Fluocinolone Acetonide Scalp 0.01 % OIL Apply 1 application topically at bedtime.  12/09/19  Yes [provider]  fluocinonide cream (LIDEX) 0.05 % Apply topically 3 (three) times daily. 11/03/19  Yes [provider]  meloxicam (MOBIC) 15 MG tablet Take 15 mg by mouth daily as needed for pain.  10/23/16  Yes Bartholome Bill, MD  omeprazole (PRILOSEC) 40 MG capsule take 1 capsule by mouth every morning Patient taking differently: Take 40 mg by mouth daily.  02/17/16  Yes Ladene Artist, MD  ondansetron (ZOFRAN) 4 MG tablet Take 4 mg by mouth every 8 (eight) hours as needed for nausea or vomiting.  09/25/19  Yes [provider]  sertraline (ZOLOFT) 100 MG tablet Take 2 tablets (200 mg total) by mouth daily. Total daily dose= 250mg  Patient taking differently: Take 100 mg by mouth daily.  11/06/19  Yes [name], MD  sertraline (ZOLOFT) 50 MG tablet Take 1 tablet (50 mg total) by mouth daily. Total daily dose is 250mg  po qHS. Patient taking differently: Take 50 mg by mouth at bedtime.  11/06/19 11/05/20 Yes [name], MD  traMADol (ULTRAM) 50 MG tablet Take 50 mg by mouth every 12 (twelve) hours as needed for moderate pain.    Yes [provider]  traZODone (DESYREL) 100 MG  tablet Take 1 tablet by mouth at bedtime to equal 150mg  total Patient taking differently: Take 100 mg by mouth at bedtime.  12/01/19  Yes [name], MD  traZODone (DESYREL) 50 MG tablet Take 1 tablet (50 mg total) by mouth at bedtime. Total dose= 350mg  qHS Patient taking differently: Take 50 mg by mouth at bedtime.  11/06/19  Yes [name], MD  Vitamin D, Ergocalciferol, (DRISDOL) 1.25 MG  (50000 UNIT) CAPS capsule Take 50,000 Units by mouth once a week. 11/21/19  Yes [provider]  ziprasidone (GEODON) 80 MG capsule Take 1 capsule (80 mg total) by mouth 2 (two) times daily with a meal. Patient taking differently: Take 80 mg by mouth at bedtime.  11/06/19  Yes [name], MD  ondansetron (ZOFRAN-ODT) 4 MG disintegrating tablet Take 1 tablet (4 mg total) by mouth every 8 (eight) hours as needed for nausea or vomiting (and diarrhea). Patient not taking: Reported on 01/08/2020 01/14/16   Gatha Mayer, MD  sertraline (ZOLOFT) 100 MG tablet Take 1 tablet (100 mg total) by mouth daily. 01/15/20   Donne Hazel, MD  traZODone (DESYREL) 50 MG tablet Take 0.5 tablets (25 mg total) by mouth at bedtime as needed for sleep. 01/14/20   Donne Hazel, MD  ziprasidone (GEODON) 80 MG capsule Take 1 capsule (80 mg total) by mouth at bedtime. 01/14/20   Donne Hazel, MD    Scheduled Meds: . benztropine  1 mg Oral Daily  . enoxaparin (LOVENOX) injection  40 mg Subcutaneous Q24H  . feeding supplement  1 Container Oral TID BM  . insulin aspart  0-9 Units Subcutaneous Q4H  . lisinopril  20 mg Oral Daily  . nicotine  14 mg Transdermal Daily  . pantoprazole  40 mg Oral Daily  . sertraline  100 mg Oral Daily  . ziprasidone  80 mg Oral QHS   Continuous Infusions: . sodium chloride     PRN Meds:.lip balm, morphine injection, ondansetron (ZOFRAN) IV, polyethylene glycol, traMADol, traZODone  Allergies as of 01/08/2020 - Review Complete 01/08/2020  Allergen Reaction Noted  . Penicillins  12/09/2013  . Sulfa antibiotics Rash 11/04/2013    Family History  Problem Relation Age of Onset  . Suicidality Father   . Depression Father   . Suicidality Sister   . Depression Sister   . Suicidality Cousin   . Suicidality Other     Social History   Socioeconomic History  . Marital status: Divorced    Spouse name: Not on file  . Number of children: 2  . Years of education: Not  on file  . Highest education level: Not on file  Occupational History  . Occupation: Disabled  Tobacco Use  . Smoking status: Former Smoker    Packs/day: 1.00    Years: 38.00    Pack years: 38.00    Types: Cigarettes    Quit date: 05/06/2018    Years since quitting: 1.6  . Smokeless tobacco: Never Used  Vaping Use  . Vaping Use: Never used  Substance and Sexual Activity  . Alcohol use: No    Alcohol/week: 0.0 standard drinks  . Drug use: No  . Sexual activity: Not Currently  Other Topics Concern  . Not on file  Social History Narrative  . Not on file   Social Determinants of Health   Financial Resource Strain:   . Difficulty of Paying Living Expenses: Not on file  Food Insecurity:   . Worried About [employer] of  Food in the Last Year: Not on file  . Ran Out of Food in the Last Year: Not on file  Transportation Needs:   . Lack of Transportation (Medical): Not on file  . Lack of Transportation (Non-Medical): Not on file  Physical Activity:   . Days of Exercise per Week: Not on file  . Minutes of Exercise per Session: Not on file  Stress:   . Feeling of Stress : Not on file  Social Connections:   . Frequency of Communication with Friends and Family: Not on file  . Frequency of Social Gatherings with Friends and Family: Not on file  . Attends Religious Services: Not on file  . Active Member of Clubs or Organizations: Not on file  . Attends Archivist Meetings: Not on file  . Marital Status: Not on file  Intimate Partner Violence:   . Fear of Current or Ex-Partner: Not on file  . Emotionally Abused: Not on file  . Physically Abused: Not on file  . Sexually Abused: Not on file    Review of Systems:Review of Systems  Constitutional: Negative for chills and fever.  HENT: Negative for hearing loss and tinnitus.   Eyes: Negative for pain and redness.  Respiratory: Negative for cough and shortness of breath.   Cardiovascular: Negative for chest pain and  palpitations.  Gastrointestinal: Positive for abdominal pain, constipation, heartburn, nausea and vomiting. Negative for blood in stool, diarrhea and melena.  Genitourinary: Negative for flank pain and hematuria.  Musculoskeletal: Negative for falls and joint pain.  Skin: Negative for itching and rash.  Neurological: Negative for seizures and loss of consciousness.  Endo/Heme/Allergies: Negative for polydipsia. Does not bruise/bleed easily.  Psychiatric/Behavioral: Negative for substance abuse. The patient is not nervous/anxious.      Physical Exam: Vital signs: Vitals:   01/16/20 0548 01/16/20 1436  BP: 124/61 126/65  Pulse: 61 68  Resp: 16 (!) 24  Temp: 98.2 F (36.8 C) 97.7 F (36.5 C)  SpO2: 91% 97%   Last BM Date: 01/15/20 Physical Exam Vitals reviewed.  Constitutional:      General: She is not in acute distress. HENT:     Head: Normocephalic and atraumatic.     Nose: Nose normal.     Mouth/Throat:     Mouth: Mucous membranes are moist.     Pharynx: Oropharynx is clear.  Eyes:     General: No scleral icterus.    Extraocular Movements: Extraocular movements intact.     Conjunctiva/sclera: Conjunctivae normal.  Cardiovascular:     Rate and Rhythm: Normal rate and regular rhythm.     Pulses: Normal pulses.     Heart sounds: Normal heart sounds.  Pulmonary:     Effort: Pulmonary effort is normal. No respiratory distress.     Breath sounds: Normal breath sounds.  Abdominal:     General: Bowel sounds are normal. There is no distension.     Palpations: Abdomen is soft. There is no mass.     Tenderness: There is abdominal tenderness (mild, epigastric). There is no guarding or rebound.     Hernia: No hernia is present.  Musculoskeletal:        General: No swelling or tenderness.     Cervical back: Normal range of motion and neck supple.  Skin:    General: Skin is warm and dry.  Neurological:     General: No focal deficit present.     Mental Status: She is oriented  to person, place, and  time. She is lethargic.  Psychiatric:        Mood and Affect: Mood and affect normal.        Behavior: Behavior is cooperative.    GI:  Lab Results: Recent Labs    01/14/20 0523 01/15/20 0508 01/16/20 0523  WBC 9.3 8.0 7.8  HGB 11.0* 10.9* 10.7*  HCT 33.3* 33.3* 33.9*  PLT 198 198 211   BMET Recent Labs    01/14/20 0523 01/15/20 0508 01/16/20 0523  NA 135 138 137  K 3.1* 3.7 3.6  CL 101 104 106  CO2 25 26 23   GLUCOSE 113* 111* 88  BUN <5* <5* <5*  CREATININE 0.36* 0.43* 0.46  CALCIUM 8.7* 8.8* 8.8*   LFT Recent Labs    01/16/20 0523  PROT 5.8*  ALBUMIN 2.9*  AST 20  ALT 16  ALKPHOS 60  BILITOT 0.6   PT/INR No results for input(s): LABPROT, INR in the last 72 hours.   Studies/Results: No results found.  Impression/Plan: Acute pancreatitis: Appears to be idiopathic.  Overall, seems to be improving clinically, though patient was unable to tolerate transition to soft diet.  Recommend reinitiation of IV fluids at rate of 150 cc/h.  Clear liquid diet today.  We will reassess tomorrow.  [facility] will follow.   LOS: 8 days   Salley Slaughter  PA-C 01/16/2020, 4:02 PM  Contact #  [phone]

## 2020-01-16 NOTE — Progress Notes (Signed)
PROGRESS NOTE    [name]  IOM:[id number] DOB: [birth year] DOA: 01/08/2020 PCP: Bartholome Bill, MD    Brief Narrative:  64 yo CF with PMH schizoaffective d/c, depression, IBS, prolapsed internal hemorrhoids grade 3, tubular adenoma of colon, diverticulosis who presented with abdominal pain. She underwent work-up and was found to have elevated lipase, 1210 and CT findings consistent with uncomplicated pancreatitis with peripancreatic stranding.  She had also not eaten approximately 6 days prior to admission and was n.p.o. for 4 days during hospitalization.  Due to difficulty tolerating trial of clear liquids on 01/12/2020, she underwent repeat CT abdomen/pelvis.  This was notable for showing improved pancreatitis and decreased peripancreatic edema.  No pancreatic necrosis appreciated  Assessment & Plan:   Principal Problem:   Acute pancreatitis Active Problems:   Schizoaffective disorder, depressive type (HCC)   Cigarette nicotine dependence without complication   Depression   Diverticulosis   HTN (hypertension)   Hypophosphatemia   Hypokalemia  Hypokalemia -Stable at 3.6 -Repeat lytes in AM  Hypophosphatemia -recently replaced, currently remains within normal limits -Will repeat level in AM  HTN (hypertension) - continue current regimen -BP stable and controlled at this time  Diverticulosis -without evidence of bleeding noted  Depression - seems stable currently  Acute pancreatitis - sepsis ruled out after admission -Lipase initially 1210 with CT findings consistent with pancreatitis with peripancreatic edema -Lipase now trended down to normal with recent CT findings on 10/4 indicating much improved pancreatitis -recently tolerated clears and full liquids, however later unable to tolerate soft diet, citing abd pain requiring more IV analgesia -Appreciate input by GI. Recommendation to continue de-escalated diet of clears with aggressive IVF hydration at  150cc/hr -Plan to advance diet more slowly as pt progresses  Cigarette nicotine dependence without complication - Continue nicotine patch   Schizoaffective disorder, depressive type (HCC) - Benztropine 1 mg daily; zoloft 100 mg daily; Geodon 80 mgqhs -Affect seems normal at this time   DVT prophylaxis: Lovenox subq Code Status: Full Family Communication: Pt in room, family not at bedside  Status is: Inpatient  Remains inpatient appropriate because:Ongoing active pain requiring inpatient pain management   Dispo: The patient is from: Home              Anticipated d/c is to: Home              Anticipated d/c date is: 3 days              Patient currently is not medically stable to d/c.  Consultants:   [facility]  Procedures:     Antimicrobials: Anti-infectives (From admission, onward)   Start     Dose/Rate Route Frequency Ordered Stop   01/08/20 1600  meropenem (MERREM) 1 g in sodium chloride 0.9 % 100 mL IVPB  Status:  Discontinued        1 g 200 mL/hr over 30 Minutes Intravenous Every 8 hours 01/08/20 1540 01/09/20 1338      Subjective: Today reporting increased abd pain and intolerance even to minimal liquid intake  Objective: Vitals:   01/15/20 2112 01/16/20 0500 01/16/20 0548 01/16/20 1436  BP: (!) 152/71  124/61 126/65  Pulse: 66  61 68  Resp: 18  16 (!) 24  Temp: 98 F (36.7 C)  98.2 F (36.8 C) 97.7 F (36.5 C)  TempSrc:    Oral  SpO2: 99%  91% 97%  Weight:  73 kg    Height:  No intake or output data in the 24 hours ending 01/16/20 1717 Filed Weights   01/15/20 0500 01/15/20 0613 01/16/20 0500  Weight: 74.2 kg 75.1 kg 73 kg    Examination: General exam: Awake, laying in bed, appears uncomfortalbe Respiratory system: Normal respiratory effort, no wheezing Cardiovascular system: regular rate, s1, s2 Gastrointestinal system: Soft, pos bs, tenderness over epigastric region on palpation Central nervous system: CN2-12 grossly intact,  strength intact Extremities: Perfused, no clubbing Skin: Normal skin turgor, no notable skin lesions seen Psychiatry: Mood normal // no visual hallucinations   Data Reviewed: I have personally reviewed following labs and imaging studies  CBC: Recent Labs  Lab 01/12/20 0626 01/13/20 0558 01/14/20 0523 01/15/20 0508 01/16/20 0523  WBC 9.6 8.1 9.3 8.0 7.8  NEUTROABS 7.8* 6.2 6.7 5.4 4.7  HGB 12.3 11.1* 11.0* 10.9* 10.7*  HCT 36.4 33.1* 33.3* 33.3* 33.9*  MCV 90.5 90.9 93.3 93.5 97.4  PLT 165 161 198 198 973   Basic Metabolic Panel: Recent Labs  Lab 01/12/20 0626 01/13/20 0558 01/14/20 0523 01/15/20 0508 01/16/20 0523  NA 139 139 135 138 137  K 3.3* 2.6* 3.1* 3.7 3.6  CL 101 102 101 104 106  CO2 24 23 25 26 23   GLUCOSE 90 84 113* 111* 88  BUN 6* 6* <5* <5* <5*  CREATININE 0.40* 0.44 0.36* 0.43* 0.46  CALCIUM 9.0 8.8* 8.7* 8.8* 8.8*  MG 2.0 1.9 1.6* 2.2 2.1  PHOS 1.9* 2.6 2.1* 2.8 3.1   GFR: Estimated Creatinine Clearance: 66.5 mL/min (by C-G formula based on SCr of 0.46 mg/dL). Liver Function Tests: Recent Labs  Lab 01/12/20 0626 01/13/20 0558 01/14/20 0523 01/15/20 0508 01/16/20 0523  AST 22 19 17 18 20   ALT 22 18 17 17 16   ALKPHOS 73 65 64 62 60  BILITOT 1.3* 1.5* 0.8 0.7 0.6  PROT 6.5 5.6* 5.8* 5.9* 5.8*  ALBUMIN 3.4* 2.8* 3.0* 3.0* 2.9*   Recent Labs  Lab 01/10/20 0644 01/11/20 0644 01/12/20 0626 01/15/20 0508 01/16/20 0523  LIPASE 75* 31 37 35 29   No results for input(s): AMMONIA in the last 168 hours. Coagulation Profile: No results for input(s): INR, PROTIME in the last 168 hours. Cardiac Enzymes: No results for input(s): CKTOTAL, CKMB, CKMBINDEX, TROPONINI in the last 168 hours. BNP (last 3 results) No results for input(s): PROBNP in the last 8760 hours. HbA1C: No results for input(s): HGBA1C in the last 72 hours. CBG: Recent Labs  Lab 01/15/20 1951 01/15/20 2300 01/16/20 0332 01/16/20 0733 01/16/20 1139  GLUCAP 139* 115* 133*  96 127*   Lipid Profile: Recent Labs    01/16/20 1140  CHOL 111  HDL 31*  LDLCALC 61  TRIG 95  CHOLHDL 3.6   Thyroid Function Tests: No results for input(s): TSH, T4TOTAL, FREET4, T3FREE, THYROIDAB in the last 72 hours. Anemia Panel: No results for input(s): VITAMINB12, FOLATE, FERRITIN, TIBC, IRON, RETICCTPCT in the last 72 hours. Sepsis Labs: Recent Labs  Lab 01/10/20 0644  PROCALCITON <0.10    Recent Results (from the past 240 hour(s))  Blood culture (routine single)     Status: None   Collection Time: 01/08/20  2:49 PM   Specimen: BLOOD LEFT FOREARM  Result Value Ref Range Status   Specimen Description   Final    BLOOD LEFT FOREARM Performed at [facility], [place] [address], [place], [place] [address]    Special Requests   Final    BOTTLES DRAWN AEROBIC AND ANAEROBIC Blood Culture  adequate volume Performed at [facility] [address], [place], [place] [address]    Culture   Final    NO GROWTH 5 DAYS Performed at [facility], [place] [address], [place], [place] [address]    Report Status 01/13/2020 FINAL  Final  Respiratory Panel by RT PCR (Flu A&B, Covid) - Nasopharyngeal Swab     Status: None   Collection Time: 01/08/20  2:53 PM   Specimen: Nasopharyngeal Swab  Result Value Ref Range Status   SARS Coronavirus 2 by RT PCR NEGATIVE NEGATIVE Final    Comment: (NOTE) SARS-CoV-2 target nucleic acids are NOT DETECTED.  The SARS-CoV-2 RNA is generally detectable in upper respiratoy specimens during the acute phase of infection. The lowest concentration of SARS-CoV-2 viral copies this assay can detect is 131 copies/mL. A negative result does not preclude SARS-Cov-2 infection and should not be used as the sole basis for treatment or other patient management decisions. A negative result may occur with  improper specimen collection/handling, submission of specimen other than nasopharyngeal swab, presence of viral  mutation(s) within the areas targeted by this assay, and inadequate number of viral copies (<131 copies/mL). A negative result must be combined with clinical observations, patient history, and epidemiological information. The expected result is Negative.  Fact Sheet for Patients:  PinkCheek.be  Fact Sheet for Healthcare Providers:  GravelBags.it  This test is no t yet approved or cleared by the [place] FDA and  has been authorized for detection and/or diagnosis of SARS-CoV-2 by FDA under an Emergency Use Authorization (EUA). This EUA will remain  in effect (meaning this test can be used) for the duration of the COVID-19 declaration under Section 564(b)(1) of the Act, 21 U.S.C. section 360bbb-3(b)(1), unless the authorization is terminated or revoked sooner.     Influenza A by PCR NEGATIVE NEGATIVE Final   Influenza B by PCR NEGATIVE NEGATIVE Final    Comment: (NOTE) The Xpert Xpress SARS-CoV-2/FLU/RSV assay is intended as an aid in  the diagnosis of influenza from Nasopharyngeal swab specimens and  should not be used as a sole basis for treatment. Nasal washings and  aspirates are unacceptable for Xpert Xpress SARS-CoV-2/FLU/RSV  testing.  Fact Sheet for Patients: PinkCheek.be  Fact Sheet for Healthcare Providers: GravelBags.it  This test is not yet approved or cleared by the [place] FDA and  has been authorized for detection and/or diagnosis of SARS-CoV-2 by  FDA under an Emergency Use Authorization (EUA). This EUA will remain  in effect (meaning this test can be used) for the duration of the  Covid-19 declaration under Section 564(b)(1) of the Act, 21  U.S.C. section 360bbb-3(b)(1), unless the authorization is  terminated or revoked. Performed at [facility], [place] [address] [place], [place] [address]   Culture, blood  (single)     Status: None   Collection Time: 01/08/20  3:41 PM   Specimen: BLOOD  Result Value Ref Range Status   Specimen Description   Final    BLOOD RIGHT ANTECUBITAL Performed at [facility] [address] [place], [place] [address]    Special Requests   Final    BOTTLES DRAWN AEROBIC AND ANAEROBIC Blood Culture results may not be optimal due to an excessive volume of blood received in culture bottles Performed at [facility] [address] [place], [place] [address]    Culture   Final    NO GROWTH 5 DAYS Performed at [facility], [place] [address], [place], [place] [address]    Report Status 01/13/2020 FINAL  Final  Urine culture     Status: None   Collection Time: 01/08/20  6:02 PM   Specimen: In/Out Cath Urine  Result Value Ref Range Status   Specimen Description   Final    IN/OUT CATH URINE Performed at [facility] [address] [place], [place] [address]    Special Requests   Final    NONE Performed at [facility], [place] [address] [place], [place] [address]    Culture   Final    NO GROWTH Performed at [facility], [place] [address], [place], [place] [address]    Report Status 01/09/2020 FINAL  Final     Radiology Studies: No results found.  Scheduled Meds: . benztropine  1 mg Oral Daily  . enoxaparin (LOVENOX) injection  40 mg Subcutaneous Q24H  . feeding supplement  1 Container Oral TID BM  . insulin aspart  0-9 Units Subcutaneous Q4H  . lisinopril  20 mg Oral Daily  . nicotine  14 mg Transdermal Daily  . pantoprazole  40 mg Oral Daily  . sertraline  100 mg Oral Daily  . ziprasidone  80 mg Oral QHS   Continuous Infusions: . sodium chloride 150 mL/hr at 01/16/20 1618     LOS: 8 days   Marylu Lund, MD [facility] Pager On Amion  If 7PM-7AM, please contact night-coverage 01/16/2020, 5:17 PM

## 2020-01-17 DIAGNOSIS — F320 Major depressive disorder, single episode, mild: Secondary | ICD-10-CM | POA: Diagnosis not present

## 2020-01-17 DIAGNOSIS — F17210 Nicotine dependence, cigarettes, uncomplicated: Secondary | ICD-10-CM | POA: Diagnosis not present

## 2020-01-17 LAB — GLUCOSE, CAPILLARY
Glucose-Capillary: 118 mg/dL — ABNORMAL HIGH (ref 70–99)
Glucose-Capillary: 122 mg/dL — ABNORMAL HIGH (ref 70–99)
Glucose-Capillary: 124 mg/dL — ABNORMAL HIGH (ref 70–99)
Glucose-Capillary: 158 mg/dL — ABNORMAL HIGH (ref 70–99)
Glucose-Capillary: 158 mg/dL — ABNORMAL HIGH (ref 70–99)
Glucose-Capillary: 170 mg/dL — ABNORMAL HIGH (ref 70–99)

## 2020-01-17 LAB — COMPREHENSIVE METABOLIC PANEL
ALT: 15 U/L (ref 0–44)
AST: 15 U/L (ref 15–41)
Albumin: 2.7 g/dL — ABNORMAL LOW (ref 3.5–5.0)
Alkaline Phosphatase: 61 U/L (ref 38–126)
Anion gap: 7 (ref 5–15)
BUN: <5 mg/dL — ABNORMAL LOW (ref 8–23)
CO2: 25 mmol/L (ref 22–32)
Calcium: 8.7 mg/dL — ABNORMAL LOW (ref 8.9–10.3)
Chloride: 108 mmol/L (ref 98–111)
Creatinine, Ser: 0.36 mg/dL — ABNORMAL LOW (ref 0.44–1.00)
GFR, Estimated: >60 mL/min (ref >60)
Glucose, Bld: 121 mg/dL — ABNORMAL HIGH (ref 70–99)
Potassium: 3.3 mmol/L — ABNORMAL LOW (ref 3.5–5.1)
Sodium: 140 mmol/L (ref 135–145)
Total Bilirubin: 0.6 mg/dL (ref 0.3–1.2)
Total Protein: 5.6 g/dL — ABNORMAL LOW (ref 6.5–8.1)

## 2020-01-17 LAB — LIPASE, BLOOD: Lipase: 25 U/L (ref 11–51)

## 2020-01-17 NOTE — Progress Notes (Signed)
Subjective: Patient reports intermittent abdominal pain, able to tolerate clears without nausea or vomiting for the last 2 days, had a bowel movement yesterday where stool was described as loose.  Objective: Vital signs in last 24 hours: Temp:  [97.6 F (36.4 C)-98.9 F (37.2 C)] 97.6 F (36.4 C) (10/09 0521) Pulse Rate:  [63-73] 63 (10/09 0521) Resp:  [14-24] 14 (10/09 0521) BP: (126-143)/(64-67) 143/64 (10/09 0521) SpO2:  [91 %-97 %] 91 % (10/09 0521) Weight:  [74.6 kg] 74.6 kg (10/09 0521) Weight change: 1.616 kg Last BM Date: 01/15/20  PE: Overweight, not in distress GENERAL: Moist mucous membrane, mild pallor, no icterus ABDOMEN: Soft, nontender, normal active bowel sounds EXTREMITIES: No deformity  Lab Results: Results for orders placed or performed during the hospital encounter of 01/08/20 (from the past 48 hour(s))  Glucose, capillary     Status: Abnormal   Collection Time: 01/15/20  4:52 PM  Result Value Ref Range   Glucose-Capillary 156 (H) 70 - 99 mg/dL    Comment: Glucose reference range applies only to samples taken after fasting for at least 8 hours.  Glucose, capillary     Status: Abnormal   Collection Time: 01/15/20  7:51 PM  Result Value Ref Range   Glucose-Capillary 139 (H) 70 - 99 mg/dL    Comment: Glucose reference range applies only to samples taken after fasting for at least 8 hours.  Glucose, capillary     Status: Abnormal   Collection Time: 01/15/20 11:00 PM  Result Value Ref Range   Glucose-Capillary 115 (H) 70 - 99 mg/dL    Comment: Glucose reference range applies only to samples taken after fasting for at least 8 hours.  Glucose, capillary     Status: Abnormal   Collection Time: 01/16/20  3:32 AM  Result Value Ref Range   Glucose-Capillary 133 (H) 70 - 99 mg/dL    Comment: Glucose reference range applies only to samples taken after fasting for at least 8 hours.  CBC with Differential/Platelet     Status: Abnormal   Collection Time: 01/16/20   5:23 AM  Result Value Ref Range   WBC 7.8 4.0 - 10.5 K/uL   RBC 3.48 (L) 3.87 - 5.11 MIL/uL   Hemoglobin 10.7 (L) 12.0 - 15.0 g/dL   HCT 33.9 (L) 36 - 46 %   MCV 97.4 80.0 - 100.0 fL   MCH 30.7 26.0 - 34.0 pg   MCHC 31.6 30.0 - 36.0 g/dL   RDW 17.2 (H) 11.5 - 15.5 %   Platelets 211 150 - 400 K/uL   nRBC 0.0 0.0 - 0.2 %   Neutrophils Relative % 61 %   Neutro Abs 4.7 1.7 - 7.7 K/uL   Lymphocytes Relative 18 %   Lymphs Abs 1.4 0.7 - 4.0 K/uL   Monocytes Relative 17 %   Monocytes Absolute 1.3 (H) 0.1 - 1.0 K/uL   Eosinophils Relative 3 %   Eosinophils Absolute 0.2 0 - 0 K/uL   Basophils Relative 0 %   Basophils Absolute 0.0 0 - 0 K/uL   Immature Granulocytes 1 %   Abs Immature Granulocytes 0.09 (H) 0.00 - 0.07 K/uL    Comment: Performed at [facility], [place] [address] [place], [place] [address]  Comprehensive metabolic panel     Status: Abnormal   Collection Time: 01/16/20  5:23 AM  Result Value Ref Range   Sodium 137 135 - 145 mmol/L   Potassium 3.6 3.5 - 5.1 mmol/L   Chloride  106 98 - 111 mmol/L   CO2 23 22 - 32 mmol/L   Glucose, Bld 88 70 - 99 mg/dL    Comment: Glucose reference range applies only to samples taken after fasting for at least 8 hours.   BUN <5 (L) 8 - 23 mg/dL   Creatinine, Ser 0.46 0.44 - 1.00 mg/dL   Calcium 8.8 (L) 8.9 - 10.3 mg/dL   Total Protein 5.8 (L) 6.5 - 8.1 g/dL   Albumin 2.9 (L) 3.5 - 5.0 g/dL   AST 20 15 - 41 U/L   ALT 16 0 - 44 U/L   Alkaline Phosphatase 60 38 - 126 U/L   Total Bilirubin 0.6 0.3 - 1.2 mg/dL   GFR calc non Af Amer >60 >60 mL/min   Anion gap 8 5 - 15    Comment: Performed at [facility], [place] [address], [place], [place] [address]  Magnesium     Status: None   Collection Time: 01/16/20  5:23 AM  Result Value Ref Range   Magnesium 2.1 1.7 - 2.4 mg/dL    Comment: Performed at [facility], [place] [address], [place], [place] [address]  Phosphorus     Status: None    Collection Time: 01/16/20  5:23 AM  Result Value Ref Range   Phosphorus 3.1 2.5 - 4.6 mg/dL    Comment: Performed at [facility], [place] [address], [place], [place] [address]  Lipase, blood     Status: None   Collection Time: 01/16/20  5:23 AM  Result Value Ref Range   Lipase 29 11 - 51 U/L    Comment: Performed at [facility], [place] [address], [place], [place] [address]  Glucose, capillary     Status: None   Collection Time: 01/16/20  7:33 AM  Result Value Ref Range   Glucose-Capillary 96 70 - 99 mg/dL    Comment: Glucose reference range applies only to samples taken after fasting for at least 8 hours.  Glucose, capillary     Status: Abnormal   Collection Time: 01/16/20 11:39 AM  Result Value Ref Range   Glucose-Capillary 127 (H) 70 - 99 mg/dL    Comment: Glucose reference range applies only to samples taken after fasting for at least 8 hours.  Lipid panel     Status: Abnormal   Collection Time: 01/16/20 11:40 AM  Result Value Ref Range   Cholesterol 111 0 - 200 mg/dL   Triglycerides 95 <150 mg/dL   HDL 31 (L) >40 mg/dL   Total CHOL/HDL Ratio 3.6 RATIO   VLDL 19 0 - 40 mg/dL   LDL Cholesterol 61 0 - 99 mg/dL    Comment:        Total Cholesterol/HDL:CHD Risk Coronary Heart Disease Risk Table                     Men   Women  1/2 Average Risk   3.4   3.3  Average Risk       5.0   4.4  2 X Average Risk   9.6   7.1  3 X Average Risk  23.4   11.0        Use the calculated Patient Ratio above and the CHD Risk Table to determine the patient's CHD Risk.        ATP III CLASSIFICATION (LDL):  <100     mg/dL   Optimal  100-129  mg/dL   Near or  Above                    Optimal  130-159  mg/dL   Borderline  160-189  mg/dL   High  >190     mg/dL   Very High Performed at [place] [address] [place], [place] [address]   Glucose, capillary     Status: Abnormal   Collection Time: 01/16/20  4:18 PM  Result Value Ref  Range   Glucose-Capillary 155 (H) 70 - 99 mg/dL    Comment: Glucose reference range applies only to samples taken after fasting for at least 8 hours.  Glucose, capillary     Status: Abnormal   Collection Time: 01/16/20  7:43 PM  Result Value Ref Range   Glucose-Capillary 131 (H) 70 - 99 mg/dL    Comment: Glucose reference range applies only to samples taken after fasting for at least 8 hours.  Glucose, capillary     Status: Abnormal   Collection Time: 01/17/20 12:05 AM  Result Value Ref Range   Glucose-Capillary 158 (H) 70 - 99 mg/dL    Comment: Glucose reference range applies only to samples taken after fasting for at least 8 hours.  Glucose, capillary     Status: Abnormal   Collection Time: 01/17/20  4:14 AM  Result Value Ref Range   Glucose-Capillary 124 (H) 70 - 99 mg/dL    Comment: Glucose reference range applies only to samples taken after fasting for at least 8 hours.  Comprehensive metabolic panel     Status: Abnormal   Collection Time: 01/17/20  7:09 AM  Result Value Ref Range   Sodium 140 135 - 145 mmol/L   Potassium 3.3 (L) 3.5 - 5.1 mmol/L   Chloride 108 98 - 111 mmol/L   CO2 25 22 - 32 mmol/L   Glucose, Bld 121 (H) 70 - 99 mg/dL    Comment: Glucose reference range applies only to samples taken after fasting for at least 8 hours.   BUN <5 (L) 8 - 23 mg/dL   Creatinine, Ser 0.36 (L) 0.44 - 1.00 mg/dL   Calcium 8.7 (L) 8.9 - 10.3 mg/dL   Total Protein 5.6 (L) 6.5 - 8.1 g/dL   Albumin 2.7 (L) 3.5 - 5.0 g/dL   AST 15 15 - 41 U/L   ALT 15 0 - 44 U/L   Alkaline Phosphatase 61 38 - 126 U/L   Total Bilirubin 0.6 0.3 - 1.2 mg/dL   GFR, Estimated >60 >60 mL/min   Anion gap 7 5 - 15    Comment: Performed at [facility], [place] [address], [place], [place] [address]  Lipase, blood     Status: None   Collection Time: 01/17/20  7:09 AM  Result Value Ref Range   Lipase 25 11 - 51 U/L    Comment: Performed at [facility], [place] [address], [place], [place] [address]  Glucose, capillary     Status: Abnormal   Collection Time: 01/17/20  7:28 AM  Result Value Ref Range   Glucose-Capillary 118 (H) 70 - 99 mg/dL    Comment: Glucose reference range applies only to samples taken after fasting for at least 8 hours.  Glucose, capillary     Status: Abnormal   Collection Time: 01/17/20 11:52 AM  Result Value Ref Range   Glucose-Capillary 170 (H) 70 - 99 mg/dL    Comment: Glucose reference range applies only to samples taken after fasting  for at least 8 hours.    Studies/Results: No results found.  Medications: I have reviewed the patient's current medications.  Assessment: Acute pancreatitis, etiology not known-denies alcohol abuse/recent use, is post cholecystectomy, triglycerides normal at 95 Denies recent new medications  Clinically improving Pain adequately controlled  Plan: We will change to full liquid diet, on IV fluid normal saline at 150 cc an hour We will send ANA and IgG subclasses to complete work-up for pancreatitis, possibly idiopathic.   Ronnette Juniper, MD 01/17/2020, 12:59 PM

## 2020-01-17 NOTE — Progress Notes (Signed)
PROGRESS NOTE    [name]  ZOX:[id number] DOB: [birth year] DOA: 01/08/2020 PCP: Bartholome Bill, MD    Brief Narrative:  64 yo CF with PMH schizoaffective d/c, depression, IBS, prolapsed internal hemorrhoids grade 3, tubular adenoma of colon, diverticulosis who presented with abdominal pain. She underwent work-up and was found to have elevated lipase, 1210 and CT findings consistent with uncomplicated pancreatitis with peripancreatic stranding.  She had also not eaten approximately 6 days prior to admission and was n.p.o. for 4 days during hospitalization.  Due to difficulty tolerating trial of clear liquids on 01/12/2020, she underwent repeat CT abdomen/pelvis.  This was notable for showing improved pancreatitis and decreased peripancreatic edema.  No pancreatic necrosis appreciated  Assessment & Plan:   Principal Problem:   Acute pancreatitis Active Problems:   Schizoaffective disorder, depressive type (HCC)   Cigarette nicotine dependence without complication   Depression   Diverticulosis   HTN (hypertension)   Hypophosphatemia   Hypokalemia  Hypokalemia -low this AM at 3.3, will replace -Repeat lytes in AM  Hypophosphatemia -recently replaced, currently remains within normal limits -Will recheck level in AM  HTN (hypertension) - continue current regimen -BP stable and controlled at this time  Diverticulosis -without evidence of bleeding noted  Depression - seems stable currently  Acute pancreatitis - sepsis ruled out after admission -Lipase initially 1210 with CT findings consistent with pancreatitis with peripancreatic edema -Lipase now trended down to normal with recent CT findings on 10/4 indicating much improved pancreatitis -recently tolerated clears and full liquids, however later unable to tolerate soft diet, citing abd pain requiring more IV analgesia -Appreciate input by GI. Recommendation to advance diet slowly. To advance to full liquid  today -Per GI, plan to f/u ANA and IgG to complete work up  Cigarette nicotine dependence without complication - Continue nicotine patch   Schizoaffective disorder, depressive type ([place]) - Benztropine 1 mg daily; zoloft 100 mg daily; Geodon 80 mgqhs -Affect seems normal currently   DVT prophylaxis: Lovenox subq Code Status: Full Family Communication: Pt in room, family not at bedside  Status is: Inpatient  Remains inpatient appropriate because:Ongoing active pain requiring inpatient pain management   Dispo: The patient is from: Home              Anticipated d/c is to: Home              Anticipated d/c date is: 3 days              Patient currently is not medically stable to d/c.  Consultants:   [facility]  Procedures:     Antimicrobials: Anti-infectives (From admission, onward)   Start     Dose/Rate Route Frequency Ordered Stop   01/08/20 1600  meropenem (MERREM) 1 g in sodium chloride 0.9 % 100 mL IVPB  Status:  Discontinued        1 g 200 mL/hr over 30 Minutes Intravenous Every 8 hours 01/08/20 1540 01/09/20 1338      Subjective: Feels better today  Objective: Vitals:   01/16/20 1436 01/16/20 2236 01/17/20 0521 01/17/20 1403  BP: 126/65 132/67 (!) 143/64 135/84  Pulse: 68 73 63 68  Resp: (!) 24 14 14 16   Temp: 97.7 F (36.5 C) 98.9 F (37.2 C) 97.6 F (36.4 C) 98.8 F (37.1 C)  TempSrc: Oral Oral Oral Oral  SpO2: 97% 94% 91% 98%  Weight:   74.6 kg   Height:        Intake/Output Summary (  Last 24 hours) at 01/17/2020 1431 Last data filed at 01/17/2020 1235 Gross per 24 hour  Intake 2767.9 ml  Output 540 ml  Net 2227.9 ml   Filed Weights   01/15/20 0613 01/16/20 0500 01/17/20 0521  Weight: 75.1 kg 73 kg 74.6 kg    Examination: General exam: Conversant, in no acute distress Respiratory system: normal chest rise, clear, no audible wheezing Cardiovascular system: regular rhythm, s1-s2 Gastrointestinal system: Nondistended, pos BS Central  nervous system: No seizures, no tremors Extremities: No cyanosis, no joint deformities Skin: No rashes, no pallor Psychiatry: Affect normal // no auditory hallucinations   Data Reviewed: I have personally reviewed following labs and imaging studies  CBC: Recent Labs  Lab 01/12/20 0626 01/13/20 0558 01/14/20 0523 01/15/20 0508 01/16/20 0523  WBC 9.6 8.1 9.3 8.0 7.8  NEUTROABS 7.8* 6.2 6.7 5.4 4.7  HGB 12.3 11.1* 11.0* 10.9* 10.7*  HCT 36.4 33.1* 33.3* 33.3* 33.9*  MCV 90.5 90.9 93.3 93.5 97.4  PLT 165 161 198 198 800   Basic Metabolic Panel: Recent Labs  Lab 01/12/20 0626 01/12/20 0626 01/13/20 0558 01/14/20 0523 01/15/20 0508 01/16/20 0523 01/17/20 0709  NA 139   < > 139 135 138 137 140  K 3.3*   < > 2.6* 3.1* 3.7 3.6 3.3*  CL 101   < > 102 101 104 106 108  CO2 24   < > 23 25 26 23 25   GLUCOSE 90   < > 84 113* 111* 88 121*  BUN 6*   < > 6* <5* <5* <5* <5*  CREATININE 0.40*   < > 0.44 0.36* 0.43* 0.46 0.36*  CALCIUM 9.0   < > 8.8* 8.7* 8.8* 8.8* 8.7*  MG 2.0  --  1.9 1.6* 2.2 2.1  --   PHOS 1.9*  --  2.6 2.1* 2.8 3.1  --    < > = values in this interval not displayed.   GFR: Estimated Creatinine Clearance: 67.2 mL/min (A) (by C-G formula based on SCr of 0.36 mg/dL (L)). Liver Function Tests: Recent Labs  Lab 01/13/20 0558 01/14/20 0523 01/15/20 0508 01/16/20 0523 01/17/20 0709  AST 19 17 18 20 15   ALT 18 17 17 16 15   ALKPHOS 65 64 62 60 61  BILITOT 1.5* 0.8 0.7 0.6 0.6  PROT 5.6* 5.8* 5.9* 5.8* 5.6*  ALBUMIN 2.8* 3.0* 3.0* 2.9* 2.7*   Recent Labs  Lab 01/11/20 0644 01/12/20 0626 01/15/20 0508 01/16/20 0523 01/17/20 0709  LIPASE 31 37 35 29 25   No results for input(s): AMMONIA in the last 168 hours. Coagulation Profile: No results for input(s): INR, PROTIME in the last 168 hours. Cardiac Enzymes: No results for input(s): CKTOTAL, CKMB, CKMBINDEX, TROPONINI in the last 168 hours. BNP (last 3 results) No results for input(s): PROBNP in the last  8760 hours. HbA1C: No results for input(s): HGBA1C in the last 72 hours. CBG: Recent Labs  Lab 01/16/20 1943 01/17/20 0005 01/17/20 0414 01/17/20 0728 01/17/20 1152  GLUCAP 131* 158* 124* 118* 170*   Lipid Profile: Recent Labs    01/16/20 1140  CHOL 111  HDL 31*  LDLCALC 61  TRIG 95  CHOLHDL 3.6   Thyroid Function Tests: No results for input(s): TSH, T4TOTAL, FREET4, T3FREE, THYROIDAB in the last 72 hours. Anemia Panel: No results for input(s): VITAMINB12, FOLATE, FERRITIN, TIBC, IRON, RETICCTPCT in the last 72 hours. Sepsis Labs: No results for input(s): PROCALCITON, LATICACIDVEN in the last 168 hours.  Recent Results (from the  past 240 hour(s))  Blood culture (routine single)     Status: None   Collection Time: 01/08/20  2:49 PM   Specimen: BLOOD LEFT FOREARM  Result Value Ref Range Status   Specimen Description   Final    BLOOD LEFT FOREARM Performed at [facility] [address] [place], [place] [address]    Special Requests   Final    BOTTLES DRAWN AEROBIC AND ANAEROBIC Blood Culture adequate volume Performed at [facility] [address] [place], [place] [address]    Culture   Final    NO GROWTH 5 DAYS Performed at [facility], [place] [address] [place], [place] [address]    Report Status 01/13/2020 FINAL  Final  Respiratory Panel by RT PCR (Flu A&B, Covid) - Nasopharyngeal Swab     Status: None   Collection Time: 01/08/20  2:53 PM   Specimen: Nasopharyngeal Swab  Result Value Ref Range Status   SARS Coronavirus 2 by RT PCR NEGATIVE NEGATIVE Final    Comment: (NOTE) SARS-CoV-2 target nucleic acids are NOT DETECTED.  The SARS-CoV-2 RNA is generally detectable in upper respiratoy specimens during the acute phase of infection. The lowest concentration of SARS-CoV-2 viral copies this assay can detect is 131 copies/mL. A negative result does not preclude SARS-Cov-2 infection and should not be used as the  sole basis for treatment or other patient management decisions. A negative result may occur with  improper specimen collection/handling, submission of specimen other than nasopharyngeal swab, presence of viral mutation(s) within the areas targeted by this assay, and inadequate number of viral copies (<131 copies/mL). A negative result must be combined with clinical observations, patient history, and epidemiological information. The expected result is Negative.  Fact Sheet for Patients:  PinkCheek.be  Fact Sheet for Healthcare Providers:  GravelBags.it  This test is no t yet approved or cleared by the [place] FDA and  has been authorized for detection and/or diagnosis of SARS-CoV-2 by FDA under an Emergency Use Authorization (EUA). This EUA will remain  in effect (meaning this test can be used) for the duration of the COVID-19 declaration under Section 564(b)(1) of the Act, 21 U.S.C. section 360bbb-3(b)(1), unless the authorization is terminated or revoked sooner.     Influenza A by PCR NEGATIVE NEGATIVE Final   Influenza B by PCR NEGATIVE NEGATIVE Final    Comment: (NOTE) The Xpert Xpress SARS-CoV-2/FLU/RSV assay is intended as an aid in  the diagnosis of influenza from Nasopharyngeal swab specimens and  should not be used as a sole basis for treatment. Nasal washings and  aspirates are unacceptable for Xpert Xpress SARS-CoV-2/FLU/RSV  testing.  Fact Sheet for Patients: PinkCheek.be  Fact Sheet for Healthcare Providers: GravelBags.it  This test is not yet approved or cleared by the [place] FDA and  has been authorized for detection and/or diagnosis of SARS-CoV-2 by  FDA under an Emergency Use Authorization (EUA). This EUA will remain  in effect (meaning this test can be used) for the duration of the  Covid-19 declaration under Section 564(b)(1) of  the Act, 21  U.S.C. section 360bbb-3(b)(1), unless the authorization is  terminated or revoked. Performed at [facility], [place] [address] [place], [place] [address]   Culture, blood (single)     Status: None   Collection Time: 01/08/20  3:41 PM   Specimen: BLOOD  Result Value Ref Range Status   Specimen Description   Final    BLOOD RIGHT ANTECUBITAL  Performed at [facility], [place] [address], [place], [place] [address]    Special Requests   Final    BOTTLES DRAWN AEROBIC AND ANAEROBIC Blood Culture results may not be optimal due to an excessive volume of blood received in culture bottles Performed at [facility] [address] [place], [place] [address]    Culture   Final    NO GROWTH 5 DAYS Performed at [facility], [place] [address] [place], [place] [address]    Report Status 01/13/2020 FINAL  Final  Urine culture     Status: None   Collection Time: 01/08/20  6:02 PM   Specimen: In/Out Cath Urine  Result Value Ref Range Status   Specimen Description   Final    IN/OUT CATH URINE Performed at [facility] [address] [place], [place] [address]    Special Requests   Final    NONE Performed at [facility], [place] [address], [place], [place] [address]    Culture   Final    NO GROWTH Performed at [facility], [place] [address], [place], [place] [address]    Report Status 01/09/2020 FINAL  Final     Radiology Studies: No results found.  Scheduled Meds: . benztropine  1 mg Oral Daily  . enoxaparin (LOVENOX) injection  40 mg Subcutaneous Q24H  . feeding supplement  1 Container Oral TID BM  . insulin aspart  0-9 Units Subcutaneous Q4H  . lisinopril  20 mg Oral Daily  . nicotine  14 mg Transdermal Daily  . pantoprazole  40 mg Oral Daily  . sertraline  100 mg Oral Daily  . ziprasidone  80 mg Oral QHS   Continuous Infusions: . sodium chloride 150 mL/hr at  01/17/20 0800     LOS: 9 days   Marylu Lund, MD [facility] Pager On Amion  If 7PM-7AM, please contact night-coverage 01/17/2020, 2:31 PM

## 2020-01-18 DIAGNOSIS — K5790 Diverticulosis of intestine, part unspecified, without perforation or abscess without bleeding: Secondary | ICD-10-CM | POA: Diagnosis not present

## 2020-01-18 DIAGNOSIS — K859 Acute pancreatitis without necrosis or infection, unspecified: Secondary | ICD-10-CM | POA: Diagnosis not present

## 2020-01-18 DIAGNOSIS — F17210 Nicotine dependence, cigarettes, uncomplicated: Secondary | ICD-10-CM | POA: Diagnosis not present

## 2020-01-18 LAB — GLUCOSE, CAPILLARY
Glucose-Capillary: 119 mg/dL — ABNORMAL HIGH (ref 70–99)
Glucose-Capillary: 126 mg/dL — ABNORMAL HIGH (ref 70–99)
Glucose-Capillary: 139 mg/dL — ABNORMAL HIGH (ref 70–99)
Glucose-Capillary: 141 mg/dL — ABNORMAL HIGH (ref 70–99)
Glucose-Capillary: 147 mg/dL — ABNORMAL HIGH (ref 70–99)
Glucose-Capillary: 168 mg/dL — ABNORMAL HIGH (ref 70–99)

## 2020-01-18 LAB — COMPREHENSIVE METABOLIC PANEL
ALT: 17 U/L (ref 0–44)
AST: 20 U/L (ref 15–41)
Albumin: 3 g/dL — ABNORMAL LOW (ref 3.5–5.0)
Alkaline Phosphatase: 68 U/L (ref 38–126)
Anion gap: 10 (ref 5–15)
BUN: <5 mg/dL — ABNORMAL LOW (ref 8–23)
CO2: 24 mmol/L (ref 22–32)
Calcium: 8.9 mg/dL (ref 8.9–10.3)
Chloride: 105 mmol/L (ref 98–111)
Creatinine, Ser: 0.42 mg/dL — ABNORMAL LOW (ref 0.44–1.00)
GFR, Estimated: >60 mL/min (ref >60)
Glucose, Bld: 133 mg/dL — ABNORMAL HIGH (ref 70–99)
Potassium: 3.3 mmol/L — ABNORMAL LOW (ref 3.5–5.1)
Sodium: 139 mmol/L (ref 135–145)
Total Bilirubin: 0.8 mg/dL (ref 0.3–1.2)
Total Protein: 6.1 g/dL — ABNORMAL LOW (ref 6.5–8.1)

## 2020-01-18 LAB — IGG: IgG (Immunoglobin G), Serum: 659 mg/dL (ref 586–1602)

## 2020-01-18 MED ORDER — POTASSIUM CHLORIDE CRYS ER 20 MEQ PO TBCR
40.0000 meq | EXTENDED_RELEASE_TABLET | Freq: Once | ORAL | Status: AC
  Administered 2020-01-18: 40 meq via ORAL
  Filled 2020-01-18: qty 2

## 2020-01-18 NOTE — Progress Notes (Signed)
Subjective: Patient reports improvement in her clinical status. She was able to tolerate full liquids yesterday and her diet is being advanced to soft diet today morning. She reports 1 bowel movement yesterday and 1 bowel movement today, described as mushy. Her abdominal pain is fairly controlled with pain medications every 3-4 hours. She denies nausea/ vomiting or shortness of breath.  Objective: Vital signs in last 24 hours: Temp:  [98 F (36.7 C)-98.8 F (37.1 C)] 98 F (36.7 C) (10/10 0556) Pulse Rate:  [68-71] 69 (10/10 0556) Resp:  [16] 16 (10/10 0556) BP: (135-173)/(84-89) 173/89 (10/10 0556) SpO2:  [95 %-98 %] 95 % (10/10 0556) Weight change:  Last BM Date: 01/17/20  PE: Not in distress, able to speak in full sentences GENERAL: Mild pallor, moist mucous membrane ABDOMEN: Soft, minimally tender in epigastric area, normoactive bowel sounds EXTREMITIES: No deformity, no edema  Lab Results: Results for orders placed or performed during the hospital encounter of 01/08/20 (from the past 48 hour(s))  Glucose, capillary     Status: Abnormal   Collection Time: 01/16/20 11:39 AM  Result Value Ref Range   Glucose-Capillary 127 (H) 70 - 99 mg/dL    Comment: Glucose reference range applies only to samples taken after fasting for at least 8 hours.  Lipid panel     Status: Abnormal   Collection Time: 01/16/20 11:40 AM  Result Value Ref Range   Cholesterol 111 0 - 200 mg/dL   Triglycerides 95 <150 mg/dL   HDL 31 (L) >40 mg/dL   Total CHOL/HDL Ratio 3.6 RATIO   VLDL 19 0 - 40 mg/dL   LDL Cholesterol 61 0 - 99 mg/dL    Comment:        Total Cholesterol/HDL:CHD Risk Coronary Heart Disease Risk Table                     Men   Women  1/2 Average Risk   3.4   3.3  Average Risk       5.0   4.4  2 X Average Risk   9.6   7.1  3 X Average Risk  23.4   11.0        Use the calculated Patient Ratio above and the CHD Risk Table to determine the patient's CHD Risk.        ATP III  CLASSIFICATION (LDL):  <100     mg/dL   Optimal  100-129  mg/dL   Near or Above                    Optimal  130-159  mg/dL   Borderline  160-189  mg/dL   High  >190     mg/dL   Very High Performed at [facility] [address] [place], [place] [address]   Glucose, capillary     Status: Abnormal   Collection Time: 01/16/20  4:18 PM  Result Value Ref Range   Glucose-Capillary 155 (H) 70 - 99 mg/dL    Comment: Glucose reference range applies only to samples taken after fasting for at least 8 hours.  Glucose, capillary     Status: Abnormal   Collection Time: 01/16/20  7:43 PM  Result Value Ref Range   Glucose-Capillary 131 (H) 70 - 99 mg/dL    Comment: Glucose reference range applies only to samples taken after fasting for at least 8 hours.  Glucose, capillary     Status: Abnormal   Collection Time: 01/17/20  12:05 AM  Result Value Ref Range   Glucose-Capillary 158 (H) 70 - 99 mg/dL    Comment: Glucose reference range applies only to samples taken after fasting for at least 8 hours.  Glucose, capillary     Status: Abnormal   Collection Time: 01/17/20  4:14 AM  Result Value Ref Range   Glucose-Capillary 124 (H) 70 - 99 mg/dL    Comment: Glucose reference range applies only to samples taken after fasting for at least 8 hours.  Comprehensive metabolic panel     Status: Abnormal   Collection Time: 01/17/20  7:09 AM  Result Value Ref Range   Sodium 140 135 - 145 mmol/L   Potassium 3.3 (L) 3.5 - 5.1 mmol/L   Chloride 108 98 - 111 mmol/L   CO2 25 22 - 32 mmol/L   Glucose, Bld 121 (H) 70 - 99 mg/dL    Comment: Glucose reference range applies only to samples taken after fasting for at least 8 hours.   BUN <5 (L) 8 - 23 mg/dL   Creatinine, Ser 0.36 (L) 0.44 - 1.00 mg/dL   Calcium 8.7 (L) 8.9 - 10.3 mg/dL   Total Protein 5.6 (L) 6.5 - 8.1 g/dL   Albumin 2.7 (L) 3.5 - 5.0 g/dL   AST 15 15 - 41 U/L   ALT 15 0 - 44 U/L   Alkaline Phosphatase 61 38 - 126 U/L   Total  Bilirubin 0.6 0.3 - 1.2 mg/dL   GFR, Estimated >60 >60 mL/min   Anion gap 7 5 - 15    Comment: Performed at [facility], [place] [address], [place], [place] [address]  Lipase, blood     Status: None   Collection Time: 01/17/20  7:09 AM  Result Value Ref Range   Lipase 25 11 - 51 U/L    Comment: Performed at [facility], [place] [address], [place], [place] [address]  Glucose, capillary     Status: Abnormal   Collection Time: 01/17/20  7:28 AM  Result Value Ref Range   Glucose-Capillary 118 (H) 70 - 99 mg/dL    Comment: Glucose reference range applies only to samples taken after fasting for at least 8 hours.  Glucose, capillary     Status: Abnormal   Collection Time: 01/17/20 11:52 AM  Result Value Ref Range   Glucose-Capillary 170 (H) 70 - 99 mg/dL    Comment: Glucose reference range applies only to samples taken after fasting for at least 8 hours.  Glucose, capillary     Status: Abnormal   Collection Time: 01/17/20  4:41 PM  Result Value Ref Range   Glucose-Capillary 158 (H) 70 - 99 mg/dL    Comment: Glucose reference range applies only to samples taken after fasting for at least 8 hours.  Glucose, capillary     Status: Abnormal   Collection Time: 01/17/20  8:09 PM  Result Value Ref Range   Glucose-Capillary 122 (H) 70 - 99 mg/dL    Comment: Glucose reference range applies only to samples taken after fasting for at least 8 hours.  Glucose, capillary     Status: Abnormal   Collection Time: 01/18/20 12:09 AM  Result Value Ref Range   Glucose-Capillary 126 (H) 70 - 99 mg/dL    Comment: Glucose reference range applies only to samples taken after fasting for at least 8 hours.  Glucose, capillary     Status: Abnormal   Collection Time: 01/18/20  4:07 AM  Result Value Ref  Range   Glucose-Capillary 139 (H) 70 - 99 mg/dL    Comment: Glucose reference range applies only to samples taken after fasting for at least 8 hours.  Comprehensive metabolic panel      Status: Abnormal   Collection Time: 01/18/20  6:51 AM  Result Value Ref Range   Sodium 139 135 - 145 mmol/L   Potassium 3.3 (L) 3.5 - 5.1 mmol/L   Chloride 105 98 - 111 mmol/L   CO2 24 22 - 32 mmol/L   Glucose, Bld 133 (H) 70 - 99 mg/dL    Comment: Glucose reference range applies only to samples taken after fasting for at least 8 hours.   BUN <5 (L) 8 - 23 mg/dL   Creatinine, Ser 0.42 (L) 0.44 - 1.00 mg/dL   Calcium 8.9 8.9 - 10.3 mg/dL   Total Protein 6.1 (L) 6.5 - 8.1 g/dL   Albumin 3.0 (L) 3.5 - 5.0 g/dL   AST 20 15 - 41 U/L   ALT 17 0 - 44 U/L   Alkaline Phosphatase 68 38 - 126 U/L   Total Bilirubin 0.8 0.3 - 1.2 mg/dL   GFR, Estimated >60 >60 mL/min   Anion gap 10 5 - 15    Comment: Performed at [facility], [place] [address], [place], [place] [address]  Glucose, capillary     Status: Abnormal   Collection Time: 01/18/20  7:45 AM  Result Value Ref Range   Glucose-Capillary 147 (H) 70 - 99 mg/dL    Comment: Glucose reference range applies only to samples taken after fasting for at least 8 hours.    Studies/Results: No results found.  Medications: I have reviewed the patient's current medications.  Assessment: Resolving pancreatitis of unknown etiology ANA and IgG subclasses have been ordered, results pending Mild hypokalemia  Plan: Clinical improvement noted, I will decrease normal saline to 75 mL/h and order for K. Dur 40 mEq p.o. x1 today. If she is able to tolerate soft diet, okay to DC home from GI standpoint.  Ronnette Juniper, MD 01/18/2020, 10:09 AM

## 2020-01-18 NOTE — Progress Notes (Signed)
PROGRESS NOTE    [name]  IEP:[id number] DOB: Dec 12, 1955 DOA: 01/08/2020 PCP: Bartholome Bill, MD    Brief Narrative:  64 yo CF with PMH schizoaffective d/c, depression, IBS, prolapsed internal hemorrhoids grade 3, tubular adenoma of colon, diverticulosis who presented with abdominal pain. She underwent work-up and was found to have elevated lipase, 1210 and CT findings consistent with uncomplicated pancreatitis with peripancreatic stranding.  She had also not eaten approximately 6 days prior to admission and was n.p.o. for 4 days during hospitalization.  Due to difficulty tolerating trial of clear liquids on 01/12/2020, she underwent repeat CT abdomen/pelvis.  This was notable for showing improved pancreatitis and decreased peripancreatic edema.  No pancreatic necrosis appreciated  Assessment & Plan:   Principal Problem:   Acute pancreatitis Active Problems:   Schizoaffective disorder, depressive type (HCC)   Cigarette nicotine dependence without complication   Depression   Diverticulosis   HTN (hypertension)   Hypophosphatemia   Hypokalemia  Hypokalemia -remains low this AM at 3.3, will replace -Repeat lytes in AM  Hypophosphatemia -recently replaced, currently remains within normal limits -cont to follow lytes in AM  HTN (hypertension) - continue current regimen -BP stable and controlled presently  Diverticulosis -without evidence of bleeding noted -Seems stable at this time  Depression - seems stable currently  Acute pancreatitis - sepsis ruled out after admission -Lipase initially 1210 with CT findings consistent with pancreatitis with peripancreatic edema -Lipase now trended down to normal with recent CT findings on 10/4 indicating much improved pancreatitis -recently tolerated clears and full liquids, however later unable to tolerate soft diet, citing abd pain requiring more IV analgesia -Appreciate input by GI. Recommendation to advance diet  slowly. Thus far unable to advance past clear liquid diet -Per GI, plan to f/u ANA and IgG to complete work up  Cigarette nicotine dependence without complication - Continue nicotine patch  -Will need cessation  Schizoaffective disorder, depressive type (HCC) - Benztropine 1 mg daily; zoloft 100 mg daily; Geodon 80 mgqhs -Affect seems normal currently   DVT prophylaxis: Lovenox subq Code Status: Full Family Communication: Pt in room, family not at bedside  Status is: Inpatient  Remains inpatient appropriate because:Ongoing active pain requiring inpatient pain management   Dispo: The patient is from: Home              Anticipated d/c is to: Home              Anticipated d/c date is: 3 days              Patient currently is not medically stable to d/c.  Consultants:   [facility]  Procedures:     Antimicrobials: Anti-infectives (From admission, onward)   Start     Dose/Rate Route Frequency Ordered Stop   01/08/20 1600  meropenem (MERREM) 1 g in sodium chloride 0.9 % 100 mL IVPB  Status:  Discontinued        1 g 200 mL/hr over 30 Minutes Intravenous Every 8 hours 01/08/20 1540 01/09/20 1338      Subjective: Reports nausea and epigastric pain with any diet beyond clear. Only drank liquids off her full liquid try last night and was unable to tolerate one bite of omlet this AM  Objective: Vitals:   01/17/20 1403 01/17/20 2009 01/18/20 0556 01/18/20 1347  BP: 135/84 (!) 167/85 (!) 173/89 (!) 141/73  Pulse: 68 71 69 67  Resp: 16 16 16    Temp: 98.8 F (37.1 C) 98.2 F (  36.8 C) 98 F (36.7 C) 98.8 F (37.1 C)  TempSrc: Oral Oral Oral Oral  SpO2: 98% 96% 95% 95%  Weight:      Height:        Intake/Output Summary (Last 24 hours) at 01/18/2020 1509 Last data filed at 01/18/2020 1509 Gross per 24 hour  Intake 3969.08 ml  Output 300 ml  Net 3669.08 ml   Filed Weights   01/15/20 0613 01/16/20 0500 01/17/20 0521  Weight: 75.1 kg 73 kg 74.6 kg     Examination: General exam: Awake, laying in bed, in nad Respiratory system: Normal respiratory effort, no wheezing Cardiovascular system: regular rate, s1, s2 Gastrointestinal system: Soft, nondistended, generally tender Central nervous system: CN2-12 grossly intact, strength intact Extremities: Perfused, no clubbing Skin: Normal skin turgor, no notable skin lesions seen Psychiatry: Mood normal // no visual hallucinations   Data Reviewed: I have personally reviewed following labs and imaging studies  CBC: Recent Labs  Lab 01/12/20 0626 01/13/20 0558 01/14/20 0523 01/15/20 0508 01/16/20 0523  WBC 9.6 8.1 9.3 8.0 7.8  NEUTROABS 7.8* 6.2 6.7 5.4 4.7  HGB 12.3 11.1* 11.0* 10.9* 10.7*  HCT 36.4 33.1* 33.3* 33.3* 33.9*  MCV 90.5 90.9 93.3 93.5 97.4  PLT 165 161 198 198 443   Basic Metabolic Panel: Recent Labs  Lab 01/12/20 0626 01/12/20 0626 01/13/20 0558 01/13/20 0558 01/14/20 0523 01/15/20 0508 01/16/20 0523 01/17/20 0709 01/18/20 0651  NA 139   < > 139   < > 135 138 137 140 139  K 3.3*   < > 2.6*   < > 3.1* 3.7 3.6 3.3* 3.3*  CL 101   < > 102   < > 101 104 106 108 105  CO2 24   < > 23   < > 25 26 23 25 24   GLUCOSE 90   < > 84   < > 113* 111* 88 121* 133*  BUN 6*   < > 6*   < > <5* <5* <5* <5* <5*  CREATININE 0.40*   < > 0.44   < > 0.36* 0.43* 0.46 0.36* 0.42*  CALCIUM 9.0   < > 8.8*   < > 8.7* 8.8* 8.8* 8.7* 8.9  MG 2.0  --  1.9  --  1.6* 2.2 2.1  --   --   PHOS 1.9*  --  2.6  --  2.1* 2.8 3.1  --   --    < > = values in this interval not displayed.   GFR: Estimated Creatinine Clearance: 67.2 mL/min (A) (by C-G formula based on SCr of 0.42 mg/dL (L)). Liver Function Tests: Recent Labs  Lab 01/14/20 0523 01/15/20 0508 01/16/20 0523 01/17/20 0709 01/18/20 0651  AST 17 18 20 15 20   ALT 17 17 16 15 17   ALKPHOS 64 62 60 61 68  BILITOT 0.8 0.7 0.6 0.6 0.8  PROT 5.8* 5.9* 5.8* 5.6* 6.1*  ALBUMIN 3.0* 3.0* 2.9* 2.7* 3.0*   Recent Labs  Lab  01/12/20 0626 01/15/20 0508 01/16/20 0523 01/17/20 0709  LIPASE 37 35 29 25   No results for input(s): AMMONIA in the last 168 hours. Coagulation Profile: No results for input(s): INR, PROTIME in the last 168 hours. Cardiac Enzymes: No results for input(s): CKTOTAL, CKMB, CKMBINDEX, TROPONINI in the last 168 hours. BNP (last 3 results) No results for input(s): PROBNP in the last 8760 hours. HbA1C: No results for input(s): HGBA1C in the last 72 hours. CBG: Recent Labs  Lab 01/17/20  1641 01/17/20 2009 01/18/20 0009 01/18/20 0407 01/18/20 0745  GLUCAP 158* 122* 126* 139* 147*   Lipid Profile: Recent Labs    01/16/20 1140  CHOL 111  HDL 31*  LDLCALC 61  TRIG 95  CHOLHDL 3.6   Thyroid Function Tests: No results for input(s): TSH, T4TOTAL, FREET4, T3FREE, THYROIDAB in the last 72 hours. Anemia Panel: No results for input(s): VITAMINB12, FOLATE, FERRITIN, TIBC, IRON, RETICCTPCT in the last 72 hours. Sepsis Labs: No results for input(s): PROCALCITON, LATICACIDVEN in the last 168 hours.  Recent Results (from the past 240 hour(s))  Culture, blood (single)     Status: None   Collection Time: 01/08/20  3:41 PM   Specimen: BLOOD  Result Value Ref Range Status   Specimen Description   Final    BLOOD RIGHT ANTECUBITAL Performed at [facility] [address], [place], [place] [address]    Special Requests   Final    BOTTLES DRAWN AEROBIC AND ANAEROBIC Blood Culture results may not be optimal due to an excessive volume of blood received in culture bottles Performed at [facility] [address] [place], [place] [address]    Culture   Final    NO GROWTH 5 DAYS Performed at [facility], [place] [address], [place], [place] [address]    Report Status 01/13/2020 FINAL  Final  Urine culture     Status: None   Collection Time: 01/08/20  6:02 PM   Specimen: In/Out Cath Urine  Result Value Ref Range Status   Specimen Description    Final    IN/OUT CATH URINE Performed at [facility] [address] [place], [place] [address]    Special Requests   Final    NONE Performed at [facility], [place] [address], [place], [place] [address]    Culture   Final    NO GROWTH Performed at [facility], [place] [address], [place], [place] [address]    Report Status 01/09/2020 FINAL  Final     Radiology Studies: No results found.  Scheduled Meds:  benztropine  1 mg Oral Daily   enoxaparin (LOVENOX) injection  40 mg Subcutaneous Q24H   feeding supplement  1 Container Oral TID BM   insulin aspart  0-9 Units Subcutaneous Q4H   lisinopril  20 mg Oral Daily   nicotine  14 mg Transdermal Daily   pantoprazole  40 mg Oral Daily   sertraline  100 mg Oral Daily   ziprasidone  80 mg Oral QHS   Continuous Infusions:  sodium chloride 75 mL/hr at 01/18/20 1509     LOS: 10 days   Marylu Lund, MD [facility] Pager On Amion  If 7PM-7AM, please contact night-coverage 01/18/2020, 3:09 PM

## 2020-01-19 DIAGNOSIS — F17210 Nicotine dependence, cigarettes, uncomplicated: Secondary | ICD-10-CM | POA: Diagnosis not present

## 2020-01-19 DIAGNOSIS — F320 Major depressive disorder, single episode, mild: Secondary | ICD-10-CM | POA: Diagnosis not present

## 2020-01-19 DIAGNOSIS — K859 Acute pancreatitis without necrosis or infection, unspecified: Secondary | ICD-10-CM | POA: Diagnosis not present

## 2020-01-19 LAB — COMPREHENSIVE METABOLIC PANEL
ALT: 16 U/L (ref 0–44)
AST: 23 U/L (ref 15–41)
Albumin: 3.1 g/dL — ABNORMAL LOW (ref 3.5–5.0)
Alkaline Phosphatase: 64 U/L (ref 38–126)
Anion gap: 10 (ref 5–15)
BUN: <5 mg/dL — ABNORMAL LOW (ref 8–23)
CO2: 23 mmol/L (ref 22–32)
Calcium: 9 mg/dL (ref 8.9–10.3)
Chloride: 107 mmol/L (ref 98–111)
Creatinine, Ser: 0.4 mg/dL — ABNORMAL LOW (ref 0.44–1.00)
GFR, Estimated: >60 mL/min (ref >60)
Glucose, Bld: 135 mg/dL — ABNORMAL HIGH (ref 70–99)
Potassium: 3.7 mmol/L (ref 3.5–5.1)
Sodium: 140 mmol/L (ref 135–145)
Total Bilirubin: 0.8 mg/dL (ref 0.3–1.2)
Total Protein: 6.4 g/dL — ABNORMAL LOW (ref 6.5–8.1)

## 2020-01-19 LAB — MAGNESIUM: Magnesium: 1.8 mg/dL (ref 1.7–2.4)

## 2020-01-19 LAB — GLUCOSE, CAPILLARY
Glucose-Capillary: 99 mg/dL (ref 70–99)
Glucose-Capillary: 148 mg/dL — ABNORMAL HIGH (ref 70–99)
Glucose-Capillary: 215 mg/dL — ABNORMAL HIGH (ref 70–99)
Glucose-Capillary: 150 mg/dL — ABNORMAL HIGH (ref 70–99)
Glucose-Capillary: 107 mg/dL — ABNORMAL HIGH (ref 70–99)

## 2020-01-19 LAB — ANTINUCLEAR ANTIBODIES, IFA: ANA Ab, IFA: NEGATIVE

## 2020-01-19 MED ORDER — ACETAMINOPHEN 325 MG PO TABS
650.0000 mg | ORAL_TABLET | Freq: Four times a day (QID) | ORAL | Status: DC | PRN
  Administered 2020-01-19 (×2): 650 mg via ORAL
  Filled 2020-01-19 (×2): qty 2

## 2020-01-19 MED ORDER — BOOST / RESOURCE BREEZE PO LIQD CUSTOM
1.0000 | Freq: Two times a day (BID) | ORAL | Status: DC
  Administered 2020-01-20: 1 via ORAL

## 2020-01-19 MED ORDER — PANCRELIPASE (LIP-PROT-AMYL) 12000-38000 UNITS PO CPEP
36000.0000 [IU] | ORAL_CAPSULE | Freq: Three times a day (TID) | ORAL | Status: DC
  Administered 2020-01-19 – 2020-01-20 (×4): 36000 [IU] via ORAL
  Filled 2020-01-19 (×4): qty 3

## 2020-01-19 MED ORDER — POTASSIUM CHLORIDE CRYS ER 20 MEQ PO TBCR
40.0000 meq | EXTENDED_RELEASE_TABLET | Freq: Once | ORAL | Status: AC
  Administered 2020-01-19: 40 meq via ORAL
  Filled 2020-01-19: qty 2

## 2020-01-19 MED ORDER — ENSURE ENLIVE PO LIQD
237.0000 mL | Freq: Two times a day (BID) | ORAL | Status: DC
  Administered 2020-01-19 – 2020-01-20 (×3): 237 mL via ORAL

## 2020-01-19 NOTE — Progress Notes (Signed)
Nutrition Follow-up  INTERVENTION:   -Ensure Enlive po BID, each supplement provides 350 kcal and 20 grams of protein -Boost Breeze po BID, each supplement provides 250 kcal and 9 grams of protein -Will place Pancreatitis nutrition therapy handout in discharge instructions  NUTRITION DIAGNOSIS:   Increased nutrient needs related to acute illness (acute pancreatitis) as evidenced by estimated needs.  Ongoing.  GOAL:   Patient will meet greater than or equal to 90% of their needs  Progressing.  MONITOR:   PO intake, Supplement acceptance, Labs, Weight trends, I & O's  ASSESSMENT:   64 yo CF with PMH schizoaffective d/c, depression, IBS, prolapsed internal hemorrhoids grade 3, tubular adenoma of colon, diverticulosis who presented with abdominal pain. Admitted for acute pancreatitis.  Patient now on full liquids. Has had difficulty tolerating diet advancements over the weekend. Had N/V yesterday, was put back on clears, now back to fulls.  Pt reports feeling okay today. Had a BM. Pt likes Boost Breeze and is willing to try Ensure today now that diet is advanced. Pt has also been ordered Creon as well per GI.  Admission weight: 162 lbs. Current weight: 164 lbs  Medications: KLOR-CON, CREON Labs reviewed:  CBGs: 148-215  Diet Order:   Diet Order            Diet full liquid Room service appropriate? Yes; Fluid consistency: Thin  Diet effective now                 EDUCATION NEEDS:   Education needs have been addressed  Skin:  Skin Assessment: Reviewed RN Assessment  Last BM:  10/10 -type 2  Height:   Ht Readings from Last 1 Encounters:  01/08/20 5\' 2"  (1.575 m)    Weight:   Wt Readings from Last 1 Encounters:  01/17/20 74.6 kg    BMI:  Body mass index is 30.08 kg/m.  Estimated Nutritional Needs:   Kcal:  1850-2050  Protein:  85-100g  Fluid:  2L/day  Clayton Bibles, MS, RD, LDN Inpatient Clinical Dietitian Contact information available via  Amion

## 2020-01-19 NOTE — Progress Notes (Addendum)
[facility] Gastroenterology Progress Note  [name] y.o. Aug 27, 1955  CC:  Acute pancreatitis  Subjective: Patient once again unable to tolerate soft diet yesterday due to increased pain.  States she is feeling better today and denies any complaints.  ROS : Review of Systems  Cardiovascular: Negative for chest pain and palpitations.  Gastrointestinal: Negative for abdominal pain, blood in stool, constipation, diarrhea, heartburn, melena, nausea and vomiting.   Objective: Vital signs in last 24 hours: Vitals:   01/18/20 2032 01/19/20 0534  BP: 137/74 (!) 135/95  Pulse: 71 67  Resp: 16 18  Temp: 98.1 F (36.7 C) 97.9 F (36.6 C)  SpO2: 94% 95%    Physical Exam:  General:  Sleepy, cooperative, no acute distress  Head:  Normocephalic, without obvious abnormality, atraumatic  Eyes:  Anicteric sclera, EOM's intact,   Lungs:   Clear to auscultation bilaterally, respirations unlabored  Heart:  Regular rate and rhythm, S1, S2 normal  Abdomen:   Soft, non-tender, non-distended, bowel sounds active all four quadrants,  no guarding or peritoneal signs   Extremities: Extremities normal, atraumatic, no  edema  Pulses: 2+ and symmetric    Lab Results: Recent Labs    01/18/20 0651 01/19/20 0605  NA 139 140  K 3.3* 3.7  CL 105 107  CO2 24 23  GLUCOSE 133* 135*  BUN <5* <5*  CREATININE 0.42* 0.40*  CALCIUM 8.9 9.0  MG  --  1.8   Recent Labs    01/18/20 0651 01/19/20 0605  AST 20 23  ALT 17 16  ALKPHOS 68 64  BILITOT 0.8 0.8  PROT 6.1* 6.4*  ALBUMIN 3.0* 3.1*   No results for input(s): WBC, NEUTROABS, HGB, HCT, MCV, PLT in the last 72 hours. No results for input(s): LABPROT, INR in the last 72 hours.    Assessment: Acute pancreatitis: Appears to be idiopathic.  IgG normal, ANA and IgG 4 pending.  Unclear why patient has been unable to tolerate transition to soft diet.  Exam unremarkable, non-tender abdomen today.  Plan: Full liquid diet today. If tolerated,  try re-advancing to soft diet later today or tomorrow.  Trial of Creon 36000u TID prior to meals.  OK to discharge tomorrow from GI standpoint when patient tolerating soft diet.  Patient can follow up with [facility] (she is established with them).     Salley Slaughter PA-C 01/19/2020, 9:51 AM  Contact #  [phone]

## 2020-01-19 NOTE — Care Management Important Message (Signed)
Important Message  Patient Details IM Letter given to the Patient Name: [name] MRN: [id number] Date of Birth: [birth year]   Medicare Important Message Given:  Yes     [name], [name] 01/19/2020, 9:26 AM

## 2020-01-19 NOTE — Progress Notes (Signed)
PROGRESS NOTE    [name]  IWP:[id number] DOB: 05/10/1955 DOA: 01/08/2020 PCP: Bartholome Bill, MD    Brief Narrative:  64 yo CF with PMH schizoaffective d/c, depression, IBS, prolapsed internal hemorrhoids grade 3, tubular adenoma of colon, diverticulosis who presented with abdominal pain. She underwent work-up and was found to have elevated lipase, 1210 and CT findings consistent with uncomplicated pancreatitis with peripancreatic stranding.  She had also not eaten approximately 6 days prior to admission and was n.p.o. for 4 days during hospitalization.  Due to difficulty tolerating trial of clear liquids on 01/12/2020, she underwent repeat CT abdomen/pelvis.  This was notable for showing improved pancreatitis and decreased peripancreatic edema.  No pancreatic necrosis appreciated  Assessment & Plan:   Principal Problem:   Acute pancreatitis Active Problems:   Schizoaffective disorder, depressive type (HCC)   Cigarette nicotine dependence without complication   Depression   Diverticulosis   HTN (hypertension)   Hypophosphatemia   Hypokalemia  Hypokalemia -remains low this AM at 3.7 -Repeat lytes in AM  Hypophosphatemia -recently replaced, currently remains within normal limits -cont to follow lytes in AM  HTN (hypertension) - continue current regimen -BP remains stable and controlled presently  Diverticulosis -without evidence of bleeding noted -Seems stable at this time  Depression -currently stable  Acute pancreatitis - sepsis ruled out after admission -Lipase initially 1210 with CT findings consistent with pancreatitis with peripancreatic edema -Lipase now trended down to normal with recent CT findings on 10/4 indicating much improved pancreatitis -Despite normalization in lipase, pt did not tolerate advancing diet, prompting GI consult -Appreciate input by GI. Creon was started and pt now tolerating diet thus far -Per GI, plan to f/u ANA and IgG    -If pt continues to tolerate diet, possible d/c tomorrow  Cigarette nicotine dependence without complication - Continue nicotine patch  -Will need cessation  Schizoaffective disorder, depressive type (HCC) - Benztropine 1 mg daily; zoloft 100 mg daily; Geodon 80 mgqhs -Affect appears normal at this time  DVT prophylaxis: Lovenox subq Code Status: Full Family Communication: Pt in room, family not at bedside  Status is: Inpatient  Remains inpatient appropriate because:Ongoing active pain requiring inpatient pain management   Dispo: The patient is from: Home              Anticipated d/c is to: Home              Anticipated d/c date is: 1 day              Patient currently is not medically stable to d/c.  Consultants:   [facility]  Procedures:     Antimicrobials: Anti-infectives (From admission, onward)   Start     Dose/Rate Route Frequency Ordered Stop   01/08/20 1600  meropenem (MERREM) 1 g in sodium chloride 0.9 % 100 mL IVPB  Status:  Discontinued        1 g 200 mL/hr over 30 Minutes Intravenous Every 8 hours 01/08/20 1540 01/09/20 1338      Subjective: Was complaining of abd discomfort earlier this AM, but later in the day, staff reports pt tolerated lunch  Objective: Vitals:   01/18/20 1347 01/18/20 2032 01/19/20 0534 01/19/20 1518  BP: (!) 141/73 137/74 (!) 135/95 (!) 119/95  Pulse: 67 71 67 72  Resp:  16 18 15   Temp: 98.8 F (37.1 C) 98.1 F (36.7 C) 97.9 F (36.6 C) 97.8 F (36.6 C)  TempSrc: Oral Oral Oral Oral  SpO2: 95%  94% 95% 95%  Weight:      Height:        Intake/Output Summary (Last 24 hours) at 01/19/2020 1708 Last data filed at 01/19/2020 0604 Gross per 24 hour  Intake 1509.39 ml  Output 600 ml  Net 909.39 ml   Filed Weights   01/15/20 0613 01/16/20 0500 01/17/20 0521  Weight: 75.1 kg 73 kg 74.6 kg    Examination: General exam: Conversant, in no acute distress Respiratory system: normal chest rise, clear, no audible  wheezing Cardiovascular system: regular rhythm, s1-s2 Gastrointestinal system: Nondistended, nontender, pos BS Central nervous system: No seizures, no tremors Extremities: No cyanosis, no joint deformities Skin: No rashes, no pallor Psychiatry: Affect normal // no auditory hallucinations   Data Reviewed: I have personally reviewed following labs and imaging studies  CBC: Recent Labs  Lab 01/13/20 0558 01/14/20 0523 01/15/20 0508 01/16/20 0523  WBC 8.1 9.3 8.0 7.8  NEUTROABS 6.2 6.7 5.4 4.7  HGB 11.1* 11.0* 10.9* 10.7*  HCT 33.1* 33.3* 33.3* 33.9*  MCV 90.9 93.3 93.5 97.4  PLT 161 198 198 761   Basic Metabolic Panel: Recent Labs  Lab 01/13/20 0558 01/13/20 0558 01/14/20 0523 01/14/20 0523 01/15/20 0508 01/16/20 0523 01/17/20 0709 01/18/20 0651 01/19/20 0605  NA 139   < > 135   < > 138 137 140 139 140  K 2.6*   < > 3.1*   < > 3.7 3.6 3.3* 3.3* 3.7  CL 102   < > 101   < > 104 106 108 105 107  CO2 23   < > 25   < > 26 23 25 24 23   GLUCOSE 84   < > 113*   < > 111* 88 121* 133* 135*  BUN 6*   < > <5*   < > <5* <5* <5* <5* <5*  CREATININE 0.44   < > 0.36*   < > 0.43* 0.46 0.36* 0.42* 0.40*  CALCIUM 8.8*   < > 8.7*   < > 8.8* 8.8* 8.7* 8.9 9.0  MG 1.9  --  1.6*  --  2.2 2.1  --   --  1.8  PHOS 2.6  --  2.1*  --  2.8 3.1  --   --   --    < > = values in this interval not displayed.   GFR: Estimated Creatinine Clearance: 67.2 mL/min (A) (by C-G formula based on SCr of 0.4 mg/dL (L)). Liver Function Tests: Recent Labs  Lab 01/15/20 0508 01/16/20 0523 01/17/20 0709 01/18/20 0651 01/19/20 0605  AST 18 20 15 20 23   ALT 17 16 15 17 16   ALKPHOS 62 60 61 68 64  BILITOT 0.7 0.6 0.6 0.8 0.8  PROT 5.9* 5.8* 5.6* 6.1* 6.4*  ALBUMIN 3.0* 2.9* 2.7* 3.0* 3.1*   Recent Labs  Lab 01/15/20 0508 01/16/20 0523 01/17/20 0709  LIPASE 35 29 25   No results for input(s): AMMONIA in the last 168 hours. Coagulation Profile: No results for input(s): INR, PROTIME in the last 168  hours. Cardiac Enzymes: No results for input(s): CKTOTAL, CKMB, CKMBINDEX, TROPONINI in the last 168 hours. BNP (last 3 results) No results for input(s): PROBNP in the last 8760 hours. HbA1C: No results for input(s): HGBA1C in the last 72 hours. CBG: Recent Labs  Lab 01/18/20 2033 01/19/20 0416 01/19/20 0732 01/19/20 1148 01/19/20 1608  GLUCAP 119* 99 148* 215* 150*   Lipid Profile: No results for input(s): CHOL, HDL, LDLCALC, TRIG, CHOLHDL, LDLDIRECT in  the last 72 hours. Thyroid Function Tests: No results for input(s): TSH, T4TOTAL, FREET4, T3FREE, THYROIDAB in the last 72 hours. Anemia Panel: No results for input(s): VITAMINB12, FOLATE, FERRITIN, TIBC, IRON, RETICCTPCT in the last 72 hours. Sepsis Labs: No results for input(s): PROCALCITON, LATICACIDVEN in the last 168 hours.  No results found for this or any previous visit (from the past 240 hour(s)).   Radiology Studies: No results found.  Scheduled Meds: . benztropine  1 mg Oral Daily  . enoxaparin (LOVENOX) injection  40 mg Subcutaneous Q24H  . feeding supplement  1 Container Oral BID BM  . feeding supplement (ENSURE ENLIVE)  237 mL Oral BID BM  . insulin aspart  0-9 Units Subcutaneous Q4H  . lipase/protease/amylase  36,000 Units Oral TID AC  . lisinopril  20 mg Oral Daily  . nicotine  14 mg Transdermal Daily  . pantoprazole  40 mg Oral Daily  . sertraline  100 mg Oral Daily  . ziprasidone  80 mg Oral QHS   Continuous Infusions: . sodium chloride 75 mL/hr at 01/19/20 1443     LOS: 11 days   Marylu Lund, MD [facility] Pager On Amion  If 7PM-7AM, please contact night-coverage 01/19/2020, 5:08 PM

## 2020-01-20 DIAGNOSIS — F17210 Nicotine dependence, cigarettes, uncomplicated: Secondary | ICD-10-CM | POA: Diagnosis not present

## 2020-01-20 DIAGNOSIS — K859 Acute pancreatitis without necrosis or infection, unspecified: Secondary | ICD-10-CM | POA: Diagnosis not present

## 2020-01-20 DIAGNOSIS — K5790 Diverticulosis of intestine, part unspecified, without perforation or abscess without bleeding: Secondary | ICD-10-CM | POA: Diagnosis not present

## 2020-01-20 LAB — COMPREHENSIVE METABOLIC PANEL
ALT: 19 U/L (ref 0–44)
AST: 24 U/L (ref 15–41)
Albumin: 2.7 g/dL — ABNORMAL LOW (ref 3.5–5.0)
Alkaline Phosphatase: 60 U/L (ref 38–126)
Anion gap: 12 (ref 5–15)
BUN: <5 mg/dL — ABNORMAL LOW (ref 8–23)
CO2: 22 mmol/L (ref 22–32)
Calcium: 9.2 mg/dL (ref 8.9–10.3)
Chloride: 110 mmol/L (ref 98–111)
Creatinine, Ser: 0.48 mg/dL (ref 0.44–1.00)
GFR, Estimated: >60 mL/min (ref >60)
Glucose, Bld: 114 mg/dL — ABNORMAL HIGH (ref 70–99)
Potassium: 3.5 mmol/L (ref 3.5–5.1)
Sodium: 144 mmol/L (ref 135–145)
Total Bilirubin: 0.8 mg/dL (ref 0.3–1.2)
Total Protein: 5.7 g/dL — ABNORMAL LOW (ref 6.5–8.1)

## 2020-01-20 LAB — GLUCOSE, CAPILLARY
Glucose-Capillary: 122 mg/dL — ABNORMAL HIGH (ref 70–99)
Glucose-Capillary: 139 mg/dL — ABNORMAL HIGH (ref 70–99)
Glucose-Capillary: 156 mg/dL — ABNORMAL HIGH (ref 70–99)
Glucose-Capillary: 167 mg/dL — ABNORMAL HIGH (ref 70–99)

## 2020-01-20 MED ORDER — TRAMADOL HCL 50 MG PO TABS
50.0000 mg | ORAL_TABLET | Freq: Four times a day (QID) | ORAL | 0 refills | Status: DC | PRN
Start: 2020-01-20 — End: 2020-01-27

## 2020-01-20 MED ORDER — SUCRALFATE 1 GM/10ML PO SUSP: 1.0000 g | Freq: Three times a day (TID) | ORAL | 0 refills | Status: DC

## 2020-01-20 MED ORDER — POTASSIUM CHLORIDE CRYS ER 20 MEQ PO TBCR
60.0000 meq | EXTENDED_RELEASE_TABLET | Freq: Once | ORAL | Status: AC
  Administered 2020-01-20: 60 meq via ORAL
  Filled 2020-01-20: qty 3

## 2020-01-20 MED ORDER — SUCRALFATE 1 GM/10ML PO SUSP
1.0000 g | Freq: Three times a day (TID) | ORAL | Status: DC
  Administered 2020-01-20: 1 g via ORAL
  Filled 2020-01-20: qty 10

## 2020-01-20 MED ORDER — LISINOPRIL 20 MG PO TABS: 20.0000 mg | ORAL_TABLET | Freq: Every day | ORAL | 0 refills | Status: DC

## 2020-01-20 MED ORDER — PANTOPRAZOLE SODIUM 40 MG PO TBEC: 40.0000 mg | DELAYED_RELEASE_TABLET | Freq: Every day | ORAL | 0 refills | Status: DC

## 2020-01-20 MED ORDER — PANCRELIPASE (LIP-PROT-AMYL) 36000-114000 UNITS PO CPEP: 36000.0000 [IU] | ORAL_CAPSULE | Freq: Three times a day (TID) | ORAL | 0 refills | Status: AC

## 2020-01-20 NOTE — Discharge Instructions (Signed)
Pancreatitis Nutrition Therapy  The pancreas is an organ that helps your body digest and absorb nutrients in food. When you have pancreatitis, your body may not be able to digest food well. The fat in food is especially hard for your body to digest and may cause pain. This nutrition therapy limits the fat in your diet while providing nutrients you need.   Eat nonfat or low-fat foods that meet the [place] Dietary Guidelines. For most adults, goals should be:  Grains: 5 to 6 servings (1 ounce) each day (1 ounce = 1 slice bread, 1 cup breakfast cereal, or  cup cooked pasta or rice).  Vegetables: 2 to 3 cups each day. Eat a variety of vegetables, especially dark-green and red and orange vegetables and beans and peas.  Fruits: 2 cups each day.  Protein foods (meat, poultry, fish, and beans): 5 to 6 ounces each day (1 egg or  cup beans counts as 1 ounce)  Milk and dairy foods: 3 cups each day (1 ounces cheese count as 1 cup milk)  Keep the total amount of fat that you eat to 25% to 30% of the calories that you eat. If your recommended intake is to eat 2,000 calories per day, your fat intake can be 55 to 65 grams per day.  Eating small, low fat meals and drinking enough fluids may improve your pain. Choosing soft foods can also help decrease discomfort from pancreatitis.   It can be hard to maintain your weight on a low-fat diet. Let your doctor or registered dietitian nutritionist (RDN) know if you lose weight while on this diet.  Do not drink alcoholic beverages.  Talk with an RDN to figure out which foods are best for you and to get answers to any questions you might have about your nutrition therapy. Ask the RDN for recommendations for other conditions you have, such as diabetes mellitus.  If prescribed, take pancreatic enzymes before each meal or as directed by your doctor. If pancreatic enzymes are prescribed, a low-fat diet may no longer be needed.  Foods Not Recommended Food  Group Foods Not Recommended Grains             Products made with added fat (such as biscuits, waffles, and regular crackers)                         High-fat bakery products such as doughnuts, biscuits, croissants, Danish pastries, pies,                                   cookies                         Snacks made with partially hydrogenated oils including chips, cheese puffs, snack mixes,                                  regular crackers, butter-flavored popcorn Fruits and Vegetables    Fried fruits or vegetables                                Fruit served with butter or cream  Vegetables prepared with butter, cheese, or cream sauce Protein Foods (Meat, Poultry, Fish, Beans) Higher-fat cuts of meats (ribs, T-bone steak, regular hamburger)                          Berniece Salines, Sausage, Cold cuts such as salami and bologna, corned beef, hot dogs                          Organ meats (liver, brains, sweetbreads)                           Poultry with skin                           Fried meat, poultry, and fish                          Tree nuts and peanuts  * Note: Smooth nut butters may be eaten in place of other fats, staying at or below the 6 servings per day limit of all fats and oils. Check food labels of nut butters to find the amount that can be substituted for 1 teaspoon of butter, margarine and other fats (1 teaspoon = 5 grams of fat).  Whole eggs and egg yolks Dairy                       Whole milk, Reduced-fat (2%) milk, Whole milk yogurt, Whole milk ice cream                                    Cream, Half-and-half, Cream cheese, Sour cream, Cheese Fats and Oils            Limit fats and oils to 6 teaspoons or less per day.                                   Butter, stick margarine, shortening, partially hydrogenated oils, tropical oils (coconut,                                         palm, and palm kernel oils)                                  *Avocado (up to 1 ounce)  can also be substituted for 1 teaspoon of butter margarine and                                    other fats Beverages           Alcohol, wine, beer, liquors Desserts          Any desserts, pies, and cookies made with whole milk, eggs, and added fat  Copyright: Academy of [employer]etics

## 2020-01-20 NOTE — Discharge Summary (Signed)
Physician Discharge Summary  [name] DTO:[id number] DOB: 08/18/1955 DOA: 01/08/2020  PCP: Bartholome Bill, MD  Admit date: 01/08/2020 Discharge date: 01/20/2020  Admitted From: Home Disposition:  Home  Recommendations for Outpatient Follow-up:  1. Follow up with PCP in 2-3 weeks 2. Follow up primary Gastroenterologist in 2-3 weeks  Discharge Condition:Improved CODE STATUS:Full Diet recommendation: Soft, low fat   Brief/Interim Summary: 64 yo CF with PMH schizoaffective d/c, depression, IBS, prolapsed internal hemorrhoids grade 3, tubular adenoma of colon, diverticulosis who presented with abdominal pain. She underwent work-up and was found to have elevated lipase, 1210 and CT findings consistent with uncomplicated pancreatitis with peripancreatic stranding. She had also not eaten approximately 6 days prior to admission and was n.p.o. for 4 days during hospitalization. Due to difficulty tolerating trial of clear liquids on 01/12/2020, she underwent repeat CT abdomen/pelvis. This was notable for showing improved pancreatitis and decreased peripancreatic edema. No pancreatic necrosis appreciated  Discharge Diagnoses:  Principal Problem:   Acute pancreatitis Active Problems:   Schizoaffective disorder, depressive type (HCC)   Cigarette nicotine dependence without complication   Depression   Diverticulosis   HTN (hypertension)   Hypophosphatemia   Hypokalemia  Hypokalemia -Corrected  Hypophosphatemia -recently replaced, currently remains within normal limits -Encourage PO as tolerated  HTN (hypertension) - continue current regimen of lisinopril -BP remains stable and controlled presently  Diverticulosis -without evidence of bleeding noted -Seems stable at this time  Depression -currently stable  Acute pancreatitis - sepsis ruled out after admission -Lipase initially 1210 with CT findings consistent with pancreatitis with peripancreatic  edema -Lipase now trended down to normal with recent CT findings on 10/4 indicating much improved pancreatitis -Despite normalization in lipase, pt did not tolerate advancing diet, prompting GI consult -Appreciate input by GI. Creon was started and pt now tolerating diet thus far -Per GI, plan to f/u ANA and IgG as outpatient -Discussed with GI. OK to d/c home today with Creon, carafate, PPI -Will provide limited quantity of ultram. NCCSR reviewed  Cigarette nicotine dependence without complication - Continued nicotine patchwhile in hospital -Recommend cessation  Schizoaffective disorder, depressive type ([place]) -Benztropine 1 mg daily; zoloft 100 mg daily;Geodon 80 mgqhs -Affect appears normal at this time  Discharge Instructions   Allergies as of 01/20/2020      Reactions   Penicillins    Did it involve swelling of the face/tongue/throat, SOB, or low BP? N Did it involve sudden or severe rash/hives, skin peeling, or any reaction on the inside of your mouth or nose? Y Did you need to seek medical attention at a hospital or doctor's office? N When did it last happen?Several Years Ago If all above answers are "NO", may proceed with cephalosporin use.   Sulfa Antibiotics Rash      Medication List    STOP taking these medications   meloxicam 15 MG tablet Commonly known as: MOBIC   omeprazole 40 MG capsule Commonly known as: PRILOSEC   ondansetron 4 MG disintegrating tablet Commonly known as: ZOFRAN-ODT     TAKE these medications   benztropine 1 MG tablet Commonly known as: COGENTIN Take 1 tablet (1 mg total) by mouth daily.   clobetasol 0.05 % external solution Commonly known as: TEMOVATE Apply 1 application topically 2 (two) times daily.   cyclobenzaprine 5 MG tablet Commonly known as: FLEXERIL Take 5 mg by mouth 3 (three) times daily as needed for muscle spasms.   Fluocinolone Acetonide Scalp 0.01 % Oil Apply 1 application topically at  bedtime.    fluocinonide cream 0.05 % Commonly known as: LIDEX Apply topically 3 (three) times daily.   lipase/protease/amylase 36000 UNITS Cpep capsule Commonly known as: CREON Take 1 capsule (36,000 Units total) by mouth 3 (three) times daily before meals.   lisinopril 20 MG tablet Commonly known as: ZESTRIL Take 1 tablet (20 mg total) by mouth daily. Start taking on: January 21, 2020   ondansetron 4 MG tablet Commonly known as: ZOFRAN Take 4 mg by mouth every 8 (eight) hours as needed for nausea or vomiting.   pantoprazole 40 MG tablet Commonly known as: PROTONIX Take 1 tablet (40 mg total) by mouth daily. Start taking on: January 21, 2020   sertraline 100 MG tablet Commonly known as: ZOLOFT Take 1 tablet (100 mg total) by mouth daily. What changed:   how much to take  additional instructions  Another medication with the same name was removed. Continue taking this medication, and follow the directions you see here.   sucralfate 1 GM/10ML suspension Commonly known as: CARAFATE Take 10 mLs (1 g total) by mouth 4 (four) times daily -  with meals and at bedtime.   traMADol 50 MG tablet Commonly known as: ULTRAM Take 1 tablet (50 mg total) by mouth every 6 (six) hours as needed for severe pain. What changed:   when to take this  reasons to take this   traZODone 50 MG tablet Commonly known as: DESYREL Take 0.5 tablets (25 mg total) by mouth at bedtime as needed for sleep. What changed:   how much to take  when to take this  reasons to take this  additional instructions  Another medication with the same name was removed. Continue taking this medication, and follow the directions you see here.   Vitamin D (Ergocalciferol) 1.25 MG (50000 UNIT) Caps capsule Commonly known as: DRISDOL Take 50,000 Units by mouth once a week.   ziprasidone 80 MG capsule Commonly known as: GEODON Take 1 capsule (80 mg total) by mouth at bedtime.       Follow-up Information    Bartholome Bill, MD. Schedule an appointment as soon as possible for a visit in 2 week(s).   Specialty: Family Medicine Contact information: [place] [address] [phone]        Follow up with your primary Gastroenterologist. Schedule an appointment as soon as possible for a visit in 2 week(s).              Allergies  Allergen Reactions  . Penicillins     Did it involve swelling of the face/tongue/throat, SOB, or low BP? N Did it involve sudden or severe rash/hives, skin peeling, or any reaction on the inside of your mouth or nose? Y Did you need to seek medical attention at a hospital or doctor's office? N When did it last happen?Several Years Ago If all above answers are "NO", may proceed with cephalosporin use.     . Sulfa Antibiotics Rash    Consultations:  GI  Procedures/Studies: CT Head Wo Contrast  Result Date: 01/08/2020 CLINICAL DATA:  Mental status change extreme lethargy and weakness EXAM: CT HEAD WITHOUT CONTRAST TECHNIQUE: Contiguous axial images were obtained from the base of the skull through the vertex without intravenous contrast. COMPARISON:  None. FINDINGS: Brain: No acute territorial infarction, hemorrhage or intracranial mass. Minimal white matter hypodensity on the right. Nonenlarged ventricles. Vascular: No hyperdense vessels.  No unexpected calcification. Skull: Normal. Negative for fracture  or focal lesion. Sinuses/Orbits: No acute finding. Other: None IMPRESSION: 1. No CT evidence for acute intracranial abnormality. 2. Minimal white matter hypodensity on the right, may reflect small vessel ischemic change. Electronically Signed   By: Donavan Foil M.D.   On: 01/08/2020 16:35   CT ABDOMEN PELVIS W CONTRAST  Result Date: 01/12/2020 CLINICAL DATA:  Persistent pancreatitis question pancreatic necrosis EXAM: CT ABDOMEN AND PELVIS WITH CONTRAST TECHNIQUE: Multidetector CT imaging of the abdomen and  pelvis was performed using the standard protocol following bolus administration of intravenous contrast. Sagittal and coronal MPR images reconstructed from axial data set. CONTRAST:  11mL OMNIPAQUE IOHEXOL 300 MG/ML SOLN IV. No oral contrast. COMPARISON:  01/08/2020 FINDINGS: Lower chest: Linear subsegmental atelectasis LEFT lower lobe. Tiny LEFT pleural effusion. Hepatobiliary: Gallbladder surgically absent. Liver normal appearance. No biliary dilatation. Pancreas: Improved changes of acute pancreatitis with significantly decreased peripancreatic edema. Slight fatty replacement at pancreatic body/head unchanged. No focal pancreatic mass or necrosis. No abnormal pancreatic fluid collections, calcifications or ductal dilatation. Spleen: Normal appearance. 2.7 cm diameter splenule medial to spleen. Adrenals/Urinary Tract: Adrenal glands and RIGHT kidney normal appearance. Large cyst inferior pole LEFT kidney measuring 7.0 x 6.1 x 6.1 cm. No urinary tract calcification or dilatation. Bladder and ureters unremarkable. Stomach/Bowel: Normal appendix. Minimal sigmoid diverticulosis without evidence of diverticulitis. Stomach and bowel loops otherwise normal appearance. Vascular/Lymphatic: Atherosclerotic calcifications aorta iliac arteries without aneurysm. Aorta normal caliber. No adenopathy. Reproductive: Unremarkable uterus and adnexa Other: No free air or free fluid. No hernia or in pneumothorax process. Musculoskeletal: Bones demineralized. Probable vertebral hemangioma L1. IMPRESSION: Improved changes of acute pancreatitis with significantly decreased peripancreatic edema. Large LEFT renal cyst 7.0 x 6.1 x 6.1 cm. Minimal sigmoid diverticulosis without evidence of diverticulitis. Aortic Atherosclerosis (ICD10-I70.0). Electronically Signed   By: Lavonia Dana M.D.   On: 01/12/2020 14:54   DG Chest Port 1 View  Result Date: 01/08/2020 CLINICAL DATA:  uncle called EMS out today complaining of extreme lethargy and  weakness for 6 days. EXAM: PORTABLE CHEST 1 VIEW.  AP portable semi erect.  Patient is rotated COMPARISON:  None. FINDINGS: The heart size and mediastinal contours are within normal limits. No focal consolidation. No pulmonary edema. No pleural effusion. No pneumothorax. No acute osseous abnormality. IMPRESSION: No active disease. Electronically Signed   By: Iven Finn M.D.   On: 01/08/2020 15:53   CT Angio Chest/Abd/Pel for Dissection W and/or W/WO  Result Date: 01/08/2020 CLINICAL DATA:  Chest and abdominal pain. Evaluate for thoracic/abdominal dissection. EXAM: CT ANGIOGRAPHY CHEST, ABDOMEN AND PELVIS TECHNIQUE: Non-contrast CT of the chest was initially obtained. Multidetector CT imaging through the chest, abdomen and pelvis was performed using the standard protocol during bolus administration of intravenous contrast. Multiplanar reconstructed images and MIPs were obtained and reviewed to evaluate the vascular anatomy. CONTRAST:  114mL OMNIPAQUE IOHEXOL 350 MG/ML SOLN COMPARISON:  CT abdomen and pelvis - 05/02/2019 FINDINGS: CTA CHEST FINDINGS Vascular Findings: Evaluation of the aortic root and proximal aspect of the ascending thoracic aorta is degraded secondary to pulsation artifact. Given this limitation, there is no evidence of thoracic aortic aneurysm or dissection with measurements as follows. Review of the precontrast images is negative for the presence of an intramural hematoma. Atherosclerotic plaque involving the aortic arch and distal aspect of the descending thoracic aorta, not resulting in hemodynamically significant stenosis. Bovine configuration of the aortic arch. The branch vessels of the aortic arch appear widely patent throughout their imaged courses. Normal heart size.  No  pericardial effusion. Although this examination was not tailored for the evaluation the pulmonary arteries, there are no discrete filling defects within the central pulmonary arterial tree to suggest central  pulmonary embolism. Normal caliber of the main pulmonary artery. ------------------------------------------------------------- Thoracic aortic measurements: Sinotubular junction 26 mm as measured in greatest oblique short axis coronal dimension. Proximal ascending aorta 38 mm in greatest oblique short axis axial diameter (axial image 29, series 6); 36 mm in greatest oblique short axis coronal diameter (coronal image 92, series 4). Aortic arch aorta 32 mm as measured in greatest oblique short axis sagittal dimension. Proximal descending thoracic aorta 28 mm as measured in greatest oblique short axis axial dimension at the level of the main pulmonary artery. Distal descending thoracic aorta 23 mm as measured in greatest oblique short axis axial dimension at the level of the diaphragmatic hiatus. Review of the MIP images confirms the above findings. ------------------------------------------------------------- Non-Vascular Findings: Mediastinum/Lymph Nodes: No bulky mediastinal, hilar or axillary lymphadenopathy. Lungs/Pleura: Minimal subsegmental atelectasis within the left lower lobe. No discrete focal airspace opacities. No pleural effusion or pneumothorax. The central pulmonary airways appear widely patent. No discrete pulmonary nodules. Musculoskeletal: No acute or aggressive osseous abnormalities. Regional soft tissues appear normal. The thyroid appears somewhat atrophic but without discrete nodule. _________________________________________________________ _________________________________________________________ CTA ABDOMEN AND PELVIS FINDINGS VASCULAR Aorta: Moderate amount of eccentric predominantly calcified atherosclerotic plaque within a normal caliber abdominal aorta, not resulting in a hemodynamically significant stenosis. No evidence of abdominal aortic dissection or periaortic stranding. Celiac: Widely patent without hemodynamically significant narrowing. Conventional branching pattern. 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 SMA: Widely patent without a hemodynamically significant narrowing. Conventional branching pattern. The distal tributaries of the SMA are widely patent without discrete lumen filling defect to suggest distal embolism. Renals: Note is made of a tiny accessory left renal artery which supplies the inferior pole the left kidney. Solitary right renal artery. There is a moderate amount of eccentric predominantly calcified atherosclerotic plaque involving the origin and proximal aspects of the bilateral renal arteries, not resulting in a hemodynamically significant stenosis. No vessel irregularity to suggest FMD. IMA: Remains patent. Inflow: Minimal amount of eccentric mixed calcified and noncalcified atherosclerotic plaque involves the bilateral normal caliber common iliac arteries, not resulting in hemodynamically significant stenosis. The bilateral internal iliac arteries are mildly disease though patent and of normal caliber. The bilateral external iliac arteries are tortuous but widely patent without hemodynamically significant narrowing. Veins: The IVC and pelvic venous systems appear patent on this arterial phase examination. Review of the MIP images confirms the above findings. _________________________________________________________ Evaluation of abdominal organs is limited to the arterial phase of enhancement. NON-VASCULAR Hepatobiliary: Normal hepatic contour. There is diffuse decreased attenuation hepatic parenchyma suggestive of hepatic steatosis. No discrete hyperenhancing hepatic lesions. Post cholecystectomy. No intra or extrahepatic biliary ductal dilatation. No ascites. Pancreas: There is rather extensive  inflammatory change about the pancreatic bed without definable/drainable fluid collection/pseudocyst. There is apparent homogeneous enhancement of the pancreatic parenchyma without definitive evidence of pancreatic necrosis. Suspected minimal amount of vasospasm involving the distal aspect of the splenic artery however both the splenic artery and vein remain patent. Spleen: Normal appearance of the spleen. Note is made of a splenule about the anterior inferior aspect of the  spleen. Adrenals/Urinary Tract: Homogeneous enhancement of the bilateral kidneys. Note is made of an approximately 6.8 cm hypoattenuating partially exophytic cyst arising from the inferior pole of the left kidney. No discrete right-sided renal lesions. No definite evidence of nephrolithiasis on this postcontrast examination. No urinary obstruction. Normal appearance of the bilateral adrenal glands. Normal appearance of the urinary bladder given degree of distention. Stomach/Bowel: Moderate colonic stool burden without evidence of enteric obstruction. No discrete areas of bowel wall thickening. Normal appearance of the terminal ileum and appendix. No pneumoperitoneum, pneumatosis or portal venous gas. Lymphatic: No bulky retroperitoneal, mesenteric, pelvic or inguinal lymphadenopathy. Reproductive: Normal appearance of the pelvic organs for age. No discrete adnexal lesion. No free fluid the pelvic cul-de-sac. Other: Minimal amount of subcutaneous edema about the midline of the low back. Musculoskeletal: No acute or aggressive osseous abnormalities. Moderate to severe multilevel lumbar spine DDD, worse at L3-L4 and L5-S1 with disc space height loss, endplate irregularity and sclerosis. Small posteriorly directed disc osteophyte complex is also seen at T11-T12. Review of the MIP images confirms the above findings. IMPRESSION: Chest CTA impression: 1. No acute cardiopulmonary disease. Specifically, no evidence of thoracic aortic aneurysm or  dissection on this motion degraded examination. Abdomen and pelvic CTA impression: 1. Findings suggestive of acute uncomplicated pancreatitis with moderate to large amount of peripancreatic stranding, but without definable/drainable fluid collection/pseudocyst and no definitive evidence of pancreatic necrosis. 2. Post cholecystectomy. 3. Scattered atherosclerotic plaque within a normal caliber abdominal aorta. Aortic Atherosclerosis (ICD10-I70.0). 4. 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 Electronically Signed   By: Sandi Mariscal M.D.   On: 01/08/2020 17:11   [place] EKG SITE RITE  Result Date: 01/12/2020 If Site Rite image not attached, placement could not be confirmed due to current cardiac rhythm.   Subjective: Tolerating soft diet. Eager to go home  Discharge Exam: Vitals:   01/19/20 2043 01/20/20 0525  BP: (!) 152/77 124/66  Pulse: 70 65  Resp: 18 17  Temp: 98.4 F (36.9 C) 98.2 F (36.8 C)  SpO2: 95% 92%   Vitals:   01/19/20 0534 01/19/20 1518 01/19/20 2043 01/20/20 0525  BP: (!) 135/95 (!) 119/95 (!) 152/77 124/66  Pulse: 67 72 70 65  Resp: 18 15 18 17   Temp: 97.9 F (36.6 C) 97.8 F (36.6 C) 98.4 F (36.9 C) 98.2 F (36.8 C)  TempSrc: Oral Oral Oral Oral  SpO2: 95% 95% 95% 92%  Weight:      Height:        General: Pt is alert, awake, not in acute distress Cardiovascular: RRR, S1/S2 +, no rubs, no gallops Respiratory: CTA bilaterally, no wheezing, no rhonchi Abdominal: Soft, NT, ND, bowel sounds + Extremities: no edema, no cyanosis   The results of significant diagnostics from this hospitalization (including imaging, microbiology, ancillary and laboratory) are listed below for reference.     Microbiology: No results found for this or any previous visit (from the past 240 hour(s)).   Labs: BNP (last 3  results) Recent Labs    01/08/20 1450  BNP 40.1   Basic Metabolic Panel: Recent Labs  Lab 01/14/20 0523 01/14/20 0523 01/15/20 0508 01/15/20 0508 01/16/20 0523 01/17/20 0709 01/18/20 0651 01/19/20 0605 01/20/20 0603  NA 135   < > 138   < > 137 140 139 140 144  K 3.1*   < > 3.7   < >  3.6 3.3* 3.3* 3.7 3.5  CL 101   < > 104   < > 106 108 105 107 110  CO2 25   < > 26   < > 23 25 24 23 22   GLUCOSE 113*   < > 111*   < > 88 121* 133* 135* 114*  BUN <5*   < > <5*   < > <5* <5* <5* <5* <5*  CREATININE 0.36*   < > 0.43*   < > 0.46 0.36* 0.42* 0.40* 0.48  CALCIUM 8.7*   < > 8.8*   < > 8.8* 8.7* 8.9 9.0 9.2  MG 1.6*  --  2.2  --  2.1  --   --  1.8  --   PHOS 2.1*  --  2.8  --  3.1  --   --   --   --    < > = values in this interval not displayed.   Liver Function Tests: Recent Labs  Lab 01/16/20 0523 01/17/20 0709 01/18/20 0651 01/19/20 0605 01/20/20 0603  AST 20 15 20 23 24   ALT 16 15 17 16 19   ALKPHOS 60 61 68 64 60  BILITOT 0.6 0.6 0.8 0.8 0.8  PROT 5.8* 5.6* 6.1* 6.4* 5.7*  ALBUMIN 2.9* 2.7* 3.0* 3.1* 2.7*   Recent Labs  Lab 01/15/20 0508 01/16/20 0523 01/17/20 0709  LIPASE 35 29 25   No results for input(s): AMMONIA in the last 168 hours. CBC: Recent Labs  Lab 01/14/20 0523 01/15/20 0508 01/16/20 0523  WBC 9.3 8.0 7.8  NEUTROABS 6.7 5.4 4.7  HGB 11.0* 10.9* 10.7*  HCT 33.3* 33.3* 33.9*  MCV 93.3 93.5 97.4  PLT 198 198 211   Cardiac Enzymes: No results for input(s): CKTOTAL, CKMB, CKMBINDEX, TROPONINI in the last 168 hours. BNP: Invalid input(s): POCBNP CBG: Recent Labs  Lab 01/19/20 2044 01/20/20 0014 01/20/20 0527 01/20/20 0737 01/20/20 1207  GLUCAP 107* 139* 156* 122* 167*   D-Dimer No results for input(s): DDIMER in the last 72 hours. Hgb A1c No results for input(s): HGBA1C in the last 72 hours. Lipid Profile No results for input(s): CHOL, HDL, LDLCALC, TRIG, CHOLHDL, LDLDIRECT in the last 72 hours. Thyroid function studies No  results for input(s): TSH, T4TOTAL, T3FREE, THYROIDAB in the last 72 hours.  Invalid input(s): FREET3 Anemia work up No results for input(s): VITAMINB12, FOLATE, FERRITIN, TIBC, IRON, RETICCTPCT in the last 72 hours. Urinalysis    Component Value Date/Time   COLORURINE YELLOW 01/08/2020 1802   APPEARANCEUR CLEAR 01/08/2020 1802   LABSPEC 1.016 01/08/2020 1802   PHURINE 5.0 01/08/2020 1802   GLUCOSEU NEGATIVE 01/08/2020 1802   HGBUR SMALL (A) 01/08/2020 1802   BILIRUBINUR NEGATIVE 01/08/2020 1802   KETONESUR 80 (A) 01/08/2020 1802   PROTEINUR 100 (A) 01/08/2020 1802   UROBILINOGEN 1.0 09/14/2008 2239   NITRITE NEGATIVE 01/08/2020 1802   LEUKOCYTESUR NEGATIVE 01/08/2020 1802   Sepsis Labs Invalid input(s): PROCALCITONIN,  WBC,  LACTICIDVEN Microbiology No results found for this or any previous visit (from the past 240 hour(s)).  Time spent: 30 min  SIGNED:   Marylu Lund, MD  [facility] 01/20/2020, 2:53 PM  If 7PM-7AM, please contact night-coverage

## 2020-01-20 NOTE — Progress Notes (Signed)
[facility] Gastroenterology Progress Note  [name] 64 y.o. Apr 14, 1955  CC: Pancreatitis   Subjective: Patient seen and examined at bedside.  Feeling better.  Tolerating liquid diet.  Had a bowel movement today.  Continues to have nausea but denies any vomiting.  ROS : Afebrile, negative for chest pain   Objective: Vital signs in last 24 hours: Vitals:   01/19/20 2043 01/20/20 0525  BP: (!) 152/77 124/66  Pulse: 70 65  Resp: 18 17  Temp: 98.4 F (36.9 C) 98.2 F (36.8 C)  SpO2: 95% 92%    Physical Exam:  General:  Alert, cooperative, no distress, appears stated age  Head:  Normocephalic, without obvious abnormality, atraumatic  Eyes:  , EOM's intact,   Lungs:   Clear to auscultation bilaterally, respirations unlabored  Heart:  Regular rate and rhythm, S1, S2 normal  Abdomen:   Soft, mild epigastric discomfort on palpation, bowel sounds present.  No peritoneal signs  Extremities: Extremities normal, atraumatic, no  edema       Lab Results: Recent Labs    01/19/20 0605 01/20/20 0603  NA 140 144  K 3.7 3.5  CL 107 110  CO2 23 22  GLUCOSE 135* 114*  BUN <5* <5*  CREATININE 0.40* 0.48  CALCIUM 9.0 9.2  MG 1.8  --    Recent Labs    01/19/20 0605 01/20/20 0603  AST 23 24  ALT 16 19  ALKPHOS 64 60  BILITOT 0.8 0.8  PROT 6.4* 5.7*  ALBUMIN 3.1* 2.7*   No results for input(s): WBC, NEUTROABS, HGB, HCT, MCV, PLT in the last 72 hours. No results for input(s): LABPROT, INR in the last 72 hours.    Assessment/Plan: Assessment --------------- -Acute pancreatitis.  She is status post cholecystectomy.  Normal triglycerides.  Normal total IgG.  Repeat CT scan showed improvement in pancreatitis.  Recommendations --------------------------- -Continue Creon. -Add Carafate -Advance diet as tolerated -If tolerating diet, okay to discharge  from GI standpoint with follow-up with primary GI at [place] will sign off.  Call us back if  needed    Otis Brace MD, [place] 01/20/2020, 10:13 AM  Contact #  [phone]

## 2020-01-20 NOTE — TOC Benefit Eligibility Note (Signed)
Transition of Care [facility]) Benefit Eligibility Note    Patient Details  Name: [name] MRN: [id number] Date of Birth: 1956/02/18               [facility] with Person/Company/Phone Number:: Tina/ Jackquline Denmark [phone]           Additional Notes: Patients policy expired 6/38/4665    Kerin Salen Phone Number: 01/20/2020, 10:59 AM

## 2020-01-21 LAB — IGG 4: IgG, Subclass 4: 83 mg/dL (ref 2–96)

## 2020-01-23 ENCOUNTER — Encounter (HOSPITAL_COMMUNITY): Payer: Self-pay | Admitting: Emergency Medicine

## 2020-01-23 ENCOUNTER — Encounter (HOSPITAL_COMMUNITY): Payer: Self-pay | Admitting: Anesthesiology

## 2020-01-23 ENCOUNTER — Emergency Department (HOSPITAL_COMMUNITY): Payer: Medicare Other

## 2020-01-23 ENCOUNTER — Inpatient Hospital Stay (HOSPITAL_COMMUNITY)
Admission: EM | Admit: 2020-01-23 | Discharge: 2020-01-27 | DRG: 482 | Disposition: A | Discharge status: Home / Self Care | Payer: Medicare Other | Attending: Family Medicine | Admitting: Family Medicine

## 2020-01-23 DIAGNOSIS — F32A Depression, unspecified: Secondary | ICD-10-CM | POA: Diagnosis present

## 2020-01-23 DIAGNOSIS — R52 Pain, unspecified: Secondary | ICD-10-CM

## 2020-01-23 DIAGNOSIS — E876 Hypokalemia: Secondary | ICD-10-CM | POA: Diagnosis not present

## 2020-01-23 DIAGNOSIS — S72002A Fracture of unspecified part of neck of left femur, initial encounter for closed fracture: Secondary | ICD-10-CM | POA: Diagnosis not present

## 2020-01-23 DIAGNOSIS — Z882 Allergy status to sulfonamides status: Secondary | ICD-10-CM

## 2020-01-23 DIAGNOSIS — D649 Anemia, unspecified: Secondary | ICD-10-CM | POA: Diagnosis present

## 2020-01-23 DIAGNOSIS — I952 Hypotension due to drugs: Secondary | ICD-10-CM | POA: Diagnosis not present

## 2020-01-23 DIAGNOSIS — Y92009 Unspecified place in unspecified non-institutional (private) residence as the place of occurrence of the external cause: Secondary | ICD-10-CM

## 2020-01-23 DIAGNOSIS — Z87891 Personal history of nicotine dependence: Secondary | ICD-10-CM

## 2020-01-23 DIAGNOSIS — I959 Hypotension, unspecified: Secondary | ICD-10-CM | POA: Diagnosis not present

## 2020-01-23 DIAGNOSIS — Z791 Long term (current) use of non-steroidal anti-inflammatories (NSAID): Secondary | ICD-10-CM

## 2020-01-23 DIAGNOSIS — Z9049 Acquired absence of other specified parts of digestive tract: Secondary | ICD-10-CM

## 2020-01-23 DIAGNOSIS — W1830XA Fall on same level, unspecified, initial encounter: Secondary | ICD-10-CM | POA: Diagnosis present

## 2020-01-23 DIAGNOSIS — K589 Irritable bowel syndrome without diarrhea: Secondary | ICD-10-CM | POA: Diagnosis present

## 2020-01-23 DIAGNOSIS — I10 Essential (primary) hypertension: Secondary | ICD-10-CM | POA: Diagnosis present

## 2020-01-23 DIAGNOSIS — Z818 Family history of other mental and behavioral disorders: Secondary | ICD-10-CM | POA: Diagnosis not present

## 2020-01-23 DIAGNOSIS — M25552 Pain in left hip: Secondary | ICD-10-CM | POA: Diagnosis present

## 2020-01-23 DIAGNOSIS — Z880 Allergy status to penicillin: Secondary | ICD-10-CM | POA: Diagnosis not present

## 2020-01-23 DIAGNOSIS — K219 Gastro-esophageal reflux disease without esophagitis: Secondary | ICD-10-CM | POA: Diagnosis present

## 2020-01-23 DIAGNOSIS — Z79899 Other long term (current) drug therapy: Secondary | ICD-10-CM

## 2020-01-23 DIAGNOSIS — W010XXA Fall on same level from slipping, tripping and stumbling without subsequent striking against object, initial encounter: Secondary | ICD-10-CM | POA: Diagnosis present

## 2020-01-23 DIAGNOSIS — S72142A Displaced intertrochanteric fracture of left femur, initial encounter for closed fracture: Secondary | ICD-10-CM | POA: Diagnosis present

## 2020-01-23 DIAGNOSIS — M1990 Unspecified osteoarthritis, unspecified site: Secondary | ICD-10-CM | POA: Diagnosis present

## 2020-01-23 DIAGNOSIS — F251 Schizoaffective disorder, depressive type: Secondary | ICD-10-CM | POA: Diagnosis present

## 2020-01-23 DIAGNOSIS — Z20822 Contact with and (suspected) exposure to covid-19: Secondary | ICD-10-CM | POA: Diagnosis present

## 2020-01-23 DIAGNOSIS — S72009A Fracture of unspecified part of neck of unspecified femur, initial encounter for closed fracture: Secondary | ICD-10-CM | POA: Diagnosis present

## 2020-01-23 LAB — RESPIRATORY PANEL BY RT PCR (FLU A&B, COVID)
Influenza A by PCR: NEGATIVE
Influenza B by PCR: NEGATIVE
SARS Coronavirus 2 by RT PCR: NEGATIVE

## 2020-01-23 LAB — PROTIME-INR
INR: 1.4 — ABNORMAL HIGH (ref 0.8–1.2)
Prothrombin Time: 16.4 seconds — ABNORMAL HIGH (ref 11.4–15.2)

## 2020-01-23 LAB — COMPREHENSIVE METABOLIC PANEL
ALT: 22 U/L (ref 0–44)
AST: 30 U/L (ref 15–41)
Albumin: 3.4 g/dL — ABNORMAL LOW (ref 3.5–5.0)
Alkaline Phosphatase: 77 U/L (ref 38–126)
Anion gap: 15 (ref 5–15)
BUN: 7 mg/dL — ABNORMAL LOW (ref 8–23)
CO2: 20 mmol/L — ABNORMAL LOW (ref 22–32)
Calcium: 9.1 mg/dL (ref 8.9–10.3)
Chloride: 103 mmol/L (ref 98–111)
Creatinine, Ser: 0.51 mg/dL (ref 0.44–1.00)
GFR, Estimated: >60 mL/min (ref >60)
Glucose, Bld: 112 mg/dL — ABNORMAL HIGH (ref 70–99)
Potassium: 3.3 mmol/L — ABNORMAL LOW (ref 3.5–5.1)
Sodium: 138 mmol/L (ref 135–145)
Total Bilirubin: 1.5 mg/dL — ABNORMAL HIGH (ref 0.3–1.2)
Total Protein: 6.6 g/dL (ref 6.5–8.1)

## 2020-01-23 LAB — CBC WITH DIFFERENTIAL/PLATELET
Abs Immature Granulocytes: 0.07 10*3/uL (ref 0.00–0.07)
Basophils Absolute: 0 10*3/uL (ref 0.0–0.1)
Basophils Relative: 0 %
Eosinophils Absolute: 0 10*3/uL (ref 0.0–0.5)
Eosinophils Relative: 0 %
HCT: 32.2 % — ABNORMAL LOW (ref 36.0–46.0)
Hemoglobin: 10.5 g/dL — ABNORMAL LOW (ref 12.0–15.0)
Immature Granulocytes: 1 %
Lymphocytes Relative: 4 %
Lymphs Abs: 0.4 10*3/uL — ABNORMAL LOW (ref 0.7–4.0)
MCH: 30.5 pg (ref 26.0–34.0)
MCHC: 32.6 g/dL (ref 30.0–36.0)
MCV: 93.6 fL (ref 80.0–100.0)
Monocytes Absolute: 0.6 10*3/uL (ref 0.1–1.0)
Monocytes Relative: 7 %
Neutro Abs: 8.3 10*3/uL — ABNORMAL HIGH (ref 1.7–7.7)
Neutrophils Relative %: 88 %
Platelets: 252 10*3/uL (ref 150–400)
RBC: 3.44 MIL/uL — ABNORMAL LOW (ref 3.87–5.11)
RDW: 17.1 % — ABNORMAL HIGH (ref 11.5–15.5)
WBC: 9.5 10*3/uL (ref 4.0–10.5)
nRBC: 0 % (ref 0.0–0.2)

## 2020-01-23 MED ORDER — PANTOPRAZOLE SODIUM 40 MG PO TBEC
40.0000 mg | DELAYED_RELEASE_TABLET | Freq: Two times a day (BID) | ORAL | Status: DC
  Administered 2020-01-23 – 2020-01-27 (×8): 40 mg via ORAL
  Filled 2020-01-23 (×8): qty 1

## 2020-01-23 MED ORDER — CEFAZOLIN SODIUM-DEXTROSE 2-4 GM/100ML-% IV SOLN
2.0000 g | INTRAVENOUS | Status: AC
  Administered 2020-01-24: 2 g via INTRAVENOUS
  Filled 2020-01-23: qty 100

## 2020-01-23 MED ORDER — MORPHINE SULFATE (PF) 2 MG/ML IV SOLN
0.5000 mg | INTRAVENOUS | Status: DC | PRN
  Administered 2020-01-24: 0.5 mg via INTRAVENOUS
  Filled 2020-01-23: qty 1

## 2020-01-23 MED ORDER — BENZTROPINE MESYLATE 0.5 MG PO TABS
1.0000 mg | ORAL_TABLET | Freq: Every day | ORAL | Status: DC
  Administered 2020-01-24 – 2020-01-27 (×4): 1 mg via ORAL
  Filled 2020-01-23 (×4): qty 2

## 2020-01-23 MED ORDER — TRANEXAMIC ACID-NACL 1000-0.7 MG/100ML-% IV SOLN
1000.0000 mg | INTRAVENOUS | Status: AC
  Administered 2020-01-24: 1000 mg via INTRAVENOUS

## 2020-01-23 MED ORDER — ENSURE PRE-SURGERY PO LIQD
296.0000 mL | ORAL | Status: AC
  Administered 2020-01-24: 296 mL via ORAL
  Filled 2020-01-23: qty 296

## 2020-01-23 MED ORDER — SERTRALINE HCL 100 MG PO TABS
200.0000 mg | ORAL_TABLET | Freq: Every day | ORAL | Status: DC
  Administered 2020-01-24 – 2020-01-27 (×4): 200 mg via ORAL
  Filled 2020-01-23 (×4): qty 2

## 2020-01-23 MED ORDER — SERTRALINE HCL 50 MG PO TABS
50.0000 mg | ORAL_TABLET | Freq: Every day | ORAL | Status: DC
  Administered 2020-01-23 – 2020-01-26 (×4): 50 mg via ORAL
  Filled 2020-01-23 (×4): qty 1

## 2020-01-23 MED ORDER — SUCRALFATE 1 GM/10ML PO SUSP
1.0000 g | Freq: Four times a day (QID) | ORAL | Status: DC
  Administered 2020-01-23 – 2020-01-27 (×13): 1 g via ORAL
  Filled 2020-01-23 (×12): qty 10

## 2020-01-23 MED ORDER — POVIDONE-IODINE 10 % EX SWAB: 2.0000 "application " | Freq: Once | CUTANEOUS | Status: DC

## 2020-01-23 MED ORDER — TRANEXAMIC ACID 1000 MG/10ML IV SOLN
2000.0000 mg | INTRAVENOUS | Status: AC
  Filled 2020-01-23: qty 20

## 2020-01-23 MED ORDER — TRAZODONE HCL 50 MG PO TABS
50.0000 mg | ORAL_TABLET | Freq: Every evening | ORAL | Status: DC | PRN
  Administered 2020-01-25: 50 mg via ORAL
  Filled 2020-01-23: qty 1

## 2020-01-23 MED ORDER — SERTRALINE HCL 50 MG PO TABS: 100.0000 mg | ORAL_TABLET | ORAL | Status: DC

## 2020-01-23 MED ORDER — PANCRELIPASE (LIP-PROT-AMYL) 12000-38000 UNITS PO CPEP
36000.0000 [IU] | ORAL_CAPSULE | Freq: Three times a day (TID) | ORAL | Status: DC
  Administered 2020-01-24 – 2020-01-27 (×9): 36000 [IU] via ORAL
  Filled 2020-01-23 (×9): qty 3

## 2020-01-23 MED ORDER — MORPHINE SULFATE (PF) 2 MG/ML IV SOLN
2.0000 mg | Freq: Once | INTRAVENOUS | Status: AC
  Administered 2020-01-23: 2 mg via INTRAVENOUS
  Filled 2020-01-23: qty 1

## 2020-01-23 MED ORDER — ZIPRASIDONE HCL 80 MG PO CAPS
80.0000 mg | ORAL_CAPSULE | Freq: Two times a day (BID) | ORAL | Status: DC
  Administered 2020-01-23 – 2020-01-27 (×6): 80 mg via ORAL
  Filled 2020-01-23 (×3): qty 1
  Filled 2020-01-23: qty 4
  Filled 2020-01-23 (×5): qty 1

## 2020-01-23 MED ORDER — MORPHINE SULFATE (PF) 4 MG/ML IV SOLN
4.0000 mg | Freq: Once | INTRAVENOUS | Status: AC
  Administered 2020-01-23: 4 mg via INTRAVENOUS
  Filled 2020-01-23: qty 1

## 2020-01-23 NOTE — ED Provider Notes (Signed)
[facility] DEPT Provider Note   CSN: [id number] Arrival date & time: 01/23/20  1747     History Chief Complaint  Patient presents with  . Fall    SCOTTIE STANISH is a 64 y.o. female.  64 year old female with prior medical history as detailed below presents for evaluation.  Patient reports that she fell while at home.  She reports that she tripped.  She landed hard on her left hip.  She was unable to stand after the fall.  She denies head injury or neck pain.  She denies LOC.  She complains of significant pain to the left hip.  The history is provided by the patient and medical records.  Fall This is a new problem. The current episode started 3 to 5 hours ago. The problem occurs rarely. The problem has not changed since onset.Nothing aggravates the symptoms. Nothing relieves the symptoms.       Past Medical History:  Diagnosis Date  . Depression   . Diverticulosis   . GERD (gastroesophageal reflux disease)   . IBS (irritable bowel syndrome)   . Internal hemorrhoid    2nd degree  . Myocarditis ([place]) 2009  . Osteoarthritis   . Prolapsed internal hemorrhoids, grade 3 12/01/2015  . Schizoaffective disorder ([place])   . Tubular adenoma of colon 08/2011    Patient Active Problem List   Diagnosis Date Noted  . HTN (hypertension) 01/12/2020  . Hypophosphatemia 01/12/2020  . Hypokalemia 01/12/2020  . Acute pancreatitis 01/08/2020  . Depression 01/08/2020  . Diverticulosis 01/08/2020  . Tubular adenoma of colon 01/08/2020  . Sepsis, unspecified organism ([place]) 01/08/2020  . Prolapsed internal hemorrhoids, grade 3 12/01/2015  . Schizoaffective disorder, depressive type ([place]) 11/04/2013  . PTSD (post-traumatic stress disorder) 11/04/2013  . Cigarette nicotine dependence without complication 56/38/7564    Past Surgical History:  Procedure Laterality Date  . BREAST BIOPSY Right 12/27/2012  . CHOLECYSTECTOMY    . HEMORRHOID BANDING       OB  History   No obstetric history on file.     Family History  Problem Relation Age of Onset  . Suicidality Father   . Depression Father   . Suicidality Sister   . Depression Sister   . Suicidality Cousin   . Suicidality Other     Social History   Tobacco Use  . Smoking status: Former Smoker    Packs/day: 1.00    Years: 38.00    Pack years: 38.00    Types: Cigarettes    Quit date: 05/06/2018    Years since quitting: 1.7  . Smokeless tobacco: Never Used  Vaping Use  . Vaping Use: Never used  Substance Use Topics  . Alcohol use: No    Alcohol/week: 0.0 standard drinks  . Drug use: No    Home Medications Prior to Admission medications   Medication Sig Start Date End Date Taking? Authorizing Provider  benztropine (COGENTIN) 1 MG tablet Take 1 tablet (1 mg total) by mouth daily. 11/06/19   [name], MD  clobetasol (TEMOVATE) 0.05 % external solution Apply 1 application topically 2 (two) times daily.  12/13/19   [provider]  cyclobenzaprine (FLEXERIL) 5 MG tablet Take 5 mg by mouth 3 (three) times daily as needed for muscle spasms.  10/27/19   [provider]  Fluocinolone Acetonide Scalp 0.01 % OIL Apply 1 application topically at bedtime.  12/09/19   [provider]  fluocinonide cream (LIDEX) 0.05 % Apply topically 3 (three) times  daily. 11/03/19   [provider]  lipase/protease/amylase (CREON) 36000 UNITS CPEP capsule Take 1 capsule (36,000 Units total) by mouth 3 (three) times daily before meals. 01/20/20 02/19/20  Donne Hazel, MD  lisinopril (ZESTRIL) 20 MG tablet Take 1 tablet (20 mg total) by mouth daily. 01/21/20 02/20/20  Donne Hazel, MD  ondansetron (ZOFRAN) 4 MG tablet Take 4 mg by mouth every 8 (eight) hours as needed for nausea or vomiting.  09/25/19   [provider]  pantoprazole (PROTONIX) 40 MG tablet Take 1 tablet (40 mg total) by mouth daily. 01/21/20 02/20/20  Donne Hazel, MD  sertraline (ZOLOFT)  100 MG tablet Take 1 tablet (100 mg total) by mouth daily. 01/15/20   Donne Hazel, MD  sucralfate (CARAFATE) 1 GM/10ML suspension Take 10 mLs (1 g total) by mouth 4 (four) times daily -  with meals and at bedtime. 01/20/20 02/19/20  Donne Hazel, MD  traMADol (ULTRAM) 50 MG tablet Take 1 tablet (50 mg total) by mouth every 6 (six) hours as needed for severe pain. 01/20/20   Donne Hazel, MD  traZODone (DESYREL) 50 MG tablet Take 0.5 tablets (25 mg total) by mouth at bedtime as needed for sleep. 01/14/20   Donne Hazel, MD  Vitamin D, Ergocalciferol, (DRISDOL) 1.25 MG (50000 UNIT) CAPS capsule Take 50,000 Units by mouth once a week. 11/21/19   [provider]  ziprasidone (GEODON) 80 MG capsule Take 1 capsule (80 mg total) by mouth at bedtime. 01/14/20   Donne Hazel, MD    Allergies    Penicillins and Sulfa antibiotics  Review of Systems   Review of Systems  All other systems reviewed and are negative.   Physical Exam Updated Vital Signs BP (!) 169/76 (BP Location: Left Arm)   Pulse 81   Temp 97.9 F (36.6 C) (Oral)   Resp 20   SpO2 99%   Physical Exam Vitals and nursing note reviewed.  Constitutional:      General: She is not in acute distress.    Appearance: She is well-developed.  HENT:     Head: Normocephalic and atraumatic.  Eyes:     Conjunctiva/sclera: Conjunctivae normal.     Pupils: Pupils are equal, round, and reactive to light.  Cardiovascular:     Rate and Rhythm: Normal rate and regular rhythm.     Heart sounds: Normal heart sounds.  Pulmonary:     Effort: Pulmonary effort is normal. No respiratory distress.     Breath sounds: Normal breath sounds.  Abdominal:     General: There is no distension.     Palpations: Abdomen is soft.     Tenderness: There is no abdominal tenderness.  Musculoskeletal:        General: Tenderness present. No deformity. Normal range of motion.     Cervical back: Normal range of motion and neck supple.      Comments: Significant tenderness with palpation or attempted ranging of the left hip.  Distal BLE are NVI.    Skin:    General: Skin is warm and dry.  Neurological:     Mental Status: She is alert and oriented to person, place, and time.     ED Results / Procedures / Treatments   Labs (all labs ordered are listed, but only abnormal results are displayed) Labs Reviewed  CBC WITH DIFFERENTIAL/PLATELET - Abnormal; Notable for the following components:      Result Value   RBC 3.44 (*)  Hemoglobin 10.5 (*)    HCT 32.2 (*)    RDW 17.1 (*)    Neutro Abs 8.3 (*)    Lymphs Abs 0.4 (*)    All other components within normal limits  PROTIME-INR - Abnormal; Notable for the following components:   Prothrombin Time 16.4 (*)    INR 1.4 (*)    All other components within normal limits  COMPREHENSIVE METABOLIC PANEL - Abnormal; Notable for the following components:   Potassium 3.3 (*)    CO2 20 (*)    Glucose, Bld 112 (*)    BUN 7 (*)    Albumin 3.4 (*)    Total Bilirubin 1.5 (*)    All other components within normal limits  RESPIRATORY PANEL BY RT PCR (FLU A&B, COVID)  URINALYSIS, ROUTINE W REFLEX MICROSCOPIC    EKG None  Radiology DG Chest 1 View  Result Date: 01/23/2020 CLINICAL DATA:  Recent trip and fall with known left femoral fracture EXAM: CHEST  1 VIEW COMPARISON:  01/08/2020 FINDINGS: The heart size and mediastinal contours are within normal limits. Both lungs are clear. The visualized skeletal structures are unremarkable. IMPRESSION: No active disease. Electronically Signed   By: Inez Catalina M.D.   On: 01/23/2020 18:56   DG Hip Unilat W or Wo Pelvis 2-3 Views Left  Result Date: 01/23/2020 CLINICAL DATA:  Tripped over dog today with hip pain, initial encounter EXAM: DG HIP (WITH OR WITHOUT PELVIS) 3V LEFT COMPARISON:  None. FINDINGS: Comminuted left intratrochanteric fracture is noted with impaction and angulation at the fracture site. Pelvic ring is otherwise intact.  No soft tissue abnormality is seen. IMPRESSION: Comminuted proximal left femoral fracture involving the intratrochanteric region. Electronically Signed   By: Inez Catalina M.D.   On: 01/23/2020 18:55    Procedures Procedures (including critical care time)  Medications Ordered in ED Medications  morphine 2 MG/ML injection 2 mg (has no administration in time range)    ED Course  I have reviewed the triage vital signs and the nursing notes.  Pertinent labs & imaging results that were available during my care of the patient were reviewed by me and considered in my medical decision making (see chart for details).    MDM Rules/Calculators/A&P                          MDM  Screen complete  COLLEN HOSTLER was evaluated in Emergency Department on 01/23/2020 for the symptoms described in the history of present illness. She was evaluated in the context of the global COVID-19 pandemic, which necessitated consideration that the patient might be at risk for infection with the SARS-CoV-2 virus that causes COVID-19. Institutional protocols and algorithms that pertain to the evaluation of patients at risk for COVID-19 are in a state of rapid change based on information released by regulatory bodies including the CDC and federal and state organizations. These policies and algorithms were followed during the patient's care in the ED.  Patient is presenting for evaluation following fall.  Patient with evidence of left hip fracture on exam and on imaging.  Dr. Erlinda Hong of [facility] is aware of case.  He requests that the patient be admitted to medicine and transferred to [facility] for orthopedic evaluation.  Hospitalist service is aware of case and will evaluate for admission.   Final Clinical Impression(s) / ED Diagnoses Final diagnoses:  Closed fracture of left hip, initial encounter ([place])    Rx / DC  Orders ED Discharge Orders    None       Valarie Merino, MD 01/23/20 2047

## 2020-01-23 NOTE — ED Triage Notes (Signed)
Per EMS-states she tripped over dog and fell injuring left hip-shortening and rotation-100 mcg of Fentanyl given in route

## 2020-01-23 NOTE — ED Notes (Signed)
Pt placed on purewick at 80 mmHg.  

## 2020-01-23 NOTE — Progress Notes (Signed)
I have been notified of the patient's left hip fracture.  Plan is for surgical repair sat morning pending medical clearance.  Please hold all anticoagulation.  NPO after midnight orders have been placed.  She may have a diet tonight.    Joyce Cecil, MD [facility] [phone] 7:11 PM

## 2020-01-23 NOTE — H&P (Signed)
History and Physical    [name] HCW:[id number] DOB: [birth year] [name]  PCP: Bartholome Bill, MD  Patient coming from: Home.  Chief Complaint: Fall.  HPI: [name] is a 64 y.o. female with history of hypertension and schizoaffective disorder discharged about 3 days ago after being admitted for acute pancreatitis cause not clear had a fall after she tripped on her dog.  Did not lose consciousness denies any chest pain.  Has been only on liquid diet since discharge after being admitted for acute pancreatitis recently.  ED Course: In the ER x-rays revealed left hip fracture.  On-call orthopedic surgeon Dr. Erlinda Hong has been consulted.  Plan is to do surgery.  Labs are significant for hemoglobin of 10.5 which appears to be at baseline potassium of 3.3 Covid test was negative EKG is pending.  Chest x-ray unremarkable.  Review of Systems: As per HPI, rest all negative.   Past Medical History:  Diagnosis Date  . Depression   . Diverticulosis   . GERD (gastroesophageal reflux disease)   . IBS (irritable bowel syndrome)   . Internal hemorrhoid    2nd degree  . Myocarditis ([place]) 2009  . Osteoarthritis   . Prolapsed internal hemorrhoids, grade 3 12/01/2015  . Schizoaffective disorder ([place])   . Tubular adenoma of colon 08/2011    Past Surgical History:  Procedure Laterality Date  . BREAST BIOPSY Right 12/27/2012  . CHOLECYSTECTOMY    . HEMORRHOID BANDING       reports that she quit smoking about 20 months ago. Her smoking use included cigarettes. She has a 38.00 pack-year smoking history. She has never used smokeless tobacco. She reports that she does not drink alcohol and does not use drugs.  Allergies  Allergen Reactions  . Penicillins     Did it involve swelling of the face/tongue/throat, SOB, or low BP? N Did it involve sudden or severe rash/hives, skin peeling, or any reaction on the inside of your mouth or nose? Y Did you need to seek medical attention  at a hospital or doctor's office? N When did it last happen?Several Years Ago If all above answers are "NO", may proceed with cephalosporin use.     . Sulfa Antibiotics Rash    Family History  Problem Relation Age of Onset  . Suicidality Father   . Depression Father   . Suicidality Sister   . Depression Sister   . Suicidality Cousin   . Suicidality Other     Prior to Admission medications   Medication Sig Start Date End Date Taking? Authorizing Provider  acetaminophen (TYLENOL) 500 MG tablet Take 2,000 mg by mouth every 6 (six) hours as needed for moderate pain.   Yes [provider]  benztropine (COGENTIN) 1 MG tablet Take 1 tablet (1 mg total) by mouth daily. 11/06/19  Yes [name], MD  clobetasol (TEMOVATE) 0.05 % external solution Apply 1 application topically 2 (two) times daily.  12/13/19  Yes [provider]  cyclobenzaprine (FLEXERIL) 5 MG tablet Take 5 mg by mouth 3 (three) times daily as needed for muscle spasms.  10/27/19  Yes [provider]  fluocinonide cream (LIDEX) 1.76 % Apply 1 application topically 3 (three) times daily.  11/03/19  Yes [provider]  lipase/protease/amylase (CREON) 36000 UNITS CPEP capsule Take 1 capsule (36,000 Units total) by mouth 3 (three) times daily before meals. 01/20/20 02/19/20 Yes Donne Hazel, MD  lisinopril (ZESTRIL) 20 MG tablet Take 1 tablet (20 mg  total) by mouth daily. 01/21/20 02/20/20 Yes Donne Hazel, MD  meloxicam (MOBIC) 15 MG tablet Take 15 mg by mouth daily. 01/20/20  Yes [provider]  omeprazole (PRILOSEC) 40 MG capsule Take 40 mg by mouth in the morning and at bedtime.    Yes [provider]  ondansetron (ZOFRAN) 4 MG tablet Take 4 mg by mouth every 8 (eight) hours as needed for nausea or vomiting.  09/25/19  Yes [provider]  sertraline (ZOLOFT) 100 MG tablet Take 1 tablet (100 mg total) by mouth daily. Patient taking differently: Take 100  mg by mouth See admin instructions. Takes 200 mg in the morning and 150 mg at night 01/15/20  Yes Donne Hazel, MD  sucralfate (CARAFATE) 1 GM/10ML suspension Take 10 mLs (1 g total) by mouth 4 (four) times daily -  with meals and at bedtime. Patient taking differently: Take 1 g by mouth 4 (four) times daily.  01/20/20 02/19/20 Yes Donne Hazel, MD  traZODone (DESYREL) 50 MG tablet Take 0.5 tablets (25 mg total) by mouth at bedtime as needed for sleep. Patient taking differently: Take 50 mg by mouth at bedtime as needed for sleep.  01/14/20  Yes Donne Hazel, MD  Vitamin D, Ergocalciferol, (DRISDOL) 1.25 MG (50000 UNIT) CAPS capsule Take 50,000 Units by mouth once a week. 11/21/19  Yes [provider]  ziprasidone (GEODON) 80 MG capsule Take 1 capsule (80 mg total) by mouth at bedtime. Patient taking differently: Take 80 mg by mouth in the morning and at bedtime.  01/14/20  Yes Donne Hazel, MD  pantoprazole (PROTONIX) 40 MG tablet Take 1 tablet (40 mg total) by mouth daily. Patient not taking: Reported on Keith 01/21/20 02/20/20  Donne Hazel, MD  traMADol (ULTRAM) 50 MG tablet Take 1 tablet (50 mg total) by mouth every 6 (six) hours as needed for severe pain. Patient not taking: Reported on Keith 01/20/20   Donne Hazel, MD    Physical Exam: Constitutional: Moderately built and nourished. Vitals:   01/23/20 1816 01/23/20 1900 01/23/20 1915 01/23/20 2000  BP: (!) 169/76 (!) 155/78 (!) 160/75 (!) 144/75  Pulse: 81 77 76 77  Resp: 20 (!) 21 20 (!) 21  Temp: 97.9 F (36.6 C)     TempSrc: Oral     SpO2: 99% 97% 97% 97%   Eyes: Anicteric no pallor. ENMT: No discharge from the ears eyes nose or mouth. Neck: No mass felt.  No neck rigidity. Respiratory: No rhonchi or crepitations. Cardiovascular: S1-S2 heard. Abdomen: Soft nontender bowel sounds present. Musculoskeletal: No edema.  Left leg is externally rotated. Skin: No rash. Neurologic: Alert awake  oriented to time place and person.  Moves all extremities. Psychiatric: Appears normal.  Normal affect.   Labs on Admission: I have personally reviewed following labs and imaging studies  CBC: Recent Labs  Lab 01/23/20 1824  WBC 9.5  NEUTROABS 8.3*  HGB 10.5*  HCT 32.2*  MCV 93.6  PLT 233   Basic Metabolic Panel: Recent Labs  Lab 01/17/20 0709 01/18/20 0651 01/19/20 0605 01/20/20 0603 01/23/20 1824  NA 140 139 140 144 138  K 3.3* 3.3* 3.7 3.5 3.3*  CL 108 105 107 110 103  CO2 25 24 23 22  20*  GLUCOSE 121* 133* 135* 114* 112*  BUN <5* <5* <5* <5* 7*  CREATININE 0.36* 0.42* 0.40* 0.48 0.51  CALCIUM 8.7* 8.9 9.0 9.2 9.1  MG  --   --  1.8  --   --  GFR: Estimated Creatinine Clearance: 67.2 mL/min (by C-G formula based on SCr of 0.51 mg/dL). Liver Function Tests: Recent Labs  Lab 01/17/20 0709 01/18/20 0651 01/19/20 0605 01/20/20 0603 01/23/20 1824  AST 15 20 23 24 30   ALT 15 17 16 19 22   ALKPHOS 61 68 64 60 77  BILITOT 0.6 0.8 0.8 0.8 1.5*  PROT 5.6* 6.1* 6.4* 5.7* 6.6  ALBUMIN 2.7* 3.0* 3.1* 2.7* 3.4*   Recent Labs  Lab 01/17/20 0709  LIPASE 25   No results for input(s): AMMONIA in the last 168 hours. Coagulation Profile: Recent Labs  Lab 01/23/20 1824  INR 1.4*   Cardiac Enzymes: No results for input(s): CKTOTAL, CKMB, CKMBINDEX, TROPONINI in the last 168 hours. BNP (last 3 results) No results for input(s): PROBNP in the last 8760 hours. HbA1C: No results for input(s): HGBA1C in the last 72 hours. CBG: Recent Labs  Lab 01/19/20 2044 01/20/20 0014 01/20/20 0527 01/20/20 0737 01/20/20 1207  GLUCAP 107* 139* 156* 122* 167*   Lipid Profile: No results for input(s): CHOL, HDL, LDLCALC, TRIG, CHOLHDL, LDLDIRECT in the last 72 hours. Thyroid Function Tests: No results for input(s): TSH, T4TOTAL, FREET4, T3FREE, THYROIDAB in the last 72 hours. Anemia Panel: No results for input(s): VITAMINB12, FOLATE, FERRITIN, TIBC, IRON, RETICCTPCT in the  last 72 hours. Urine analysis:    Component Value Date/Time   COLORURINE YELLOW 01/08/2020 1802   APPEARANCEUR CLEAR 01/08/2020 1802   LABSPEC 1.016 01/08/2020 1802   PHURINE 5.0 01/08/2020 1802   GLUCOSEU NEGATIVE 01/08/2020 1802   HGBUR SMALL (A) 01/08/2020 1802   BILIRUBINUR NEGATIVE 01/08/2020 1802   KETONESUR 80 (A) 01/08/2020 1802   PROTEINUR 100 (A) 01/08/2020 1802   UROBILINOGEN 1.0 09/14/2008 2239   NITRITE NEGATIVE 01/08/2020 1802   LEUKOCYTESUR NEGATIVE 01/08/2020 1802   Sepsis Labs: @LABRCNTIP (procalcitonin:4,lacticidven:4) ) Recent Results (from the past 240 hour(s))  Respiratory Panel by RT PCR (Flu A&B, Covid) - Nasopharyngeal Swab     Status: None   Collection Time: 01/23/20  6:24 PM   Specimen: Nasopharyngeal Swab  Result Value Ref Range Status   SARS Coronavirus 2 by RT PCR NEGATIVE NEGATIVE Final    Comment: (NOTE) SARS-CoV-2 target nucleic acids are NOT DETECTED.  The SARS-CoV-2 RNA is generally detectable in upper respiratoy specimens during the acute phase of infection. The lowest concentration of SARS-CoV-2 viral copies this assay can detect is 131 copies/mL. A negative result does not preclude SARS-Cov-2 infection and should not be used as the sole basis for treatment or other patient management decisions. A negative result may occur with  improper specimen collection/handling, submission of specimen other than nasopharyngeal swab, presence of viral mutation(s) within the areas targeted by this assay, and inadequate number of viral copies (<131 copies/mL). A negative result must be combined with clinical observations, patient history, and epidemiological information. The expected result is Negative.  Fact Sheet for Patients:  PinkCheek.be  Fact Sheet for Healthcare Providers:  GravelBags.it  This test is no t yet approved or cleared by the [place] FDA and  has been authorized for  detection and/or diagnosis of SARS-CoV-2 by FDA under an Emergency Use Authorization (EUA). This EUA will remain  in effect (meaning this test can be used) for the duration of the COVID-19 declaration under Section 564(b)(1) of the Act, 21 U.S.C. section 360bbb-3(b)(1), unless the authorization is terminated or revoked sooner.     Influenza A by PCR NEGATIVE NEGATIVE Final   Influenza B by PCR NEGATIVE NEGATIVE Final  Comment: (NOTE) The Xpert Xpress SARS-CoV-2/FLU/RSV assay is intended as an aid in  the diagnosis of influenza from Nasopharyngeal swab specimens and  should not be used as a sole basis for treatment. Nasal washings and  aspirates are unacceptable for Xpert Xpress SARS-CoV-2/FLU/RSV  testing.  Fact Sheet for Patients: PinkCheek.be  Fact Sheet for Healthcare Providers: GravelBags.it  This test is not yet approved or cleared by the [place] FDA and  has been authorized for detection and/or diagnosis of SARS-CoV-2 by  FDA under an Emergency Use Authorization (EUA). This EUA will remain  in effect (meaning this test can be used) for the duration of the  Covid-19 declaration under Section 564(b)(1) of the Act, 21  U.S.C. section 360bbb-3(b)(1), unless the authorization is  terminated or revoked. Performed at [facility], [place] [address], [place], [place] [address]      Radiological Exams on Admission: DG Chest 1 View  Result Date: Keith CLINICAL DATA:  Recent trip and fall with known left femoral fracture EXAM: CHEST  1 VIEW COMPARISON:  01/08/2020 FINDINGS: The heart size and mediastinal contours are within normal limits. Both lungs are clear. The visualized skeletal structures are unremarkable. IMPRESSION: No active disease. Electronically Signed   By: Inez Catalina M.D.   On: Keith 18:56   DG Hip Unilat W or Wo Pelvis 2-3 Views Left  Result Date: Keith CLINICAL  DATA:  Tripped over dog today with hip pain, initial encounter EXAM: DG HIP (WITH OR WITHOUT PELVIS) 3V LEFT COMPARISON:  None. FINDINGS: Comminuted left intratrochanteric fracture is noted with impaction and angulation at the fracture site. Pelvic ring is otherwise intact. No soft tissue abnormality is seen. IMPRESSION: Comminuted proximal left femoral fracture involving the intratrochanteric region. Electronically Signed   By: Inez Catalina M.D.   On: Keith 18:55      Assessment/Plan Principal Problem:   Hip fracture (HCC) Active Problems:   Schizoaffective disorder, depressive type (HCC)   HTN (hypertension)   Closed left hip fracture, initial encounter ([place])    1. Left hip fracture status post mechanical fall for which orthopedic surgeon Dr. Erlinda Hong has been consulted.  Discussed with Dr. Erlinda Hong who is planning to do surgery in the morning for which patient will be kept n.p.o. past midnight.  Pain relief medications.  Physical therapy consult. 2. Hypertension since patient will be n.p.o. we will keep patient on as needed IV hydralazine. 3. History of schizo's affective disorder on Geodon Cogentin.  Will be continued. 4. Anemia appears to be chronic follow CBC. 5. Patient admitted for pancreatitis.  Closely monitor.  Since patient has hip fracture will need more than 2 midnight stay in inpatient status.  Preoperative EKG is pending.   DVT prophylaxis: SCDs for now.  Pharmacological DVT prophylaxis once okay with orthopedic surgery. Code Status: Full code. Family Communication: Discussed with patient. Disposition Plan: May need rehab. Consults called: Orthopedic surgery. Admission status: Inpatient.   Rise Patience MD [facility] Pager [phone].  If 7PM-7AM, please contact night-coverage [email or web address] Password [email or web address]  Keith, 9:20 PM

## 2020-01-23 NOTE — Anesthesia Preprocedure Evaluation (Deleted)
Anesthesia Evaluation    Airway        Dental   Pulmonary former smoker,           Cardiovascular hypertension, Pt. on medications   '15 ECHO: EF 55-60% with mild MR, trivial TR   Neuro/Psych Anxiety Depression Schizophrenia PTSD   GI/Hepatic GERD  Medicated,  Endo/Other    Renal/GU      Musculoskeletal   Abdominal   Peds  Hematology  (+) Blood dyscrasia (Hb 10.4), anemia ,   Anesthesia Other Findings   Reproductive/Obstetrics                             Anesthesia Physical Anesthesia Plan  ASA: III  Anesthesia Plan: General   Post-op Pain Management:    Induction: Intravenous  PONV Risk Score and Plan: 3 and Ondansetron and Dexamethasone  Airway Management Planned:   Additional Equipment:   Intra-op Plan:   Post-operative Plan:   Informed Consent:   Plan Discussed with:   Anesthesia Plan Comments:         Anesthesia Quick Evaluation

## 2020-01-24 ENCOUNTER — Other Ambulatory Visit: Payer: Self-pay

## 2020-01-24 ENCOUNTER — Inpatient Hospital Stay (HOSPITAL_COMMUNITY): Payer: Medicare Other | Admitting: Registered Nurse

## 2020-01-24 ENCOUNTER — Encounter (HOSPITAL_COMMUNITY)
Admission: EM | Disposition: A | Discharge status: Home / Self Care | Payer: Self-pay | Source: Home / Self Care | Attending: Family Medicine

## 2020-01-24 ENCOUNTER — Encounter (HOSPITAL_COMMUNITY): Payer: Self-pay | Admitting: Internal Medicine

## 2020-01-24 ENCOUNTER — Inpatient Hospital Stay (HOSPITAL_COMMUNITY): Payer: Medicare Other

## 2020-01-24 DIAGNOSIS — S72142A Displaced intertrochanteric fracture of left femur, initial encounter for closed fracture: Secondary | ICD-10-CM

## 2020-01-24 DIAGNOSIS — I10 Essential (primary) hypertension: Secondary | ICD-10-CM | POA: Diagnosis not present

## 2020-01-24 HISTORY — PX: INTRAMEDULLARY (IM) NAIL INTERTROCHANTERIC: SHX5875

## 2020-01-24 LAB — CBC
HCT: 28.2 % — ABNORMAL LOW (ref 36.0–46.0)
Hemoglobin: 9.5 g/dL — ABNORMAL LOW (ref 12.0–15.0)
MCH: 31.5 pg (ref 26.0–34.0)
MCHC: 33.7 g/dL (ref 30.0–36.0)
MCV: 93.4 fL (ref 80.0–100.0)
Platelets: 189 10*3/uL (ref 150–400)
RBC: 3.02 MIL/uL — ABNORMAL LOW (ref 3.87–5.11)
RDW: 17.2 % — ABNORMAL HIGH (ref 11.5–15.5)
WBC: 6.4 10*3/uL (ref 4.0–10.5)
nRBC: 0 % (ref 0.0–0.2)

## 2020-01-24 LAB — SURGICAL PCR SCREEN
MRSA, PCR: NEGATIVE
Staphylococcus aureus: POSITIVE — AB

## 2020-01-24 LAB — CREATININE, SERUM
Creatinine, Ser: 0.42 mg/dL — ABNORMAL LOW (ref 0.44–1.00)
GFR, Estimated: >60 mL/min (ref >60)

## 2020-01-24 LAB — TYPE AND SCREEN
ABO/RH(D): O POS
Antibody Screen: NEGATIVE

## 2020-01-24 LAB — ABO/RH: ABO/RH(D): O POS

## 2020-01-24 SURGERY — FIXATION, FRACTURE, INTERTROCHANTERIC, WITH INTRAMEDULLARY ROD
Anesthesia: General | Site: Hip | Laterality: Left

## 2020-01-24 SURGERY — FIXATION, FRACTURE, INTERTROCHANTERIC, WITH INTRAMEDULLARY ROD
Anesthesia: Choice | Laterality: Left

## 2020-01-24 MED ORDER — HYDROMORPHONE HCL 1 MG/ML IJ SOLN
0.5000 mg | INTRAMUSCULAR | Status: DC | PRN
  Administered 2020-01-25: 1 mg via INTRAVENOUS
  Filled 2020-01-24: qty 1

## 2020-01-24 MED ORDER — ENOXAPARIN SODIUM 40 MG/0.4ML ~~LOC~~ SOLN
40.0000 mg | SUBCUTANEOUS | Status: DC
  Administered 2020-01-25 – 2020-01-27 (×3): 40 mg via SUBCUTANEOUS
  Filled 2020-01-24 (×3): qty 0.4

## 2020-01-24 MED ORDER — PROPOFOL 10 MG/ML IV BOLUS
INTRAVENOUS | Status: AC
  Filled 2020-01-24: qty 20

## 2020-01-24 MED ORDER — FENTANYL CITRATE (PF) 100 MCG/2ML IJ SOLN
INTRAMUSCULAR | Status: AC
  Filled 2020-01-24: qty 2

## 2020-01-24 MED ORDER — SORBITOL 70 % SOLN
30.0000 mL | Freq: Every day | Status: DC | PRN
  Filled 2020-01-24: qty 30

## 2020-01-24 MED ORDER — OXYCODONE HCL 5 MG PO TABS
10.0000 mg | ORAL_TABLET | ORAL | Status: DC | PRN
  Administered 2020-01-24: 10 mg via ORAL
  Administered 2020-01-25: 15 mg via ORAL
  Filled 2020-01-24: qty 3

## 2020-01-24 MED ORDER — DEXAMETHASONE SODIUM PHOSPHATE 10 MG/ML IJ SOLN
INTRAMUSCULAR | Status: AC
  Filled 2020-01-24: qty 1

## 2020-01-24 MED ORDER — POLYETHYLENE GLYCOL 3350 17 G PO PACK
17.0000 g | PACK | Freq: Every day | ORAL | Status: DC | PRN
  Administered 2020-01-26 – 2020-01-27 (×2): 17 g via ORAL
  Filled 2020-01-24 (×2): qty 1

## 2020-01-24 MED ORDER — PHENYLEPHRINE HCL (PRESSORS) 10 MG/ML IV SOLN
INTRAVENOUS | Status: DC | PRN
  Administered 2020-01-24 (×2): 120 ug via INTRAVENOUS

## 2020-01-24 MED ORDER — PHENYLEPHRINE 40 MCG/ML (10ML) SYRINGE FOR IV PUSH (FOR BLOOD PRESSURE SUPPORT)
PREFILLED_SYRINGE | INTRAVENOUS | Status: AC
  Filled 2020-01-24: qty 10

## 2020-01-24 MED ORDER — FENTANYL CITRATE (PF) 250 MCG/5ML IJ SOLN
INTRAMUSCULAR | Status: AC
  Filled 2020-01-24: qty 5

## 2020-01-24 MED ORDER — MIDAZOLAM HCL 2 MG/2ML IJ SOLN
INTRAMUSCULAR | Status: AC
  Filled 2020-01-24: qty 2

## 2020-01-24 MED ORDER — HYDROMORPHONE HCL 1 MG/ML IJ SOLN
INTRAMUSCULAR | Status: AC
  Filled 2020-01-24: qty 1

## 2020-01-24 MED ORDER — PHENYLEPHRINE HCL-NACL 10-0.9 MG/250ML-% IV SOLN
INTRAVENOUS | Status: DC | PRN
  Administered 2020-01-24: 35 ug/min via INTRAVENOUS
  Administered 2020-01-24: 75 ug/min via INTRAVENOUS

## 2020-01-24 MED ORDER — PROPOFOL 10 MG/ML IV BOLUS
INTRAVENOUS | Status: DC | PRN
  Administered 2020-01-24: 100 mg via INTRAVENOUS
  Administered 2020-01-24: 30 mg via INTRAVENOUS

## 2020-01-24 MED ORDER — ONDANSETRON HCL 4 MG PO TABS: 4.0000 mg | ORAL_TABLET | Freq: Four times a day (QID) | ORAL | Status: DC | PRN

## 2020-01-24 MED ORDER — ACETAMINOPHEN 10 MG/ML IV SOLN
INTRAVENOUS | Status: DC | PRN
  Administered 2020-01-24: 1000 mg via INTRAVENOUS

## 2020-01-24 MED ORDER — ACETAMINOPHEN 325 MG PO TABS: 325.0000 mg | ORAL_TABLET | Freq: Four times a day (QID) | ORAL | Status: DC | PRN

## 2020-01-24 MED ORDER — ROCURONIUM BROMIDE 10 MG/ML (PF) SYRINGE
PREFILLED_SYRINGE | INTRAVENOUS | Status: AC
  Filled 2020-01-24: qty 10

## 2020-01-24 MED ORDER — ENOXAPARIN SODIUM 40 MG/0.4ML ~~LOC~~ SOLN: 40.0000 mg | Freq: Every day | SUBCUTANEOUS | 0 refills | Status: DC

## 2020-01-24 MED ORDER — CEFAZOLIN SODIUM-DEXTROSE 2-4 GM/100ML-% IV SOLN
INTRAVENOUS | Status: AC
  Filled 2020-01-24: qty 100

## 2020-01-24 MED ORDER — PHENYLEPHRINE HCL (PRESSORS) 10 MG/ML IV SOLN
INTRAVENOUS | Status: AC
  Filled 2020-01-24: qty 1

## 2020-01-24 MED ORDER — OXYCODONE-ACETAMINOPHEN 5-325 MG PO TABS: 1.0000 | ORAL_TABLET | Freq: Three times a day (TID) | ORAL | 0 refills | Status: DC | PRN

## 2020-01-24 MED ORDER — 0.9 % SODIUM CHLORIDE (POUR BTL) OPTIME
TOPICAL | Status: DC | PRN
  Administered 2020-01-24: 1000 mL

## 2020-01-24 MED ORDER — MUPIROCIN 2 % EX OINT
TOPICAL_OINTMENT | Freq: Two times a day (BID) | CUTANEOUS | Status: DC
  Administered 2020-01-24 – 2020-01-25 (×2): 1 via NASAL
  Filled 2020-01-24 (×2): qty 22

## 2020-01-24 MED ORDER — CEFAZOLIN SODIUM-DEXTROSE 2-4 GM/100ML-% IV SOLN
2.0000 g | Freq: Four times a day (QID) | INTRAVENOUS | Status: AC
  Administered 2020-01-24 – 2020-01-25 (×3): 2 g via INTRAVENOUS
  Filled 2020-01-24 (×3): qty 100

## 2020-01-24 MED ORDER — SUGAMMADEX SODIUM 200 MG/2ML IV SOLN
INTRAVENOUS | Status: DC | PRN
  Administered 2020-01-24: 200 mg via INTRAVENOUS

## 2020-01-24 MED ORDER — EPHEDRINE SULFATE 50 MG/ML IJ SOLN
INTRAMUSCULAR | Status: DC | PRN
  Administered 2020-01-24: 10 mg via INTRAVENOUS

## 2020-01-24 MED ORDER — MENTHOL 3 MG MT LOZG
1.0000 | LOZENGE | OROMUCOSAL | Status: DC | PRN
  Filled 2020-01-24: qty 9

## 2020-01-24 MED ORDER — LIDOCAINE 2% (20 MG/ML) 5 ML SYRINGE
INTRAMUSCULAR | Status: AC
  Filled 2020-01-24: qty 5

## 2020-01-24 MED ORDER — DOCUSATE SODIUM 100 MG PO CAPS
100.0000 mg | ORAL_CAPSULE | Freq: Two times a day (BID) | ORAL | Status: DC
  Administered 2020-01-24 – 2020-01-27 (×6): 100 mg via ORAL
  Filled 2020-01-24 (×6): qty 1

## 2020-01-24 MED ORDER — HYDROMORPHONE HCL 1 MG/ML IJ SOLN
0.2500 mg | INTRAMUSCULAR | Status: DC | PRN
  Administered 2020-01-24: 0.5 mg via INTRAVENOUS

## 2020-01-24 MED ORDER — LACTATED RINGERS IV SOLN: INTRAVENOUS | Status: DC | PRN

## 2020-01-24 MED ORDER — ROCURONIUM BROMIDE 10 MG/ML (PF) SYRINGE
PREFILLED_SYRINGE | INTRAVENOUS | Status: DC | PRN
  Administered 2020-01-24: 40 mg via INTRAVENOUS

## 2020-01-24 MED ORDER — SUCCINYLCHOLINE CHLORIDE 200 MG/10ML IV SOSY
PREFILLED_SYRINGE | INTRAVENOUS | Status: AC
  Filled 2020-01-24: qty 10

## 2020-01-24 MED ORDER — MIDAZOLAM HCL 5 MG/5ML IJ SOLN
INTRAMUSCULAR | Status: DC | PRN
  Administered 2020-01-24: 2 mg via INTRAVENOUS

## 2020-01-24 MED ORDER — ALBUMIN HUMAN 5 % IV SOLN: INTRAVENOUS | Status: DC | PRN

## 2020-01-24 MED ORDER — ACETAMINOPHEN 10 MG/ML IV SOLN
INTRAVENOUS | Status: AC
  Filled 2020-01-24: qty 100

## 2020-01-24 MED ORDER — METHOCARBAMOL 500 MG IVPB - SIMPLE MED
500.0000 mg | Freq: Four times a day (QID) | INTRAVENOUS | Status: DC | PRN
  Administered 2020-01-24: 500 mg via INTRAVENOUS
  Filled 2020-01-24: qty 500
  Filled 2020-01-24 (×2): qty 50

## 2020-01-24 MED ORDER — LIDOCAINE 2% (20 MG/ML) 5 ML SYRINGE
INTRAMUSCULAR | Status: DC | PRN
  Administered 2020-01-24: 50 mg via INTRAVENOUS

## 2020-01-24 MED ORDER — ONDANSETRON HCL 4 MG/2ML IJ SOLN: 4.0000 mg | Freq: Four times a day (QID) | INTRAMUSCULAR | Status: DC | PRN

## 2020-01-24 MED ORDER — FENTANYL CITRATE (PF) 100 MCG/2ML IJ SOLN
INTRAMUSCULAR | Status: DC | PRN
  Administered 2020-01-24: 100 ug via INTRAVENOUS
  Administered 2020-01-24 (×3): 50 ug via INTRAVENOUS

## 2020-01-24 MED ORDER — LABETALOL HCL 5 MG/ML IV SOLN
INTRAVENOUS | Status: DC | PRN
  Administered 2020-01-24: 2.5 mg via INTRAVENOUS

## 2020-01-24 MED ORDER — HYDRALAZINE HCL 20 MG/ML IJ SOLN: 10.0000 mg | INTRAMUSCULAR | Status: DC | PRN

## 2020-01-24 MED ORDER — OXYCODONE HCL 5 MG PO TABS
5.0000 mg | ORAL_TABLET | ORAL | Status: DC | PRN
  Administered 2020-01-24 – 2020-01-26 (×3): 10 mg via ORAL
  Administered 2020-01-26: 5 mg via ORAL
  Filled 2020-01-24: qty 1
  Filled 2020-01-24 (×4): qty 2

## 2020-01-24 MED ORDER — DEXAMETHASONE SODIUM PHOSPHATE 10 MG/ML IJ SOLN
INTRAMUSCULAR | Status: DC | PRN
  Administered 2020-01-24: 10 mg via INTRAVENOUS

## 2020-01-24 MED ORDER — PHENOL 1.4 % MT LIQD: 1.0000 | OROMUCOSAL | Status: DC | PRN

## 2020-01-24 MED ORDER — LIP MEDEX EX OINT
TOPICAL_OINTMENT | CUTANEOUS | Status: AC
  Filled 2020-01-24: qty 7

## 2020-01-24 MED ORDER — TRANEXAMIC ACID-NACL 1000-0.7 MG/100ML-% IV SOLN
INTRAVENOUS | Status: AC
  Filled 2020-01-24: qty 100

## 2020-01-24 MED ORDER — ISOPROPYL ALCOHOL 70 % SOLN
Status: AC
  Filled 2020-01-24: qty 480

## 2020-01-24 MED ORDER — FENTANYL CITRATE (PF) 100 MCG/2ML IJ SOLN
25.0000 ug | INTRAMUSCULAR | Status: DC | PRN
  Administered 2020-01-24 (×4): 50 ug via INTRAVENOUS

## 2020-01-24 MED ORDER — LISINOPRIL 20 MG PO TABS
20.0000 mg | ORAL_TABLET | Freq: Every day | ORAL | Status: DC
  Administered 2020-01-24 – 2020-01-25 (×2): 20 mg via ORAL
  Filled 2020-01-24 (×2): qty 1

## 2020-01-24 MED ORDER — OXYCODONE HCL ER 10 MG PO T12A
10.0000 mg | EXTENDED_RELEASE_TABLET | Freq: Two times a day (BID) | ORAL | Status: DC
  Administered 2020-01-24 – 2020-01-27 (×6): 10 mg via ORAL
  Filled 2020-01-24 (×6): qty 1

## 2020-01-24 MED ORDER — SUCCINYLCHOLINE CHLORIDE 200 MG/10ML IV SOSY
PREFILLED_SYRINGE | INTRAVENOUS | Status: DC | PRN
  Administered 2020-01-24: 140 mg via INTRAVENOUS

## 2020-01-24 MED ORDER — EPHEDRINE 5 MG/ML INJ
INTRAVENOUS | Status: AC
  Filled 2020-01-24: qty 10

## 2020-01-24 MED ORDER — METHOCARBAMOL 500 MG PO TABS
500.0000 mg | ORAL_TABLET | Freq: Four times a day (QID) | ORAL | Status: DC | PRN
  Administered 2020-01-25: 500 mg via ORAL
  Filled 2020-01-24: qty 1

## 2020-01-24 MED ORDER — SODIUM CHLORIDE 0.9 % IV SOLN: INTRAVENOUS | Status: DC

## 2020-01-24 MED ORDER — ACETAMINOPHEN 500 MG PO TABS
1000.0000 mg | ORAL_TABLET | Freq: Four times a day (QID) | ORAL | Status: AC
  Administered 2020-01-24 – 2020-01-25 (×3): 1000 mg via ORAL
  Filled 2020-01-24 (×3): qty 2

## 2020-01-24 MED ORDER — ALBUTEROL SULFATE HFA 108 (90 BASE) MCG/ACT IN AERS
INHALATION_SPRAY | RESPIRATORY_TRACT | Status: DC | PRN
  Administered 2020-01-24: 6 via RESPIRATORY_TRACT
  Administered 2020-01-24 (×2): 5 via RESPIRATORY_TRACT
  Administered 2020-01-24: 6 via RESPIRATORY_TRACT

## 2020-01-24 MED ORDER — ONDANSETRON HCL 4 MG/2ML IJ SOLN
INTRAMUSCULAR | Status: AC
  Filled 2020-01-24: qty 2

## 2020-01-24 MED ORDER — ALBUMIN HUMAN 5 % IV SOLN
INTRAVENOUS | Status: AC
  Filled 2020-01-24: qty 500

## 2020-01-24 MED ORDER — NICOTINE 14 MG/24HR TD PT24
14.0000 mg | MEDICATED_PATCH | Freq: Every day | TRANSDERMAL | Status: DC
  Administered 2020-01-24 – 2020-01-27 (×4): 14 mg via TRANSDERMAL
  Filled 2020-01-24 (×4): qty 1

## 2020-01-24 MED ORDER — MAGNESIUM CITRATE PO SOLN: 1.0000 | Freq: Once | ORAL | Status: DC | PRN

## 2020-01-24 MED ORDER — ALUM & MAG HYDROXIDE-SIMETH 200-200-20 MG/5ML PO SUSP
30.0000 mL | ORAL | Status: DC | PRN
  Administered 2020-01-25: 30 mL via ORAL
  Filled 2020-01-24: qty 30

## 2020-01-24 MED ORDER — ONDANSETRON HCL 4 MG/2ML IJ SOLN
INTRAMUSCULAR | Status: DC | PRN
  Administered 2020-01-24: 4 mg via INTRAVENOUS

## 2020-01-24 SURGICAL SUPPLY — 45 items
BIT DRILL INTERTAN LAG SCREW (BIT) ×2 IMPLANT
BIT DRILL SHORT 4.0 (BIT) IMPLANT
BLADE SURG 15 STRL LF DISP TIS (BLADE) ×1 IMPLANT
BLADE SURG 15 STRL SS (BLADE) ×3
BNDG GAUZE ELAST 4 BULKY (GAUZE/BANDAGES/DRESSINGS) ×3 IMPLANT
COVER WAND RF STERILE (DRAPES) IMPLANT
DRAPE SHEET LG 3/4 BI-LAMINATE (DRAPES) IMPLANT
DRAPE STERI IOBAN 125X83 (DRAPES) ×3 IMPLANT
DRILL BIT SHORT 4.0 (BIT) ×3
DRSG MEPILEX BORDER 4X4 (GAUZE/BANDAGES/DRESSINGS) ×6 IMPLANT
DRSG MEPILEX BORDER 4X8 (GAUZE/BANDAGES/DRESSINGS) ×3 IMPLANT
DRSG PAD ABDOMINAL 8X10 ST (GAUZE/BANDAGES/DRESSINGS) ×6 IMPLANT
DURAPREP 26ML APPLICATOR (WOUND CARE) ×3 IMPLANT
ELECT REM PT RETURN 15FT ADLT (MISCELLANEOUS) ×3 IMPLANT
FACESHIELD WRAPAROUND (MASK) ×3 IMPLANT
FACESHIELD WRAPAROUND OR TEAM (MASK) ×1 IMPLANT
GAUZE XEROFORM 5X9 LF (GAUZE/BANDAGES/DRESSINGS) ×3 IMPLANT
GLOVE BIOGEL PI IND STRL 8 (GLOVE) ×2 IMPLANT
GLOVE BIOGEL PI INDICATOR 8 (GLOVE) ×4
GLOVE ECLIPSE 8.0 STRL XLNG CF (GLOVE) ×3 IMPLANT
GLOVE ORTHO TXT STRL SZ7.5 (GLOVE) ×3 IMPLANT
GOWN STRL REUS W/ TWL XL LVL3 (GOWN DISPOSABLE) IMPLANT
GOWN STRL REUS W/TWL LRG LVL3 (GOWN DISPOSABLE) ×3 IMPLANT
GOWN STRL REUS W/TWL XL LVL3 (GOWN DISPOSABLE)
GUIDE PIN 3.2X343 (PIN) ×2
GUIDE PIN 3.2X343MM (PIN) ×6
KIT BASIN OR (CUSTOM PROCEDURE TRAY) ×3 IMPLANT
KIT TURNOVER KIT A (KITS) IMPLANT
MANIFOLD NEPTUNE II (INSTRUMENTS) ×3 IMPLANT
NAIL TRIGEN LEFT 10X38-125 (Nail) ×2 IMPLANT
NS IRRIG 1000ML POUR BTL (IV SOLUTION) ×3 IMPLANT
PACK GENERAL/GYN (CUSTOM PROCEDURE TRAY) ×3 IMPLANT
PAD CAST 4YDX4 CTTN HI CHSV (CAST SUPPLIES) ×2 IMPLANT
PADDING CAST COTTON 4X4 STRL (CAST SUPPLIES) ×6
PENCIL SMOKE EVACUATOR (MISCELLANEOUS) IMPLANT
PIN GUIDE 3.2X343MM (PIN) IMPLANT
PROTECTOR NERVE ULNAR (MISCELLANEOUS) ×6 IMPLANT
SCREW LAG COMPR KIT 85/80 (Screw) ×2 IMPLANT
SCREW TRIGEN LOW PROF 5.0X35 (Screw) ×2 IMPLANT
STAPLER VISISTAT 35W (STAPLE) ×3 IMPLANT
SUT VIC AB 0 CT1 27 (SUTURE) ×6
SUT VIC AB 0 CT1 27XBRD ANBCTR (SUTURE) ×2 IMPLANT
SUT VIC AB 2-0 CT1 27 (SUTURE) ×6
SUT VIC AB 2-0 CT1 TAPERPNT 27 (SUTURE) ×2 IMPLANT
TOWEL OR 17X26 10 PK STRL BLUE (TOWEL DISPOSABLE) ×6 IMPLANT

## 2020-01-24 NOTE — Progress Notes (Signed)
May give another 40mcg of fentanyl in pacu, then switch to Dilaudid - per Dr Nyoka Cowden

## 2020-01-24 NOTE — Op Note (Signed)
   Date of Surgery: 01/24/2020  INDICATIONS: Ms. Berwanger is a 64 y.o.-year-old female who sustained a left hip fracture. The risks and benefits of the procedure discussed with the patient prior to the procedure and all questions were answered; consent was obtained.  PREOPERATIVE DIAGNOSIS: left intertrochanteric fracture   POSTOPERATIVE DIAGNOSIS: Same   PROCEDURE: Open treatment of intertrochanteric fracture with intramedullary implant. CPT [phone]   SURGEON: N. Eduard Roux, M.D.   ASSIST: Ciro Backer [place], [place]; necessary for the timely completion of procedure and due to complexity of procedure.  ANESTHESIA: general   IV FLUIDS AND URINE: See anesthesia record   ESTIMATED BLOOD LOSS: 150 cc  IMPLANTS: [employer] InterTAN 10 x 38, 85/80 lag screws  DRAINS: None.   COMPLICATIONS: see description of procedure.   DESCRIPTION OF PROCEDURE: The patient was brought to the operating room and placed supine on the operating table. The patient's leg had been signed prior to the procedure. The patient had the anesthesia placed by the anesthesiologist. The prep verification and incision time-outs were performed to confirm that this was the correct patient, site, side and location. The patient had an SCD on the opposite lower extremity. The patient did receive antibiotics prior to the incision and was re-dosed during the procedure as needed at indicated intervals. The patient was positioned on the fracture table with the table in traction and internal rotation to reduce the hip. The well leg was placed in a scissor position and all bony prominences were well-padded. The patient had the lower extremity prepped and draped in the standard surgical fashion. The incision was made 4 finger breadths superior to the greater trochanter. A guide pin was inserted into the tip of the greater trochanter under fluoroscopic guidance. An opening reamer was used to gain access to the femoral canal. The nail  length was measured and inserted down the femoral canal to its proper depth. The appropriate version of insertion for the lag screw was found under fluoroscopy. A pin was inserted up the femoral neck through the jig. Then, a second antirotation pin was inserted inferior to the first pin. The length of the lag screw was then measured. The lag screw was inserted as near to center-center in the head as possible. The antirotation pin was then taken out and an interdigitating compression screw was placed in its place. The leg was taken out of traction, then the interdigitating compression screw was used to compress across the fracture. Compression was visualized on serial xrays.  A distal interlocking screw was placed using the perfect circle technique.  The wound was copiously irrigated with saline and the subcutaneous layer closed with 2.0 vicryl and the skin was reapproximated with staples. The wounds were cleaned and dried a final time and a sterile dressing was placed. The hip was taken through a range of motion at the end of the case under fluoroscopic imaging to visualize the approach-withdraw phenomenon and confirm implant length in the head. The patient was then awakened from anesthesia and taken to the recovery room in stable condition. All counts were correct at the end of the case.   POSTOPERATIVE PLAN: The patient will be weight bearing as tolerated and will return in 2 weeks for staple removal and the patient will receive DVT prophylaxis based on other medications, activity level, and risk ratio of bleeding to thrombosis.   Azucena Cecil, MD [facility] 8:57 AM

## 2020-01-24 NOTE — Anesthesia Postprocedure Evaluation (Signed)
Anesthesia Post Note  Patient: [name]  Procedure(s) Performed: INTRAMEDULLARY (IM) NAIL INTERTROCHANTRIC (Left Hip)     Patient location during evaluation: PACU Anesthesia Type: General Level of consciousness: awake Pain management: pain level controlled Vital Signs Assessment: post-procedure vital signs reviewed and stable Respiratory status: spontaneous breathing Cardiovascular status: stable Postop Assessment: no apparent nausea or vomiting Anesthetic complications: no   No complications documented.  Last Vitals:  Vitals:   01/24/20 1000 01/24/20 1015  BP: (!) 158/81 (!) 161/83  Pulse: 70 72  Resp: 17 12  Temp:    SpO2: 100% 98%    Last Pain:  Vitals:   01/24/20 1000  TempSrc:   PainSc: 10-Worst pain ever                 Geraldene Eisel

## 2020-01-24 NOTE — Anesthesia Preprocedure Evaluation (Addendum)
Anesthesia Evaluation  Patient identified by MRN, date of birth, ID band Patient awake    Reviewed: Allergy & Precautions, NPO status , Patient's Chart, lab work & pertinent test results  Airway Mallampati: II  TM Distance: >3 FB     Dental   Pulmonary former smoker,    breath sounds clear to auscultation       Cardiovascular hypertension,  Rhythm:Regular Rate:Normal     Neuro/Psych Anxiety Depression Schizophrenia    GI/Hepatic Neg liver ROS, GERD  ,  Endo/Other  negative endocrine ROS  Renal/GU negative Renal ROS     Musculoskeletal   Abdominal   Peds  Hematology   Anesthesia Other Findings   Reproductive/Obstetrics                             Anesthesia Physical Anesthesia Plan  ASA: III  Anesthesia Plan: General   Post-op Pain Management:    Induction: Intravenous  PONV Risk Score and Plan: 3 and Ondansetron, Dexamethasone and Midazolam  Airway Management Planned: Oral ETT  Additional Equipment:   Intra-op Plan:   Post-operative Plan: Extubation in OR  Informed Consent: I have reviewed the patients History and Physical, chart, labs and discussed the procedure including the risks, benefits and alternatives for the proposed anesthesia with the patient or authorized representative who has indicated his/her understanding and acceptance.     Dental advisory given  Plan Discussed with: CRNA and Anesthesiologist  Anesthesia Plan Comments:         Anesthesia Quick Evaluation

## 2020-01-24 NOTE — Plan of Care (Signed)
Care plan initiated.

## 2020-01-24 NOTE — Consult Note (Signed)
ORTHOPAEDIC CONSULTATION  REQUESTING PHYSICIAN: Oswald Hillock, MD  Chief Complaint: Left intertrochanteric fracture  HPI: [name] is a 64 y.o. female with history of hypertension and schizoaffective disorder discharged about 3 days ago after being admitted for acute pancreatitis cause not clear had a fall after she tripped on her dog.  Did not lose consciousness denies any chest pain.  Has been only on liquid diet since discharge after being admitted for acute pancreatitis recently. Endorses severe pain to LLE and inability to weight bear.  Ortho consulted for surgical evaluation.  Past Medical History:  Diagnosis Date  . Depression   . Diverticulosis   . GERD (gastroesophageal reflux disease)   . IBS (irritable bowel syndrome)   . Internal hemorrhoid    2nd degree  . Myocarditis ([place]) 2009  . Osteoarthritis   . Prolapsed internal hemorrhoids, grade 3 12/01/2015  . Schizoaffective disorder ([place])   . Tubular adenoma of colon 08/2011   Past Surgical History:  Procedure Laterality Date  . BREAST BIOPSY Right 12/27/2012  . CHOLECYSTECTOMY    . HEMORRHOID BANDING     Social History   Socioeconomic History  . Marital status: Divorced    Spouse name: Not on file  . Number of children: 2  . Years of education: Not on file  . Highest education level: Not on file  Occupational History  . Occupation: Disabled  Tobacco Use  . Smoking status: Former Smoker    Packs/day: 1.00    Years: 38.00    Pack years: 38.00    Types: Cigarettes    Quit date: 05/06/2018    Years since quitting: 1.7  . Smokeless tobacco: Never Used  Vaping Use  . Vaping Use: Never used  Substance and Sexual Activity  . Alcohol use: No    Alcohol/week: 0.0 standard drinks  . Drug use: No  . Sexual activity: Not Currently  Other Topics Concern  . Not on file  Social History Narrative  . Not on file   Social Determinants of Health   Financial Resource Strain:   . Difficulty of Paying  Living Expenses: Not on file  Food Insecurity:   . Worried About Charity fundraiser in the Last Year: Not on file  . Ran Out of Food in the Last Year: Not on file  Transportation Needs:   . Lack of Transportation (Medical): Not on file  . Lack of Transportation (Non-Medical): Not on file  Physical Activity:   . Days of Exercise per Week: Not on file  . Minutes of Exercise per Session: Not on file  Stress:   . Feeling of Stress : Not on file  Social Connections:   . Frequency of Communication with Friends and Family: Not on file  . Frequency of Social Gatherings with Friends and Family: Not on file  . Attends Religious Services: Not on file  . Active Member of Clubs or Organizations: Not on file  . Attends Archivist Meetings: Not on file  . Marital Status: Not on file   Family History  Problem Relation Age of Onset  . Suicidality Father   . Depression Father   . Suicidality Sister   . Depression Sister   . Suicidality Cousin   . Suicidality Other    Allergies  Allergen Reactions  . Penicillins     Did it involve swelling of the face/tongue/throat, SOB, or low BP? N Did it involve sudden or severe rash/hives, skin peeling, or any reaction  on the inside of your mouth or nose? Y Did you need to seek medical attention at a hospital or doctor's office? N When did it last happen?Several Years Ago If all above answers are "NO", may proceed with cephalosporin use.     . Sulfa Antibiotics Rash   Prior to Admission medications   Medication Sig Start Date End Date Taking? Authorizing Provider  acetaminophen (TYLENOL) 500 MG tablet Take 2,000 mg by mouth every 6 (six) hours as needed for moderate pain.   Yes [provider]  benztropine (COGENTIN) 1 MG tablet Take 1 tablet (1 mg total) by mouth daily. 11/06/19  Yes [name], MD  clobetasol (TEMOVATE) 0.05 % external solution Apply 1 application topically 2 (two) times daily.  12/13/19  Yes [provider]  cyclobenzaprine (FLEXERIL) 5 MG tablet Take 5 mg by mouth 3 (three) times daily as needed for muscle spasms.  10/27/19  Yes [provider]  fluocinonide cream (LIDEX) 8.24 % Apply 1 application topically 3 (three) times daily.  11/03/19  Yes [provider]  lipase/protease/amylase (CREON) 36000 UNITS CPEP capsule Take 1 capsule (36,000 Units total) by mouth 3 (three) times daily before meals. 01/20/20 02/19/20 Yes Donne Hazel, MD  lisinopril (ZESTRIL) 20 MG tablet Take 1 tablet (20 mg total) by mouth daily. 01/21/20 02/20/20 Yes Donne Hazel, MD  meloxicam (MOBIC) 15 MG tablet Take 15 mg by mouth daily. 01/20/20  Yes [provider]  omeprazole (PRILOSEC) 40 MG capsule Take 40 mg by mouth in the morning and at bedtime.    Yes [provider]  ondansetron (ZOFRAN) 4 MG tablet Take 4 mg by mouth every 8 (eight) hours as needed for nausea or vomiting.  09/25/19  Yes [provider]  sertraline (ZOLOFT) 100 MG tablet Take 1 tablet (100 mg total) by mouth daily. Patient taking differently: Take 50-200 mg by mouth See admin instructions. Takes 200 mg in the morning and 50 mg at night 01/15/20  Yes Donne Hazel, MD  sucralfate (CARAFATE) 1 GM/10ML suspension Take 10 mLs (1 g total) by mouth 4 (four) times daily -  with meals and at bedtime. Patient taking differently: Take 1 g by mouth 4 (four) times daily.  01/20/20 02/19/20 Yes Donne Hazel, MD  traZODone (DESYREL) 50 MG tablet Take 0.5 tablets (25 mg total) by mouth at bedtime as needed for sleep. Patient taking differently: Take 50 mg by mouth at bedtime as needed for sleep.  01/14/20  Yes Donne Hazel, MD  Vitamin D, Ergocalciferol, (DRISDOL) 1.25 MG (50000 UNIT) CAPS capsule Take 50,000 Units by mouth once a week. 11/21/19  Yes [provider]  ziprasidone (GEODON) 80 MG capsule Take 1 capsule (80 mg total) by mouth at bedtime. Patient taking differently: Take 80 mg by  mouth in the morning and at bedtime.  01/14/20  Yes Donne Hazel, MD  pantoprazole (PROTONIX) 40 MG tablet Take 1 tablet (40 mg total) by mouth daily. Patient not taking: Reported on 01/23/2020 01/21/20 02/20/20  Donne Hazel, MD  traMADol (ULTRAM) 50 MG tablet Take 1 tablet (50 mg total) by mouth every 6 (six) hours as needed for severe pain. Patient not taking: Reported on 01/23/2020 01/20/20   Donne Hazel, MD   DG Chest 1 View  Result Date: 01/23/2020 CLINICAL DATA:  Recent trip and fall with known left femoral fracture EXAM: CHEST  1 VIEW COMPARISON:  01/08/2020 FINDINGS: The heart size and mediastinal contours  are within normal limits. Both lungs are clear. The visualized skeletal structures are unremarkable. IMPRESSION: No active disease. Electronically Signed   By: Inez Catalina M.D.   On: 01/23/2020 18:56   DG Hip Unilat W or Wo Pelvis 2-3 Views Left  Result Date: 01/23/2020 CLINICAL DATA:  Tripped over dog today with hip pain, initial encounter EXAM: DG HIP (WITH OR WITHOUT PELVIS) 3V LEFT COMPARISON:  None. FINDINGS: Comminuted left intratrochanteric fracture is noted with impaction and angulation at the fracture site. Pelvic ring is otherwise intact. No soft tissue abnormality is seen. IMPRESSION: Comminuted proximal left femoral fracture involving the intratrochanteric region. Electronically Signed   By: Inez Catalina M.D.   On: 01/23/2020 18:55    All pertinent xrays, MRI, CT independently reviewed and interpreted  Positive ROS: All other systems have been reviewed and were otherwise negative with the exception of those mentioned in the HPI and as above.  Physical Exam: General: No acute distress Cardiovascular: No pedal edema Respiratory: No cyanosis, no use of accessory musculature GI: No organomegaly, abdomen is soft and non-tender Skin: No lesions in the area of chief complaint Neurologic: Sensation intact distally Psychiatric: Patient is at baseline mood and  affect Lymphatic: No axillary or cervical lymphadenopathy  MUSCULOSKELETAL:  - severe pain with movement of the hip and extremity - skin intact - NVI distally - compartments soft  Assessment: Left intertrochanteric fracture  Plan: - surgical treatment is recommended for pain relief, quality of life and early mobilization - patient and family are aware of r/b/a and wish to proceed, informed consent obtained - medical optimization per primary team - surgery is planned for sat morning - will start lovenox 12-24 hrs postop  Thank you for the consult and the opportunity to see Ms. Nazir  N. Eduard Roux, MD [facility] 7:44 AM

## 2020-01-24 NOTE — Transfer of Care (Signed)
Immediate Anesthesia Transfer of Care Note  Patient: [name]  Procedure(s) Performed: INTRAMEDULLARY (IM) NAIL INTERTROCHANTRIC (Left Hip)  Patient Location: PACU  Anesthesia Type:General  Level of Consciousness: awake, alert , oriented and patient cooperative  Airway & Oxygen Therapy: Patient Spontanous Breathing and Patient connected to face mask oxygen  Post-op Assessment: Report given to RN, Post -op Vital signs reviewed and stable and Patient moving all extremities X 4  Post vital signs: stable  Last Vitals:  Vitals Value Taken Time  BP 132/87 01/24/20 0945  Temp 36.1 C 01/24/20 0937  Pulse 66 01/24/20 0955  Resp 13 01/24/20 0955  SpO2 100 % 01/24/20 0955  Vitals shown include unvalidated device data.  Last Pain:  Vitals:   01/24/20 0945  TempSrc:   PainSc: 10-Worst pain ever      Patients Stated Pain Goal: 3 (27/61/84 8592)  Complications: No complications documented.

## 2020-01-24 NOTE — Addendum Note (Signed)
Addendum  created 01/24/20 1759 by Lissa Morales, CRNA   Clinical Note Signed, Intraprocedure Blocks edited

## 2020-01-24 NOTE — Discharge Instructions (Signed)
° ° °  1. Change dressings as needed °2. May shower but keep incisions covered and dry °3. Take lovenox to prevent blood clots °4. Take stool softeners as needed °5. Take pain meds as needed ° °

## 2020-01-24 NOTE — Addendum Note (Signed)
Addendum  created 01/24/20 1642 by Lissa Morales, CRNA   Charge Capture section accepted, Visit diagnoses modified

## 2020-01-24 NOTE — Progress Notes (Signed)
[facility]  PROGRESS NOTE  [name] RKY:[id number] DOB: [birth year] DOA: 01/23/2020 PCP: Bartholome Bill, MD   Brief HPI:   64 year old female with history of hypertension, schizoaffective disorder was discharged but 3 days ago after being admitted for acute pancreatitis unclear etiology came to hospital after patient tripped on her dog. She did not lose consciousness. In the ED x-rays revealed left hip fracture. Orthopedic surgeon Dr. Rigoberto Noel was consulted. Patient underwent open treatment of intertrochanteric fracture with intramedullary implant.   Subjective   Patient seen and examined, complains of mild pain after surgery.   Assessment/Plan:     1. Left hip fracture-s/p mechanical fall, s/p left hip fracture repair.  Pain controlled, DVT prophylaxis as per orthopedics. 2. Hypertension-blood pressure is now mildly elevated, will start home medications including lisinopril.  Continue as needed hydralazine. 3. History of schizoaffective disorder-continue Geodon, Cogentin 4. Anemia-hemoglobin chronically low, today hemoglobin is 9.5.  At baseline.  Follow CBC in a.m.    COVID-19 Labs  No results for input(s): DDIMER, FERRITIN, LDH, CRP in the last 72 hours.  Lab Results  Component Value Date   SARSCOV2NAA NEGATIVE 01/23/2020   [place] NEGATIVE 01/08/2020     Scheduled medications:   . acetaminophen  1,000 mg Oral Q6H  . benztropine  1 mg Oral Daily  . docusate sodium  100 mg Oral BID  . [START ON 01/25/2020] enoxaparin (LOVENOX) injection  40 mg Subcutaneous Q24H  . fentaNYL      . fentaNYL      . HYDROmorphone      . lipase/protease/amylase  36,000 Units Oral TID AC  . oxyCODONE  10 mg Oral Q12H  . pantoprazole  40 mg Oral BID  . povidone-iodine  2 application Topical Once  . sertraline  200 mg Oral Daily   And  . sertraline  50 mg Oral QHS  . sucralfate  1 g Oral QID  . tranexamic acid (CYKLOKAPRON) topical - INTRAOP  2,000 mg Topical To OR  .  ziprasidone  80 mg Oral BID WC         CBG: Recent Labs  Lab 01/19/20 2044 01/20/20 0014 01/20/20 0527 01/20/20 0737 01/20/20 1207  GLUCAP 107* 139* 156* 122* 167*    SpO2: 99 % O2 Flow Rate (L/min): 2 L/min    CBC: Recent Labs  Lab 01/23/20 1824 01/24/20 1146  WBC 9.5 6.4  NEUTROABS 8.3*  --   HGB 10.5* 9.5*  HCT 32.2* 28.2*  MCV 93.6 93.4  PLT 252 315    Basic Metabolic Panel: Recent Labs  Lab 01/18/20 0651 01/19/20 0605 01/20/20 0603 01/23/20 1824 01/24/20 1146  NA 139 140 144 138  --   K 3.3* 3.7 3.5 3.3*  --   CL 105 107 110 103  --   CO2 24 23 22  20*  --   GLUCOSE 133* 135* 114* 112*  --   BUN <5* <5* <5* 7*  --   CREATININE 0.42* 0.40* 0.48 0.51 0.42*  CALCIUM 8.9 9.0 9.2 9.1  --   MG  --  1.8  --   --   --      Liver Function Tests: Recent Labs  Lab 01/18/20 0651 01/19/20 0605 01/20/20 0603 01/23/20 1824  AST 20 23 24 30   ALT 17 16 19 22   ALKPHOS 68 64 60 77  BILITOT 0.8 0.8 0.8 1.5*  PROT 6.1* 6.4* 5.7* 6.6  ALBUMIN 3.0* 3.1* 2.7* 3.4*     Antibiotics: Anti-infectives (From  admission, onward)   Start     Dose/Rate Route Frequency Ordered Stop   01/24/20 1400  ceFAZolin (ANCEF) IVPB 2g/100 mL premix        2 g 200 mL/hr over 30 Minutes Intravenous Every 6 hours 01/24/20 1013 01/25/20 0759   01/24/20 0749  ceFAZolin (ANCEF) 2-4 GM/100ML-% IVPB       Note to Pharmacy: Marquis Buggy   : cabinet override      01/24/20 0749 01/24/20 1959   01/24/20 0600  ceFAZolin (ANCEF) IVPB 2g/100 mL premix        2 g 200 mL/hr over 30 Minutes Intravenous On call to O.R. 01/23/20 2024 01/24/20 0753       DVT prophylaxis: Lovenox  Code Status: Full code  Family Communication: No family at bedside    Status is: Inpatient  Dispo: The patient is from: Home              Anticipated d/c is to: Skilled nursing facility              Anticipated d/c date is: 01/26/2020              Patient currently not medically stable for  discharge  Barrier to discharge-awaiting PT evaluation          Consultants:  Orthopedic surgeon  Procedures:  Left hip fracture repair   Objective   Vitals:   01/24/20 1124 01/24/20 1201 01/24/20 1300 01/24/20 1426  BP: (!) 153/73 140/77 (!) 148/72 (!) 149/70  Pulse: 74 73 78 78  Resp: 12 14 16 17   Temp: 98.6 F (37 C) 97.6 F (36.4 C) 97.7 F (36.5 C) 97.8 F (36.6 C)  TempSrc:  Oral Axillary   SpO2: 98% 100% 100% 99%  Weight:      Height:        Intake/Output Summary (Last 24 hours) at 01/24/2020 1448 Last data filed at 01/24/2020 1320 Gross per 24 hour  Intake 2351.25 ml  Output 2725 ml  Net -373.75 ml    No intake/output data recorded.  Filed Weights   01/24/20 0612  Weight: 72.8 kg    Physical Examination:    General: Appears in no acute distress  Cardiovascular: S1-S2, regular, no murmur auscultated  Respiratory: Clear to auscultation bilaterally  Abdomen: Abdomen is soft, nontender, no organomegaly  Extremities: No edema in the lower extremities  Neurologic: Alert, oriented x3, intact insight and judgment    Data Reviewed:   Recent Results (from the past 240 hour(s))  Respiratory Panel by RT PCR (Flu A&B, Covid) - Nasopharyngeal Swab     Status: None   Collection Time: 01/23/20  6:24 PM   Specimen: Nasopharyngeal Swab  Result Value Ref Range Status   SARS Coronavirus 2 by RT PCR NEGATIVE NEGATIVE Final    Comment: (NOTE) SARS-CoV-2 target nucleic acids are NOT DETECTED.  The SARS-CoV-2 RNA is generally detectable in upper respiratoy specimens during the acute phase of infection. The lowest concentration of SARS-CoV-2 viral copies this assay can detect is 131 copies/mL. A negative result does not preclude SARS-Cov-2 infection and should not be used as the sole basis for treatment or other patient management decisions. A negative result may occur with  improper specimen collection/handling, submission of specimen  other than nasopharyngeal swab, presence of viral mutation(s) within the areas targeted by this assay, and inadequate number of viral copies (<131 copies/mL). A negative result must be combined with clinical observations, patient history, and epidemiological information. The expected result  is Negative.  Fact Sheet for Patients:  PinkCheek.be  Fact Sheet for Healthcare Providers:  GravelBags.it  This test is no t yet approved or cleared by the [place] FDA and  has been authorized for detection and/or diagnosis of SARS-CoV-2 by FDA under an Emergency Use Authorization (EUA). This EUA will remain  in effect (meaning this test can be used) for the duration of the COVID-19 declaration under Section 564(b)(1) of the Act, 21 U.S.C. section 360bbb-3(b)(1), unless the authorization is terminated or revoked sooner.     Influenza A by PCR NEGATIVE NEGATIVE Final   Influenza B by PCR NEGATIVE NEGATIVE Final    Comment: (NOTE) The Xpert Xpress SARS-CoV-2/FLU/RSV assay is intended as an aid in  the diagnosis of influenza from Nasopharyngeal swab specimens and  should not be used as a sole basis for treatment. Nasal washings and  aspirates are unacceptable for Xpert Xpress SARS-CoV-2/FLU/RSV  testing.  Fact Sheet for Patients: PinkCheek.be  Fact Sheet for Healthcare Providers: GravelBags.it  This test is not yet approved or cleared by the [place] FDA and  has been authorized for detection and/or diagnosis of SARS-CoV-2 by  FDA under an Emergency Use Authorization (EUA). This EUA will remain  in effect (meaning this test can be used) for the duration of the  Covid-19 declaration under Section 564(b)(1) of the Act, 21  U.S.C. section 360bbb-3(b)(1), unless the authorization is  terminated or revoked. Performed at [facility], [place] [address], [place], [place] [address]   Surgical pcr screen     Status: Abnormal   Collection Time: 01/24/20  6:13 AM   Specimen: Nasal Mucosa; Nasal Swab  Result Value Ref Range Status   MRSA, PCR NEGATIVE NEGATIVE Final   Staphylococcus aureus POSITIVE (A) NEGATIVE Final    Comment: (NOTE) The Xpert SA Assay (FDA approved for NASAL specimens in patients 68 years of age and older), is one component of a comprehensive surveillance program. It is not intended to diagnose infection nor to guide or monitor treatment. Performed at [facility], [place] [address], [place], [place] [address]     No results for input(s): LIPASE, AMYLASE in the last 168 hours. No results for input(s): AMMONIA in the last 168 hours.  Cardiac Enzymes: No results for input(s): CKTOTAL, CKMB, CKMBINDEX, TROPONINI in the last 168 hours. BNP (last 3 results) Recent Labs    01/08/20 1450  BNP 76.0    ProBNP (last 3 results) No results for input(s): PROBNP in the last 8760 hours.  Studies:  DG Chest 1 View  Result Date: 01/23/2020 CLINICAL DATA:  Recent trip and fall with known left femoral fracture EXAM: CHEST  1 VIEW COMPARISON:  01/08/2020 FINDINGS: The heart size and mediastinal contours are within normal limits. Both lungs are clear. The visualized skeletal structures are unremarkable. IMPRESSION: No active disease. Electronically Signed   By: Inez Catalina M.D.   On: 01/23/2020 18:56   DG C-Arm 1-60 Min-No Report  Result Date: 01/24/2020 Fluoroscopy was utilized by the requesting physician.  No radiographic interpretation.   DG HIP OPERATIVE UNILAT W OR W/O PELVIS LEFT  Result Date: 01/24/2020 CLINICAL DATA:  Left hip fracture repair FLUOROSCOPY TIME:  1 minutes 2 seconds. Images: 4 EXAM: OPERATIVE LEFT HIP (WITH PELVIS IF PERFORMED) 4 VIEWS TECHNIQUE: Fluoroscopic spot image(s) were submitted for interpretation post-operatively. COMPARISON:  None. FINDINGS: Two gamma nails have been placed  across the intertrochanteric left hip fracture. An intramedullary femoral rod has been  placed as well, affixed distally with an interlocking screw. IMPRESSION: Left hip fracture repair as above. Hardware in good position as above. Electronically Signed   By: Dorise Bullion III M.D   On: 01/24/2020 10:55   DG Hip Unilat W or Wo Pelvis 2-3 Views Left  Result Date: 01/23/2020 CLINICAL DATA:  Tripped over dog today with hip pain, initial encounter EXAM: DG HIP (WITH OR WITHOUT PELVIS) 3V LEFT COMPARISON:  None. FINDINGS: Comminuted left intratrochanteric fracture is noted with impaction and angulation at the fracture site. Pelvic ring is otherwise intact. No soft tissue abnormality is seen. IMPRESSION: Comminuted proximal left femoral fracture involving the intratrochanteric region. Electronically Signed   By: Inez Catalina M.D.   On: 01/23/2020 18:55       [place]   [facility] If 7PM-7AM, please contact night-coverage at [email or web address], Office  [phone]   01/24/2020, 2:48 PM  LOS: 1 day

## 2020-01-24 NOTE — Anesthesia Procedure Notes (Addendum)
Procedure Name: Intubation Date/Time: 01/24/2020 8:00 AM Performed by: Lissa Morales, CRNA Pre-anesthesia Checklist: Patient identified, Emergency Drugs available, Suction available and Patient being monitored Patient Re-evaluated:Patient Re-evaluated prior to induction Oxygen Delivery Method: Circle system utilized Preoxygenation: Pre-oxygenation with 100% oxygen Induction Type: IV induction, Cricoid Pressure applied and Rapid sequence Laryngoscope Size: Mac and 4 Tube type: Oral Tube size: 7.5 mm Number of attempts: 1 Airway Equipment and Method: Stylet and Oral airway Placement Confirmation: ETT inserted through vocal cords under direct vision,  positive ETCO2 and breath sounds checked- equal and bilateral Secured at: 20 cm Tube secured with: Tape Dental Injury: Teeth and Oropharynx as per pre-operative assessment

## 2020-01-24 NOTE — Progress Notes (Signed)
Initial Nutrition Assessment  DOCUMENTATION CODES:   Not applicable  INTERVENTION:  Monitor for diet advancement, when appropriate -Boost Breeze po BID, each supplement provides 250 kcal and 9 grams of protein  -Ensure Enlive po BID, each supplement provides 350 kcal and 20 grams of protein  -Juven BID, each packet provides 95 calories, 2.5 grams of protein (collagen), and 9.8 grams of carbohydrate (3 grams sugar); also contains 7 grams of L-arginine and L-glutamine, 300 mg vitamin C, 15 mg vitamin E, 1.2 mcg vitamin B-12, 9.5 mg zinc, 200 mg calcium, and 1.5 g  Calcium Beta-hydroxy-Beta-methylbutyrate to support post-op wound healing   NUTRITION DIAGNOSIS:   Increased nutrient needs related to post-op healing, hip fracture as evidenced by estimated needs.    GOAL:   Patient will meet greater than or equal to 90% of their needs    MONITOR:   Labs, I & O's, Diet advancement, Supplement acceptance, Weight trends, PO intake  REASON FOR ASSESSMENT:   Malnutrition Screening Tool, Consult Assessment of nutrition requirement/status (hip fracture)  ASSESSMENT: 64 year old female with history of schizoaffective disorder, depression, IBS, prolapsed internal hemorrhoids grade III, tubular adenoma of colon, diverticulosis, HTN, osteoarthritis, myocarditis, who was discharged last week after being admitted for acute pancreatitis. She presented after a fall at home and admitted with closed intertrochanteric fracture of left femur.  RD working remotely.  Pt evaluated by orthopedics, surgical repair recommended. Unable to contact pt via phone at this time as she is currently off floor for procedure. Pt was recently seen by [facility] RD during previous admission for acute pancreatitis. Per RD note on 10/5, she reported unable to tolerate PO for ~ 2 weeks prior to admission r/t constipation, abdominal pain, and  N/V. Pt tolerating clears, started on Creon and diet advanced to fulls prior to d/c on  10/12. Per H&P, she has been on CLD since returning home. Her weights have trended down ~4 lbs (2.4%) in the last 7 days; significant. Given recent history r/t acute pancreatitis and trends, suspect degree of acute malnutrition. Will plan to perform exam at f/u. Pt noted to like [employer], will monitor for diet advancement and order supplements as appropriate.   Medications reviewed and include: Creon 36,000 before meals, Carafate, Geodon, Ancef, Cyklokapron  IVF: NaCl @ 75 ml/hr  Labs: K 3.3 (L), BUN 7 (L)  NUTRITION - FOCUSED PHYSICAL EXAM: Unable to complete at this time, RD working remotely. 10/5 Exam - mild clavicle depletion  Diet Order:   Diet Order            Diet NPO time specified Except for: Sips with Meds  Diet effective midnight                 EDUCATION NEEDS:   No education needs have been identified at this time  Skin:  Skin Assessment: Skin Integrity Issues: Skin Integrity Issues:: Incisions Incisions: closed; left hip  Last BM:  pta  Height:   Ht Readings from Last 1 Encounters:  01/24/20 5\' 2"  (1.575 m)    Weight:   Wt Readings from Last 1 Encounters:  01/24/20 72.8 kg    BMI:  Body mass index is 29.35 kg/m.  Estimated Nutritional Needs:   Kcal:  1850-2050  Protein:  85-100  Fluid:  > 1.8 L/day   Lajuan Lines, RD, LDN Clinical Nutrition After Hours/Weekend Pager # in [place]

## 2020-01-25 DIAGNOSIS — E876 Hypokalemia: Secondary | ICD-10-CM | POA: Diagnosis not present

## 2020-01-25 DIAGNOSIS — S72142A Displaced intertrochanteric fracture of left femur, initial encounter for closed fracture: Secondary | ICD-10-CM | POA: Diagnosis not present

## 2020-01-25 DIAGNOSIS — I10 Essential (primary) hypertension: Secondary | ICD-10-CM | POA: Diagnosis not present

## 2020-01-25 LAB — BASIC METABOLIC PANEL
Anion gap: 12 (ref 5–15)
Anion gap: 10 (ref 5–15)
BUN: <5 mg/dL — ABNORMAL LOW (ref 8–23)
BUN: <5 mg/dL — ABNORMAL LOW (ref 8–23)
CO2: 27 mmol/L (ref 22–32)
CO2: 25 mmol/L (ref 22–32)
Calcium: 8.4 mg/dL — ABNORMAL LOW (ref 8.9–10.3)
Calcium: 8.3 mg/dL — ABNORMAL LOW (ref 8.9–10.3)
Chloride: 104 mmol/L (ref 98–111)
Chloride: 101 mmol/L (ref 98–111)
Creatinine, Ser: 0.44 mg/dL (ref 0.44–1.00)
Creatinine, Ser: 0.42 mg/dL — ABNORMAL LOW (ref 0.44–1.00)
GFR, Estimated: >60 mL/min (ref >60)
GFR, Estimated: >60 mL/min (ref >60)
Glucose, Bld: 116 mg/dL — ABNORMAL HIGH (ref 70–99)
Glucose, Bld: 180 mg/dL — ABNORMAL HIGH (ref 70–99)
Potassium: 2.4 mmol/L — CL (ref 3.5–5.1)
Potassium: 2.9 mmol/L — ABNORMAL LOW (ref 3.5–5.1)
Sodium: 143 mmol/L (ref 135–145)
Sodium: 136 mmol/L (ref 135–145)

## 2020-01-25 LAB — CBC
HCT: 23.9 % — ABNORMAL LOW (ref 36.0–46.0)
Hemoglobin: 8.1 g/dL — ABNORMAL LOW (ref 12.0–15.0)
MCH: 31.4 pg (ref 26.0–34.0)
MCHC: 33.9 g/dL (ref 30.0–36.0)
MCV: 92.6 fL (ref 80.0–100.0)
Platelets: 187 10*3/uL (ref 150–400)
RBC: 2.58 MIL/uL — ABNORMAL LOW (ref 3.87–5.11)
RDW: 16.6 % — ABNORMAL HIGH (ref 11.5–15.5)
WBC: 5.7 10*3/uL (ref 4.0–10.5)
nRBC: 0 % (ref 0.0–0.2)

## 2020-01-25 LAB — LACTIC ACID, PLASMA
Lactic Acid, Venous: 1.1 mmol/L (ref 0.5–1.9)
Lactic Acid, Venous: 1 mmol/L (ref 0.5–1.9)

## 2020-01-25 LAB — GLUCOSE, CAPILLARY: Glucose-Capillary: 154 mg/dL — ABNORMAL HIGH (ref 70–99)

## 2020-01-25 LAB — MAGNESIUM: Magnesium: 1.4 mg/dL — ABNORMAL LOW (ref 1.7–2.4)

## 2020-01-25 LAB — TROPONIN I (HIGH SENSITIVITY)
Troponin I (High Sensitivity): 6 ng/L (ref <18)
Troponin I (High Sensitivity): 4 ng/L (ref <18)

## 2020-01-25 MED ORDER — POTASSIUM CHLORIDE 10 MEQ/100ML IV SOLN
10.0000 meq | INTRAVENOUS | Status: AC
  Administered 2020-01-25 (×2): 10 meq via INTRAVENOUS
  Filled 2020-01-25 (×2): qty 100

## 2020-01-25 MED ORDER — POTASSIUM CHLORIDE 10 MEQ/100ML IV SOLN
10.0000 meq | INTRAVENOUS | Status: AC
  Administered 2020-01-25 (×3): 10 meq via INTRAVENOUS
  Filled 2020-01-25 (×3): qty 100

## 2020-01-25 MED ORDER — SODIUM CHLORIDE 0.9 % IV BOLUS
500.0000 mL | Freq: Once | INTRAVENOUS | Status: AC
  Administered 2020-01-25: 500 mL via INTRAVENOUS

## 2020-01-25 MED ORDER — SODIUM CHLORIDE 0.9 % IV SOLN: INTRAVENOUS | Status: AC

## 2020-01-25 MED ORDER — MAGNESIUM SULFATE 4 GM/100ML IV SOLN
4.0000 g | Freq: Once | INTRAVENOUS | Status: AC
  Administered 2020-01-25: 4 g via INTRAVENOUS
  Filled 2020-01-25: qty 100

## 2020-01-25 MED ORDER — POTASSIUM CHLORIDE 10 MEQ/100ML IV SOLN
10.0000 meq | INTRAVENOUS | Status: DC
Start: 2020-01-25 — End: 2020-01-25

## 2020-01-25 MED ORDER — POTASSIUM CHLORIDE CRYS ER 20 MEQ PO TBCR
40.0000 meq | EXTENDED_RELEASE_TABLET | Freq: Once | ORAL | Status: AC
  Administered 2020-01-25: 40 meq via ORAL
  Filled 2020-01-25: qty 2

## 2020-01-25 NOTE — Progress Notes (Signed)
[facility]  PROGRESS NOTE  [name] UKG:[id number] DOB: 1955-08-23 DOA: 01/23/2020 PCP: Bartholome Bill, MD   Brief HPI:   64 year old female with history of hypertension, schizoaffective disorder was discharged but 3 days ago after being admitted for acute pancreatitis unclear etiology came to hospital after patient tripped on her dog. She did not lose consciousness. In the ED x-rays revealed left hip fracture. Orthopedic surgeon Dr. Rigoberto Noel was consulted. Patient underwent open treatment of intertrochanteric fracture with intramedullary implant.   Subjective   Patient seen and examined, denies any pain.  No shortness of breath.   Assessment/Plan:     1. Left hip fracture-s/p mechanical fall, s/p left hip fracture repair.  Pain controlled, DVT prophylaxis as per orthopedics. 2. Hypertension-blood pressure is well controlled, continue lisinopril, as needed hydralazine.  3. Hypokalemia-potassium is significantly reduced today 2.4,.  Will give IV KCl 10 mg x 5.  Check serum magnesium. 4. History of schizoaffective disorder-continue Geodon, Cogentin 5. Anemia-hemoglobin chronically low, today hemoglobin is 8.1, dropped from 9.5 yesterday.  Follow hemoglobin in a.m.  Transfuse for hemoglobin less than 7.   COVID-19 Labs  No results for input(s): DDIMER, FERRITIN, LDH, CRP in the last 72 hours.  Lab Results  Component Value Date   SARSCOV2NAA NEGATIVE 01/23/2020   [place] NEGATIVE 01/08/2020     Scheduled medications:   . acetaminophen  1,000 mg Oral Q6H  . benztropine  1 mg Oral Daily  . docusate sodium  100 mg Oral BID  . enoxaparin (LOVENOX) injection  40 mg Subcutaneous Q24H  . lipase/protease/amylase  36,000 Units Oral TID AC  . lisinopril  20 mg Oral Daily  . mupirocin ointment   Nasal BID  . nicotine  14 mg Transdermal Daily  . oxyCODONE  10 mg Oral Q12H  . pantoprazole  40 mg Oral BID  . povidone-iodine  2 application Topical Once  . sertraline   200 mg Oral Daily   And  . sertraline  50 mg Oral QHS  . sucralfate  1 g Oral QID  . ziprasidone  80 mg Oral BID WC         CBG: Recent Labs  Lab 01/19/20 2044 01/20/20 0014 01/20/20 0527 01/20/20 0737 01/20/20 1207  GLUCAP 107* 139* 156* 122* 167*    SpO2: 100 % O2 Flow Rate (L/min): 2 L/min    CBC: Recent Labs  Lab 01/23/20 1824 01/24/20 1146 01/25/20 0346  WBC 9.5 6.4 5.7  NEUTROABS 8.3*  --   --   HGB 10.5* 9.5* 8.1*  HCT 32.2* 28.2* 23.9*  MCV 93.6 93.4 92.6  PLT 252 189 762    Basic Metabolic Panel: Recent Labs  Lab 01/19/20 0605 01/20/20 0603 01/23/20 1824 01/24/20 1146 01/25/20 0346  NA 140 144 138  --  143  K 3.7 3.5 3.3*  --  2.4*  CL 107 110 103  --  104  CO2 23 22 20*  --  27  GLUCOSE 135* 114* 112*  --  116*  BUN <5* <5* 7*  --  <5*  CREATININE 0.40* 0.48 0.51 0.42* 0.44  CALCIUM 9.0 9.2 9.1  --  8.4*  MG 1.8  --   --   --   --      Liver Function Tests: Recent Labs  Lab 01/19/20 0605 01/20/20 0603 01/23/20 1824  AST 23 24 30   ALT 16 19 22   ALKPHOS 64 60 77  BILITOT 0.8 0.8 1.5*  PROT 6.4* 5.7* 6.6  ALBUMIN 3.1* 2.7* 3.4*     Antibiotics: Anti-infectives (From admission, onward)   Start     Dose/Rate Route Frequency Ordered Stop   01/24/20 1400  ceFAZolin (ANCEF) IVPB 2g/100 mL premix        2 g 200 mL/hr over 30 Minutes Intravenous Every 6 hours 01/24/20 1013 01/25/20 0210   01/24/20 0749  ceFAZolin (ANCEF) 2-4 GM/100ML-% IVPB       Note to Pharmacy: Marquis Buggy   : cabinet override      01/24/20 0749 01/24/20 1959   01/24/20 0600  ceFAZolin (ANCEF) IVPB 2g/100 mL premix        2 g 200 mL/hr over 30 Minutes Intravenous On call to O.R. 01/23/20 2024 01/24/20 0753       DVT prophylaxis: Lovenox  Code Status: Full code  Family Communication: No family at bedside    Status is: Inpatient  Dispo: The patient is from: Home              Anticipated d/c is to: Skilled nursing facility               Anticipated d/c date is: 01/26/2020              Patient currently not medically stable for discharge  Barrier to discharge-awaiting PT evaluation      Consultants:  Orthopedic surgeon  Procedures:  Left hip fracture repair   Objective   Vitals:   01/24/20 1426 01/24/20 2155 01/25/20 0208 01/25/20 0651  BP: (!) 149/70 (!) 117/55 112/80 (!) 115/55  Pulse: 78 77 68 68  Resp: 17 16 16 16   Temp: 97.8 F (36.6 C) 98.6 F (37 C) 98.7 F (37.1 C) 97.9 F (36.6 C)  TempSrc:  Oral Oral Oral  SpO2: 99% 100% 98% 100%  Weight:      Height:        Intake/Output Summary (Last 24 hours) at 01/25/2020 0856 Last data filed at 01/25/2020 6606 Gross per 24 hour  Intake 3145.17 ml  Output 3100 ml  Net 45.17 ml    10/15 1901 - 10/17 0700 In: 3664.6 [P.O.:480; I.V.:2434.6] Out: 4100 [Urine:4000]  Filed Weights   01/24/20 0612  Weight: 72.8 kg    Physical Examination:  General-appears in no acute distress Heart-S1-S2, regular, no murmur auscultated Lungs-clear to auscultation bilaterally, no wheezing or crackles auscultated Abdomen-soft, nontender, no organomegaly Extremities-no edema in the lower extremities Neuro-alert, oriented x3, no focal deficit noted   Data Reviewed:   Recent Results (from the past 240 hour(s))  Respiratory Panel by RT PCR (Flu A&B, Covid) - Nasopharyngeal Swab     Status: None   Collection Time: 01/23/20  6:24 PM   Specimen: Nasopharyngeal Swab  Result Value Ref Range Status   SARS Coronavirus 2 by RT PCR NEGATIVE NEGATIVE Final    Comment: (NOTE) SARS-CoV-2 target nucleic acids are NOT DETECTED.  The SARS-CoV-2 RNA is generally detectable in upper respiratoy specimens during the acute phase of infection. The lowest concentration of SARS-CoV-2 viral copies this assay can detect is 131 copies/mL. A negative result does not preclude SARS-Cov-2 infection and should not be used as the sole basis for treatment or other patient management  decisions. A negative result may occur with  improper specimen collection/handling, submission of specimen other than nasopharyngeal swab, presence of viral mutation(s) within the areas targeted by this assay, and inadequate number of viral copies (<131 copies/mL). A negative result must be combined with clinical observations, patient history, and epidemiological  information. The expected result is Negative.  Fact Sheet for Patients:  PinkCheek.be  Fact Sheet for Healthcare Providers:  GravelBags.it  This test is no t yet approved or cleared by the [place] FDA and  has been authorized for detection and/or diagnosis of SARS-CoV-2 by FDA under an Emergency Use Authorization (EUA). This EUA will remain  in effect (meaning this test can be used) for the duration of the COVID-19 declaration under Section 564(b)(1) of the Act, 21 U.S.C. section 360bbb-3(b)(1), unless the authorization is terminated or revoked sooner.     Influenza A by PCR NEGATIVE NEGATIVE Final   Influenza B by PCR NEGATIVE NEGATIVE Final    Comment: (NOTE) The Xpert Xpress SARS-CoV-2/FLU/RSV assay is intended as an aid in  the diagnosis of influenza from Nasopharyngeal swab specimens and  should not be used as a sole basis for treatment. Nasal washings and  aspirates are unacceptable for Xpert Xpress SARS-CoV-2/FLU/RSV  testing.  Fact Sheet for Patients: PinkCheek.be  Fact Sheet for Healthcare Providers: GravelBags.it  This test is not yet approved or cleared by the [place] FDA and  has been authorized for detection and/or diagnosis of SARS-CoV-2 by  FDA under an Emergency Use Authorization (EUA). This EUA will remain  in effect (meaning this test can be used) for the duration of the  Covid-19 declaration under Section 564(b)(1) of the Act, 21  U.S.C. section 360bbb-3(b)(1), unless the  authorization is  terminated or revoked. Performed at [facility], [place] [address], [place], [place] [address]   Surgical pcr screen     Status: Abnormal   Collection Time: 01/24/20  6:13 AM   Specimen: Nasal Mucosa; Nasal Swab  Result Value Ref Range Status   MRSA, PCR NEGATIVE NEGATIVE Final   Staphylococcus aureus POSITIVE (A) NEGATIVE Final    Comment: (NOTE) The Xpert SA Assay (FDA approved for NASAL specimens in patients 52 years of age and older), is one component of a comprehensive surveillance program. It is not intended to diagnose infection nor to guide or monitor treatment. Performed at [facility], [place] [address], [place], [place] [address]     No results for input(s): LIPASE, AMYLASE in the last 168 hours. No results for input(s): AMMONIA in the last 168 hours.  Cardiac Enzymes: No results for input(s): CKTOTAL, CKMB, CKMBINDEX, TROPONINI in the last 168 hours. BNP (last 3 results) Recent Labs    01/08/20 1450  BNP 76.0    ProBNP (last 3 results) No results for input(s): PROBNP in the last 8760 hours.  Studies:  DG Chest 1 View  Result Date: 01/23/2020 CLINICAL DATA:  Recent trip and fall with known left femoral fracture EXAM: CHEST  1 VIEW COMPARISON:  01/08/2020 FINDINGS: The heart size and mediastinal contours are within normal limits. Both lungs are clear. The visualized skeletal structures are unremarkable. IMPRESSION: No active disease. Electronically Signed   By: Inez Catalina M.D.   On: 01/23/2020 18:56   DG C-Arm 1-60 Min-No Report  Result Date: 01/24/2020 Fluoroscopy was utilized by the requesting physician.  No radiographic interpretation.   DG HIP OPERATIVE UNILAT W OR W/O PELVIS LEFT  Result Date: 01/24/2020 CLINICAL DATA:  Left hip fracture repair FLUOROSCOPY TIME:  1 minutes 2 seconds. Images: 4 EXAM: OPERATIVE LEFT HIP (WITH PELVIS IF PERFORMED) 4 VIEWS TECHNIQUE: Fluoroscopic spot image(s) were  submitted for interpretation post-operatively. COMPARISON:  None. FINDINGS: Two gamma nails have been placed across the intertrochanteric left hip fracture. An intramedullary  femoral rod has been placed as well, affixed distally with an interlocking screw. IMPRESSION: Left hip fracture repair as above. Hardware in good position as above. Electronically Signed   By: Dorise Bullion III M.D   On: 01/24/2020 10:55   DG Hip Unilat W or Wo Pelvis 2-3 Views Left  Result Date: 01/23/2020 CLINICAL DATA:  Tripped over dog today with hip pain, initial encounter EXAM: DG HIP (WITH OR WITHOUT PELVIS) 3V LEFT COMPARISON:  None. FINDINGS: Comminuted left intratrochanteric fracture is noted with impaction and angulation at the fracture site. Pelvic ring is otherwise intact. No soft tissue abnormality is seen. IMPRESSION: Comminuted proximal left femoral fracture involving the intratrochanteric region. Electronically Signed   By: Inez Catalina M.D.   On: 01/23/2020 18:55       [place]   [facility] If 7PM-7AM, please contact night-coverage at [email or web address], Office  [phone]   01/25/2020, 8:56 AM  LOS: 2 days

## 2020-01-25 NOTE — Evaluation (Signed)
Physical Therapy Evaluation Patient Details Name: [name] MRN: [id number] DOB: 19-Feb-1956 Today's Date: 01/25/2020   History of Present Illness  64 year old female with history of hypertension, schizoaffective disorder was discharged but 3 days ago after being admitted for acute pancreatitis unclear etiology came to hospital after patient tripped on her dog. xrays= L IT hip fx. s/p L IM nail per Dr. Erlinda Hong 01/24/20.  Clinical Impression  Pt admitted with above diagnosis.  Pt limited today d/t being extremely sleepy, decr ability  to follow basic commands/slow processing (likely d/t meds) Will need SNF post acute to allow return to independence. Will continue to follow in acute setting. Pt currently with functional limitations due to the deficits listed below (see PT Problem List). Pt will benefit from skilled PT to increase their independence and safety with mobility to allow discharge to the venue listed below.       Follow Up Recommendations SNF    Equipment Recommendations  None recommended by PT    Recommendations for Other Services       Precautions / Restrictions Precautions Precautions: Fall Precaution Comments: pt with hx of multiple falls Restrictions Weight Bearing Restrictions: No LLE Weight Bearing: Weight bearing as tolerated      Mobility  Bed Mobility Overal bed mobility: Needs Assistance Bed Mobility: Sit to Supine     Supine to sit: Mod assist Sit to supine: Mod assist;+2 for safety/equipment   General bed mobility comments: cues for technique, assist to lower trunk and lift LEs on to bed. incr time. pt able to self assist with scooting sitting EOB  Transfers Overall transfer level: Needs assistance Equipment used: Rolling walker (2 wheeled);None Transfers: Sit to/from [facility] Sit to Stand: Mod assist;+2 safety/equipment;+2 physical assistance Stand pivot transfers: Mod assist;+2 physical assistance;+2 safety/equipment        General transfer comment: pt requiring assist to rise and transition to RW, incr time/slow processing, requires multi-modal cues for sequencing, wt shift, RW position  Ambulation/Gait             General Gait Details: unable to take steps today, pt extremely sleepy and with difficulty following commands/sequencing  Stairs            Wheelchair Mobility    Modified Rankin (Stroke Patients Only)       Balance Overall balance assessment: Needs assistance Sitting-balance support: Feet supported;No upper extremity supported Sitting balance-Leahy Scale: Fair       Standing balance-Leahy Scale: Poor Standing balance comment: reliant on UEs and external assist                             Pertinent Vitals/Pain Pain Assessment: Faces Faces Pain Scale: Hurts little more Pain Location: L hip Pain Descriptors / Indicators: Grimacing;Sore Pain Intervention(s): Limited activity within patient's tolerance;Monitored during session;Repositioned;Ice applied    Home Living Family/patient expects to be discharged to:: Skilled nursing facility Living Arrangements: Alone                    Prior Function Level of Independence: Independent with assistive device(s)         Comments: ambulates with her mother's old walker, independent in self care and light housekeeping and meal prep, aunt and uncle help with errands and housework     Hand Dominance   Dominant Hand: Right    Extremity/Trunk Assessment   Upper Extremity Assessment Upper Extremity Assessment: Overall WFL for tasks assessed;Defer to  OT evaluation    Lower Extremity Assessment Lower Extremity Assessment: LLE deficits/detail LLE Deficits / Details: ankle WFL, knee and hip grossly 2=?5. limited by post op pain and weakness LLE: Unable to fully assess due to pain    Cervical / Trunk Assessment Cervical / Trunk Assessment: Normal  Communication   Communication: No difficulties   Cognition Arousal/Alertness: Lethargic;Suspect due to medications Behavior During Therapy: [facility] for tasks assessed/performed Overall Cognitive Status: Impaired/Different from baseline Area of Impairment: Following commands;Problem solving;Attention                   Current Attention Level: Sustained   Following Commands: Follows one step commands with increased time;Follows multi-step commands inconsistently     Problem Solving: Slow processing;Difficulty sequencing;Requires verbal cues;Requires tactile cues General Comments: somewhat slow processing      General Comments      Exercises     Assessment/Plan    PT Assessment Patient needs continued PT services  PT Problem List Decreased strength;Decreased range of motion;Decreased activity tolerance;Decreased knowledge of use of DME;Pain;Decreased mobility;Decreased balance       PT Treatment Interventions DME instruction;Therapeutic exercise;Functional mobility training;Therapeutic activities;Patient/family education;Gait training    PT Goals (Current goals can be found in the Care Plan section)  Acute Rehab PT Goals Patient Stated Goal: get stronger in rehab and go home to her dog PT Goal Formulation: With patient Time For Goal Achievement: 02/08/20 Potential to Achieve Goals: Good    Frequency Min 3X/week   Barriers to discharge        Co-evaluation               AM-PAC PT "6 Clicks" Mobility  Outcome Measure Help needed turning from your back to your side while in a flat bed without using bedrails?: A Lot Help needed moving from lying on your back to sitting on the side of a flat bed without using bedrails?: A Lot Help needed moving to and from a bed to a chair (including a wheelchair)?: A Lot Help needed standing up from a chair using your arms (e.g., wheelchair or bedside chair)?: A Lot Help needed to walk in hospital room?: Total Help needed climbing 3-5 steps with a railing? : Total 6 Click  Score: 10    End of Session Equipment Utilized During Treatment: Gait belt Activity Tolerance: Patient tolerated treatment well Patient left: with call bell/phone within reach;in bed;with bed alarm set Nurse Communication: Mobility status PT Visit Diagnosis: Other abnormalities of gait and mobility (R26.89)    Time: 7622-6333 PT Time Calculation (min) (ACUTE ONLY): 16 min   Charges:   PT Evaluation $PT Eval Low Complexity: [place], PT  Acute Rehab Dept ([place]) [phone] Pager [phone]  01/25/2020   [facility] 01/25/2020, 1:42 PM

## 2020-01-25 NOTE — Plan of Care (Signed)
Plan of care reviewed and discussed with the patient. 

## 2020-01-25 NOTE — Progress Notes (Signed)
CRITICAL VALUE ALERT  Critical Value:  Potassium 2.4  Date & Time Notied:  01/25/20 at 0440  Provider Notified: primary nurse Mortimer Fries) made aware- will contact on-call provider via amion  Orders Received/Actions taken: awaiting orders- will update

## 2020-01-25 NOTE — Plan of Care (Signed)
  Problem: Education: Goal: Knowledge of General Education information will improve Description Including pain rating scale, medication(s)/side effects and non-pharmacologic comfort measures Outcome: Progressing   Problem: Clinical Measurements: Goal: Ability to maintain clinical measurements within normal limits will improve Outcome: Progressing   Problem: Clinical Measurements: Goal: Will remain free from infection Outcome: Progressing   

## 2020-01-25 NOTE — Progress Notes (Signed)
New orders received, implemented.

## 2020-01-25 NOTE — Progress Notes (Addendum)
Rapid Response Event Note   Reason for Call : Lethargic patient with low b/p   Initial Focused Assessment: Lethargic but oriented, 76/44, 98% 2L, SR 60's   Interventions: Pt placed on 2L, EKG, 552ml bolus all started by bedside RN upon arrival   Plan of Care: MD at bedside, holding lisinopril and NS at 125 started for 6 hours   Event Summary: Pt more awake and alert B/P increased MD Notified:  Call Time:1416 Arrival CJAR:0110 End Time:1520  Aelyn Stanaland, Theodosia Paling, RN

## 2020-01-25 NOTE — Progress Notes (Addendum)
Patient stable No events Pain well-controlled mainly complaining of soreness around the incisions Hemoglobin stable Surgical dressings are clean dry and intact mobilize with PT today Dispo pending PT evaluation but likely will need SNF placement.

## 2020-01-25 NOTE — Progress Notes (Signed)
   01/25/20 1415  Assess: MEWS Score  BP (!) 76/44  Pulse Rate 63  Resp 16  Level of Consciousness Responds to Voice  SpO2 (!) 88 %  O2 Device Room Air  Assess: MEWS Score  MEWS Temp 0  MEWS Systolic 2  MEWS Pulse 0  MEWS RR 0  MEWS LOC 1  MEWS Score 3  MEWS Score Color Yellow  Treat  MEWS Interventions Escalated (See documentation below);Administered prn meds/treatments;Other (Comment) (RRT notified)  Pain Scale 0-10  Pain Score 0  Take Vital Signs  Increase Vital Sign Frequency  Yellow: Q 2hr X 2 then Q 4hr X 2, if remains yellow, continue Q 4hrs (Q 5 min until bp stable)  Escalate  MEWS: Escalate Yellow: discuss with charge nurse/RN and consider discussing with provider and RRT  Notify: Charge Nurse/RN  Name of Charge Nurse/RN Notified Reche [place]   Date Charge Nurse/RN Notified 01/25/20  Time Charge Nurse/RN Notified 1415  Patient Bp low, drowsy. RRT notified, EKG done, blood sugar checked, physician notified, mews documented. Patient doing better, continuing to monitor. Deanna Artis, Rn

## 2020-01-25 NOTE — Progress Notes (Signed)
        [name] 01/25/2020,3:14 PM

## 2020-01-25 NOTE — Evaluation (Signed)
Occupational Therapy Evaluation Patient Details Name: [name] MRN: [id number] DOB: November 02, 1955 Today's Date: 01/25/2020    History of Present Illness 64 year old female with history of hypertension, schizoaffective disorder was discharged but 3 days ago after being admitted for acute pancreatitis unclear etiology came to hospital after patient tripped on her dog. xrays= L IT hip fx. s/p L IM nail per Dr. Erlinda Hong 01/24/20.   Clinical Impression   Pt was living alone, ambulating with a RW and caring her herself with intermittent assist of her aunt and uncle prior to admission. She endorses multiple falls. Pt presents with L hip pain, generalized weakness and inability to stand with RW this visit. Pt maintaining L LE flexed and internally rotated. Pt transferred face to face with max assist. She will need post acute rehab in SNF and is agreeable to this plan. Will follow acutely.     Follow Up Recommendations  SNF;Supervision/Assistance - 24 hour    Equipment Recommendations  Other (comment) (defer to next venue)    Recommendations for Other Services       Precautions / Restrictions Precautions Precautions: Fall Precaution Comments: pt with hx of multiple falls Restrictions Weight Bearing Restrictions: Yes LLE Weight Bearing: Weight bearing as tolerated      Mobility Bed Mobility Overal bed mobility: Needs Assistance Bed Mobility: Supine to Sit     Supine to sit: Mod assist     General bed mobility comments: cues for technique, assist for L LE over EOB and to raise trunk, pt able to scoot hips to EOB with increased time  Transfers Overall transfer level: Needs assistance Equipment used: Rolling walker (2 wheeled);None Transfers: Sit to/from [facility] Sit to Stand: Max assist;From elevated surface Stand pivot transfers: Max assist       General transfer comment: attempted to stand from elevated bed with RW without success, pt maintaining L LE flexed  and internally rotated, transferred face to face from bed to chair    Balance Overall balance assessment: Needs assistance   Sitting balance-Leahy Scale: Fair       Standing balance-Leahy Scale: Zero                             ADL either performed or assessed with clinical judgement   ADL Overall ADL's : Needs assistance/impaired Eating/Feeding: Independent   Grooming: Brushing hair;Sitting;Set up   Upper Body Bathing: Minimal assistance;Sitting   Lower Body Bathing: Total assistance;Sit to/from stand   Upper Body Dressing : Minimal assistance;Sitting   Lower Body Dressing: Total assistance;Sit to/from stand   Toilet Transfer: Maximal assistance;Stand-pivot;BSC   Toileting- Clothing Manipulation and Hygiene: Total assistance;Sit to/from stand               Vision Patient Visual Report: No change from baseline       Perception     Praxis      Pertinent Vitals/Pain Pain Assessment: Faces Faces Pain Scale: Hurts even more Pain Location: L hip Pain Descriptors / Indicators: Sharp Pain Intervention(s): Monitored during session;Repositioned;Ice applied     Hand Dominance Right   Extremity/Trunk Assessment Upper Extremity Assessment Upper Extremity Assessment: Overall WFL for tasks assessed;Generalized weakness   Lower Extremity Assessment Lower Extremity Assessment: Defer to PT evaluation   Cervical / Trunk Assessment Cervical / Trunk Assessment: Normal   Communication Communication Communication: No difficulties   Cognition Arousal/Alertness: Awake/alert Behavior During Therapy: WFL for tasks assessed/performed Overall Cognitive Status: Within Functional Limits  for tasks assessed                                 General Comments: somewhat slow processing   General Comments       Exercises     Shoulder Instructions      Home Living Family/patient expects to be discharged to:: Skilled nursing facility Living  Arrangements: Alone                                      Prior Functioning/Environment Level of Independence: Independent with assistive device(s)        Comments: ambulates with her mother's old walker, independent in self care and light housekeeping and meal prep, aunt and uncle help with errands and housework        OT Problem List: Decreased strength;Impaired balance (sitting and/or standing);Decreased knowledge of use of DME or AE;Pain      OT Treatment/Interventions: Self-care/ADL training;Patient/family education;Balance training;Therapeutic activities;DME and/or AE instruction    OT Goals(Current goals can be found in the care plan section) Acute Rehab OT Goals Patient Stated Goal: get stronger in rehab and go home to her dog OT Goal Formulation: With patient Time For Goal Achievement: 02/08/20 Potential to Achieve Goals: Good ADL Goals Pt Will Perform Grooming: with supervision;sitting Pt Will Perform Upper Body Bathing: with supervision;sitting Pt Will Perform Upper Body Dressing: with supervision;sitting Pt Will Transfer to Toilet: with min assist;ambulating;bedside commode Pt Will Perform Toileting - Clothing Manipulation and hygiene: with min assist;sit to/from stand Additional ADL Goal #1: Pt will perform bed mobility with min assist in preparation for ADL.  OT Frequency: Min 2X/week   Barriers to D/C: Decreased caregiver support          Co-evaluation              AM-PAC OT "6 Clicks" Daily Activity     Outcome Measure Help from another person eating meals?: None Help from another person taking care of personal grooming?: A Little Help from another person toileting, which includes using toliet, bedpan, or urinal?: Total Help from another person bathing (including washing, rinsing, drying)?: A Lot Help from another person to put on and taking off regular upper body clothing?: A Little Help from another person to put on and taking off  regular lower body clothing?: Total 6 Click Score: 14   End of Session Equipment Utilized During Treatment: Gait belt;Rolling walker  Activity Tolerance: Patient tolerated treatment well Patient left: in chair;with call bell/phone within reach  OT Visit Diagnosis: Unsteadiness on feet (R26.81);Other abnormalities of gait and mobility (R26.89);Pain;Muscle weakness (generalized) (M62.81);History of falling (Z91.81)                Time: 4010-2725 OT Time Calculation (min): 29 min Charges:  OT General Charges $OT Visit: 1 Visit OT Evaluation $OT Eval Moderate Complexity: 1 Mod OT Treatments $Self Care/Home Management : 8-22 mins  Nestor Lewandowsky, OTR/L Acute Rehabilitation Services Pager: [phone] Office: [phone]  [name] 01/25/2020, 10:41 AM

## 2020-01-26 DIAGNOSIS — S72142A Displaced intertrochanteric fracture of left femur, initial encounter for closed fracture: Secondary | ICD-10-CM | POA: Diagnosis not present

## 2020-01-26 DIAGNOSIS — I952 Hypotension due to drugs: Secondary | ICD-10-CM

## 2020-01-26 DIAGNOSIS — E876 Hypokalemia: Secondary | ICD-10-CM | POA: Diagnosis not present

## 2020-01-26 DIAGNOSIS — I10 Essential (primary) hypertension: Secondary | ICD-10-CM | POA: Diagnosis not present

## 2020-01-26 LAB — CBC
HCT: 24.9 % — ABNORMAL LOW (ref 36.0–46.0)
Hemoglobin: 8.2 g/dL — ABNORMAL LOW (ref 12.0–15.0)
MCH: 31.1 pg (ref 26.0–34.0)
MCHC: 32.9 g/dL (ref 30.0–36.0)
MCV: 94.3 fL (ref 80.0–100.0)
Platelets: 198 10*3/uL (ref 150–400)
RBC: 2.64 MIL/uL — ABNORMAL LOW (ref 3.87–5.11)
RDW: 17 % — ABNORMAL HIGH (ref 11.5–15.5)
WBC: 5.5 10*3/uL (ref 4.0–10.5)
nRBC: 0 % (ref 0.0–0.2)

## 2020-01-26 LAB — MAGNESIUM: Magnesium: 2.2 mg/dL (ref 1.7–2.4)

## 2020-01-26 LAB — BASIC METABOLIC PANEL
Anion gap: 8 (ref 5–15)
BUN: <5 mg/dL — ABNORMAL LOW (ref 8–23)
CO2: 28 mmol/L (ref 22–32)
Calcium: 7.9 mg/dL — ABNORMAL LOW (ref 8.9–10.3)
Chloride: 105 mmol/L (ref 98–111)
Creatinine, Ser: 0.39 mg/dL — ABNORMAL LOW (ref 0.44–1.00)
GFR, Estimated: >60 mL/min (ref >60)
Glucose, Bld: 146 mg/dL — ABNORMAL HIGH (ref 70–99)
Potassium: 3 mmol/L — ABNORMAL LOW (ref 3.5–5.1)
Sodium: 141 mmol/L (ref 135–145)

## 2020-01-26 MED ORDER — POTASSIUM CHLORIDE 10 MEQ/100ML IV SOLN
10.0000 meq | INTRAVENOUS | Status: AC
  Administered 2020-01-26 (×3): 10 meq via INTRAVENOUS
  Filled 2020-01-26 (×3): qty 100

## 2020-01-26 NOTE — Progress Notes (Signed)
Transition of Care (TOC) -30 day Note          Patient Details   Name: [name]   MRN: [id number]  Date of Birth: Aug 27, 1955     Transition of Care [facility]) CM/SW Contact   Name: Kathrin Greathouse   Phone Number: [phone]  Date: 01/26/2020  Time: 11:19AM     MUST ID: [id number]    To Whom it May Concern:     Please be advised that the above patient will require a short-term nursing home stay, anticipated 30 days or less rehabilitation and strengthening. The plan is for return home.

## 2020-01-26 NOTE — Progress Notes (Signed)
Subjective: 2 Days Post-Op Procedure(s) (LRB): INTRAMEDULLARY (IM) NAIL INTERTROCHANTRIC (Left) Patient reports pain as mild.    Objective: Vital signs in last 24 hours: Temp:  [97.8 F (36.6 C)-98.9 F (37.2 C)] 98.4 F (36.9 C) (10/18 0519) Pulse Rate:  [60-80] 75 (10/18 0519) Resp:  [13-21] 17 (10/18 0519) BP: (76-142)/(44-76) 142/66 (10/18 0519) SpO2:  [88 %-100 %] 99 % (10/18 0519)  Intake/Output from previous day: 10/17 0701 - 10/18 0700 In: 2317.6 [P.O.:800; I.V.:987.1; IV Piggyback:530.5] Out: 2801 [Urine:2801] Intake/Output this shift: Total I/O In: 1017.8 [P.O.:240; I.V.:677.8; IV Piggyback:100] Out: 1301 [Urine:1301]  Recent Labs    01/23/20 1824 01/24/20 1146 01/25/20 0346 01/26/20 0336  HGB 10.5* 9.5* 8.1* 8.2*   Recent Labs    01/25/20 0346 01/26/20 0336  WBC 5.7 5.5  RBC 2.58* 2.64*  HCT 23.9* 24.9*  PLT 187 198   Recent Labs    01/25/20 0917 01/26/20 0336  NA 136 141  K 2.9* 3.0*  CL 101 105  CO2 25 28  BUN <5* <5*  CREATININE 0.42* 0.39*  GLUCOSE 180* 146*  CALCIUM 8.3* 7.9*   Recent Labs    01/23/20 1824  INR 1.4*    Neurologically intact Neurovascular intact Sensation intact distally Intact pulses distally Dorsiflexion/Plantar flexion intact Incision: dressing C/D/I No cellulitis present Compartment soft   Assessment/Plan: 2 Days Post-Op Procedure(s) (LRB): INTRAMEDULLARY (IM) NAIL INTERTROCHANTRIC (Left) Up with therapy  WBAT LLE ABLA- mild and stable Lovenox/SCDs for dvt ppx D/c disp per primary team.  Appears to need SNF F/u with Dr. Erlinda Hong two weeks post-op for staple removal      Aundra Dubin 01/26/2020, 6:44 AM

## 2020-01-26 NOTE — Progress Notes (Signed)
Physical Therapy Treatment Patient Details Name: [name] MRN: [id number] DOB: October 14, 1955 Today's Date: 01/26/2020    History of Present Illness 64 year old female with history of hypertension, schizoaffective disorder was discharged but 3 days ago after being admitted for acute pancreatitis unclear etiology came to hospital after patient tripped on her dog. xrays= L IT hip fx. s/p L IM nail per Dr. Erlinda Hong 01/24/20.    PT Comments    Pt very sleepy, arouses with multi-modal stimuli. Continues to require +2 assist for basic tasks. Will benefit from SNF post acute    Follow Up Recommendations  SNF     Equipment Recommendations  None recommended by PT    Recommendations for Other Services       Precautions / Restrictions Precautions Precautions: Fall Precaution Comments: pt with hx of multiple falls Restrictions Weight Bearing Restrictions: No LLE Weight Bearing: Weight bearing as tolerated    Mobility  Bed Mobility Overal bed mobility: Needs Assistance Bed Mobility: Supine to Sit     Supine to sit: Mod assist     General bed mobility comments: cues for technique, assist with LLE and to elevate trunk   Transfers Overall transfer level: Needs assistance Equipment used: Rolling walker (2 wheeled);None Transfers: Sit to/from [facility] Sit to Stand: Mod assist;+2 safety/equipment;+2 physical assistance Stand pivot transfers: Mod assist;+2 physical assistance;+2 safety/equipment       General transfer comment: multi-modal cues for wt shift, hand placement, sequence and safety   Ambulation/Gait Ambulation/Gait assistance: Min assist;+2 physical assistance;+2 safety/equipment;Mod assist Gait Distance (Feet): 4 Feet Assistive device: Rolling walker (2 wheeled) Gait Pattern/deviations: Step-to pattern;Decreased weight shift to left;Decreased stance time - left     General Gait Details: pivotal steps from [facility] to chair, pt requiring incr time.  difficulty advancing RLE d/t pain when WBing LLE    Stairs             Wheelchair Mobility    Modified Rankin (Stroke Patients Only)       Balance                                            Cognition Arousal/Alertness: Lethargic;Suspect due to medications Behavior During Therapy: [facility] for tasks assessed/performed                       Current Attention Level: Sustained   Following Commands: Follows one step commands with increased time;Follows multi-step commands inconsistently     Problem Solving: Slow processing;Difficulty sequencing;Requires verbal cues;Requires tactile cues        Exercises General Exercises - Lower Extremity Ankle Circles/Pumps: AROM;Both;10 reps Quad Sets: AROM;Both;5 reps    General Comments        Pertinent Vitals/Pain Pain Assessment: Faces Faces Pain Scale: Hurts even more Pain Location: L hip Pain Descriptors / Indicators: Grimacing;Sore;Other (Comment) (screaming) Pain Intervention(s): Limited activity within patient's tolerance;Monitored during session    Home Living                      Prior Function            PT Goals (current goals can now be found in the care plan section) Acute Rehab PT Goals Patient Stated Goal: get stronger in rehab and go home to her dog PT Goal Formulation: With patient Time For Goal Achievement: 02/08/20 Potential to  Achieve Goals: Good Progress towards PT goals: Progressing toward goals    Frequency    Min 3X/week      PT Plan Current plan remains appropriate    Co-evaluation              AM-PAC PT "6 Clicks" Mobility   Outcome Measure  Help needed turning from your back to your side while in a flat bed without using bedrails?: A Lot Help needed moving from lying on your back to sitting on the side of a flat bed without using bedrails?: A Lot Help needed moving to and from a bed to a chair (including a wheelchair)?: A Lot Help needed  standing up from a chair using your arms (e.g., wheelchair or bedside chair)?: A Lot Help needed to walk in hospital room?: Total Help needed climbing 3-5 steps with a railing? : Total 6 Click Score: 10    End of Session Equipment Utilized During Treatment: Gait belt Activity Tolerance: Patient tolerated treatment well Patient left: in chair;with call bell/phone within reach;with chair alarm set Nurse Communication: Mobility status PT Visit Diagnosis: Other abnormalities of gait and mobility (R26.89)     Time: 9842-1031 PT Time Calculation (min) (ACUTE ONLY): 30 min  Charges:  $Therapeutic Activity: 23-37 mins                     Baxter Flattery, PT  Acute Rehab Dept ([place]) [phone] Pager [phone]  01/26/2020    [facility] 01/26/2020, 2:52 PM

## 2020-01-26 NOTE — Progress Notes (Signed)
[facility]  PROGRESS NOTE  [name] YCX:[id number] DOB: 1955-05-11 DOA: 01/23/2020 PCP: Bartholome Bill, MD   Brief HPI:   64 year old female with history of hypertension, schizoaffective disorder was discharged but 3 days ago after being admitted for acute pancreatitis unclear etiology came to hospital after patient tripped on her dog. She did not lose consciousness. In the ED x-rays revealed left hip fracture. Orthopedic surgeon Dr. Rigoberto Noel was consulted. Patient underwent open treatment of intertrochanteric fracture with intramedullary implant.   Subjective   Patient seen and examined, feeling much better today.  She became hypotensive after she received lisinopril yesterday morning.  Rapid response was called, however patient responded to IV fluid bolus.  Lisinopril is currently on hold.  Troponin was unremarkable, lactic acid was normal.   Assessment/Plan:     1. Left hip fracture-s/p mechanical fall, s/p left hip fracture repair.  Pain controlled, DVT prophylaxis as per orthopedics. 2. Hypotension-resolved, patient became hypotensive after she  received lisinopril yesterday.  Received IV fluid bolus and normal saline at 125 mL/h which was cut down to 75 mill per hour.  At this time hypotension has resolved, lisinopril is currently on hold.  We will change IV fluids to [facility]. 3. Hypertension-blood pressure is well controlled, antihypertensive on hold as above. 4. Hypokalemia-potassium is 3.0, magnesium was 1.4.  Which was replaced yesterday.  Will give KCl 10 mg IV x3.  Check serum magnesium and serum potassium in a.m.  5. Hypomagnesemia-magnesium was 1.4 yesterday, 4 g of magnesium sulfate IV x1 was given.  Check serum magnesium level in a.m. 6. History of schizoaffective disorder-continue Geodon, Cogentin 7. Anemia-hemoglobin chronically low, today hemoglobin is 8.1, dropped from 9.5 yesterday.  Follow hemoglobin in a.m.  Transfuse for hemoglobin less than 7.   COVID-19  Labs  No results for input(s): DDIMER, FERRITIN, LDH, CRP in the last 72 hours.  Lab Results  Component Value Date   SARSCOV2NAA NEGATIVE 01/23/2020   [place] NEGATIVE 01/08/2020     Scheduled medications:   . benztropine  1 mg Oral Daily  . docusate sodium  100 mg Oral BID  . enoxaparin (LOVENOX) injection  40 mg Subcutaneous Q24H  . lipase/protease/amylase  36,000 Units Oral TID AC  . mupirocin ointment   Nasal BID  . nicotine  14 mg Transdermal Daily  . oxyCODONE  10 mg Oral Q12H  . pantoprazole  40 mg Oral BID  . povidone-iodine  2 application Topical Once  . sertraline  200 mg Oral Daily   And  . sertraline  50 mg Oral QHS  . sucralfate  1 g Oral QID  . ziprasidone  80 mg Oral BID WC         CBG: Recent Labs  Lab 01/20/20 0014 01/20/20 0527 01/20/20 0737 01/20/20 1207 01/25/20 1415  GLUCAP 139* 156* 122* 167* 154*    SpO2: 99 % O2 Flow Rate (L/min): 2 L/min    CBC: Recent Labs  Lab 01/23/20 1824 01/24/20 1146 01/25/20 0346 01/26/20 0336  WBC 9.5 6.4 5.7 5.5  NEUTROABS 8.3*  --   --   --   HGB 10.5* 9.5* 8.1* 8.2*  HCT 32.2* 28.2* 23.9* 24.9*  MCV 93.6 93.4 92.6 94.3  PLT 252 189 187 497    Basic Metabolic Panel: Recent Labs  Lab 01/20/20 0603 01/20/20 0603 01/23/20 1824 01/24/20 1146 01/25/20 0346 01/25/20 0917 01/26/20 0336  NA 144  --  138  --  143 136 141  K 3.5  --  3.3*  --  2.4* 2.9* 3.0*  CL 110  --  103  --  104 101 105  CO2 22  --  20*  --  27 25 28   GLUCOSE 114*  --  112*  --  116* 180* 146*  BUN <5*  --  7*  --  <5* <5* <5*  CREATININE 0.48   < > 0.51 0.42* 0.44 0.42* 0.39*  CALCIUM 9.2  --  9.1  --  8.4* 8.3* 7.9*  MG  --   --   --   --   --  1.4*  --    < > = values in this interval not displayed.     Liver Function Tests: Recent Labs  Lab 01/20/20 0603 01/23/20 1824  AST 24 30  ALT 19 22  ALKPHOS 60 77  BILITOT 0.8 1.5*  PROT 5.7* 6.6  ALBUMIN 2.7* 3.4*     Antibiotics: Anti-infectives (From  admission, onward)   Start     Dose/Rate Route Frequency Ordered Stop   01/24/20 1400  ceFAZolin (ANCEF) IVPB 2g/100 mL premix        2 g 200 mL/hr over 30 Minutes Intravenous Every 6 hours 01/24/20 1013 01/25/20 0210   01/24/20 0749  ceFAZolin (ANCEF) 2-4 GM/100ML-% IVPB       Note to Pharmacy: Marquis Buggy   : cabinet override      01/24/20 0749 01/24/20 1959   01/24/20 0600  ceFAZolin (ANCEF) IVPB 2g/100 mL premix        2 g 200 mL/hr over 30 Minutes Intravenous On call to O.R. 01/23/20 2024 01/24/20 0753       DVT prophylaxis: Lovenox  Code Status: Full code  Family Communication: No family at bedside    Status is: Inpatient  Dispo: The patient is from: Home              Anticipated d/c is to: Skilled nursing facility              Anticipated d/c date is: 01/27/2020              Patient currently not medically stable for discharge  Barrier to discharge-awaiting PT evaluation      Consultants:  Orthopedic surgeon  Procedures:  Left hip fracture repair   Objective   Vitals:   01/25/20 2145 01/25/20 2223 01/26/20 0206 01/26/20 0519  BP: (!) 121/58 91/76 (!) 117/51 (!) 142/66  Pulse: 63 69 80 75  Resp: 16 17 16 17   Temp: 98.2 F (36.8 C) 98.9 F (37.2 C) 98.8 F (37.1 C) 98.4 F (36.9 C)  TempSrc: Oral Oral  Oral  SpO2: 100% 100% 93% 99%  Weight:      Height:        Intake/Output Summary (Last 24 hours) at 01/26/2020 0847 Last data filed at 01/26/2020 0222 Gross per 24 hour  Intake 2187.07 ml  Output 2801 ml  Net -613.93 ml    10/16 1901 - 10/18 0700 In: 8916 [P.O.:920; I.V.:1820.5] Out: 3326 [Urine:3326]  Filed Weights   01/24/20 0612  Weight: 72.8 kg    Physical Examination:  General-appears in no acute distress Heart-S1-S2, regular, no murmur auscultated Lungs-clear to auscultation bilaterally, no wheezing or crackles auscultated Abdomen-soft, nontender, no organomegaly Extremities-no edema in the lower  extremities Neuro-alert, oriented x3, no focal deficit noted   Data Reviewed:   Recent Results (from the past 240 hour(s))  Respiratory Panel by RT PCR (Flu A&B, Covid) - Nasopharyngeal Swab  Status: None   Collection Time: 01/23/20  6:24 PM   Specimen: Nasopharyngeal Swab  Result Value Ref Range Status   SARS Coronavirus 2 by RT PCR NEGATIVE NEGATIVE Final    Comment: (NOTE) SARS-CoV-2 target nucleic acids are NOT DETECTED.  The SARS-CoV-2 RNA is generally detectable in upper respiratoy specimens during the acute phase of infection. The lowest concentration of SARS-CoV-2 viral copies this assay can detect is 131 copies/mL. A negative result does not preclude SARS-Cov-2 infection and should not be used as the sole basis for treatment or other patient management decisions. A negative result may occur with  improper specimen collection/handling, submission of specimen other than nasopharyngeal swab, presence of viral mutation(s) within the areas targeted by this assay, and inadequate number of viral copies (<131 copies/mL). A negative result must be combined with clinical observations, patient history, and epidemiological information. The expected result is Negative.  Fact Sheet for Patients:  PinkCheek.be  Fact Sheet for Healthcare Providers:  GravelBags.it  This test is no t yet approved or cleared by the [place] FDA and  has been authorized for detection and/or diagnosis of SARS-CoV-2 by FDA under an Emergency Use Authorization (EUA). This EUA will remain  in effect (meaning this test can be used) for the duration of the COVID-19 declaration under Section 564(b)(1) of the Act, 21 U.S.C. section 360bbb-3(b)(1), unless the authorization is terminated or revoked sooner.     Influenza A by PCR NEGATIVE NEGATIVE Final   Influenza B by PCR NEGATIVE NEGATIVE Final    Comment: (NOTE) The Xpert Xpress  SARS-CoV-2/FLU/RSV assay is intended as an aid in  the diagnosis of influenza from Nasopharyngeal swab specimens and  should not be used as a sole basis for treatment. Nasal washings and  aspirates are unacceptable for Xpert Xpress SARS-CoV-2/FLU/RSV  testing.  Fact Sheet for Patients: PinkCheek.be  Fact Sheet for Healthcare Providers: GravelBags.it  This test is not yet approved or cleared by the [place] FDA and  has been authorized for detection and/or diagnosis of SARS-CoV-2 by  FDA under an Emergency Use Authorization (EUA). This EUA will remain  in effect (meaning this test can be used) for the duration of the  Covid-19 declaration under Section 564(b)(1) of the Act, 21  U.S.C. section 360bbb-3(b)(1), unless the authorization is  terminated or revoked. Performed at [facility], [place] [address], [place], [place] [address]   Surgical pcr screen     Status: Abnormal   Collection Time: 01/24/20  6:13 AM   Specimen: Nasal Mucosa; Nasal Swab  Result Value Ref Range Status   MRSA, PCR NEGATIVE NEGATIVE Final   Staphylococcus aureus POSITIVE (A) NEGATIVE Final    Comment: (NOTE) The Xpert SA Assay (FDA approved for NASAL specimens in patients 6 years of age and older), is one component of a comprehensive surveillance program. It is not intended to diagnose infection nor to guide or monitor treatment. Performed at [facility], [place] [address], [place], [place] [address]     No results for input(s): LIPASE, AMYLASE in the last 168 hours. No results for input(s): AMMONIA in the last 168 hours.  Cardiac Enzymes: No results for input(s): CKTOTAL, CKMB, CKMBINDEX, TROPONINI in the last 168 hours. BNP (last 3 results) Recent Labs    01/08/20 1450  BNP 76.0    ProBNP (last 3 results) No results for input(s): PROBNP in the last 8760 hours.  Studies:  DG C-Arm 1-60 Min-No  Report  Result Date: 01/24/2020  Fluoroscopy was utilized by the requesting physician.  No radiographic interpretation.   DG HIP OPERATIVE UNILAT W OR W/O PELVIS LEFT  Result Date: 01/24/2020 CLINICAL DATA:  Left hip fracture repair FLUOROSCOPY TIME:  1 minutes 2 seconds. Images: 4 EXAM: OPERATIVE LEFT HIP (WITH PELVIS IF PERFORMED) 4 VIEWS TECHNIQUE: Fluoroscopic spot image(s) were submitted for interpretation post-operatively. COMPARISON:  None. FINDINGS: Two gamma nails have been placed across the intertrochanteric left hip fracture. An intramedullary femoral rod has been placed as well, affixed distally with an interlocking screw. IMPRESSION: Left hip fracture repair as above. Hardware in good position as above. Electronically Signed   By: Dorise Bullion III M.D   On: 01/24/2020 10:55       Oswald Hillock   [facility] If 7PM-7AM, please contact night-coverage at [email or web address], Office  [phone]   01/26/2020, 8:47 AM  LOS: 3 days

## 2020-01-26 NOTE — NC FL2 (Signed)
[place] LEVEL OF CARE SCREENING TOOL     IDENTIFICATION  Patient Name: [name] Birthdate: 1956-02-04 Sex: female Admission Date (Current Location): 01/23/2020  [facility] and [place] Number:  Herbalist and Address:  [facility],  [place], [place]      Provider Number: [id number]  Attending Physician Name and Address:  Oswald Hillock, MD  Relative Name and Phone Number:  Perrin Maltese [phone]  [phone]    Current Level of Care: Hospital Recommended Level of Care: [place] Prior Approval Number:    Date Approved/Denied:   PASRR Number:    Discharge Plan: SNF    Current Diagnoses: Patient Active Problem List   Diagnosis Date Noted  . Closed intertrochanteric fracture of left femur, initial encounter ([place]) 01/24/2020  . HTN (hypertension) 01/12/2020  . Hypophosphatemia 01/12/2020  . Hypokalemia 01/12/2020  . Acute pancreatitis 01/08/2020  . Depression 01/08/2020  . Diverticulosis 01/08/2020  . Tubular adenoma of colon 01/08/2020  . Sepsis, unspecified organism ([place]) 01/08/2020  . Prolapsed internal hemorrhoids, grade 3 12/01/2015  . Schizoaffective disorder, depressive type ([place]) 11/04/2013  . PTSD (post-traumatic stress disorder) 11/04/2013  . Cigarette nicotine dependence without complication 88/82/8003    Orientation RESPIRATION BLADDER Height & Weight     Self, Time, Situation, Place  Normal Continent Weight: 160 lb 7.9 oz (72.8 kg) Height:  5\' 2"  (157.5 cm)  BEHAVIORAL SYMPTOMS/MOOD NEUROLOGICAL BOWEL NUTRITION STATUS      Continent Diet (Regular)  AMBULATORY STATUS COMMUNICATION OF NEEDS Skin   Extensive Assist Verbally Surgical wounds (Incision Hip)                       Personal Care Assistance Level of Assistance  Bathing, Feeding, Dressing Bathing Assistance: Maximum assistance Feeding assistance: Independent Dressing Assistance: Maximum assistance      Functional Limitations Info  Sight, Speech, Hearing Sight Info: Adequate Hearing Info: Adequate Speech Info: Adequate    SPECIAL CARE FACTORS FREQUENCY  PT (By licensed PT), OT (By licensed OT)     PT Frequency: 5x/week OT Frequency: 5x/week            Contractures Contractures Info: Not present    Additional Factors Info  Code Status, Allergies, Psychotropic Code Status Info: Fullcode Allergies Info: Allergies: Penicillins, Sulfa Antibiotics Psychotropic Info: Zoloft 200mg , 50mg , Geodon 80 mg         Current Medications (01/26/2020):  This is the current hospital active medication list Current Facility-Administered Medications  Medication Dose Route Frequency Provider Last Rate Last Admin  . 0.9 %  sodium chloride infusion   Intravenous Continuous Oswald Hillock, MD 10 mL/hr at 01/26/20 0820 Rate Change at 01/26/20 0820  . acetaminophen (TYLENOL) tablet 325-650 mg  325-650 mg Oral Q6H PRN Leandrew Koyanagi, MD      . alum & mag hydroxide-simeth (MAALOX/MYLANTA) 200-200-20 MG/5ML suspension 30 mL  30 mL Oral Q4H PRN Leandrew Koyanagi, MD   30 mL at 01/25/20 0137  . benztropine (COGENTIN) tablet 1 mg  1 mg Oral Daily Leandrew Koyanagi, MD   1 mg at 01/26/20 0942  . docusate sodium (COLACE) capsule 100 mg  100 mg Oral BID Leandrew Koyanagi, MD   100 mg at 01/26/20 0942  . enoxaparin (LOVENOX) injection 40 mg  40 mg Subcutaneous Q24H Leandrew Koyanagi, MD   40 mg at 01/26/20 0544  . hydrALAZINE (APRESOLINE) injection 10 mg  10  mg Intravenous Q4H PRN Leandrew Koyanagi, MD      . HYDROmorphone (DILAUDID) injection 0.5-1 mg  0.5-1 mg Intravenous Q4H PRN Leandrew Koyanagi, MD   1 mg at 01/25/20 0703  . lipase/protease/amylase (CREON) capsule 36,000 Units  36,000 Units Oral TID Charna Archer Leandrew Koyanagi, MD   36,000 Units at 01/26/20 [phone]  . magnesium citrate solution 1 Bottle  1 Bottle Oral Once PRN Leandrew Koyanagi, MD      . menthol-cetylpyridinium (CEPACOL) lozenge 3 mg  1 lozenge Oral PRN Leandrew Koyanagi, MD        Or  . phenol (CHLORASEPTIC) mouth spray 1 spray  1 spray Mouth/Throat PRN Leandrew Koyanagi, MD      . methocarbamol (ROBAXIN) tablet 500 mg  500 mg Oral Q6H PRN Leandrew Koyanagi, MD   500 mg at 01/25/20 9528   Or  . methocarbamol (ROBAXIN) 500 mg in dextrose 5 % 50 mL IVPB  500 mg Intravenous Q6H PRN Leandrew Koyanagi, MD   Stopped at 01/24/20 1147  . mupirocin ointment (BACTROBAN) 2 %   Nasal BID Leandrew Koyanagi, MD   Given at 01/26/20 [phone]  . nicotine (NICODERM CQ - dosed in mg/24 hours) patch 14 mg  14 mg Transdermal Daily Blount, Scarlette Shorts T, NP   14 mg at 01/25/20 0902  . ondansetron (ZOFRAN) tablet 4 mg  4 mg Oral Q6H PRN Leandrew Koyanagi, MD       Or  . ondansetron [facility]) injection 4 mg  4 mg Intravenous Q6H PRN Leandrew Koyanagi, MD      . oxyCODONE (Oxy IR/ROXICODONE) immediate release tablet 10-15 mg  10-15 mg Oral Q4H PRN Leandrew Koyanagi, MD   15 mg at 01/25/20 4401  . oxyCODONE (Oxy IR/ROXICODONE) immediate release tablet 5-10 mg  5-10 mg Oral Q4H PRN Leandrew Koyanagi, MD   10 mg at 01/26/20 0246  . oxyCODONE (OXYCONTIN) 12 hr tablet 10 mg  10 mg Oral Q12H Leandrew Koyanagi, MD   10 mg at 01/26/20 0942  . pantoprazole (PROTONIX) EC tablet 40 mg  40 mg Oral BID Leandrew Koyanagi, MD   40 mg at 01/26/20 0942  . polyethylene glycol (MIRALAX / GLYCOLAX) packet 17 g  17 g Oral Daily PRN Leandrew Koyanagi, MD      . potassium chloride 10 mEq in 100 mL IVPB  10 mEq Intravenous Q1 Hr x 3 Darrick Meigs, Gagan S, MD 50 mL/hr at 01/26/20 0900 10 mEq at 01/26/20 0900  . povidone-iodine 10 % swab 2 application  2 application Topical Once Leandrew Koyanagi, MD      . sertraline (ZOLOFT) tablet 200 mg  200 mg Oral Daily Leandrew Koyanagi, MD   200 mg at 01/26/20 0941   And  . sertraline (ZOLOFT) tablet 50 mg  50 mg Oral QHS Leandrew Koyanagi, MD   50 mg at 01/25/20 2150  . sorbitol 70 % solution 30 mL  30 mL Oral Daily PRN Leandrew Koyanagi, MD      . sucralfate (CARAFATE) 1 GM/10ML suspension 1 g  1 g Oral QID Leandrew Koyanagi, MD   1 g at 01/25/20 2150  .  traZODone (DESYREL) tablet 50 mg  50 mg Oral QHS PRN Leandrew Koyanagi, MD   50 mg at 01/25/20 0138  . ziprasidone (GEODON) capsule 80 mg  80 mg Oral BID WC Leandrew Koyanagi, MD   80 mg at  01/26/20 0817     Discharge Medications: Please see discharge summary for a list of discharge medications.  Relevant Imaging Results:  Relevant Lab Results:   Additional Information ssn: [id number]  Lia Hopping, LCSW

## 2020-01-26 NOTE — Care Management Important Message (Signed)
Important Message  Patient Details IM Letter given to the Patient Name: [name] MRN: [id number] Date of Birth: October 20, 1955   Medicare Important Message Given:  Yes     Reign, [place] 01/26/2020, 11:31 AM

## 2020-01-26 NOTE — TOC Initial Note (Signed)
Transition of Care [facility]) - Initial/Assessment Note    Patient Details  Name: [name] MRN: [id number] Date of Birth: February 29, 1956  [facility] CM/SW Contact:    Lia Hopping, [place] Phone Number: 01/26/2020, 12:34 PM  Clinical Narrative:      Patient admitted after fall at home, she tripped over her dog. Patient  underwentt left hip fracture repair. Patient lives alone and does not have anyone that can provide assistance or support. CSW met with the patient at bedside to discuss SNF placement. Patient is agreeable to SNF for short rehab.CSW explain SNF rehab process and will follow up with bed offers.  FL2 completed/Level II PASRR. [place] reference# [id number]/Clinicals faxed for review  Expected Discharge Plan: Skilled Nursing Facility Barriers to Discharge: Continued Medical Work up   Patient Goals and CMS Choice Patient states their goals for this hospitalization and ongoing recovery are:: go to rehab CMS Medicare.gov Compare Post Acute Care list provided to:: Patient Choice offered to / list presented to : Patient  Expected Discharge Plan and Services Expected Discharge Plan: [place] In-house Referral: Clinical Social Work Discharge Planning Services: CM Consult Post Acute Care Choice: [place] arrangements for the past 2 months: [place]                                      Prior Living Arrangements/Services Living arrangements for the past 2 months: Single Family Home Lives with:: Self Patient language and need for interpreter reviewed:: No Do you feel safe going back to the place where you live?: Yes      Need for Family Participation in Patient Care: Yes (Comment) Care giver support system in place?: Yes (comment)   Criminal Activity/Legal Involvement Pertinent to Current Situation/Hospitalization: No - Comment as needed  Activities of Daily Living Home Assistive  Devices/Equipment: Wheelchair, Environmental consultant (specify type), Shower chair with back, Eyeglasses ADL Screening (condition at time of admission) Patient's cognitive ability adequate to safely complete daily activities?: Yes Is the patient deaf or have difficulty hearing?: No Does the patient have difficulty seeing, even when wearing glasses/contacts?: Yes Does the patient have difficulty concentrating, remembering, or making decisions?: Yes Patient able to express need for assistance with ADLs?: Yes Does the patient have difficulty dressing or bathing?: No Independently performs ADLs?: Yes (appropriate for developmental age) Communication: Independent Dressing (OT): Needs assistance Is this a change from baseline?: Change from baseline, expected to last >3 days Grooming: Independent Is this a change from baseline?: Pre-admission baseline Feeding: Independent Is this a change from baseline?: Pre-admission baseline Bathing: Needs assistance Is this a change from baseline?: Change from baseline, expected to last >3 days Toileting: Needs assistance Is this a change from baseline?: Change from baseline, expected to last >3days In/Out Bed: Needs assistance Is this a change from baseline?: Change from baseline, expected to last >3 days Walks in Home: Needs assistance Is this a change from baseline?: Change from baseline, expected to last >3 days Does the patient have difficulty walking or climbing stairs?: Yes Weakness of Legs: Left Weakness of Arms/Hands: None  Permission Sought/Granted Permission sought to share information with : Facility Art therapist granted to share information with : Yes, Verbal Permission Granted     Permission granted to share info w AGENCY: [place] in the area        Emotional Assessment Appearance:: Appears  stated age Attitude/Demeanor/Rapport: Engaged Affect (typically observed): Appropriate Orientation: : Oriented to Self,  Oriented to Place, Oriented to  Time, Oriented to Situation Alcohol / Substance Use: Not Applicable Psych Involvement: No (comment)  Admission diagnosis:  Hip fracture ([place]) [S72.009A] Pain [R52] Closed fracture of left hip, initial encounter ([place]) [S72.002A] Closed left hip fracture, initial encounter ([place]) [S72.002A] Patient Active Problem List   Diagnosis Date Noted  . Closed intertrochanteric fracture of left femur, initial encounter ([place]) 01/24/2020  . HTN (hypertension) 01/12/2020  . Hypophosphatemia 01/12/2020  . Hypokalemia 01/12/2020  . Acute pancreatitis 01/08/2020  . Depression 01/08/2020  . Diverticulosis 01/08/2020  . Tubular adenoma of colon 01/08/2020  . Sepsis, unspecified organism ([place]) 01/08/2020  . Prolapsed internal hemorrhoids, grade 3 12/01/2015  . Schizoaffective disorder, depressive type ([place]) 11/04/2013  . PTSD (post-traumatic stress disorder) 11/04/2013  . Cigarette nicotine dependence without complication 10/19/1973   PCP:  Bartholome Bill, MD Pharmacy:   [facility] [place], [place] [address] [place] [address] Phone: [phone] Fax: [phone]     Social Determinants of Health ([place]) Interventions    Readmission Risk Interventions No flowsheet data found.

## 2020-01-26 NOTE — Plan of Care (Signed)
  Problem: Education: Goal: Knowledge of General Education information will improve Description Including pain rating scale, medication(s)/side effects and non-pharmacologic comfort measures Outcome: Progressing   Problem: Nutrition: Goal: Adequate nutrition will be maintained Outcome: Progressing   Problem: Pain Managment: Goal: General experience of comfort will improve Outcome: Progressing   

## 2020-01-27 ENCOUNTER — Encounter (HOSPITAL_COMMUNITY): Payer: Self-pay | Admitting: Orthopaedic Surgery

## 2020-01-27 DIAGNOSIS — F251 Schizoaffective disorder, depressive type: Secondary | ICD-10-CM | POA: Diagnosis not present

## 2020-01-27 DIAGNOSIS — I10 Essential (primary) hypertension: Secondary | ICD-10-CM | POA: Diagnosis not present

## 2020-01-27 DIAGNOSIS — S72142A Displaced intertrochanteric fracture of left femur, initial encounter for closed fracture: Secondary | ICD-10-CM | POA: Diagnosis not present

## 2020-01-27 LAB — CBC
HCT: 30.5 % — ABNORMAL LOW (ref 36.0–46.0)
Hemoglobin: 9.4 g/dL — ABNORMAL LOW (ref 12.0–15.0)
MCH: 30.5 pg (ref 26.0–34.0)
MCHC: 30.8 g/dL (ref 30.0–36.0)
MCV: 99 fL (ref 80.0–100.0)
Platelets: 247 10*3/uL (ref 150–400)
RBC: 3.08 MIL/uL — ABNORMAL LOW (ref 3.87–5.11)
RDW: 17.4 % — ABNORMAL HIGH (ref 11.5–15.5)
WBC: 6.1 10*3/uL (ref 4.0–10.5)
nRBC: 0 % (ref 0.0–0.2)

## 2020-01-27 LAB — BASIC METABOLIC PANEL
Anion gap: 8 (ref 5–15)
BUN: <5 mg/dL — ABNORMAL LOW (ref 8–23)
CO2: 26 mmol/L (ref 22–32)
Calcium: 8.7 mg/dL — ABNORMAL LOW (ref 8.9–10.3)
Chloride: 107 mmol/L (ref 98–111)
Creatinine, Ser: 0.43 mg/dL — ABNORMAL LOW (ref 0.44–1.00)
GFR, Estimated: >60 mL/min (ref >60)
Glucose, Bld: 109 mg/dL — ABNORMAL HIGH (ref 70–99)
Potassium: 4.5 mmol/L (ref 3.5–5.1)
Sodium: 141 mmol/L (ref 135–145)

## 2020-01-27 LAB — MAGNESIUM: Magnesium: 2.1 mg/dL (ref 1.7–2.4)

## 2020-01-27 LAB — RESPIRATORY PANEL BY RT PCR (FLU A&B, COVID)
Influenza A by PCR: NEGATIVE
Influenza B by PCR: NEGATIVE
SARS Coronavirus 2 by RT PCR: NEGATIVE

## 2020-01-27 MED ORDER — DOCUSATE SODIUM 100 MG PO CAPS
100.0000 mg | ORAL_CAPSULE | Freq: Two times a day (BID) | ORAL | 0 refills | Status: DC
Start: 2020-01-27 — End: 2021-08-29

## 2020-01-27 MED ORDER — METHOCARBAMOL 500 MG PO TABS: 500.0000 mg | ORAL_TABLET | Freq: Four times a day (QID) | ORAL | Status: DC | PRN

## 2020-01-27 NOTE — Plan of Care (Signed)
  Problem: Education: Goal: Knowledge of General Education information will improve Description: Including pain rating scale, medication(s)/side effects and non-pharmacologic comfort measures Outcome: Adequate for Discharge   Problem: Health Behavior/Discharge Planning: Goal: Ability to manage health-related needs will improve Outcome: Adequate for Discharge   Problem: Clinical Measurements: Goal: Ability to maintain clinical measurements within normal limits will improve Outcome: Adequate for Discharge Goal: Will remain free from infection Outcome: Adequate for Discharge Goal: Diagnostic test results will improve Outcome: Adequate for Discharge Goal: Respiratory complications will improve Outcome: Adequate for Discharge Goal: Cardiovascular complication will be avoided Outcome: Adequate for Discharge   Problem: Activity: Goal: Risk for activity intolerance will decrease Outcome: Adequate for Discharge   Problem: Nutrition: Goal: Adequate nutrition will be maintained Outcome: Adequate for Discharge   Problem: Coping: Goal: Level of anxiety will decrease Outcome: Adequate for Discharge   Problem: Elimination: Goal: Will not experience complications related to bowel motility Outcome: Adequate for Discharge   Problem: Pain Managment: Goal: General experience of comfort will improve Outcome: Adequate for Discharge   Problem: Safety: Goal: Ability to remain free from injury will improve Outcome: Adequate for Discharge   Problem: Skin Integrity: Goal: Risk for impaired skin integrity will decrease Outcome: Adequate for Discharge   Problem: Education: Goal: Verbalization of understanding the information provided (i.e., activity precautions, restrictions, etc) will improve Outcome: Adequate for Discharge   Problem: Activity: Goal: Ability to ambulate and perform ADLs will improve Outcome: Adequate for Discharge   Problem: Clinical Measurements: Goal: Postoperative  complications will be avoided or minimized Outcome: Adequate for Discharge   Problem: Self-Concept: Goal: Ability to maintain and perform role responsibilities to the fullest extent possible will improve Outcome: Adequate for Discharge   Problem: Pain Management: Goal: Pain level will decrease Outcome: Adequate for Discharge   Problem: Increased Nutrient Needs (NI-5.1) Goal: Food and/or nutrient delivery Description: Individualized approach for food/nutrient provision. Outcome: Adequate for Discharge   Problem: Acute Rehab OT Goals (only OT should resolve) Goal: Pt. Will Perform Grooming Outcome: Adequate for Discharge Goal: Pt. Will Perform Upper Body Bathing Outcome: Adequate for Discharge Goal: Pt. Will Perform Upper Body Dressing Outcome: Adequate for Discharge Goal: Pt. Will Transfer To Toilet Outcome: Adequate for Discharge Goal: Pt. Will Perform Toileting-Clothing Manipulation Outcome: Adequate for Discharge Goal: OT Additional ADL Goal #1 Outcome: Adequate for Discharge   Problem: Acute Rehab PT Goals(only PT should resolve) Goal: Pt Will Go Supine/Side To Sit Outcome: Adequate for Discharge Goal: Patient Will Transfer Sit To/From Stand Outcome: Adequate for Discharge Goal: Pt Will Ambulate Outcome: Adequate for Discharge

## 2020-01-27 NOTE — Discharge Summary (Signed)
Physician Discharge Summary  [name] IRS:[id number] DOB: [birth year] DOA: 01/23/2020  PCP: Joyce Bill, MD  Admit date: 01/23/2020 Discharge date: 01/27/2020  Time spent: 50  minutes  Recommendations for Outpatient Follow-up:  1. Orthopedics Dr. Erlinda Keith in 2 weeks for suture removal and wound recheck 2.   Discharge Diagnoses:  Principal Problem:   Closed intertrochanteric fracture of left femur, initial encounter ([place]) Active Problems:   Schizoaffective disorder, depressive type ([place])   HTN (hypertension)   Discharge Condition: Stable  Diet recommendation: Heart healthy diet  Filed Weights   01/24/20 0612  Weight: 72.8 kg    History of present illness:  63 year old female with history of hypertension, schizoaffective disorder was discharged but 3 days ago after being admitted for acute pancreatitis unclear etiology came to hospital after patient tripped on her dog. She did not lose consciousness. In the ED x-rays revealed left hip fracture. Orthopedic surgeon Dr. Rigoberto Keith was consulted. Patient underwent open treatment of intertrochanteric fracture with intramedullary implant.  Hospital Course:  1. Left hip fracture-s/p mechanical fall, s/p left hip fracture repair.  Pain controlled, DVT prophylaxis as per orthopedics.  2. Hypotension-resolved, patient became hypotensive after she  received lisinopril 20 mg yesterday.  Received IV fluid bolus and normal saline at 125 mL/h which was cut down to 75 mill per hour.  At this time hypotension has resolved, lisinopril is currently on hold.   3. Hypertension-blood pressure is well controlled, antihypertensive on hold as above due to episode of hypotension in the hospital.  If blood pressure starts to rise, she can be restarted at lower dose of lisinopril.  4. Hypokalemia-replete  5. Hypomagnesemia-replete  6. History of schizoaffective disorder-continue Geodon, Cogentin  7. Anemia-hemoglobin chronically low, today  hemoglobin  is stable at 9.4, at baseline.   Procedures:  Left hip fracture repair  Consultations:  Orthopedics  Discharge Exam: Vitals:   01/27/20 0555 01/27/20 1038  BP: (!) 121/58 140/60  Pulse: 66 67  Resp: 17 20  Temp: 98.1 F (36.7 C) 98.5 F (36.9 C)  SpO2:  97%    General: Appears in no acute distress Cardiovascular: S1-S2, regular, no murmur auscultated Respiratory: Clear to auscultation bilaterally  Discharge Instructions   Discharge Instructions    Diet - low sodium heart healthy   Complete by: As directed    Increase activity slowly   Complete by: As directed    No wound care   Complete by: As directed    Weight bearing as tolerated   Complete by: As directed      Allergies as of 01/27/2020      Reactions   Penicillins    Did it involve swelling of the face/tongue/throat, SOB, or low BP? N Did it involve sudden or severe rash/hives, skin peeling, or any reaction on the inside of your mouth or nose? Y Did you need to seek medical attention at a hospital or doctor's office? N When did it last happen?Several Years Ago If all above answers are "NO", may proceed with cephalosporin use.   Sulfa Antibiotics Rash      Medication List    STOP taking these medications   acetaminophen 500 MG tablet Commonly known as: TYLENOL   cyclobenzaprine 5 MG tablet Commonly known as: FLEXERIL   lisinopril 20 MG tablet Commonly known as: ZESTRIL   meloxicam 15 MG tablet Commonly known as: MOBIC   pantoprazole 40 MG tablet Commonly known as: PROTONIX   traMADol 50 MG tablet Commonly known as: Veatrice Bourbon  TAKE these medications   benztropine 1 MG tablet Commonly known as: COGENTIN Take 1 tablet (1 mg total) by mouth daily.   clobetasol 0.05 % external solution Commonly known as: TEMOVATE Apply 1 application topically 2 (two) times daily.   docusate sodium 100 MG capsule Commonly known as: COLACE Take 1 capsule (100 mg total) by mouth 2  (two) times daily.   enoxaparin 40 MG/0.4ML injection Commonly known as: LOVENOX Inject 0.4 mLs (40 mg total) into the skin daily for 14 days.   fluocinonide cream 0.05 % Commonly known as: LIDEX Apply 1 application topically 3 (three) times daily.   lipase/protease/amylase 36000 UNITS Cpep capsule Commonly known as: CREON Take 1 capsule (36,000 Units total) by mouth 3 (three) times daily before meals.   methocarbamol 500 MG tablet Commonly known as: ROBAXIN Take 1 tablet (500 mg total) by mouth every 6 (six) hours as needed for muscle spasms.   omeprazole 40 MG capsule Commonly known as: PRILOSEC Take 40 mg by mouth in the morning and at bedtime.   ondansetron 4 MG tablet Commonly known as: ZOFRAN Take 4 mg by mouth every 8 (eight) hours as needed for nausea or vomiting.   oxyCODONE-acetaminophen 5-325 MG tablet Commonly known as: Percocet Take 1-2 tablets by mouth every 8 (eight) hours as needed for severe pain.   sertraline 100 MG tablet Commonly known as: ZOLOFT Take 1 tablet (100 mg total) by mouth daily. What changed:   how much to take  when to take this  additional instructions   sucralfate 1 GM/10ML suspension Commonly known as: CARAFATE Take 10 mLs (1 g total) by mouth 4 (four) times daily -  with meals and at bedtime. What changed: when to take this   traZODone 50 MG tablet Commonly known as: DESYREL Take 0.5 tablets (25 mg total) by mouth at bedtime as needed for sleep. What changed: how much to take   Vitamin D (Ergocalciferol) 1.25 MG (50000 UNIT) Caps capsule Commonly known as: DRISDOL Take 50,000 Units by mouth once a week.   ziprasidone 80 MG capsule Commonly known as: GEODON Take 1 capsule (80 mg total) by mouth at bedtime. What changed: when to take this            Discharge Care Instructions  (From admission, onward)         Start     Ordered   01/24/20 0000  Weight bearing as tolerated        01/24/20 0858          Allergies  Allergen Reactions  . Penicillins     Did it involve swelling of the face/tongue/throat, SOB, or low BP? N Did it involve sudden or severe rash/hives, skin peeling, or any reaction on the inside of your mouth or nose? Y Did you need to seek medical attention at a hospital or doctor's office? N When did it last happen?Several Years Ago If all above answers are "NO", may proceed with cephalosporin use.     . Sulfa Antibiotics Rash    Contact information for follow-up providers    Leandrew Koyanagi, MD In 2 weeks.   Specialty: Orthopedic Surgery Why: For suture removal, For wound re-check Contact information: [place] [address] [phone]            Contact information for after-discharge care    Destination    [facility] Preferred SNF .   Service: Skilled Nursing Contact information: [place] [phone]                   The results of significant diagnostics from this hospitalization (including imaging, microbiology, ancillary and laboratory) are listed below for reference.    Significant Diagnostic Studies: DG Chest 1 View  Result Date: 01/23/2020 CLINICAL DATA:  Recent trip and fall with known left femoral fracture EXAM: CHEST  1 VIEW COMPARISON:  01/08/2020 FINDINGS: The heart size and mediastinal contours are within normal limits. Both lungs are clear. The visualized skeletal structures are unremarkable. IMPRESSION: No active disease. Electronically Signed   By: Inez Catalina M.D.   On: 01/23/2020 18:56   CT Head Wo Contrast  Result Date: 01/08/2020 CLINICAL DATA:  Mental status change extreme lethargy and weakness EXAM: CT HEAD WITHOUT CONTRAST TECHNIQUE: Contiguous axial images were obtained from the base of the skull through the vertex without intravenous contrast. COMPARISON:  None. FINDINGS: Brain: No acute territorial infarction, hemorrhage or  intracranial mass. Minimal white matter hypodensity on the right. Nonenlarged ventricles. Vascular: No hyperdense vessels.  No unexpected calcification. Skull: Normal. Negative for fracture or focal lesion. Sinuses/Orbits: No acute finding. Other: None IMPRESSION: 1. No CT evidence for acute intracranial abnormality. 2. Minimal white matter hypodensity on the right, may reflect small vessel ischemic change. Electronically Signed   By: Donavan Foil M.D.   On: 01/08/2020 16:35   CT ABDOMEN PELVIS W CONTRAST  Result Date: 01/12/2020 CLINICAL DATA:  Persistent pancreatitis question pancreatic necrosis EXAM: CT ABDOMEN AND PELVIS WITH CONTRAST TECHNIQUE: Multidetector CT imaging of the abdomen and pelvis was performed using the standard protocol following bolus administration of intravenous contrast. Sagittal and coronal MPR images reconstructed from axial data set. CONTRAST:  142mL OMNIPAQUE IOHEXOL 300 MG/ML SOLN IV. No oral contrast. COMPARISON:  01/08/2020 FINDINGS: Lower chest: Linear subsegmental atelectasis LEFT lower lobe. Tiny LEFT pleural effusion. Hepatobiliary: Gallbladder surgically absent. Liver normal appearance. No biliary dilatation. Pancreas: Improved changes of acute pancreatitis with significantly decreased peripancreatic edema. Slight fatty replacement at pancreatic body/head unchanged. No focal pancreatic mass or necrosis. No abnormal pancreatic fluid collections, calcifications or ductal dilatation. Spleen: Normal appearance. 2.7 cm diameter splenule medial to spleen. Adrenals/Urinary Tract: Adrenal glands and RIGHT kidney normal appearance. Large cyst inferior pole LEFT kidney measuring 7.0 x 6.1 x 6.1 cm. No urinary tract calcification or dilatation. Bladder and ureters unremarkable. Stomach/Bowel: Normal appendix. Minimal sigmoid diverticulosis without evidence of diverticulitis. Stomach and bowel loops otherwise normal appearance. Vascular/Lymphatic: Atherosclerotic calcifications aorta  iliac arteries without aneurysm. Aorta normal caliber. No adenopathy. Reproductive: Unremarkable uterus and adnexa Other: No free air or free fluid. No hernia or in pneumothorax process. Musculoskeletal: Bones demineralized. Probable vertebral hemangioma L1. IMPRESSION: Improved changes of acute pancreatitis with significantly decreased peripancreatic edema. Large LEFT renal cyst 7.0 x 6.1 x 6.1 cm. Minimal sigmoid diverticulosis without evidence of diverticulitis. Aortic Atherosclerosis (ICD10-I70.0). Electronically Signed   By: Lavonia Dana M.D.   On: 01/12/2020 14:54   DG Chest Port 1 View  Result Date: 01/08/2020 CLINICAL DATA:  uncle called EMS out today complaining of extreme lethargy and weakness for 6 days. EXAM: PORTABLE CHEST 1 VIEW.  AP portable semi erect.  Patient is rotated COMPARISON:  None. FINDINGS: The heart size and mediastinal contours are within normal limits. No focal consolidation. No pulmonary edema. No pleural effusion. No pneumothorax. No acute osseous abnormality. IMPRESSION: No active disease. Electronically Signed   By: Iven Finn M.D.   On: 01/08/2020 15:53  DG C-Arm 1-60 Min-No Report  Result Date: 01/24/2020 Fluoroscopy was utilized by the requesting physician.  No radiographic interpretation.   CT Angio Chest/Abd/Pel for Dissection W and/or W/WO  Result Date: 01/08/2020 CLINICAL DATA:  Chest and abdominal pain. Evaluate for thoracic/abdominal dissection. EXAM: CT ANGIOGRAPHY CHEST, ABDOMEN AND PELVIS TECHNIQUE: Non-contrast CT of the chest was initially obtained. Multidetector CT imaging through the chest, abdomen and pelvis was performed using the standard protocol during bolus administration of intravenous contrast. Multiplanar reconstructed images and MIPs were obtained and reviewed to evaluate the vascular anatomy. CONTRAST:  160mL OMNIPAQUE IOHEXOL 350 MG/ML SOLN COMPARISON:  CT abdomen and pelvis - 05/02/2019 FINDINGS: CTA CHEST FINDINGS Vascular Findings:  Evaluation of the aortic root and proximal aspect of the ascending thoracic aorta is degraded secondary to pulsation artifact. Given this limitation, there is no evidence of thoracic aortic aneurysm or dissection with measurements as follows. Review of the precontrast images is negative for the presence of an intramural hematoma. Atherosclerotic plaque involving the aortic arch and distal aspect of the descending thoracic aorta, not resulting in hemodynamically significant stenosis. Bovine configuration of the aortic arch. The branch vessels of the aortic arch appear widely patent throughout their imaged courses. Normal heart size.  No pericardial effusion. Although this examination was not tailored for the evaluation the pulmonary arteries, there are no discrete filling defects within the central pulmonary arterial tree to suggest central pulmonary embolism. Normal caliber of the main pulmonary artery. ------------------------------------------------------------- Thoracic aortic measurements: Sinotubular junction 26 mm as measured in greatest oblique short axis coronal dimension. Proximal ascending aorta 38 mm in greatest oblique short axis axial diameter (axial image 29, series 6); 36 mm in greatest oblique short axis coronal diameter (coronal image 92, series 4). Aortic arch aorta 32 mm as measured in greatest oblique short axis sagittal dimension. Proximal descending thoracic aorta 28 mm as measured in greatest oblique short axis axial dimension at the level of the main pulmonary artery. Distal descending thoracic aorta 23 mm as measured in greatest oblique short axis axial dimension at the level of the diaphragmatic hiatus. Review of the MIP images confirms the above findings. ------------------------------------------------------------- Non-Vascular Findings: Mediastinum/Lymph Nodes: No bulky mediastinal, hilar or axillary lymphadenopathy. Lungs/Pleura: Minimal subsegmental atelectasis within the left lower  lobe. No discrete focal airspace opacities. No pleural effusion or pneumothorax. The central pulmonary airways appear widely patent. No discrete pulmonary nodules. Musculoskeletal: No acute or aggressive osseous abnormalities. Regional soft tissues appear normal. The thyroid appears somewhat atrophic but without discrete nodule. _________________________________________________________ _________________________________________________________ CTA ABDOMEN AND PELVIS FINDINGS VASCULAR Aorta: Moderate amount of eccentric predominantly calcified atherosclerotic plaque within a normal caliber abdominal aorta, not resulting in a hemodynamically significant stenosis. No evidence of abdominal aortic dissection or periaortic stranding. Celiac: Widely patent without hemodynamically significant narrowing. Conventional branching pattern. 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 SMA: Widely patent without a hemodynamically significant narrowing. Conventional branching pattern. The distal tributaries of the SMA are widely patent without discrete lumen filling defect to suggest distal embolism. Renals: Note is made of a tiny accessory left renal artery which supplies the inferior pole the left kidney. Solitary right renal artery. There is a moderate amount of eccentric predominantly calcified atherosclerotic plaque involving the origin and proximal aspects of  the bilateral renal arteries, not resulting in a hemodynamically significant stenosis. No vessel irregularity to suggest FMD. IMA: Remains patent. Inflow: Minimal amount of eccentric mixed calcified and noncalcified atherosclerotic plaque involves the bilateral normal caliber common iliac arteries, not resulting in  hemodynamically significant stenosis. The bilateral internal iliac arteries are mildly disease though patent and of normal caliber. The bilateral external iliac arteries are tortuous but widely patent without hemodynamically significant narrowing. Veins: The IVC and pelvic venous systems appear patent on this arterial phase examination. Review of the MIP images confirms the above findings. _________________________________________________________ Evaluation of abdominal organs is limited to the arterial phase of enhancement. NON-VASCULAR Hepatobiliary: Normal hepatic contour. There is diffuse decreased attenuation hepatic parenchyma suggestive of hepatic steatosis. No discrete hyperenhancing hepatic lesions. Post cholecystectomy. No intra or extrahepatic biliary ductal dilatation. No ascites. Pancreas: There is rather extensive inflammatory change about the pancreatic bed without definable/drainable fluid collection/pseudocyst. There is apparent homogeneous enhancement of the pancreatic parenchyma without definitive evidence of pancreatic necrosis. Suspected minimal amount of vasospasm involving the distal aspect of the splenic artery however both the splenic artery and vein remain patent. Spleen: Normal appearance of the spleen. Note is made of a splenule about the anterior inferior aspect of the spleen. Adrenals/Urinary Tract: Homogeneous enhancement of the bilateral kidneys. Note is made of an approximately 6.8 cm hypoattenuating partially exophytic cyst arising from the inferior pole of the left kidney. No discrete right-sided renal lesions. No definite evidence of nephrolithiasis on this postcontrast examination. No urinary obstruction. Normal appearance of the bilateral adrenal glands. Normal appearance of the urinary bladder given degree of distention. Stomach/Bowel: Moderate colonic stool burden without evidence of enteric obstruction. No discrete areas of bowel wall thickening. Normal appearance of the  terminal ileum and appendix. No pneumoperitoneum, pneumatosis or portal venous gas. Lymphatic: No bulky retroperitoneal, mesenteric, pelvic or inguinal lymphadenopathy. Reproductive: Normal appearance of the pelvic organs for age. No discrete adnexal lesion. No free fluid the pelvic cul-de-sac. Other: Minimal amount of subcutaneous edema about the midline of the low back. Musculoskeletal: No acute or aggressive osseous abnormalities. Moderate to severe multilevel lumbar spine DDD, worse at L3-L4 and L5-S1 with disc space height loss, endplate irregularity and sclerosis. Small posteriorly directed disc osteophyte complex is also seen at T11-T12. Review of the MIP images confirms the above findings. IMPRESSION: Chest CTA impression: 1. No acute cardiopulmonary disease. Specifically, no evidence of thoracic aortic aneurysm or dissection on this motion degraded examination. Abdomen and pelvic CTA impression: 1. Findings suggestive of acute uncomplicated pancreatitis with moderate to large amount of peripancreatic stranding, but without definable/drainable fluid collection/pseudocyst and no definitive evidence of pancreatic necrosis. 2. Post cholecystectomy. 3. Scattered atherosclerotic plaque within a normal caliber abdominal aorta. Aortic Atherosclerosis (ICD10-I70.0). 4. 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 Electronically Signed   By: Sandi Mariscal M.D.   On: 01/08/2020 17:11   DG HIP OPERATIVE UNILAT W OR W/O PELVIS LEFT  Result Date: 01/24/2020 CLINICAL DATA:  Left hip fracture repair FLUOROSCOPY TIME:  1 minutes 2 seconds. Images: 4 EXAM: OPERATIVE LEFT HIP (WITH PELVIS IF PERFORMED) 4 VIEWS TECHNIQUE: Fluoroscopic spot image(s) were submitted for interpretation post-operatively. COMPARISON:  None. FINDINGS: Two gamma nails have been placed across the  intertrochanteric left hip fracture. An intramedullary femoral rod has been placed as well, affixed distally with an interlocking screw. IMPRESSION: Left hip fracture repair  as above. Hardware in good position as above. Electronically Signed   By: Dorise Bullion III M.D   On: 01/24/2020 10:55   DG Hip Unilat W or Wo Pelvis 2-3 Views Left  Result Date: 01/23/2020 CLINICAL DATA:  Tripped over dog today with hip pain, initial encounter EXAM: DG HIP (WITH OR WITHOUT PELVIS) 3V LEFT COMPARISON:  None. FINDINGS: Comminuted left intratrochanteric fracture is noted with impaction and angulation at the fracture site. Pelvic ring is otherwise intact. No soft tissue abnormality is seen. IMPRESSION: Comminuted proximal left femoral fracture involving the intratrochanteric region. Electronically Signed   By: Inez Catalina M.D.   On: 01/23/2020 18:55   [place] EKG SITE RITE  Result Date: 01/12/2020 If Site Rite image not attached, placement could not be confirmed due to current cardiac rhythm.   Microbiology: Recent Results (from the past 240 hour(s))  Respiratory Panel by RT PCR (Flu A&B, Covid) - Nasopharyngeal Swab     Status: None   Collection Time: 01/23/20  6:24 PM   Specimen: Nasopharyngeal Swab  Result Value Ref Range Status   SARS Coronavirus 2 by RT PCR NEGATIVE NEGATIVE Final    Comment: (NOTE) SARS-CoV-2 target nucleic acids are NOT DETECTED.  The SARS-CoV-2 RNA is generally detectable in upper respiratoy specimens during the acute phase of infection. The lowest concentration of SARS-CoV-2 viral copies this assay can detect is 131 copies/mL. A negative result does not preclude SARS-Cov-2 infection and should not be used as the sole basis for treatment or other patient management decisions. A negative result may occur with  improper specimen collection/handling, submission of specimen other than nasopharyngeal swab, presence of viral mutation(s) within the areas targeted by this assay, and  inadequate number of viral copies (<131 copies/mL). A negative result must be combined with clinical observations, patient history, and epidemiological information. The expected result is Negative.  Fact Sheet for Patients:  PinkCheek.be  Fact Sheet for Healthcare Providers:  GravelBags.it  This test is no t yet approved or cleared by the [place] FDA and  has been authorized for detection and/or diagnosis of SARS-CoV-2 by FDA under an Emergency Use Authorization (EUA). This EUA will remain  in effect (meaning this test can be used) for the duration of the COVID-19 declaration under Section 564(b)(1) of the Act, 21 U.S.C. section 360bbb-3(b)(1), unless the authorization is terminated or revoked sooner.     Influenza A by PCR NEGATIVE NEGATIVE Final   Influenza B by PCR NEGATIVE NEGATIVE Final    Comment: (NOTE) The Xpert Xpress SARS-CoV-2/FLU/RSV assay is intended as an aid in  the diagnosis of influenza from Nasopharyngeal swab specimens and  should not be used as a sole basis for treatment. Nasal washings and  aspirates are unacceptable for Xpert Xpress SARS-CoV-2/FLU/RSV  testing.  Fact Sheet for Patients: PinkCheek.be  Fact Sheet for Healthcare Providers: GravelBags.it  This test is not yet approved or cleared by the [place] FDA and  has been authorized for detection and/or diagnosis of SARS-CoV-2 by  FDA under an Emergency Use Authorization (EUA). This EUA will remain  in effect (meaning this test can be used) for the duration of the  Covid-19 declaration under Section 564(b)(1) of the Act, 21  U.S.C. section 360bbb-3(b)(1), unless the authorization is  terminated or revoked. Performed at [facility], [place] [address] [place], [place] [address]   Surgical pcr screen     Status: Abnormal   Collection Time: 01/24/20  6:13 AM    Specimen:  Nasal Mucosa; Nasal Swab  Result Value Ref Range Status   MRSA, PCR NEGATIVE NEGATIVE Final   Staphylococcus aureus POSITIVE (A) NEGATIVE Final    Comment: (NOTE) The Xpert SA Assay (FDA approved for NASAL specimens in patients 52 years of age and older), is one component of a comprehensive surveillance program. It is not intended to diagnose infection nor to guide or monitor treatment. Performed at [facility], [place] [address] [place], [place] [address]   Respiratory Panel by RT PCR (Flu A&B, Covid) - Nasopharyngeal Swab     Status: None   Collection Time: 01/27/20 11:04 AM   Specimen: Nasopharyngeal Swab  Result Value Ref Range Status   SARS Coronavirus 2 by RT PCR NEGATIVE NEGATIVE Final    Comment: (NOTE) SARS-CoV-2 target nucleic acids are NOT DETECTED.  The SARS-CoV-2 RNA is generally detectable in upper respiratoy specimens during the acute phase of infection. The lowest concentration of SARS-CoV-2 viral copies this assay can detect is 131 copies/mL. A negative result does not preclude SARS-Cov-2 infection and should not be used as the sole basis for treatment or other patient management decisions. A negative result may occur with  improper specimen collection/handling, submission of specimen other than nasopharyngeal swab, presence of viral mutation(s) within the areas targeted by this assay, and inadequate number of viral copies (<131 copies/mL). A negative result must be combined with clinical observations, patient history, and epidemiological information. The expected result is Negative.  Fact Sheet for Patients:  PinkCheek.be  Fact Sheet for Healthcare Providers:  GravelBags.it  This test is no t yet approved or cleared by the [place] FDA and  has been authorized for detection and/or diagnosis of SARS-CoV-2 by FDA under an Emergency Use Authorization (EUA). This EUA  will remain  in effect (meaning this test can be used) for the duration of the COVID-19 declaration under Section 564(b)(1) of the Act, 21 U.S.C. section 360bbb-3(b)(1), unless the authorization is terminated or revoked sooner.     Influenza A by PCR NEGATIVE NEGATIVE Final   Influenza B by PCR NEGATIVE NEGATIVE Final    Comment: (NOTE) The Xpert Xpress SARS-CoV-2/FLU/RSV assay is intended as an aid in  the diagnosis of influenza from Nasopharyngeal swab specimens and  should not be used as a sole basis for treatment. Nasal washings and  aspirates are unacceptable for Xpert Xpress SARS-CoV-2/FLU/RSV  testing.  Fact Sheet for Patients: PinkCheek.be  Fact Sheet for Healthcare Providers: GravelBags.it  This test is not yet approved or cleared by the [place] FDA and  has been authorized for detection and/or diagnosis of SARS-CoV-2 by  FDA under an Emergency Use Authorization (EUA). This EUA will remain  in effect (meaning this test can be used) for the duration of the  Covid-19 declaration under Section 564(b)(1) of the Act, 21  U.S.C. section 360bbb-3(b)(1), unless the authorization is  terminated or revoked. Performed at [facility], [place] [address] [place], [place] [address]      Labs: Basic Metabolic Panel: Recent Labs  Lab 01/23/20 1824 01/23/20 1824 01/24/20 1146 01/25/20 0346 01/25/20 0917 01/26/20 0336 01/26/20 0815 01/27/20 0324  NA 138  --   --  143 136 141  --  141  K 3.3*  --   --  2.4* 2.9* 3.0*  --  4.5  CL 103  --   --  104 101 105  --  107  CO2 20*  --   --  27 25 28   --  26  GLUCOSE 112*  --   --  116* 180* 146*  --  109*  BUN 7*  --   --  <5* <5* <5*  --  <5*  CREATININE 0.51   < > 0.42* 0.44 0.42* 0.39*  --  0.43*  CALCIUM 9.1  --   --  8.4* 8.3* 7.9*  --  8.7*  MG  --   --   --   --  1.4*  --  2.2 2.1   < > = values in this interval not displayed.   Liver  Function Tests: Recent Labs  Lab 01/23/20 1824  AST 30  ALT 22  ALKPHOS 77  BILITOT 1.5*  PROT 6.6  ALBUMIN 3.4*   No results for input(s): LIPASE, AMYLASE in the last 168 hours. No results for input(s): AMMONIA in the last 168 hours. CBC: Recent Labs  Lab 01/23/20 1824 01/24/20 1146 01/25/20 0346 01/26/20 0336 01/27/20 0324  WBC 9.5 6.4 5.7 5.5 6.1  NEUTROABS 8.3*  --   --   --   --   HGB 10.5* 9.5* 8.1* 8.2* 9.4*  HCT 32.2* 28.2* 23.9* 24.9* 30.5*  MCV 93.6 93.4 92.6 94.3 99.0  PLT 252 189 187 198 247   BNP (last 3 results) Recent Labs    01/08/20 1450  BNP 76.0    ProBNP (last 3 results) No results for input(s): PROBNP in the last 8760 hours.  CBG: Recent Labs  Lab 01/25/20 1415  GLUCAP 154*       Signed:  Oswald Hillock MD.  [facility] 01/27/2020, 1:06 PM

## 2020-01-27 NOTE — Plan of Care (Signed)
  Problem: Education: Goal: Knowledge of General Education information will improve Description Including pain rating scale, medication(s)/side effects and non-pharmacologic comfort measures Outcome: Progressing   Problem: Nutrition: Goal: Adequate nutrition will be maintained Outcome: Progressing   Problem: Pain Managment: Goal: General experience of comfort will improve Outcome: Progressing   

## 2020-01-27 NOTE — TOC Transition Note (Signed)
Transition of Care [facility]) - CM/SW Discharge Note   Patient Details  Name: [name] MRN: [id number] Date of Birth: 26-Sep-1955  Transition of [facility]) CM/SW Contact:  Lia Hopping, [place] Phone Number: 01/27/2020, 12:10 PM   Clinical Narrative:    Patient accepted to [facility], facility will accept the pt. After 2:00pm PTAR to transport.  Nurse call report to: [phone] Patient will notify her family.    Final next level of care: Skilled Nursing Facility Barriers to Discharge: Barriers Resolved   Patient Goals and CMS Choice Patient states their goals for this hospitalization and ongoing recovery are:: go to rehab CMS Medicare.gov Compare Post Acute Care list provided to:: Patient Choice offered to / list presented to : Patient  Discharge Placement PASRR number recieved: 01/26/20 ([id number])            Patient [facility] bed at: [facility] Patient to be transferred to facility by: [place] Name of family member notified: Patient to alert her Uncle Patient and family notified of of transfer: 01/27/20  Discharge Plan and Services In-house Referral: Clinical Social Work Discharge Planning Services: [employer] Consult Post Acute Care Choice: [place]                               Social Determinants of Health (SDOH) Interventions     Readmission Risk Interventions No flowsheet data found.

## 2020-01-27 NOTE — Progress Notes (Signed)
Report called to Saralyn Pilar A at [place]

## 2020-01-27 NOTE — Progress Notes (Signed)
Occupational Therapy Treatment Patient Details Name: [name] MRN: [id number] DOB: 1955-11-19 Today's Date: 01/27/2020    History of present illness 64 year old female with history of hypertension, schizoaffective disorder was discharged but 3 days ago after being admitted for acute pancreatitis unclear etiology came to hospital after patient tripped on her dog. xrays= L IT hip fx. s/p L IM nail per Dr. Erlinda Hong 01/24/20.   OT comments  Patient require mod A and mod cues to sequence bed mobility, able to scoot out to EOB for LB dressing. Instructed patient in AE for LB dressing, pt require min A and mod cues to doff socks, mod A and mod cues to don with sock aid with patient having difficulty problem solving set up and use of device. Patient mod A with max cues to push from bed to power up to standing and mod A to pivot with rolling walker to recliner. Patient reports hopeful to go to rehab today.   Follow Up Recommendations  SNF;Supervision/Assistance - 24 hour    Equipment Recommendations  Other (comment) (TBD)       Precautions / Restrictions Precautions Precautions: Fall Precaution Comments: pt with hx of multiple falls Restrictions LLE Weight Bearing: Weight bearing as tolerated       Mobility Bed Mobility Overal bed mobility: Needs Assistance Bed Mobility: Supine to Sit     Supine to sit: Mod assist     General bed mobility comments: support for L LE, cues to sequence scooting trunk around and push up onto elbows  Transfers Overall transfer level: Needs assistance Equipment used: Rolling walker (2 wheeled) Transfers: Sit to/from [facility] Sit to Stand: Mod assist Stand pivot transfers: Mod assist       General transfer comment: max cues to push from bed vs pulling on walker, cues to utilize momentum to power up to standing, cues for reaching back for chair with sitting    Balance Overall balance assessment: Needs assistance Sitting-balance  support: Feet supported Sitting balance-Leahy Scale: Fair     Standing balance support: Bilateral upper extremity supported;During functional activity Standing balance-Leahy Scale: Poor Standing balance comment: reliant on UEs and external assist                           ADL either performed or assessed with clinical judgement   ADL Overall ADL's : Needs assistance/impaired                     Lower Body Dressing: Moderate assistance;With adaptive equipment;Cueing for sequencing;Sitting/lateral leans Lower Body Dressing Details (indicate cue type and reason): to practice doff/don socks with AE, mod cues for problem solving and mod A with sock aid including assist with set up Toilet Transfer: Moderate assistance;Stand-pivot;Cueing for safety;Cueing for sequencing;RW Toilet Transfer Details (indicate cue type and reason): to recliner, max cues for hand placement, mod A for balance         Functional mobility during ADLs: Moderate assistance;Cueing for safety;Cueing for sequencing;Rolling walker General ADL Comments: session today focus on instruction of AE for LB dressing and transfer training, patient very pleasant however requires mod to max cues for sequencing, safety               Cognition Arousal/Alertness: Awake/alert Behavior During Therapy: WFL for tasks assessed/performed Overall Cognitive Status: Impaired/Different from baseline Area of Impairment: Following commands;Problem solving;Attention  Current Attention Level: Sustained   Following Commands: Follows one step commands inconsistently;Follows one step commands with increased time     Problem Solving: Slow processing;Requires verbal cues;Requires tactile cues;Difficulty sequencing General Comments: difficulty carry over of cues to push from bed vs pulling on walker when standing, multiple cues to problem solve use of AE                    Pertinent Vitals/  Pain       Pain Assessment: Faces Faces Pain Scale: Hurts little more Pain Location: L hip Pain Descriptors / Indicators: Grimacing;Sore;Other (Comment) Pain Intervention(s): Monitored during session         Frequency  Min 2X/week        Progress Toward Goals  OT Goals(current goals can now be found in the care plan section)  Progress towards OT goals: Progressing toward goals  Acute Rehab OT Goals Patient Stated Goal: get stronger in rehab and go home to her dog OT Goal Formulation: With patient Time For Goal Achievement: 02/08/20 Potential to Achieve Goals: Good  Plan Discharge plan remains appropriate       AM-PAC OT "6 Clicks" Daily Activity     Outcome Measure   Help from another person eating meals?: None Help from another person taking care of personal grooming?: A Little Help from another person toileting, which includes using toliet, bedpan, or urinal?: A Lot Help from another person bathing (including washing, rinsing, drying)?: A Lot Help from another person to put on and taking off regular upper body clothing?: A Little Help from another person to put on and taking off regular lower body clothing?: A Lot 6 Click Score: 16    End of Session Equipment Utilized During Treatment: Gait belt;Rolling walker  OT Visit Diagnosis: Unsteadiness on feet (R26.81);Other abnormalities of gait and mobility (R26.89);Pain;Muscle weakness (generalized) (M62.81);History of falling (Z91.81) Pain - Right/Left: Left Pain - part of body: Hip   Activity Tolerance Patient tolerated treatment well   Patient Left in chair;with call bell/phone within reach;with chair alarm set   Nurse Communication Mobility status        Time: 1020-1105 OT Time Calculation (min): 45 min  Charges: OT General Charges $OT Visit: 1 Visit OT Treatments $Self Care/Home Management : 38-52 mins  Delbert Phenix OT OT pager: [phone]   Rosemary Holms 01/27/2020, 2:25 PM

## 2020-02-05 ENCOUNTER — Other Ambulatory Visit: Payer: Self-pay

## 2020-02-05 ENCOUNTER — Telehealth (HOSPITAL_COMMUNITY): Payer: Medicare (Managed Care) | Admitting: Psychiatry

## 2020-03-16 ENCOUNTER — Other Ambulatory Visit: Payer: Self-pay

## 2020-03-16 ENCOUNTER — Ambulatory Visit (INDEPENDENT_AMBULATORY_CARE_PROVIDER_SITE_OTHER): Payer: Medicare Other

## 2020-03-16 ENCOUNTER — Ambulatory Visit (INDEPENDENT_AMBULATORY_CARE_PROVIDER_SITE_OTHER): Payer: Medicare Other | Admitting: Orthopaedic Surgery

## 2020-03-16 ENCOUNTER — Encounter: Payer: Self-pay | Admitting: Orthopaedic Surgery

## 2020-03-16 DIAGNOSIS — R52 Pain, unspecified: Secondary | ICD-10-CM

## 2020-03-16 DIAGNOSIS — S72142A Displaced intertrochanteric fracture of left femur, initial encounter for closed fracture: Secondary | ICD-10-CM

## 2020-03-16 DIAGNOSIS — M25552 Pain in left hip: Secondary | ICD-10-CM

## 2020-03-16 NOTE — Progress Notes (Signed)
Post-Op Visit Note   Patient: [name]           Date of Birth: 1955/05/01           MRN: [id number] Visit Date: 03/16/2020 PCP: Bartholome Bill, MD   Assessment & Plan:  Chief Complaint:  Chief Complaint  Patient presents with  . Left Hip - Routine Post Op, Follow-up   Visit Diagnoses:  1. Closed intertrochanteric fracture of left femur, initial encounter ([place])   2. Pain     Plan:   Ron is 6 weeks status post IM nail left intertrochanteric fracture.  She is now living at home alone and ambulating with a walker.  She has been doing home exercises.  No real complaints other than some soreness in the hip with increased activity.  Surgical scars are all fully healed.  Staples removed today.  No signs of infection.  X-rays demonstrate stable fixation and healing of the fracture.  From my standpoint she is doing well.  She will continue with home exercises and strengthening.  Advance activity as tolerated follow-up in 6 weeks with two-view x-rays left hip.  Follow-Up Instructions: Return in about 6 weeks (around 04/27/2020).   Orders:  Orders Placed This Encounter  Procedures  . XR HIP UNILAT W OR W/O PELVIS 2-3 VIEWS LEFT   No orders of the defined types were placed in this encounter.   Imaging: XR HIP UNILAT W OR W/O PELVIS 2-3 VIEWS LEFT  Result Date: 03/16/2020 Stable fixation alignment of intertrochanteric fracture.  There is evidence of bony consolidation and callus formation.   PMFS History: Patient Active Problem List   Diagnosis Date Noted  . Closed intertrochanteric fracture of left femur, initial encounter ([place]) 01/24/2020  . HTN (hypertension) 01/12/2020  . Hypophosphatemia 01/12/2020  . Hypokalemia 01/12/2020  . Acute pancreatitis 01/08/2020  . Depression 01/08/2020  . Diverticulosis 01/08/2020  . Tubular adenoma of colon 01/08/2020  . Sepsis, unspecified organism ([place]) 01/08/2020  . Prolapsed internal hemorrhoids, grade 3 12/01/2015  .  Schizoaffective disorder, depressive type ([place]) 11/04/2013  . PTSD (post-traumatic stress disorder) 11/04/2013  . Cigarette nicotine dependence without complication 32/91/9166   Past Medical History:  Diagnosis Date  . Depression   . Diverticulosis   . GERD (gastroesophageal reflux disease)   . IBS (irritable bowel syndrome)   . Internal hemorrhoid    2nd degree  . Myocarditis ([place]) 2009  . Osteoarthritis   . Prolapsed internal hemorrhoids, grade 3 12/01/2015  . Schizoaffective disorder ([place])   . Tubular adenoma of colon 08/2011    Family History  Problem Relation Age of Onset  . Suicidality Father   . Depression Father   . Suicidality Sister   . Depression Sister   . Suicidality Cousin   . Suicidality Other     Past Surgical History:  Procedure Laterality Date  . BREAST BIOPSY Right 12/27/2012  . CHOLECYSTECTOMY    . HEMORRHOID BANDING    . INTRAMEDULLARY (IM) NAIL INTERTROCHANTERIC Left 01/24/2020   Procedure: INTRAMEDULLARY (IM) NAIL INTERTROCHANTRIC;  Surgeon: Leandrew Koyanagi, MD;  Location: [facility];  Service: Orthopedics;  Laterality: Left;   Social History   Occupational History  . Occupation: Disabled  Tobacco Use  . Smoking status: Former Smoker    Packs/day: 1.00    Years: 38.00    Pack years: 38.00    Types: Cigarettes    Quit date: 05/06/2018    Years since quitting: 1.8  . Smokeless tobacco: Never Used  Vaping Use  . Vaping Use: Never used  Substance and Sexual Activity  . Alcohol use: No    Alcohol/week: 0.0 standard drinks  . Drug use: No  . Sexual activity: Not Currently

## 2020-03-25 ENCOUNTER — Telehealth (HOSPITAL_COMMUNITY): Payer: Medicare Other | Admitting: Psychiatry

## 2020-03-25 ENCOUNTER — Other Ambulatory Visit: Payer: Self-pay

## 2020-03-25 DIAGNOSIS — F4310 Post-traumatic stress disorder, unspecified: Secondary | ICD-10-CM

## 2020-03-25 DIAGNOSIS — F251 Schizoaffective disorder, depressive type: Secondary | ICD-10-CM

## 2020-03-25 MED ORDER — SERTRALINE HCL 100 MG PO TABS: 100.0000 mg | ORAL_TABLET | Freq: Every day | ORAL | 0 refills | Status: DC

## 2020-03-25 MED ORDER — TRAZODONE HCL 50 MG PO TABS: 50.0000 mg | ORAL_TABLET | Freq: Every evening | ORAL | 0 refills | Status: DC | PRN

## 2020-03-25 MED ORDER — ZIPRASIDONE HCL 80 MG PO CAPS: 80.0000 mg | ORAL_CAPSULE | Freq: Two times a day (BID) | ORAL | 0 refills | Status: DC

## 2020-03-25 MED ORDER — BENZTROPINE MESYLATE 1 MG PO TABS: 1.0000 mg | ORAL_TABLET | Freq: Every day | ORAL | 0 refills | Status: DC
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
## 2020-04-06 ENCOUNTER — Telehealth (HOSPITAL_COMMUNITY): Payer: Self-pay | Admitting: *Deleted

## 2020-04-06 NOTE — Telephone Encounter (Signed)
Writer returned pt call. First attempt VM is full. Second time completed. Pt called stating that none of her meds were refilled on last appointment on 03/25/20. Writer advised pt that 4 Rx's were e-scribed by MD on 03/25/20. Conversation somewhat convoluted but after asking for Flexeril and Ultram several times it was determined that pt says she needs the additional 50mg  of Zoloft qhs or she can't sleep and that she takes 200mg  Zoloft qam.  Please review and advise.

## 2020-04-07 NOTE — Telephone Encounter (Signed)
She is taking 200mg  of Zoloft- that's the max dose. We were at 100mg  at our last visit so she's increased the dose on her own. Is she asking for an increase in Trazodone? If her insomnia is due to pain then it needs to be addressed by her PCP or whoever is prescribing her pain meds.

## 2020-04-08 ENCOUNTER — Telehealth (HOSPITAL_COMMUNITY): Payer: Self-pay | Admitting: *Deleted

## 2020-04-08 NOTE — Telephone Encounter (Signed)
I left pt VM regarding her request for medication changes. Pt returned call and says that she wants you to call her re medications.

## 2020-04-08 NOTE — Telephone Encounter (Signed)
I dont see the message

## 2020-04-21 ENCOUNTER — Other Ambulatory Visit (HOSPITAL_COMMUNITY): Payer: Self-pay | Admitting: Psychiatry

## 2020-04-21 DIAGNOSIS — F251 Schizoaffective disorder, depressive type: Secondary | ICD-10-CM

## 2020-04-27 ENCOUNTER — Ambulatory Visit: Payer: Medicare Other | Admitting: Orthopaedic Surgery

## 2020-05-04 ENCOUNTER — Ambulatory Visit (INDEPENDENT_AMBULATORY_CARE_PROVIDER_SITE_OTHER): Payer: Medicare HMO | Admitting: Orthopaedic Surgery

## 2020-05-04 ENCOUNTER — Ambulatory Visit (INDEPENDENT_AMBULATORY_CARE_PROVIDER_SITE_OTHER): Payer: Medicare HMO

## 2020-05-04 DIAGNOSIS — S72142A Displaced intertrochanteric fracture of left femur, initial encounter for closed fracture: Secondary | ICD-10-CM

## 2020-05-04 NOTE — Progress Notes (Signed)
Post-Op Visit Note   Patient: [name]           Date of Birth: 01/23/1956           MRN: [id number] Visit Date: 05/04/2020 PCP: Bartholome Bill, MD   Assessment & Plan:  Chief Complaint:  Chief Complaint  Patient presents with  . Other     Post op follow up   Visit Diagnoses:  1. Closed intertrochanteric fracture of left femur, initial encounter [facility])     Plan  Catherine is a little over 3 months status post IM nail left intertrochanteric hip fracture. She is overall doing well and ambulating with a walker. She was not using a walker prior to this injury. She states that her knee arthritis has been aggravated as a result of hip fracture. She did not receive any home health PT after she was discharged from the SNF.  Left hip shows fully healed surgical scars. She has for fairly good range of motion of the hip without pain. Ambulates with a walker with slight limp and slow gait. X-rays demonstrate healed intertrochanteric fracture.  She is progressing and recovering appropriately. Her fracture has healed. We will make a referral to home health PT for strengthening. Recheck in 3 months with two-view x-rays of the left hip.   Follow-Up Instructions: Return in about 3 months (around 08/02/2020).   Orders:  Orders Placed This Encounter  Procedures  . XR HIP UNILAT W OR W/O PELVIS 2-3 VIEWS LEFT   No orders of the defined types were placed in this encounter.   Imaging: XR HIP UNILAT W OR W/O PELVIS 2-3 VIEWS LEFT  Result Date: 05/04/2020 Abundant callus formation of intertrochanteric fracture. No hardware complications.   PMFS History: Patient Active Problem List   Diagnosis Date Noted  . Closed intertrochanteric fracture of left femur, initial encounter ([place]) 01/24/2020  . HTN (hypertension) 01/12/2020  . Hypophosphatemia 01/12/2020  . Hypokalemia 01/12/2020  . Acute pancreatitis 01/08/2020  . Depression 01/08/2020  . Diverticulosis 01/08/2020  . Tubular  adenoma of colon 01/08/2020  . Sepsis, unspecified organism ([place]) 01/08/2020  . Prolapsed internal hemorrhoids, grade 3 12/01/2015  . Schizoaffective disorder, depressive type ([place]) 11/04/2013  . PTSD (post-traumatic stress disorder) 11/04/2013  . Cigarette nicotine dependence without complication 40/98/1191   Past Medical History:  Diagnosis Date  . Depression   . Diverticulosis   . GERD (gastroesophageal reflux disease)   . IBS (irritable bowel syndrome)   . Internal hemorrhoid    2nd degree  . Myocarditis ([place]) 2009  . Osteoarthritis   . Prolapsed internal hemorrhoids, grade 3 12/01/2015  . Schizoaffective disorder ([place])   . Tubular adenoma of colon 08/2011    Family History  Problem Relation Age of Onset  . Suicidality Father   . Depression Father   . Suicidality Sister   . Depression Sister   . Suicidality Cousin   . Suicidality Other     Past Surgical History:  Procedure Laterality Date  . BREAST BIOPSY Right 12/27/2012  . CHOLECYSTECTOMY    . HEMORRHOID BANDING    . INTRAMEDULLARY (IM) NAIL INTERTROCHANTERIC Left 01/24/2020   Procedure: INTRAMEDULLARY (IM) NAIL INTERTROCHANTRIC;  Surgeon: Leandrew Koyanagi, MD;  Location: [facility];  Service: Orthopedics;  Laterality: Left;   Social History   Occupational History  . Occupation: Disabled  Tobacco Use  . Smoking status: Former Smoker    Packs/day: 1.00    Years: 38.00    Pack years: 38.00  Types: Cigarettes    Quit date: 05/06/2018    Years since quitting: 1.9  . Smokeless tobacco: Never Used  Vaping Use  . Vaping Use: Never used  Substance and Sexual Activity  . Alcohol use: No    Alcohol/week: 0.0 standard drinks  . Drug use: No  . Sexual activity: Not Currently

## 2020-05-17 ENCOUNTER — Telehealth: Payer: Self-pay | Admitting: Orthopaedic Surgery

## 2020-05-17 NOTE — Telephone Encounter (Signed)
[facility] called requesting verbal orders for home health physical therapy.frequency  1X week 1 week, 2X w for 4 w, 1X for 4w.  CB [phone]

## 2020-05-17 NOTE — Telephone Encounter (Signed)
Called [facility] to approve orders.  

## 2020-05-20 ENCOUNTER — Telehealth (HOSPITAL_COMMUNITY): Payer: Self-pay | Admitting: *Deleted

## 2020-05-20 ENCOUNTER — Other Ambulatory Visit (HOSPITAL_COMMUNITY): Payer: Self-pay | Admitting: Psychiatry

## 2020-05-20 DIAGNOSIS — F4310 Post-traumatic stress disorder, unspecified: Secondary | ICD-10-CM

## 2020-05-20 DIAGNOSIS — F5102 Adjustment insomnia: Secondary | ICD-10-CM

## 2020-05-20 DIAGNOSIS — F251 Schizoaffective disorder, depressive type: Secondary | ICD-10-CM

## 2020-05-20 MED ORDER — TRAZODONE HCL 100 MG PO TABS: 100.0000 mg | ORAL_TABLET | Freq: Every evening | ORAL | 1 refills | Status: DC | PRN

## 2020-05-20 NOTE — Telephone Encounter (Signed)
Order for Trazodone 100mg  qHS sent to pharmacy

## 2020-05-20 NOTE — Telephone Encounter (Signed)
Pt called requesting to increase Trazodone to 100mg  as she has had recent increased difficulty falling asleep. Please review.

## 2020-05-21 ENCOUNTER — Other Ambulatory Visit (HOSPITAL_COMMUNITY): Payer: Self-pay | Admitting: Psychiatry

## 2020-05-21 DIAGNOSIS — F251 Schizoaffective disorder, depressive type: Secondary | ICD-10-CM

## 2020-05-21 DIAGNOSIS — F4310 Post-traumatic stress disorder, unspecified: Secondary | ICD-10-CM

## 2020-05-24 ENCOUNTER — Emergency Department (HOSPITAL_COMMUNITY)
Admission: EM | Admit: 2020-05-24 | Discharge: 2020-05-26 | Disposition: A | Discharge status: Other Acute Inpatient Hospital | Payer: Medicare HMO | Attending: Emergency Medicine | Admitting: Emergency Medicine

## 2020-05-24 ENCOUNTER — Other Ambulatory Visit: Payer: Self-pay

## 2020-05-24 DIAGNOSIS — Z87891 Personal history of nicotine dependence: Secondary | ICD-10-CM | POA: Diagnosis not present

## 2020-05-24 DIAGNOSIS — Z20822 Contact with and (suspected) exposure to covid-19: Secondary | ICD-10-CM | POA: Insufficient documentation

## 2020-05-24 DIAGNOSIS — I10 Essential (primary) hypertension: Secondary | ICD-10-CM | POA: Insufficient documentation

## 2020-05-24 DIAGNOSIS — F250 Schizoaffective disorder, bipolar type: Secondary | ICD-10-CM | POA: Insufficient documentation

## 2020-05-24 DIAGNOSIS — Z046 Encounter for general psychiatric examination, requested by authority: Secondary | ICD-10-CM | POA: Diagnosis present

## 2020-05-24 DIAGNOSIS — R45851 Suicidal ideations: Secondary | ICD-10-CM | POA: Diagnosis not present

## 2020-05-24 LAB — COMPREHENSIVE METABOLIC PANEL
ALT: 18 U/L (ref 0–44)
AST: 28 U/L (ref 15–41)
Albumin: 4 g/dL (ref 3.5–5.0)
Alkaline Phosphatase: 127 U/L — ABNORMAL HIGH (ref 38–126)
Anion gap: 13 (ref 5–15)
BUN: 15 mg/dL (ref 8–23)
CO2: 20 mmol/L — ABNORMAL LOW (ref 22–32)
Calcium: 9.2 mg/dL (ref 8.9–10.3)
Chloride: 107 mmol/L (ref 98–111)
Creatinine, Ser: 0.63 mg/dL (ref 0.44–1.00)
GFR, Estimated: >60 mL/min (ref >60)
Glucose, Bld: 89 mg/dL (ref 70–99)
Potassium: 3.6 mmol/L (ref 3.5–5.1)
Sodium: 140 mmol/L (ref 135–145)
Total Bilirubin: 1.1 mg/dL (ref 0.3–1.2)
Total Protein: 7.3 g/dL (ref 6.5–8.1)

## 2020-05-24 LAB — CBC WITH DIFFERENTIAL/PLATELET
Abs Immature Granulocytes: 0.04 10*3/uL (ref 0.00–0.07)
Basophils Absolute: 0.1 10*3/uL (ref 0.0–0.1)
Basophils Relative: 1 %
Eosinophils Absolute: 0.1 10*3/uL (ref 0.0–0.5)
Eosinophils Relative: 2 %
HCT: 38.8 % (ref 36.0–46.0)
Hemoglobin: 12.6 g/dL (ref 12.0–15.0)
Immature Granulocytes: 1 %
Lymphocytes Relative: 15 %
Lymphs Abs: 1.2 10*3/uL (ref 0.7–4.0)
MCH: 28.9 pg (ref 26.0–34.0)
MCHC: 32.5 g/dL (ref 30.0–36.0)
MCV: 89 fL (ref 80.0–100.0)
Monocytes Absolute: 0.5 10*3/uL (ref 0.1–1.0)
Monocytes Relative: 6 %
Neutro Abs: 6.3 10*3/uL (ref 1.7–7.7)
Neutrophils Relative %: 75 %
Platelets: 244 10*3/uL (ref 150–400)
RBC: 4.36 MIL/uL (ref 3.87–5.11)
RDW: 14.6 % (ref 11.5–15.5)
WBC: 8.2 10*3/uL (ref 4.0–10.5)
nRBC: 0 % (ref 0.0–0.2)

## 2020-05-24 LAB — RESP PANEL BY RT-PCR (FLU A&B, COVID) ARPGX2
Influenza A by PCR: NEGATIVE
Influenza B by PCR: NEGATIVE
SARS Coronavirus 2 by RT PCR: NEGATIVE

## 2020-05-24 LAB — RAPID URINE DRUG SCREEN, HOSP PERFORMED
Amphetamines: NOT DETECTED
Barbiturates: NOT DETECTED
Benzodiazepines: NOT DETECTED
Cocaine: NOT DETECTED
Opiates: NOT DETECTED
Tetrahydrocannabinol: NOT DETECTED

## 2020-05-24 LAB — ETHANOL: Alcohol, Ethyl (B): <10 mg/dL (ref <10)

## 2020-05-24 LAB — MAGNESIUM: Magnesium: 2.1 mg/dL (ref 1.7–2.4)

## 2020-05-24 LAB — ACETAMINOPHEN LEVEL: Acetaminophen (Tylenol), Serum: <10 ug/mL — ABNORMAL LOW (ref 10–30)

## 2020-05-24 LAB — SALICYLATE LEVEL: Salicylate Lvl: <7 mg/dL — ABNORMAL LOW (ref 7.0–30.0)

## 2020-05-24 NOTE — ED Provider Notes (Signed)
[name] Provider Note   CSN: [id number] Arrival date & time: 05/24/20  1519     History Chief Complaint  Patient presents with  . Ingestion    [name] is a 65 y.o. female presenting for evaluation of suicidal thoughts and possible ingestion.  Level 5 caveat due to psychiatric illness.  Patient not providing much history.  She states she wants to die.  She then states she dumped out her pills, did not take them.  She reports nausea, no vomiting.  She has pain in her left leg where she recently had a broken bone, no new pain elsewhere.  Additional history obtained from triage note.  Per triage note, patient's house was in disarray with pill bottles and pills all over the place.  Concerned that patient may have taken Zoloft, benztropine, trazodone.  Benztropine and trazodone bottles are empty, they were filled on 03/25/2020 with 90 pills (should be 30 left).  There were about 9 Zoloft pills left, filled at the same time with same number.    HPI     Past Medical History:  Diagnosis Date  . Depression   . Diverticulosis   . GERD (gastroesophageal reflux disease)   . IBS (irritable bowel syndrome)   . Internal hemorrhoid    2nd degree  . Myocarditis ([place]) 2009  . Osteoarthritis   . Prolapsed internal hemorrhoids, grade 3 12/01/2015  . Schizoaffective disorder ([place])   . Tubular adenoma of colon 08/2011    Patient Active Problem List   Diagnosis Date Noted  . Closed intertrochanteric fracture of left femur, initial encounter ([place]) 01/24/2020  . HTN (hypertension) 01/12/2020  . Hypophosphatemia 01/12/2020  . Hypokalemia 01/12/2020  . Acute pancreatitis 01/08/2020  . Depression 01/08/2020  . Diverticulosis 01/08/2020  . Tubular adenoma of colon 01/08/2020  . Sepsis, unspecified organism ([place]) 01/08/2020  . Prolapsed internal hemorrhoids, grade 3 12/01/2015  . Schizoaffective disorder, depressive type ([place]) 11/04/2013  .  PTSD (post-traumatic stress disorder) 11/04/2013  . Cigarette nicotine dependence without complication 30/86/5784    Past Surgical History:  Procedure Laterality Date  . BREAST BIOPSY Right 12/27/2012  . CHOLECYSTECTOMY    . HEMORRHOID BANDING    . INTRAMEDULLARY (IM) NAIL INTERTROCHANTERIC Left 01/24/2020   Procedure: INTRAMEDULLARY (IM) NAIL INTERTROCHANTRIC;  Surgeon: Leandrew Koyanagi, MD;  Location: [facility];  Service: Orthopedics;  Laterality: Left;     OB History   No obstetric history on file.     Family History  Problem Relation Age of Onset  . Suicidality Father   . Depression Father   . Suicidality Sister   . Depression Sister   . Suicidality Cousin   . Suicidality Other     Social History   Tobacco Use  . Smoking status: Former Smoker    Packs/day: 1.00    Years: 38.00    Pack years: 38.00    Types: Cigarettes    Quit date: 05/06/2018    Years since quitting: 2.0  . Smokeless tobacco: Never Used  Vaping Use  . Vaping Use: Never used  Substance Use Topics  . Alcohol use: No    Alcohol/week: 0.0 standard drinks  . Drug use: No    Home Medications Prior to Admission medications   Medication Sig Start Date End Date Taking? Authorizing Provider  benztropine (COGENTIN) 1 MG tablet Take 1 tablet (1 mg total) by mouth daily. 03/25/20  Yes [name], MD  clobetasol (TEMOVATE) 0.05 % external solution Apply  1 application topically 2 (two) times daily.  12/13/19   [provider]  docusate sodium (COLACE) 100 MG capsule Take 1 capsule (100 mg total) by mouth 2 (two) times daily. 01/27/20   Oswald Hillock, MD  enoxaparin (LOVENOX) 40 MG/0.4ML injection Inject 0.4 mLs (40 mg total) into the skin daily for 14 days. 01/24/20 02/07/20  Leandrew Koyanagi, MD  fluocinonide cream (LIDEX) 6.25 % Apply 1 application topically 3 (three) times daily.  11/03/19   [provider]  methocarbamol (ROBAXIN) 500 MG tablet Take 1 tablet (500 mg total) by mouth every 6  (six) hours as needed for muscle spasms. 01/27/20   Oswald Hillock, MD  omeprazole (PRILOSEC) 40 MG capsule Take 40 mg by mouth in the morning and at bedtime.     [provider]  ondansetron (ZOFRAN) 4 MG tablet Take 4 mg by mouth every 8 (eight) hours as needed for nausea or vomiting.  09/25/19   [provider]  oxyCODONE-acetaminophen (PERCOCET) 5-325 MG tablet Take 1-2 tablets by mouth every 8 (eight) hours as needed for severe pain. 01/24/20   Leandrew Koyanagi, MD  sertraline (ZOLOFT) 100 MG tablet Take 1 tablet (100 mg total) by mouth daily. 03/25/20   [name], MD  sucralfate (CARAFATE) 1 GM/10ML suspension Take 10 mLs (1 g total) by mouth 4 (four) times daily -  with meals and at bedtime. Patient taking differently: Take 1 g by mouth 4 (four) times daily.  01/20/20 02/19/20  Donne Hazel, MD  traZODone (DESYREL) 100 MG tablet Take 1 tablet (100 mg total) by mouth at bedtime as needed for sleep. 05/20/20   [name], MD  Vitamin D, Ergocalciferol, (DRISDOL) 1.25 MG (50000 UNIT) CAPS capsule Take 50,000 Units by mouth once a week. 11/21/19   [provider]  ziprasidone (GEODON) 80 MG capsule Take 1 capsule (80 mg total) by mouth in the morning and at bedtime. 03/25/20   [name], MD    Allergies    Penicillins and Sulfa antibiotics  Review of Systems   Review of Systems  Unable to perform ROS: Psychiatric disorder  Gastrointestinal: Positive for nausea.  Psychiatric/Behavioral: Positive for suicidal ideas.    Physical Exam Updated Vital Signs BP (!) 144/76   Pulse 78   Temp 98.2 F (36.8 C) (Oral)   Resp 20   Ht 5\' 2"  (1.575 m)   Wt 72 kg   SpO2 96%   BMI 29.03 kg/m   Physical Exam Vitals and nursing note reviewed.  Constitutional:      General: She is not in acute distress.    Appearance: She is well-developed and well-nourished.     Comments: Appears nontoxic  HENT:     Head: Normocephalic and atraumatic.  Eyes:      Extraocular Movements: Extraocular movements intact and EOM normal.     Conjunctiva/sclera: Conjunctivae normal.     Pupils: Pupils are equal, round, and reactive to light.  Cardiovascular:     Rate and Rhythm: Normal rate and regular rhythm.     Pulses: Normal pulses and intact distal pulses.  Pulmonary:     Effort: Pulmonary effort is normal. No respiratory distress.     Breath sounds: Normal breath sounds. No wheezing.  Abdominal:     General: There is no distension.     Palpations: Abdomen is soft. There is no mass.     Tenderness: There is no abdominal tenderness. There is no guarding or rebound.  Musculoskeletal:        General: Normal range of motion.     Cervical back: Normal range of motion and neck supple.  Skin:    General: Skin is warm and dry.     Capillary Refill: Capillary refill takes less than 2 seconds.  Neurological:     Mental Status: She is alert and oriented to person, place, and time.  Psychiatric:        Mood and Affect: Mood and affect normal. Affect is flat.        Speech: She is noncommunicative.        Behavior: Behavior is withdrawn.        Thought Content: Thought content includes suicidal ideation.     Comments: Slow to respond to questions, however unsure if this is due to confusion versus not wanting to speak.  Withdrawn behavior.  Selectively answering questions.  Reports suicidal thoughts.  Denies ingestion.     ED Results / Procedures / Treatments   Labs (all labs ordered are listed, but only abnormal results are displayed) Labs Reviewed  COMPREHENSIVE METABOLIC PANEL - Abnormal; Notable for the following components:      Result Value   CO2 20 (*)    Alkaline Phosphatase 127 (*)    All other components within normal limits  ACETAMINOPHEN LEVEL - Abnormal; Notable for the following components:   Acetaminophen (Tylenol), Serum <10 (*)    All other components within normal limits  SALICYLATE LEVEL - Abnormal; Notable for the following  components:   Salicylate Lvl <0.2 (*)    All other components within normal limits  RESP PANEL BY RT-PCR (FLU A&B, COVID) ARPGX2  CBC WITH DIFFERENTIAL/PLATELET  ETHANOL  MAGNESIUM  RAPID URINE DRUG SCREEN, HOSP PERFORMED    EKG EKG Interpretation  Date/Time:  Monday May 24 2020 16:00:33 EST Ventricular Rate:  78 PR Interval:  168 QRS Duration: 86 QT Interval:  388 QTC Calculation: 442 R Axis:   52 Text Interpretation: Normal sinus rhythm Nonspecific T wave abnormality Abnormal ECG Confirmed by Noemi Chapel [phone]) on 05/24/2020 9:34:59 PM   Radiology No results found.  Procedures Procedures   Medications Ordered in ED Medications - No data to display  ED Course  I have reviewed the triage vital signs and the nursing notes.  Pertinent labs & imaging results that were available during my care of the patient were reviewed by me and considered in my medical decision making (see chart for details).    MDM Rules/Calculators/A&P                          Patient presenting for suicidal thoughts and possible ingestion.  On exam, patient appears nontoxic.  She does have withdrawn behavior, is very noncommunicative.  She is selectively answering questions.  Slow to respond, unsure if this is due to the confusion versus not wanting to answer questions.  While patient denies ingestion, when asked why she dumped out her pills, she states because she wanted to die.  As such, I am concerned that she may have actually taken the pills.  Discussed with poison control.  Recommends monitoring for tachycardia and prolonged QTC and possible seizures and CNS depression due to sertraline and benzo to wean.  Recommends monitoring for CNS depression, bradycardia, hypotension, flux of tachycardia and prolonged QTC due to the trazodone.  Ensure that patient can void prior to medical clearance.  6 to 8 hours until cleared.  Supportive care  as needed, preference of benzos/barbs as needed for  agitation.  If QTC is greater than 500, call back.  Repeat EKG prior to clearance.   Initial EKG reassuring, QTC normal.  Labs interpreted by me, overall reassuring.  Will monitor for 6-8 hours on cardiac monitoring prior to clearance.  After 6-1/2 hours in the ED, patient without significant change in CNS or agitation.  Repeat EKG overall reassuring, no prolonged QTC.  Patient has been able to void.  As such, patient is medically cleared for psychiatric evaluation at this time.  Patient is not currently under IVC, however she attempts to leave, she should be placed under IVC due to suicidal thoughts and possible attempt.  The patient has been placed in psychiatric observation due to the need to provide a safe environment for the patient while obtaining psychiatric consultation and evaluation, as well as ongoing medical and medication management to treat the patient's condition.  The patient has not been placed under full IVC at this time.   Final Clinical Impression(s) / ED Diagnoses Final diagnoses:  Suicidal ideation    Rx / DC Orders ED Discharge Orders    None       Franchot Heidelberg, PA-C 05/24/20 2218    Gareth Morgan, MD 05/27/20 [phone]
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
## 2020-05-25 DIAGNOSIS — F250 Schizoaffective disorder, bipolar type: Secondary | ICD-10-CM | POA: Insufficient documentation

## 2020-05-25 NOTE — ED Notes (Signed)
Pt calm and cooperative, nadn, pt resting requesting graham crackers. Snacks and drink provided

## 2020-05-25 NOTE — ED Provider Notes (Signed)
Pt observed for several hours.  Repeat EKG shows no QRS widening and a normal QTc.  Pt is medically clear for psych.   Isla Pence, MD 05/25/20 1119

## 2020-05-25 NOTE — Progress Notes (Addendum)
CSW spoke with Jeanett Schlein at [facility], who is reviewing pt for admission. They are requesting a note from the ED provider be put in pt's chart noting she is medically stable. RN notified.    Audree Camel, MSW, LCSW, LCAS Clinical Social Worker II Disposition CSW [phone]   UPDATE: MD note documenting medical stability faxed to [facility].

## 2020-05-25 NOTE — ED Notes (Signed)
Pt in hospital scrubs and wanded by security

## 2020-05-25 NOTE — ED Notes (Addendum)
This RN spoke with pts son, Marylyn Ishihara. Updated him that pt will be going to [facility] tomorrow morning.

## 2020-05-25 NOTE — Progress Notes (Signed)
Pt accepted to [facility]      Dr. Juliane Lack is the accepting/attending provider   Call report to [phone]  Hope @ [facility] ED notified.    If pt is placed under IVC, the completed IVC paperwork should be faxed to [phone]  Pt is scheduled to arrive at [facility] on 2/16 after [place], MSW, LCSW, [place] Worker II Disposition CSW [phone]
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
## 2020-05-25 NOTE — Progress Notes (Signed)
Pt meets inpatient criteria per Vertis Kelch, NP. Referral information has been sent to the following hospitals for review:  [facility]  [facility]  [facility]     Disposition will continue to assist with inpatient placement.   Audree Camel, MSW, LCSW, [place] Clinical Social Worker II Disposition CSW [phone]

## 2020-05-25 NOTE — BH Assessment (Signed)
Per Leandro Reasoner, NP, patient is recommended for inpatient geriatric placement

## 2020-05-26 NOTE — ED Provider Notes (Signed)
Emergency Medicine Observation Re-evaluation Note  [name] is a 65 y.o. female, seen on rounds today.  Pt initially presented to the ED for complaints of Ingestion and Suicidal Currently, the patient is sitting up eating breakfast   Physical Exam  BP 132/63 (BP Location: Left Arm)   Pulse 67   Temp 98.1 F (36.7 C) (Oral)   Resp 20   Ht 5\' 2"  (1.575 m)   Wt 72 kg   SpO2 98%   BMI 29.03 kg/m  Physical Exam General: Awake and alert Lungs: Resp even and unlabored Psych: Calm and cooperative  ED Course / MDM  EKG:EKG Interpretation  Date/Time:  Monday May 24 2020 21:36:39 EST Ventricular Rate:  72 PR Interval:  164 QRS Duration: 84 QT Interval:  418 QTC Calculation: 457 R Axis:   54 Text Interpretation: Normal sinus rhythm Nonspecific T wave abnormality Abnormal ECG Confirmed by Ripley Fraise [phone]) on 05/24/2020 11:51:47 PM    I have reviewed the labs performed to date as well as medications administered while in observation.  Recent changes in the last 24 hours include none  Plan  Current plan is for transfer to Psych facility. Patient is not under full IVC at this time.   Truddie Hidden, MD 05/26/20 [phone]

## 2020-05-26 NOTE — ED Notes (Signed)
Patient belongings were given to officer along with Gilford Rile. Hermine RN at [facility] were made aware that patient was leaving the building. Patient son made aware of her departure.

## 2020-05-26 NOTE — ED Notes (Signed)
Report given to Hermine RN at [facility]

## 2020-05-26 NOTE — ED Notes (Signed)
UA, repeat ekg, then psych clear Breakfast order placed

## 2020-06-16 ENCOUNTER — Other Ambulatory Visit (HOSPITAL_COMMUNITY): Payer: Self-pay

## 2020-06-16 ENCOUNTER — Telehealth (HOSPITAL_COMMUNITY): Payer: Self-pay

## 2020-06-16 NOTE — Telephone Encounter (Signed)
error 

## 2020-06-17 ENCOUNTER — Other Ambulatory Visit: Payer: Self-pay

## 2020-06-17 ENCOUNTER — Telehealth (HOSPITAL_COMMUNITY): Payer: Medicare HMO | Admitting: Psychiatry

## 2020-06-18 ENCOUNTER — Telehealth (HOSPITAL_COMMUNITY): Payer: Self-pay

## 2020-06-18 NOTE — Telephone Encounter (Signed)
Received a fax from [facility] requesting a refill on patient's Sertraline 100mg . Last sent in on 03/25/20 #90 0 refills. Followup appointment scheduled for 07/01/20. Thank you

## 2020-06-24 ENCOUNTER — Other Ambulatory Visit (HOSPITAL_COMMUNITY): Payer: Self-pay | Admitting: Psychiatry

## 2020-06-24 DIAGNOSIS — F251 Schizoaffective disorder, depressive type: Secondary | ICD-10-CM

## 2020-06-24 DIAGNOSIS — F4310 Post-traumatic stress disorder, unspecified: Secondary | ICD-10-CM

## 2020-06-24 MED ORDER — SERTRALINE HCL 100 MG PO TABS
100.0000 mg | ORAL_TABLET | Freq: Every day | ORAL | 0 refills | Status: DC
Start: 2020-06-24 — End: 2020-07-01

## 2020-07-01 ENCOUNTER — Telehealth (INDEPENDENT_AMBULATORY_CARE_PROVIDER_SITE_OTHER): Payer: Medicare HMO | Admitting: Psychiatry

## 2020-07-01 ENCOUNTER — Other Ambulatory Visit: Payer: Self-pay

## 2020-07-01 DIAGNOSIS — F4310 Post-traumatic stress disorder, unspecified: Secondary | ICD-10-CM | POA: Diagnosis not present

## 2020-07-01 DIAGNOSIS — F5102 Adjustment insomnia: Secondary | ICD-10-CM

## 2020-07-01 DIAGNOSIS — F251 Schizoaffective disorder, depressive type: Secondary | ICD-10-CM

## 2020-07-01 MED ORDER — TRAZODONE HCL 100 MG PO TABS
100.0000 mg | ORAL_TABLET | Freq: Every evening | ORAL | 0 refills | Status: DC | PRN
Start: 2020-07-01 — End: 2020-08-26

## 2020-07-01 MED ORDER — SERTRALINE HCL 100 MG PO TABS
100.0000 mg | ORAL_TABLET | Freq: Every day | ORAL | 0 refills | Status: DC
Start: 2020-07-01 — End: 2020-08-26

## 2020-07-01 MED ORDER — BENZTROPINE MESYLATE 1 MG PO TABS: 1.0000 mg | ORAL_TABLET | Freq: Every day | ORAL | 0 refills | Status: DC

## 2020-07-01 MED ORDER — ZIPRASIDONE HCL 80 MG PO CAPS: 80.0000 mg | ORAL_CAPSULE | Freq: Two times a day (BID) | ORAL | 0 refills | Status: DC
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
## 2020-07-30 ENCOUNTER — Other Ambulatory Visit: Payer: Self-pay | Admitting: Family Medicine

## 2020-07-30 DIAGNOSIS — Z1231 Encounter for screening mammogram for malignant neoplasm of breast: Secondary | ICD-10-CM

## 2020-08-23 ENCOUNTER — Ambulatory Visit: Payer: Medicare HMO

## 2020-08-26 ENCOUNTER — Other Ambulatory Visit: Payer: Self-pay | Admitting: *Deleted

## 2020-08-26 ENCOUNTER — Telehealth (INDEPENDENT_AMBULATORY_CARE_PROVIDER_SITE_OTHER): Payer: Medicare HMO | Admitting: Psychiatry

## 2020-08-26 ENCOUNTER — Other Ambulatory Visit: Payer: Self-pay

## 2020-08-26 DIAGNOSIS — F251 Schizoaffective disorder, depressive type: Secondary | ICD-10-CM | POA: Diagnosis not present

## 2020-08-26 DIAGNOSIS — F4310 Post-traumatic stress disorder, unspecified: Secondary | ICD-10-CM

## 2020-08-26 DIAGNOSIS — F5102 Adjustment insomnia: Secondary | ICD-10-CM

## 2020-08-26 DIAGNOSIS — Z87891 Personal history of nicotine dependence: Secondary | ICD-10-CM

## 2020-08-26 DIAGNOSIS — F17210 Nicotine dependence, cigarettes, uncomplicated: Secondary | ICD-10-CM

## 2020-08-26 MED ORDER — ZIPRASIDONE HCL 80 MG PO CAPS: 80.0000 mg | ORAL_CAPSULE | Freq: Two times a day (BID) | ORAL | 0 refills | Status: DC

## 2020-08-26 MED ORDER — SERTRALINE HCL 100 MG PO TABS: 100.0000 mg | ORAL_TABLET | Freq: Every day | ORAL | 0 refills | Status: DC

## 2020-08-26 MED ORDER — BENZTROPINE MESYLATE 1 MG PO TABS: 1.0000 mg | ORAL_TABLET | Freq: Every day | ORAL | 0 refills | Status: DC

## 2020-08-26 MED ORDER — TRAZODONE HCL 100 MG PO TABS: 100.0000 mg | ORAL_TABLET | Freq: Every evening | ORAL | 0 refills | Status: DC | PRN

## 2020-08-26 NOTE — Progress Notes (Signed)
Virtual Visit via Video Note  I connected with [name] on 08/26/20 at  3:00 PM EDT by a video enabled telemedicine application and verified that I am speaking with the correct person using two identifiers.  Location: Patient: in bathroom Provider: office   I discussed the limitations of evaluation and management by telemedicine and the availability of in person appointments. The patient expressed understanding and agreed to proceed.  History of Present Illness: "I am doing pretty good". Her outlook has improved and she is not so negative about people in general. She explained her end of things to a few people and it made her feel better. She was depressed daily until a few days ago when she talked to a few people.  She was having some anhedonia and low motivation. This past weekend she cleaned up her house made her feel better. At least 3-4 nights a week she has trouble falling asleep and staying asleep. It is due to nightmares and hypervigilance. The Neurontin and Trazodone help her sleep some. Her energy is a little better. She was recently diagnosed with stomach ulcers. Her appetite is poor and she is only eating 2 meals a day. She has poor concentration most days. She rarely feels bad or down on herself. [name] denies SI/HI. She has paranoia and is worried about her ex- husband but is trying not to let it control her. She has some on/off intrusive memories. [name] denies AVH. A few month ago she was hearing some music in her attic.    Observations/Objective:  General Appearance: unable to assess  Eye Contact:  unable to assess  Speech:  Clear and Coherent and Normal Rate  Volume:  Normal  Mood:  Euthymic  Affect:  Full Range  Thought Process:  Goal Directed, Linear and Descriptions of Associations: Intact  Orientation:  Full (Time, Place, and Person)  Thought Content:  Paranoid Ideation  Suicidal Thoughts:  No  Homicidal Thoughts:  No  Memory:  Immediate;   Good  Judgement:   Good  Insight:  Good  Psychomotor Activity: unable to assess  Concentration:  Concentration: Good  Recall:  Good  Fund of Knowledge:  Good  Language:  Good  Akathisia:  unable to assess  Handed:  Right  AIMS (if indicated):     Assets:  Communication Skills Desire for Improvement Financial Resources/Insurance Housing Resilience Social Support Talents/Skills Transportation Vocational/Educational  ADL's:  unable to assess  Cognition:  WNL  Sleep:         Assessment and Plan: Depression screen [facility] 2/9 07/01/2020  Decreased Interest 0  Down, Depressed, Hopeless 0  PHQ - 2 Score 0    Flowsheet Row Video Visit from 07/01/2020 in [facility] from 05/24/2020 in [place] No Risk High Risk       Follow Up Instructions: In 2-3 months or sooner if needed   I discussed the assessment and treatment plan with the patient. The patient was provided an opportunity to ask questions and all were answered. The patient agreed with the plan and demonstrated an understanding of the instructions.   The patient was advised to call back or seek an in-person evaluation if the symptoms worsen or if the condition fails to improve as anticipated.  I provided 17 minutes of non-face-to-face time during this encounter.   [name], MD

## 2020-09-09 ENCOUNTER — Telehealth: Payer: Self-pay | Admitting: Acute Care

## 2020-09-09 NOTE — Telephone Encounter (Signed)
Attempted to call pt. Left message for pt that appt reminder has been mailed to the address that we have her in her chart. Left call back number if pt has any further questions or concerns.

## 2020-09-20 ENCOUNTER — Ambulatory Visit: Payer: Medicare HMO

## 2020-10-05 ENCOUNTER — Telehealth: Payer: Self-pay | Admitting: *Deleted

## 2020-10-05 NOTE — Telephone Encounter (Signed)
Spoke with pt . She was concerned that her insurance would not cover the lung screening because it had not covered some dental work she had done recently. I explained to pt that Medicare pays for the lung screening at 100% and that our [facility] has already contacted her insurance to approve CT. Pt would like to proceed with appt.  [facility] called [facility] back and was able to get original CT appt back. I called and left message for pt that appt was rescheduled for the same time and to call our office for any questions. Nothing further needed at this time.

## 2020-10-06 ENCOUNTER — Encounter: Payer: Self-pay | Admitting: Acute Care

## 2020-10-06 ENCOUNTER — Ambulatory Visit
Admission: RE | Admit: 2020-10-06 | Discharge: 2020-10-06 | Disposition: A | Discharge status: Home / Self Care | Payer: Medicare HMO | Source: Ambulatory Visit | Attending: Acute Care | Admitting: Acute Care

## 2020-10-06 ENCOUNTER — Ambulatory Visit: Payer: Medicare HMO

## 2020-10-06 ENCOUNTER — Other Ambulatory Visit: Payer: Self-pay

## 2020-10-06 ENCOUNTER — Ambulatory Visit (INDEPENDENT_AMBULATORY_CARE_PROVIDER_SITE_OTHER): Payer: Medicare HMO | Admitting: Acute Care

## 2020-10-06 DIAGNOSIS — F17210 Nicotine dependence, cigarettes, uncomplicated: Secondary | ICD-10-CM

## 2020-10-06 DIAGNOSIS — Z87891 Personal history of nicotine dependence: Secondary | ICD-10-CM

## 2020-10-06 NOTE — Progress Notes (Signed)
Virtual Visit via Telephone Note  I connected with [name] on 10/06/20 at 11:30 AM EDT by telephone and verified that I am speaking with the correct person using two identifiers.  Location: Patient: Home Provider: [place], [place], [place], [address]    I discussed the limitations, risks, security and privacy concerns of performing an evaluation and management service by telephone and the availability of in person appointments. I also discussed with the patient that there may be a patient responsible charge related to this service. The patient expressed understanding and agreed to proceed.    Shared Decision Making Visit Lung Cancer Screening Program [phone])   Eligibility: Age 65 y.o. Pack Years Smoking History Calculation 38 pack year smoking history (# packs/per year x # years smoked) Recent History of coughing up blood  no Unexplained weight loss? no ( >Than 15 pounds within the last 6 months ) Prior History Lung / other cancer no (Diagnosis within the last 5 years already requiring surveillance chest CT Scans). Smoking Status Current Smoker Former Smokers: Years since quit: NA  Quit Date: NA  Visit Components: Discussion included one or more decision making aids. yes Discussion included risk/benefits of screening. yes Discussion included potential follow up diagnostic testing for abnormal scans. yes Discussion included meaning and risk of over diagnosis. yes Discussion included meaning and risk of False Positives. yes Discussion included meaning of total radiation exposure. yes  Counseling Included: Importance of adherence to annual lung cancer LDCT screening. yes Impact of comorbidities on ability to participate in the program. yes Ability and willingness to under diagnostic treatment. yes  Smoking Cessation Counseling: Current Smokers:  Discussed importance of smoking cessation. yes Information about tobacco cessation classes and interventions  provided to patient. yes Patient provided with "ticket" for LDCT Scan. yes Symptomatic Patient. no  Counseling Diagnosis Code: Tobacco Use Z72.0 Asymptomatic Patient yes  Counseling (Intermediate counseling: > three minutes counseling) U0454 Former Smokers:  Discussed the importance of maintaining cigarette abstinence. yes Diagnosis Code: Personal History of Nicotine Dependence. U98.119 Information about tobacco cessation classes and interventions provided to patient. Yes Patient provided with "ticket" for LDCT Scan. yes Written Order for Lung Cancer Screening with LDCT placed in Epic. Yes (CT Chest Lung Cancer Screening Low Dose W/O CM) [id number] Z12.2-Screening of respiratory organs Z87.891-Personal history of nicotine dependence  Pt. Has been smoke free x 3 weeks. I congratulated her on this, and have asked her to let us know of we can help in amy way.   I have spent 25 minutes of face to face time with [name] discussing the risks and benefits of lung cancer screening. We viewed a power point together that explained in detail the above noted topics. We paused at intervals to allow for questions to be asked and answered to ensure understanding.We discussed that the single most powerful action that she can take to decrease her risk of developing lung cancer is to quit smoking. We discussed whether or not she is ready to commit to setting a quit date. We discussed options for tools to aid in quitting smoking including nicotine replacement therapy, non-nicotine medications, support groups, Quit Smart classes, and behavior modification. We discussed that often times setting smaller, more achievable goals, such as eliminating 1 cigarette a day for a week and then 2 cigarettes a day for a week can be helpful in slowly decreasing the number of cigarettes smoked. This allows for a sense of accomplishment as well as providing a clinical benefit.  I gave her the " Be Stronger Than Your Excuses" card  with contact information for community resources, classes, free nicotine replacement therapy, and access to mobile apps, text messaging, and on-line smoking cessation help. I have also given her my card and contact information in the event she needs to contact me. We discussed the time and location of the scan, and that either [name] RN or I will call with the results within 24-48 hours of receiving them. I have offered her  a copy of the power point we viewed  as a resource in the event they need reinforcement of the concepts we discussed today in the office. The patient verbalized understanding of all of  the above and had no further questions upon leaving the office. They have my contact information in the event they have any further questions.  I spent 4 minutes counseling on smoking cessation and the health risks of continued tobacco abuse.  I explained to the patient that there has been a high incidence of coronary artery disease noted on these exams. I explained that this is a non-gated exam therefore degree or severity cannot be determined. This patient is not on statin therapy. I have asked the patient to follow-up with their PCP regarding any incidental finding of coronary artery disease and management with diet or medication as their PCP  feels is clinically indicated. The patient verbalized understanding of the above and had no further questions upon completion of the visit.      Joyce Spatz, NP 10/06/2020

## 2020-10-06 NOTE — Patient Instructions (Signed)
Thank you for participating in the [facility] Lung Cancer Screening Program. It was our pleasure to meet you today. We will call you with the results of your scan within the next few days. Your scan will be assigned a Lung RADS category score by the physicians reading the scans.  This Lung RADS score determines follow up scanning.  See below for description of categories, and follow up screening recommendations. We will be in touch to schedule your follow up screening annually or based on recommendations of our providers. We will fax a copy of your scan results to your Primary Care Physician, or the physician who referred you to the program, to ensure they have the results. Please call the office if you have any questions or concerns regarding your scanning experience or results.  Our office number is [phone]. Please speak with Denise Phelps, RN. She is our Lung Cancer Screening RN. If she is unavailable when you call, please have the office staff send her a message. She will return your call at her earliest convenience. Remember, if your scan is normal, we will scan you annually as long as you continue to meet the criteria for the program. (Age 65-77, Current smoker or smoker who has quit within the last 15 years). If you are a smoker, remember, quitting is the single most powerful action that you can take to decrease your risk of lung cancer and other pulmonary, breathing related problems. We know quitting is hard, and we are here to help.  Please let us know if there is anything we can do to help you meet your goal of quitting. If you are a former smoker, congratulations. We are proud of you! Remain smoke free! Remember you can refer friends or family members through the number above.  We will screen them to make sure they meet criteria for the program. Thank you for helping us take better care of you by participating in Lung Screening.  Lung RADS Categories:  Lung RADS 1: no nodules  or definitely non-concerning nodules.  Recommendation is for a repeat annual scan in 12 months.  Lung RADS 2:  nodules that are non-concerning in appearance and behavior with a very low likelihood of becoming an active cancer. Recommendation is for a repeat annual scan in 12 months.  Lung RADS 3: nodules that are probably non-concerning , includes nodules with a low likelihood of becoming an active cancer.  Recommendation is for a 6-month repeat screening scan. Often noted after an upper respiratory illness. We will be in touch to make sure you have no questions, and to schedule your 6-month scan.  Lung RADS 4 A: nodules with concerning findings, recommendation is most often for a follow up scan in 3 months or additional testing based on our provider's assessment of the scan. We will be in touch to make sure you have no questions and to schedule the recommended 3 month follow up scan.  Lung RADS 4 B:  indicates findings that are concerning. We will be in touch with you to schedule additional diagnostic testing based on our provider's  assessment of the scan.   

## 2020-10-08 NOTE — Progress Notes (Signed)
Please call patient and let them  know their  low dose Ct was read as a Lung RADS 2: nodules that are benign in appearance and behavior with a very low likelihood of becoming a clinically active cancer due to size or lack of growth. Recommendation per radiology is for a repeat LDCT in 12 months. .Please let them  know we will order and schedule their  annual screening scan for 09/2021. Please let them  know there was notation of CAD on their  scan.  Please remind the patient  that this is a non-gated exam therefore degree or severity of disease  cannot be determined. Please have them  follow up with their PCP regarding potential risk factor modification, dietary therapy or pharmacologic therapy if clinically indicated. Pt.  is not  currently on statin therapy. Please place order for annual  screening scan for  09/2021 and fax results to PCP. Thanks so much.  Denise.CAD and no statin in Epic. Please have her follow up with her PCP. Thanks so much

## 2020-10-13 ENCOUNTER — Telehealth: Payer: Self-pay | Admitting: Acute Care

## 2020-10-13 NOTE — Telephone Encounter (Signed)
LMTCB  Ct faxed to MD per patient request.

## 2020-10-14 NOTE — Telephone Encounter (Signed)
Called and left a detailed message that pt's CT results were faxed to PCP office and to call back if anything further is needed.

## 2020-10-25 ENCOUNTER — Other Ambulatory Visit: Payer: Self-pay | Admitting: *Deleted

## 2020-10-25 DIAGNOSIS — F17210 Nicotine dependence, cigarettes, uncomplicated: Secondary | ICD-10-CM

## 2020-10-25 DIAGNOSIS — Z87891 Personal history of nicotine dependence: Secondary | ICD-10-CM

## 2020-10-28 ENCOUNTER — Other Ambulatory Visit (HOSPITAL_COMMUNITY): Payer: Self-pay | Admitting: *Deleted

## 2020-10-28 ENCOUNTER — Telehealth (HOSPITAL_COMMUNITY): Payer: Self-pay | Admitting: *Deleted

## 2020-10-28 DIAGNOSIS — F4310 Post-traumatic stress disorder, unspecified: Secondary | ICD-10-CM

## 2020-10-28 DIAGNOSIS — F251 Schizoaffective disorder, depressive type: Secondary | ICD-10-CM

## 2020-10-28 DIAGNOSIS — F5102 Adjustment insomnia: Secondary | ICD-10-CM

## 2020-10-28 NOTE — Telephone Encounter (Signed)
Pt called requesting all Rx's be transferred to [facility] as she has [employer] now and no longer uses [employer]. Pt stated that [facility] will not transfer scripts. Pt has an appointment with Dr. Doyne Keel coming up on 11/18/20 however as [facility] is mail order, insurance will only pay for 90 day supply. Ok to refill for 90 days?

## 2020-11-01 NOTE — Telephone Encounter (Signed)
Please contact Dr Doyne Keel, if she is ok than I can send. Thanks

## 2020-11-03 ENCOUNTER — Telehealth (HOSPITAL_COMMUNITY): Payer: Self-pay | Admitting: *Deleted

## 2020-11-03 NOTE — Telephone Encounter (Signed)
Pt has called requesting medications be transferred to [facility] and would need a 90 day supply on all. Ok to fill?

## 2020-11-04 ENCOUNTER — Other Ambulatory Visit (HOSPITAL_COMMUNITY): Payer: Self-pay | Admitting: *Deleted

## 2020-11-04 DIAGNOSIS — F251 Schizoaffective disorder, depressive type: Secondary | ICD-10-CM

## 2020-11-04 DIAGNOSIS — F5102 Adjustment insomnia: Secondary | ICD-10-CM

## 2020-11-04 DIAGNOSIS — F4310 Post-traumatic stress disorder, unspecified: Secondary | ICD-10-CM

## 2020-11-04 MED ORDER — ZIPRASIDONE HCL 80 MG PO CAPS: 80.0000 mg | ORAL_CAPSULE | Freq: Two times a day (BID) | ORAL | 0 refills | Status: DC

## 2020-11-04 MED ORDER — TRAZODONE HCL 100 MG PO TABS: 100.0000 mg | ORAL_TABLET | Freq: Every evening | ORAL | 0 refills | Status: DC | PRN

## 2020-11-04 MED ORDER — BENZTROPINE MESYLATE 1 MG PO TABS: 1.0000 mg | ORAL_TABLET | Freq: Every day | ORAL | 0 refills | Status: DC

## 2020-11-04 MED ORDER — SERTRALINE HCL 100 MG PO TABS: 100.0000 mg | ORAL_TABLET | Freq: Every day | ORAL | 0 refills | Status: DC

## 2020-11-18 ENCOUNTER — Telehealth (HOSPITAL_COMMUNITY): Payer: Self-pay | Admitting: Psychiatry

## 2020-11-18 ENCOUNTER — Other Ambulatory Visit: Payer: Self-pay

## 2020-11-18 NOTE — Telephone Encounter (Signed)
I called the patient at our scheduled appointment time. There was no answer. I left a voice message for patient to call the clinic back at their convinence.   

## 2020-11-25 ENCOUNTER — Other Ambulatory Visit: Payer: Self-pay

## 2020-11-25 ENCOUNTER — Telehealth (INDEPENDENT_AMBULATORY_CARE_PROVIDER_SITE_OTHER): Payer: Medicare (Managed Care) | Admitting: Psychiatry

## 2020-11-25 DIAGNOSIS — F4310 Post-traumatic stress disorder, unspecified: Secondary | ICD-10-CM | POA: Diagnosis not present

## 2020-11-25 DIAGNOSIS — F5102 Adjustment insomnia: Secondary | ICD-10-CM | POA: Diagnosis not present

## 2020-11-25 DIAGNOSIS — F251 Schizoaffective disorder, depressive type: Secondary | ICD-10-CM | POA: Diagnosis not present

## 2020-11-25 MED ORDER — BENZTROPINE MESYLATE 1 MG PO TABS: 1.0000 mg | ORAL_TABLET | Freq: Every day | ORAL | 0 refills | Status: DC

## 2020-11-25 MED ORDER — TRAZODONE HCL 150 MG PO TABS: 150.0000 mg | ORAL_TABLET | Freq: Every evening | ORAL | 0 refills | Status: DC | PRN

## 2020-11-25 MED ORDER — SERTRALINE HCL 100 MG PO TABS: 150.0000 mg | ORAL_TABLET | Freq: Every day | ORAL | 0 refills | Status: DC

## 2020-11-25 MED ORDER — ZIPRASIDONE HCL 80 MG PO CAPS: 80.0000 mg | ORAL_CAPSULE | Freq: Two times a day (BID) | ORAL | 0 refills | Status: DC

## 2020-11-25 NOTE — Progress Notes (Signed)
Virtual Visit via Telephone Note  I connected with [name] on 11/25/20 at 11:00 AM EDT by telephone and verified that I am speaking with the correct person using two identifiers.Pt is not able to set up video today so we opted to continue by phone.   Location: Patient: home Provider: office   I discussed the limitations, risks, security and privacy concerns of performing an evaluation and management service by telephone and the availability of in person appointments. I also discussed with the patient that there may be a patient responsible charge related to this service. The patient expressed understanding and agreed to proceed.   History of Present Illness: [name] shares that she is feeling depressed again for the last few weeks. She suffers from depression daily and the level is 3/10 (10 being the worst). She is endorsing low motivation and low energy. She is having isolation and anhedonia. [name] was given a different brand of Geodon and wonders if that is the problem. She is not sleeping well at night so she naps in the morning. The Trazodone helps for a few hours. Her appetite is increased. Her concentration is poor. She denies negative self thoughts and denies passive thoughts of death. She denies SI/HI. She denies AVH. Her paranoia is increased. It is sporadic and it is usually triggered by thoughts of her husband. She is dealing with nightmares and hypervigilance.    Observations/Objective:  General Appearance: unable to assess  Eye Contact:  unable to assess  Speech:  Clear and Coherent and Normal Rate  Volume:  Normal  Mood:  Depressed  Affect:  Congruent  Thought Process:  Goal Directed, Linear, and Descriptions of Associations: Intact  Orientation:  Full (Time, Place, and Person)  Thought Content:  Paranoid Ideation  Suicidal Thoughts:  No  Homicidal Thoughts:  No  Memory:  Immediate;   Good  Judgement:  Good  Insight:  Good  Psychomotor Activity: unable to assess   Concentration:  Concentration: Good  Recall:  Good  Fund of Knowledge:  Good  Language:  Good  Akathisia:  unable to assess  Handed:  unable to assess  AIMS (if indicated):     Assets:  Communication Skills Desire for Improvement Financial Resources/Insurance Housing Resilience Vocational/Educational  ADL's:  unable to assess  Cognition:  WNL  Sleep:        Assessment and Plan:  1. Schizoaffective disorder, depressive type (HCC) - benztropine (COGENTIN) 1 MG tablet; Take 1 tablet (1 mg total) by mouth daily.  Dispense: 90 tablet; Refill: 0 - sertraline (ZOLOFT) 100 MG tablet; Take 1.5 tablets (150 mg total) by mouth daily.  Dispense: 135 tablet; Refill: 0 - traZODone (DESYREL) 150 MG tablet; Take 1 tablet (150 mg total) by mouth at bedtime as needed for sleep.  Dispense: 90 tablet; Refill: 0 - ziprasidone (GEODON) 80 MG capsule; Take 1 capsule (80 mg total) by mouth in the morning and at bedtime.  Dispense: 180 capsule; Refill: 0  2. PTSD (post-traumatic stress disorder) - sertraline (ZOLOFT) 100 MG tablet; Take 1.5 tablets (150 mg total) by mouth daily.  Dispense: 135 tablet; Refill: 0 - traZODone (DESYREL) 150 MG tablet; Take 1 tablet (150 mg total) by mouth at bedtime as needed for sleep.  Dispense: 90 tablet; Refill: 0  3. Adjustment insomnia - traZODone (DESYREL) 150 MG tablet; Take 1 tablet (150 mg total) by mouth at bedtime as needed for sleep.  Dispense: 90 tablet; Refill: 0   - increase Trazodone for sleep -  increase Zoloft to target depression Follow Up Instructions: In 2-3 months or sooner if needed   I discussed the assessment and treatment plan with the patient. The patient was provided an opportunity to ask questions and all were answered. The patient agreed with the plan and demonstrated an understanding of the instructions.   The patient was advised to call back or seek an in-person evaluation if the symptoms worsen or if the condition fails to improve as  anticipated.  I provided 23 minutes of non-face-to-face time during this encounter.   [name], MD

## 2021-01-05 ENCOUNTER — Other Ambulatory Visit (HOSPITAL_COMMUNITY): Payer: Self-pay | Admitting: Psychiatry

## 2021-01-05 DIAGNOSIS — F251 Schizoaffective disorder, depressive type: Secondary | ICD-10-CM

## 2021-01-05 DIAGNOSIS — F4310 Post-traumatic stress disorder, unspecified: Secondary | ICD-10-CM

## 2021-01-05 DIAGNOSIS — F5102 Adjustment insomnia: Secondary | ICD-10-CM

## 2021-01-13 ENCOUNTER — Telehealth (HOSPITAL_COMMUNITY): Payer: Medicare (Managed Care) | Admitting: Psychiatry

## 2021-01-27 ENCOUNTER — Telehealth (HOSPITAL_BASED_OUTPATIENT_CLINIC_OR_DEPARTMENT_OTHER): Payer: Medicare (Managed Care) | Admitting: Psychiatry

## 2021-01-27 ENCOUNTER — Other Ambulatory Visit: Payer: Self-pay

## 2021-01-27 DIAGNOSIS — F5102 Adjustment insomnia: Secondary | ICD-10-CM | POA: Diagnosis not present

## 2021-01-27 DIAGNOSIS — F251 Schizoaffective disorder, depressive type: Secondary | ICD-10-CM | POA: Diagnosis not present

## 2021-01-27 DIAGNOSIS — F4310 Post-traumatic stress disorder, unspecified: Secondary | ICD-10-CM

## 2021-01-27 MED ORDER — ZIPRASIDONE HCL 80 MG PO CAPS: 80.0000 mg | ORAL_CAPSULE | Freq: Two times a day (BID) | ORAL | 0 refills | Status: DC

## 2021-01-27 MED ORDER — TRAZODONE HCL 150 MG PO TABS: 150.0000 mg | ORAL_TABLET | Freq: Every evening | ORAL | 0 refills | Status: DC | PRN

## 2021-01-27 MED ORDER — VILAZODONE HCL 10 MG PO TABS: 10.0000 mg | ORAL_TABLET | Freq: Every day | ORAL | 0 refills | Status: DC

## 2021-01-27 MED ORDER — BENZTROPINE MESYLATE 1 MG PO TABS: 1.0000 mg | ORAL_TABLET | Freq: Every day | ORAL | 0 refills | Status: DC

## 2021-01-27 NOTE — Progress Notes (Signed)
Virtual Visit via Telephone Note  I connected with [name] on 01/27/21 at  3:00 PM EDT by telephone and verified that I am speaking with the correct person using two identifiers.  Location: Patient: home Provider: office   I discussed the limitations, risks, security and privacy concerns of performing an evaluation and management service by telephone and the availability of in person appointments. I also discussed with the patient that there may be a patient responsible charge related to this service. The patient expressed understanding and agreed to proceed.   History of Present Illness: [name] shares that she is not doing well. She is waking up migraines and that usually happens when she forgets to take her meds. Since the middle of August she has been taking her meds daily as prescribed and her depression is not improving. The change in She remains depressed near daily and the level remains 3/10 (10 being the worst). [name] is endorsing insomnia and nightmares about 3 nights/week. She is reporting isolation and anhedonia. Her motivation and energy are low. Her appetite is poor and she is eating about 1-2 small meals/day. She denies SI/HI. She continues to experience paranoia, HV and intrusive memories randomly.    Observations/Objective:  General Appearance: unable to assess  Eye Contact:  unable to assess  Speech:  Clear and Coherent and Normal Rate  Volume:  Normal  Mood:  Depressed  Affect:  Congruent  Thought Process:  Goal Directed, Linear, and Descriptions of Associations: Intact  Orientation:  Full (Time, Place, and Person)  Thought Content:  Paranoid Ideation  Suicidal Thoughts:  No  Homicidal Thoughts:  No  Memory:  Immediate;   Good  Judgement:  Good  Insight:  Good  Psychomotor Activity: unable to assess  Concentration:  Concentration: Good  Recall:  Good  Fund of Knowledge:  Good  Language:  Good  Akathisia:  unable to assess  Handed:  unable to assess  AIMS  (if indicated):     Assets:  Communication Skills Desire for Improvement Financial Resources/Insurance Housing Resilience Social Support Talents/Skills Transportation Vocational/Educational  ADL's:  unable to assess  Cognition:  WNL  Sleep:        Assessment and Plan: Depression screen [facility] 2/9 01/27/2021 11/25/2020 08/26/2020 07/01/2020  Decreased Interest 3 3 2  0  Down, Depressed, Hopeless 3 3 2  0  PHQ - 2 Score 6 6 4  0  Altered sleeping 1 3 1  -  Tired, decreased energy 1 3 1  -  Change in appetite 3 3 3  -  Feeling bad or failure about yourself  0 0 1 -  Trouble concentrating 1 3 3  -  Moving slowly or fidgety/restless 0 0 0 -  Suicidal thoughts 0 0 0 -  PHQ-9 Score 12 18 13  -  Difficult doing work/chores Somewhat difficult Somewhat difficult Very difficult -    Flowsheet Row Video Visit from 01/27/2021 in [facility] Video Visit from 11/25/2020 in [facility] Video Visit from 08/26/2020 in [facility]  C-SSRS RISK CATEGORY No Risk No Risk No Risk        D/c Zoloft with slow taper- 100mg  qD x5 days then 50mg  po qD for 5 days then stop  Start Viibryd 10mg  po qD  1. Schizoaffective disorder, depressive type (HCC) - Vilazodone HCl (VIIBRYD) 10 MG TABS; Take 1 tablet (10 mg total) by mouth daily.  Dispense: 30 tablet; Refill: 0 - benztropine (COGENTIN) 1 MG tablet; Take 1  tablet (1 mg total) by mouth daily.  Dispense: 90 tablet; Refill: 0 - traZODone (DESYREL) 150 MG tablet; Take 1 tablet (150 mg total) by mouth at bedtime as needed for sleep.  Dispense: 90 tablet; Refill: 0 - ziprasidone (GEODON) 80 MG capsule; Take 1 capsule (80 mg total) by mouth in the morning and at bedtime.  Dispense: 180 capsule; Refill: 0  2. PTSD (post-traumatic stress disorder) - traZODone (DESYREL) 150 MG tablet; Take 1 tablet (150 mg total) by mouth at bedtime as needed for sleep.   Dispense: 90 tablet; Refill: 0  3. Adjustment insomnia - traZODone (DESYREL) 150 MG tablet; Take 1 tablet (150 mg total) by mouth at bedtime as needed for sleep.  Dispense: 90 tablet; Refill: 0   Follow Up Instructions: In 2-4 weeks or sooner if needed   I discussed the assessment and treatment plan with the patient. The patient was provided an opportunity to ask questions and all were answered. The patient agreed with the plan and demonstrated an understanding of the instructions.   The patient was advised to call back or seek an in-person evaluation if the symptoms worsen or if the condition fails to improve as anticipated.  I provided 19 minutes of non-face-to-face time during this encounter.   [name], MD

## 2021-02-10 ENCOUNTER — Other Ambulatory Visit: Payer: Self-pay

## 2021-02-10 ENCOUNTER — Telehealth (HOSPITAL_BASED_OUTPATIENT_CLINIC_OR_DEPARTMENT_OTHER): Payer: Medicare (Managed Care) | Admitting: Psychiatry

## 2021-02-10 DIAGNOSIS — F251 Schizoaffective disorder, depressive type: Secondary | ICD-10-CM

## 2021-02-10 MED ORDER — VILAZODONE HCL 10 MG PO TABS: 10.0000 mg | ORAL_TABLET | Freq: Every day | ORAL | 0 refills | Status: DC

## 2021-02-10 NOTE — Progress Notes (Signed)
Virtual Visit via Telephone Note  I connected with [name] on 02/10/21 at 10:45 AM EDT by telephone and verified that I am speaking with the correct person using two identifiers. We were unable to connect by video.   Location: Patient: home Provider: office   I discussed the limitations, risks, security and privacy concerns of performing an evaluation and management service by telephone and the availability of in person appointments. I also discussed with the patient that there may be a patient responsible charge related to this service. The patient expressed understanding and agreed to proceed.   History of Present Illness: "I am doing better". The Viibryd helping. She noticed a positive effect within days of starting it. She denies any side effects and wants to continue it.  "I don't feel the world is coming to end right here and right now". Her depression is improving and she has been more energetic and motivated. She has only felt depressed 1 day earlier this week due to stressors. Her sleep remains variable and 2-3 days/week she is not able to sleep. Appetite and concentration are fair. She denies isolation and anhedonia, Her PTSD is much better. She rarely thinks about her dad anymore. [name] is in pain when she walks to due pain in her lower legs. She denies SI/HI. She has rare AH that occurring randomly a few times a month. She hears one voice that she has a regular conversation with sometimes. She didn't realize it was an AH until she looked around. She denies VH. Her paranoia regarding her ex-husband remains unchanged and she is no longer sitting outside.    Observations/Objective:  General Appearance: unable to assess  Eye Contact:  unable to assess  Speech:  Clear and Coherent and Normal Rate  Volume:  Normal  Mood:  Euthymic  Affect:  Full Range  Thought Process:  Goal Directed, Linear, and Descriptions of Associations: Intact  Orientation:  Full (Time, Place, and Person)   Thought Content:  Paranoid Ideation  Suicidal Thoughts:  No  Homicidal Thoughts:  No  Memory:  Immediate;   Good  Judgement:  Good  Insight:  Good  Psychomotor Activity: unable to assess  Concentration:  Concentration: Good  Recall:  Good  Fund of Knowledge:  Good  Language:  Good  Akathisia:  unable to assess  Handed:  unable to assess  AIMS (if indicated):     Assets:  Communication Skills Desire for Improvement Financial Resources/Insurance Housing Resilience Social Support Talents/Skills Transportation Vocational/Educational  ADL's:  unable to assess  Cognition:  WNL  Sleep:        Assessment and Plan: Depression screen [facility] 2/9 02/10/2021 01/27/2021 11/25/2020 08/26/2020 07/01/2020  Decreased Interest 0 3 3 2  0  Down, Depressed, Hopeless 0 3 3 2  0  PHQ - 2 Score 0 6 6 4  0  Altered sleeping 1 1 3 1  -  Tired, decreased energy 0 1 3 1  -  Change in appetite 0 3 3 3  -  Feeling bad or failure about yourself  0 0 0 1 -  Trouble concentrating 0 1 3 3  -  Moving slowly or fidgety/restless 0 0 0 0 -  Suicidal thoughts 0 0 0 0 -  PHQ-9 Score 1 12 18 13  -  Difficult doing work/chores Somewhat difficult Somewhat difficult Somewhat difficult Very difficult -    Flowsheet Row Video Visit from 02/10/2021 in [facility] Video Visit from 01/27/2021 in [facility] Video Visit  from 11/25/2020 in [place] No Risk No Risk No Risk       1. Schizoaffective disorder, depressive type (HCC) - Vilazodone HCl (VIIBRYD) 10 MG TABS; Take 1 tablet (10 mg total) by mouth daily.  Dispense: 90 tablet; Refill: 0  - continue Cogentin, Geodon, Trazodone  Follow Up Instructions: In 2-3 months or sooner if needed   I discussed the assessment and treatment plan with the patient. The patient was provided an opportunity to ask questions and all were answered.  The patient agreed with the plan and demonstrated an understanding of the instructions.   The patient was advised to call back or seek an in-person evaluation if the symptoms worsen or if the condition fails to improve as anticipated.  I provided 14 minutes of non-face-to-face time during this encounter.   [name], MD

## 2021-03-24 ENCOUNTER — Telehealth (HOSPITAL_COMMUNITY): Payer: Self-pay | Admitting: *Deleted

## 2021-03-24 NOTE — Telephone Encounter (Signed)
Received call from pt with an FYI that she has d/c'd the Viibryd and has returned to taking Zoloft. Pt says she feels Viibryd not helpful for her depression. Pt will f/u on scheduled appointment on 04/21/21.

## 2021-04-12 ENCOUNTER — Telehealth (HOSPITAL_COMMUNITY): Payer: Self-pay

## 2021-04-12 NOTE — Telephone Encounter (Signed)
Patient called and stated that she was prescribed a different antidepressant and stated that it's not working anymore. Patient stated that she went back on Sertraline and needs a refill. Please review and advise. Thank you

## 2021-04-15 NOTE — Telephone Encounter (Signed)
Relayed message/Notified patient

## 2021-04-21 ENCOUNTER — Telehealth (HOSPITAL_COMMUNITY): Payer: Medicare (Managed Care) | Admitting: Psychiatry

## 2021-04-21 ENCOUNTER — Telehealth (HOSPITAL_BASED_OUTPATIENT_CLINIC_OR_DEPARTMENT_OTHER): Payer: Medicaid Other | Admitting: Psychiatry

## 2021-04-21 ENCOUNTER — Other Ambulatory Visit: Payer: Self-pay

## 2021-04-21 DIAGNOSIS — F4310 Post-traumatic stress disorder, unspecified: Secondary | ICD-10-CM | POA: Diagnosis not present

## 2021-04-21 DIAGNOSIS — F5102 Adjustment insomnia: Secondary | ICD-10-CM

## 2021-04-21 DIAGNOSIS — F251 Schizoaffective disorder, depressive type: Secondary | ICD-10-CM

## 2021-04-21 MED ORDER — ZIPRASIDONE HCL 80 MG PO CAPS: 80.0000 mg | ORAL_CAPSULE | Freq: Two times a day (BID) | ORAL | 0 refills | Status: DC

## 2021-04-21 MED ORDER — BENZTROPINE MESYLATE 1 MG PO TABS: 1.0000 mg | ORAL_TABLET | Freq: Every day | ORAL | 0 refills | Status: DC

## 2021-04-21 MED ORDER — SERTRALINE HCL 100 MG PO TABS: 200.0000 mg | ORAL_TABLET | Freq: Every day | ORAL | 0 refills | Status: DC

## 2021-04-21 MED ORDER — TRAZODONE HCL 100 MG PO TABS: 200.0000 mg | ORAL_TABLET | Freq: Every evening | ORAL | 0 refills | Status: DC | PRN

## 2021-04-21 NOTE — Progress Notes (Signed)
Virtual Visit via Telephone Note  I connected with [name] on 04/21/21 at  3:30 PM EST by telephone and verified that I am speaking with the correct person using two identifiers.  Location: Patient: home Provider: office   I discussed the limitations, risks, security and privacy concerns of performing an evaluation and management service by telephone and the availability of in person appointments. I also discussed with the patient that there may be a patient responsible charge related to this service. The patient expressed understanding and agreed to proceed.   History of Present Illness: [name] shares she has got off her prescribed diet and is feeling sick. She is having extreme fatigue. The Viibryd was helping a lot for a few weeks but then she noticed that her depression symptoms were worsening. Her motivation is low and doesn't want to do anything. She has been feeling like she did before she started Zoloft. She has been feeling sad despite decreasing some of her financial stressors. [name] is having some crying spells and on/off hopelessness. Her sleep is poor most nights even with Trazodone. Her appetite is poor and she is not eating more than 1 meal/day. Her concentration is poor. She denies SI/HI. [name] continues to have [place] [employer] music program. She denies any voices. She denies visual hallucinations and paranoia. [name] feels safer lately than ever before.    Observations/Objective:  General Appearance: unable to assess  Eye Contact:  unable to assess  Speech:  Clear and Coherent and Slow  Volume:  Normal  Mood:  Anxious and Depressed  Affect:  Congruent  Thought Process:  Coherent and Descriptions of Associations: Circumstantial  Orientation:  Full (Time, Place, and Person)  Thought Content:  Hallucinations: Auditory  Suicidal Thoughts:  No  Homicidal Thoughts:  No  Memory:  Immediate;   Good  Judgement:  Fair  Insight:  Good  Psychomotor Activity: unable to assess   Concentration:  Concentration: Good  Recall:  Good  Fund of Knowledge:  Good  Language:  Good  Akathisia:  unable to assess  Handed:  unable to assess  AIMS (if indicated):     Assets:  Communication Skills Desire for Improvement Financial Resources/Insurance Housing Resilience Social Support Talents/Skills Transportation Vocational/Educational  ADL's:  unable to assess  Cognition:  WNL  Sleep:        Assessment and Plan: Depression screen [facility] 2/9 04/21/2021 02/10/2021 01/27/2021 11/25/2020 08/26/2020  Decreased Interest 3 0 3 3 2   Down, Depressed, Hopeless 3 0 3 3 2   PHQ - 2 Score 6 0 6 6 4   Altered sleeping 3 1 1 3 1   Tired, decreased energy 3 0 1 3 1   Change in appetite 3 0 3 3 3   Feeling bad or failure about yourself  2 0 0 0 1  Trouble concentrating 2 0 1 3 3   Moving slowly or fidgety/restless 0 0 0 0 0  Suicidal thoughts 0 0 0 0 0  PHQ-9 Score 19 1 12 18 13   Difficult doing work/chores Very difficult Somewhat difficult Somewhat difficult Somewhat difficult Very difficult    Flowsheet Row Video Visit from 04/21/2021 in [facility] Video Visit from 02/10/2021 in [facility] Video Visit from 01/27/2021 in [facility]  C-SSRS RISK CATEGORY No Risk No Risk No Risk      D/c Viibryd  Increase Zoloft 200mg   po qD. She has been taking 100mg  for 10 days. She was on 150mg   previously.   Increase Trazodone 200mg  po qHS for insomnia  1. Schizoaffective disorder, depressive type (HCC) - benztropine (COGENTIN) 1 MG tablet; Take 1 tablet (1 mg total) by mouth daily.  Dispense: 90 tablet; Refill: 0 - traZODone (DESYREL) 100 MG tablet; Take 2 tablets (200 mg total) by mouth at bedtime as needed for sleep.  Dispense: 180 tablet; Refill: 0 - ziprasidone (GEODON) 80 MG capsule; Take 1 capsule (80 mg total) by mouth in the morning and at bedtime.  Dispense: 180  capsule; Refill: 0 - sertraline (ZOLOFT) 100 MG tablet; Take 2 tablets (200 mg total) by mouth daily.  Dispense: 180 tablet; Refill: 0  2. PTSD (post-traumatic stress disorder) - traZODone (DESYREL) 100 MG tablet; Take 2 tablets (200 mg total) by mouth at bedtime as needed for sleep.  Dispense: 180 tablet; Refill: 0 - sertraline (ZOLOFT) 100 MG tablet; Take 2 tablets (200 mg total) by mouth daily.  Dispense: 180 tablet; Refill: 0  3. Adjustment insomnia - traZODone (DESYREL) 100 MG tablet; Take 2 tablets (200 mg total) by mouth at bedtime as needed for sleep.  Dispense: 180 tablet; Refill: 0    Follow Up Instructions: In 1-3 weeks or sooner if needed   I discussed the assessment and treatment plan with the patient. The patient was provided an opportunity to ask questions and all were answered. The patient agreed with the plan and demonstrated an understanding of the instructions.   The patient was advised to call back or seek an in-person evaluation if the symptoms worsen or if the condition fails to improve as anticipated.  I provided 17 minutes of non-face-to-face time during this encounter.   [name], MD

## 2021-04-28 ENCOUNTER — Telehealth (HOSPITAL_BASED_OUTPATIENT_CLINIC_OR_DEPARTMENT_OTHER): Payer: Medicaid Other | Admitting: Psychiatry

## 2021-04-28 ENCOUNTER — Other Ambulatory Visit: Payer: Self-pay

## 2021-04-28 DIAGNOSIS — F251 Schizoaffective disorder, depressive type: Secondary | ICD-10-CM

## 2021-04-28 DIAGNOSIS — F5102 Adjustment insomnia: Secondary | ICD-10-CM | POA: Diagnosis not present

## 2021-04-28 NOTE — Progress Notes (Signed)
Virtual Visit via Telephone Note  I connected with [name] on 04/28/21 at  9:30 AM EST by telephone and verified that I am speaking with the correct person using two identifiers.  Location: Patient: home Provider: office   I discussed the limitations, risks, security and privacy concerns of performing an evaluation and management service by telephone and the availability of in person appointments. I also discussed with the patient that there may be a patient responsible charge related to this service. The patient expressed understanding and agreed to proceed.   History of Present Illness: This is a 1 week follow up after increasing the dose of Zoloft and Trazodone. She is now sleeping 8 hrs/night. The sleep is broken but she is able to fall back asleep quickly. She does not nap during the day but her energy is low. Her depression is improving and 4 days ago it lifted. She has not felt depressed since last week. Her motivation to do chores around the house is low because she she is still recovering from being sick. She is no longer feeling hopeless. Her anhedonia is improving. She is no longer getting down on herself. She denies SI/HI. She denies AVH, paranoia and ideas of reference.    Observations/Objective:  General Appearance: unable to assess  Eye Contact:  unable to assess  Speech:  Clear and Coherent and Slow  Volume:  Normal  Mood:  Euthymic  Affect:  Full Range  Thought Process:  Coherent and Descriptions of Associations: Circumstantial  Orientation:  Full (Time, Place, and Person)  Thought Content:  Logical  Suicidal Thoughts:  No  Homicidal Thoughts:  No  Memory:  Immediate;   Good  Judgement:  Good  Insight:  Good  Psychomotor Activity: unable to assess  Concentration:  Concentration: Good  Recall:  Good  Fund of Knowledge:  Good  Language:  Good  Akathisia:  unable to assess  Handed:  unable to assess  AIMS (if indicated):     Assets:  Communication  Skills Desire for Improvement Financial Resources/Insurance Housing Resilience Social Support Talents/Skills Transportation Vocational/Educational  ADL's:  unable to assess  Cognition:  WNL  Sleep:        Assessment and Plan: Depression screen [facility] 2/9 04/28/2021 04/21/2021 02/10/2021 01/27/2021 11/25/2020  Decreased Interest 1 3 0 3 3  Down, Depressed, Hopeless 1 3 0 3 3  PHQ - 2 Score 2 6 0 6 6  Altered sleeping 0 3 1 1 3   Tired, decreased energy 3 3 0 1 3  Change in appetite 3 3 0 3 3  Feeling bad or failure about yourself  1 2 0 0 0  Trouble concentrating 2 2 0 1 3  Moving slowly or fidgety/restless 0 0 0 0 0  Suicidal thoughts 0 0 0 0 0  PHQ-9 Score 11 19 1 12 18   Difficult doing work/chores Somewhat difficult Very difficult Somewhat difficult Somewhat difficult Somewhat difficult    Flowsheet Row Video Visit from 04/28/2021 in [facility] Video Visit from 04/21/2021 in [facility] Video Visit from 02/10/2021 in [place] No Risk No Risk No Risk       1. Schizoaffective disorder, depressive type ([place])  2. Adjustment insomnia  Continue Geodon, Trazodone, Zoloft, Cogentin  Follow Up Instructions: In 2-3 months or sooner if needed   I discussed the assessment and treatment plan with the patient. The patient was provided an opportunity  to ask questions and all were answered. The patient agreed with the plan and demonstrated an understanding of the instructions.   The patient was advised to call back or seek an in-person evaluation if the symptoms worsen or if the condition fails to improve as anticipated.  I provided 11 minutes of non-face-to-face time during this encounter.   [name], MD

## 2021-06-28 ENCOUNTER — Other Ambulatory Visit (HOSPITAL_COMMUNITY): Payer: Self-pay | Admitting: Psychiatry

## 2021-06-28 DIAGNOSIS — F5102 Adjustment insomnia: Secondary | ICD-10-CM

## 2021-06-28 DIAGNOSIS — F251 Schizoaffective disorder, depressive type: Secondary | ICD-10-CM

## 2021-06-28 DIAGNOSIS — F4310 Post-traumatic stress disorder, unspecified: Secondary | ICD-10-CM

## 2021-07-05 ENCOUNTER — Telehealth (HOSPITAL_COMMUNITY): Payer: Self-pay

## 2021-07-05 NOTE — Telephone Encounter (Signed)
Patient called requesting a refill on her Trazodone '100mg'$  and stated she's out. It was sent in on 1/12/ 23 #180 w/ 0 refills. Sig: Take 2 tabs ('200mg'$  total) po qhs prn for sleep. That gives pt a 90 day supply so she shouldn't be out until middle of April. Writer called pt to relay this message to her but pt didn't pick up so writer LVM and let her know to call us back if not the case and writer would call pharmacy if needed ?

## 2021-07-07 ENCOUNTER — Other Ambulatory Visit (HOSPITAL_COMMUNITY): Payer: Self-pay | Admitting: Psychiatry

## 2021-07-07 DIAGNOSIS — F5102 Adjustment insomnia: Secondary | ICD-10-CM

## 2021-07-07 DIAGNOSIS — F4310 Post-traumatic stress disorder, unspecified: Secondary | ICD-10-CM

## 2021-07-07 DIAGNOSIS — F251 Schizoaffective disorder, depressive type: Secondary | ICD-10-CM

## 2021-07-07 NOTE — Telephone Encounter (Signed)
NOTIFIED PT THAT PROVIDER SENT IN TRAZODONE '100MG'$  #60 0 REFILLS. SIG; 2 TABS ('200MG'$  TOTAL) PO QHS PRN FOR SLEEP. SENT IN TODAY 07/07/21 ?

## 2021-07-17 ENCOUNTER — Other Ambulatory Visit (HOSPITAL_COMMUNITY): Payer: Self-pay | Admitting: Psychiatry

## 2021-07-17 DIAGNOSIS — F4310 Post-traumatic stress disorder, unspecified: Secondary | ICD-10-CM

## 2021-07-17 DIAGNOSIS — F251 Schizoaffective disorder, depressive type: Secondary | ICD-10-CM

## 2021-07-19 ENCOUNTER — Telehealth (HOSPITAL_COMMUNITY): Payer: Self-pay

## 2021-07-19 NOTE — Telephone Encounter (Signed)
Patient called requesting a refill on her Sertraline (Zoloft) '100mg'$  to be sent to [facility] on [address] in [place]. Pt has followup appointment scheduled for 4/20. Please review and advise. Thank you ?

## 2021-07-20 ENCOUNTER — Emergency Department (HOSPITAL_COMMUNITY): Payer: Medicare Other

## 2021-07-20 ENCOUNTER — Emergency Department (HOSPITAL_COMMUNITY)
Admission: EM | Admit: 2021-07-20 | Discharge: 2021-07-21 | Disposition: A | Discharge status: Home / Self Care | Payer: Medicare Other | Attending: Emergency Medicine | Admitting: Emergency Medicine

## 2021-07-20 ENCOUNTER — Other Ambulatory Visit (HOSPITAL_COMMUNITY): Payer: Self-pay | Admitting: *Deleted

## 2021-07-20 ENCOUNTER — Encounter (HOSPITAL_COMMUNITY): Payer: Self-pay | Admitting: Emergency Medicine

## 2021-07-20 ENCOUNTER — Other Ambulatory Visit: Payer: Self-pay

## 2021-07-20 DIAGNOSIS — Z79899 Other long term (current) drug therapy: Secondary | ICD-10-CM | POA: Insufficient documentation

## 2021-07-20 DIAGNOSIS — S3210XA Unspecified fracture of sacrum, initial encounter for closed fracture: Secondary | ICD-10-CM | POA: Diagnosis not present

## 2021-07-20 DIAGNOSIS — F17200 Nicotine dependence, unspecified, uncomplicated: Secondary | ICD-10-CM | POA: Insufficient documentation

## 2021-07-20 DIAGNOSIS — R519 Headache, unspecified: Secondary | ICD-10-CM | POA: Diagnosis not present

## 2021-07-20 DIAGNOSIS — Y92009 Unspecified place in unspecified non-institutional (private) residence as the place of occurrence of the external cause: Secondary | ICD-10-CM | POA: Insufficient documentation

## 2021-07-20 DIAGNOSIS — F4310 Post-traumatic stress disorder, unspecified: Secondary | ICD-10-CM

## 2021-07-20 DIAGNOSIS — S34139A Unspecified injury to sacral spinal cord, initial encounter: Secondary | ICD-10-CM | POA: Diagnosis present

## 2021-07-20 DIAGNOSIS — F251 Schizoaffective disorder, depressive type: Secondary | ICD-10-CM

## 2021-07-20 DIAGNOSIS — W01198A Fall on same level from slipping, tripping and stumbling with subsequent striking against other object, initial encounter: Secondary | ICD-10-CM | POA: Diagnosis not present

## 2021-07-20 DIAGNOSIS — W19XXXA Unspecified fall, initial encounter: Secondary | ICD-10-CM

## 2021-07-20 DIAGNOSIS — F5102 Adjustment insomnia: Secondary | ICD-10-CM

## 2021-07-20 DIAGNOSIS — M25562 Pain in left knee: Secondary | ICD-10-CM | POA: Insufficient documentation

## 2021-07-20 MED ORDER — TRAZODONE HCL 100 MG PO TABS: ORAL_TABLET | ORAL | 0 refills | Status: DC

## 2021-07-20 NOTE — ED Provider Triage Note (Signed)
Emergency Medicine Provider Triage Evaluation Note ? ?[name] , a 66 y.o. female  was evaluated in triage.  Pt complains of mechanical fall that occurred earlier today.  States that she fell and landed on her buttocks however then states that she hit her head.  Denies loss of consciousness.  States that she has been falling quite frequently recently.  She states that she saw her PCP earlier today and had MRIs done prior to falling.  She is currently complaining of pain to her left hip, coccyx area as well as neck.  C-collar in place. ? ?Review of Systems  ?Positive: + neck pain, coccyx pain, left hip pain, head injury ?Negative: - LOC ? ?Physical Exam  ?BP 136/78 (BP Location: Left Arm)   Pulse 74   Temp 98.7 ?F (37.1 ?C) (Oral)   Resp 15   Ht '5\' 2"'$  (1.575 m)   Wt 68 kg   SpO2 100%   BMI 27.44 kg/m?  ?Gen:   Awake, no distress   ?Resp:  Normal effort  ?MSK:   Moves extremities without difficulty  ?Other:  C collar in place. Mild midline C spine TTP. + TTP TO coccyx and L hip. No external rotation or shortening.  ? ?Medical Decision Making  ?Medically screening exam initiated at 9:18 PM.  Appropriate orders placed.  [name] was informed that the remainder of the evaluation will be completed by another provider, this initial triage assessment does not replace that evaluation, and the importance of remaining in the ED until their evaluation is complete. ? ? ?  ?Eustaquio Maize, PA-C ?07/20/21 2120 ? ?

## 2021-07-20 NOTE — ED Triage Notes (Signed)
Patient comes from home. Patient fell. No LOC. No blood thinners. Left leg pain, sacrum, and left hip. No shortening seen.  ?

## 2021-07-21 ENCOUNTER — Emergency Department (HOSPITAL_COMMUNITY): Payer: Medicare Other

## 2021-07-21 ENCOUNTER — Telehealth (HOSPITAL_COMMUNITY): Payer: Self-pay

## 2021-07-21 DIAGNOSIS — F251 Schizoaffective disorder, depressive type: Secondary | ICD-10-CM

## 2021-07-21 DIAGNOSIS — F4310 Post-traumatic stress disorder, unspecified: Secondary | ICD-10-CM

## 2021-07-21 DIAGNOSIS — S3210XA Unspecified fracture of sacrum, initial encounter for closed fracture: Secondary | ICD-10-CM | POA: Diagnosis not present

## 2021-07-21 LAB — COMPREHENSIVE METABOLIC PANEL
ALT: 29 U/L (ref 0–44)
AST: 32 U/L (ref 15–41)
Albumin: 3.9 g/dL (ref 3.5–5.0)
Alkaline Phosphatase: 145 U/L — ABNORMAL HIGH (ref 38–126)
Anion gap: 11 (ref 5–15)
BUN: 16 mg/dL (ref 8–23)
CO2: 25 mmol/L (ref 22–32)
Calcium: 9.3 mg/dL (ref 8.9–10.3)
Chloride: 96 mmol/L — ABNORMAL LOW (ref 98–111)
Creatinine, Ser: 1.05 mg/dL — ABNORMAL HIGH (ref 0.44–1.00)
GFR, Estimated: 59 mL/min — ABNORMAL LOW (ref >60)
Glucose, Bld: 105 mg/dL — ABNORMAL HIGH (ref 70–99)
Potassium: 3.2 mmol/L — ABNORMAL LOW (ref 3.5–5.1)
Sodium: 132 mmol/L — ABNORMAL LOW (ref 135–145)
Total Bilirubin: 0.7 mg/dL (ref 0.3–1.2)
Total Protein: 7.3 g/dL (ref 6.5–8.1)

## 2021-07-21 LAB — CBC WITH DIFFERENTIAL/PLATELET
Abs Immature Granulocytes: 0.09 10*3/uL — ABNORMAL HIGH (ref 0.00–0.07)
Basophils Absolute: 0.1 10*3/uL (ref 0.0–0.1)
Basophils Relative: 1 %
Eosinophils Absolute: 0.2 10*3/uL (ref 0.0–0.5)
Eosinophils Relative: 1 %
HCT: 36.6 % (ref 36.0–46.0)
Hemoglobin: 12.2 g/dL (ref 12.0–15.0)
Immature Granulocytes: 1 %
Lymphocytes Relative: 18 %
Lymphs Abs: 2.1 10*3/uL (ref 0.7–4.0)
MCH: 27.2 pg (ref 26.0–34.0)
MCHC: 33.3 g/dL (ref 30.0–36.0)
MCV: 81.5 fL (ref 80.0–100.0)
Monocytes Absolute: 0.8 10*3/uL (ref 0.1–1.0)
Monocytes Relative: 7 %
Neutro Abs: 8.1 10*3/uL — ABNORMAL HIGH (ref 1.7–7.7)
Neutrophils Relative %: 72 %
Platelets: 272 10*3/uL (ref 150–400)
RBC: 4.49 MIL/uL (ref 3.87–5.11)
RDW: 17.2 % — ABNORMAL HIGH (ref 11.5–15.5)
WBC: 11.2 10*3/uL — ABNORMAL HIGH (ref 4.0–10.5)
nRBC: 0 % (ref 0.0–0.2)

## 2021-07-21 LAB — MAGNESIUM: Magnesium: 2.2 mg/dL (ref 1.7–2.4)

## 2021-07-21 MED ORDER — HYDROCODONE-ACETAMINOPHEN 5-325 MG PO TABS
1.0000 | ORAL_TABLET | Freq: Once | ORAL | Status: AC
  Administered 2021-07-21: 1 via ORAL
  Filled 2021-07-21: qty 1

## 2021-07-21 MED ORDER — SERTRALINE HCL 100 MG PO TABS: 200.0000 mg | ORAL_TABLET | Freq: Every day | ORAL | 0 refills | Status: DC

## 2021-07-21 MED ORDER — IBUPROFEN 200 MG PO TABS
600.0000 mg | ORAL_TABLET | Freq: Once | ORAL | Status: AC
  Administered 2021-07-21: 600 mg via ORAL
  Filled 2021-07-21: qty 3

## 2021-07-21 NOTE — Telephone Encounter (Signed)
error 

## 2021-07-21 NOTE — ED Notes (Signed)
Discharge instructions reviewed, questions answered. Rx education provided. Pt advised to follow up with orthopedic doctor provided on discharge instructions. Pt states understanding and no further questions. Pt ambulatory with steady gait with walker upon discharge. No s/s of distress noted. ?

## 2021-07-21 NOTE — ED Provider Notes (Signed)
?[facility] DEPT ?Provider Note ? ? ?CSN: [id number] ?Arrival date & time: 07/20/21  2059 ? ?  ? ?History ? ?Chief Complaint  ?Patient presents with  ? Fall  ? ? ?[name] is a 66 y.o. female. ? ?The history is provided by the patient and medical records.  ?Fall ?[name] is a 66 y.o. female who presents to the Emergency Department complaining of fall. She presents to the ED for evaluation following a fall.  Today she was entering her home and she lost her balance and fell to her right side.  She landed on her bottom.  She hit her head.  Has pain in the left hip as well.  No LOC.  She has been suffering from frequent falls over the last week.  Feels like she has been falling more since being in the hospital in 2021.  Was recently seen by PCP for falls and had outpt bloodwork and MRI.   ? ?No fever, N/V.  Has sob occasionally.   ? ?Lives alone.   ? ?Smokes tobacco.  No alcohol, no drugs.   ? ?  ? ?Home Medications ?Prior to Admission medications   ?Medication Sig Start Date End Date Taking? Authorizing Provider  ?benztropine (COGENTIN) 1 MG tablet Take 1 tablet (1 mg total) by mouth daily. 04/21/21   [name], MD  ?clobetasol (TEMOVATE) 0.05 % external solution Apply 1 application topically 2 (two) times daily. 12/13/19   [provider]  ?docusate sodium (COLACE) 100 MG capsule Take 1 capsule (100 mg total) by mouth 2 (two) times daily. 01/27/20   Oswald Hillock, MD  ?fluocinonide cream (LIDEX) 2.45 % Apply 1 application topically 3 (three) times daily. ?Patient not taking: Reported on 04/28/2021 11/03/19   [provider]  ?lisinopril (ZESTRIL) 20 MG tablet Take 20 mg by mouth daily.    [provider]  ?magnesium gluconate (MAGONATE) 500 MG tablet Take 500 mg by mouth 2 (two) times daily.    [provider]  ?omeprazole (PRILOSEC) 40 MG capsule Take 40 mg by mouth in the morning and at bedtime.    [provider]   ?sertraline (ZOLOFT) 100 MG tablet Take 2 tablets (200 mg total) by mouth daily. 04/21/21 04/21/22  [name], MD  ?sucralfate (CARAFATE) 1 GM/10ML suspension Take 10 mLs (1 g total) by mouth 4 (four) times daily -  with meals and at bedtime. ?Patient taking differently: Take 1 g by mouth 4 (four) times daily. 01/20/20 04/21/21  Donne Hazel, MD  ?traZODone (DESYREL) 100 MG tablet TAKE 2 TABLETS(200 MG) BY MOUTH AT BEDTIME AS NEEDED FOR SLEEP 07/20/21   [name], MD  ?Vitamin D, Ergocalciferol, (DRISDOL) 1.25 MG (50000 UNIT) CAPS capsule Take 50,000 Units by mouth once a week. ?Patient not taking: Reported on 04/28/2021 11/21/19   [provider]  ?ziprasidone (GEODON) 80 MG capsule Take 1 capsule (80 mg total) by mouth in the morning and at bedtime. 04/21/21   [name], MD  ?   ? ?Allergies    ?Penicillins, Sulfa antibiotics, and Sulfur   ? ?Review of Systems   ?Review of Systems  ?All other systems reviewed and are negative. ? ?Physical Exam ?Updated Vital Signs ?BP (!) 143/72   Pulse 71   Temp 98.7 ?F (37.1 ?C) (Oral)   Resp 16   Ht '5\' 2"'$  (1.575 m)   Wt 68 kg   SpO2 100%   BMI 27.44 kg/m?  ?Physical Exam ?  Vitals and nursing note reviewed.  ?Constitutional:   ?   Appearance: She is well-developed.  ?HENT:  ?   Head: Normocephalic and atraumatic.  ?Cardiovascular:  ?   Rate and Rhythm: Normal rate and regular rhythm.  ?   Heart sounds: No murmur heard. ?Pulmonary:  ?   Effort: Pulmonary effort is normal. No respiratory distress.  ?   Breath sounds: Normal breath sounds.  ?Abdominal:  ?   Palpations: Abdomen is soft.  ?   Tenderness: There is no abdominal tenderness. There is no guarding or rebound.  ?Musculoskeletal:     ?   General: No tenderness.  ?   Comments: No discrete bony tenderness to BLE.  No edema.  Has pain on active range of motion of bilateral hips, right greater than left but none with passive range of motion.   ?Skin: ?   General: Skin is warm and dry.   ?Neurological:  ?   Mental Status: She is alert and oriented to person, place, and time.  ?   Comments: 5/5 strength in BUE.  5/5 strength in distal BLE.  Has pain on proximal lower extremity testing, which limits strength evaluation.  Sensation to light touch intact in BLE.   ?Psychiatric:     ?   Behavior: Behavior normal.  ? ? ?ED Results / Procedures / Treatments   ?Labs ?(all labs ordered are listed, but only abnormal results are displayed) ?Labs Reviewed  ?COMPREHENSIVE METABOLIC PANEL - Abnormal; Notable for the following components:  ?    Result Value  ? Sodium 132 (*)   ? Potassium 3.2 (*)   ? Chloride 96 (*)   ? Glucose, Bld 105 (*)   ? Creatinine, Ser 1.05 (*)   ? Alkaline Phosphatase 145 (*)   ? GFR, Estimated 59 (*)   ? All other components within normal limits  ?CBC WITH DIFFERENTIAL/PLATELET - Abnormal; Notable for the following components:  ? WBC 11.2 (*)   ? RDW 17.2 (*)   ? Neutro Abs 8.1 (*)   ? Abs Immature Granulocytes 0.09 (*)   ? All other components within normal limits  ?MAGNESIUM  ? ? ?EKG ?None ? ?Radiology ?DG Sacrum/Coccyx ? ?Result Date: 07/20/2021 ?CLINICAL DATA:  Coccygeal pain EXAM: SACRUM AND COCCYX - 2+ VIEW COMPARISON:  None. FINDINGS: Pelvic ring is within normal limits. Postsurgical changes in the proximal left femur are seen. No sacral ala abnormality is seen. No fracture is noted. No soft tissue changes are seen. Degenerative change of lower lumbar spine is noted. IMPRESSION: No acute abnormality noted. Electronically Signed   By: Inez Catalina M.D.   On: 07/20/2021 21:43  ? ?CT HEAD WO CONTRAST (5MM) ? ?Addendum Date: 07/20/2021   ?ADDENDUM REPORT: 07/20/2021 21:53 ADDENDUM: Addendum created to clarify this exam is also compared to MRI of the brain earlier today. No interval change. Electronically Signed   By: Keith Rake M.D.   On: 07/20/2021 21:53  ? ?Result Date: 07/20/2021 ?CLINICAL DATA:  Head injury, fall. EXAM: CT HEAD WITHOUT CONTRAST TECHNIQUE: Contiguous axial  images were obtained from the base of the skull through the vertex without intravenous contrast. RADIATION DOSE REDUCTION: This exam was performed according to the departmental dose-optimization program which includes automated exposure control, adjustment of the mA and/or kV according to patient size and/or use of iterative reconstruction technique. COMPARISON:  Head CT 01/08/2020 FINDINGS: Brain: No intracranial hemorrhage, mass effect, or midline shift. Brain volume is normal for age no hydrocephalus. The  basilar cisterns are patent. Moderate for age periventricular and deep white matter hypodensity typical of chronic small vessel ischemia. No evidence of territorial infarct or acute ischemia. No extra-axial or intracranial fluid collection. Vascular: No hyperdense vessel or unexpected calcification. Skull: No fracture or focal lesion. Sinuses/Orbits: Paranasal sinuses and mastoid air cells are clear. The visualized orbits are unremarkable. Other: None. IMPRESSION: 1. No acute intracranial abnormality. No skull fracture. 2. Chronic small vessel ischemic change. Electronically Signed: By: Keith Rake M.D. On: 07/20/2021 21:50  ? ?CT Cervical Spine Wo Contrast ? ?Result Date: 07/20/2021 ?CLINICAL DATA:  Neck pain, mechanical fall. EXAM: CT CERVICAL SPINE WITHOUT CONTRAST TECHNIQUE: Multidetector CT imaging of the cervical spine was performed without intravenous contrast. Multiplanar CT image reconstructions were also generated. RADIATION DOSE REDUCTION: This exam was performed according to the departmental dose-optimization program which includes automated exposure control, adjustment of the mA and/or kV according to patient size and/or use of iterative reconstruction technique. COMPARISON:  None. FINDINGS: Patient had difficulty tolerating the exam, there is mild motion artifact. Alignment: Straightening of normal lordosis. No traumatic subluxation. Skull base and vertebrae: No acute fracture. Vertebral body  heights are maintained. The dens and skull base are intact. Soft tissues and spinal canal: No prevertebral fluid or swelling. No visible canal hematoma. Disc levels: Disc space narrowing and endplate spurring from W2-X9 through C6-C7. P

## 2021-07-21 NOTE — ED Notes (Signed)
Pt's friend Lattie Haw called at [phone] and states that she will be here in about 45 mins to pick patient up from lobby. ?

## 2021-07-21 NOTE — ED Notes (Signed)
Patient ambulated slowly but safety in the room. ?

## 2021-07-28 ENCOUNTER — Telehealth (HOSPITAL_COMMUNITY): Payer: Self-pay | Admitting: Psychiatry

## 2021-07-28 ENCOUNTER — Telehealth (HOSPITAL_COMMUNITY): Payer: Medicaid Other | Admitting: Psychiatry

## 2021-07-28 NOTE — Telephone Encounter (Signed)
Patient was not present on video platform used through mychart. I called the patient at our scheduled appointment time. There was no answer. I left a voice message for patient to call the clinic back at their convinence.  ? ?

## 2021-08-03 ENCOUNTER — Telehealth: Payer: Self-pay | Admitting: Orthopaedic Surgery

## 2021-08-03 ENCOUNTER — Other Ambulatory Visit: Payer: Self-pay | Admitting: Physician Assistant

## 2021-08-03 MED ORDER — TRAMADOL HCL 50 MG PO TABS: 50.0000 mg | ORAL_TABLET | Freq: Two times a day (BID) | ORAL | 0 refills | Status: DC | PRN

## 2021-08-03 NOTE — Telephone Encounter (Signed)
Patient aware.

## 2021-08-03 NOTE — Telephone Encounter (Signed)
Sent in tramadol

## 2021-08-03 NOTE — Telephone Encounter (Signed)
Pt called requesting pain medication until her appt on Friday. Pt has a fractured tried over the counter and its not touching her pain. Please send to pharmacy on file. [facility]. Pt phone number is [phone] ?

## 2021-08-05 ENCOUNTER — Ambulatory Visit: Payer: Medicaid Other | Admitting: Orthopaedic Surgery

## 2021-08-11 ENCOUNTER — Telehealth (HOSPITAL_COMMUNITY): Payer: Self-pay | Admitting: Psychiatry

## 2021-08-11 ENCOUNTER — Telehealth (HOSPITAL_COMMUNITY): Payer: Medicaid Other | Admitting: Psychiatry

## 2021-08-11 NOTE — Telephone Encounter (Signed)
Patient was not present on video platform used through mychart. I called the patient at our scheduled appointment time. There was no answer. I left a voice message for patient to call the clinic back at their convinence.  ? ?

## 2021-08-12 ENCOUNTER — Telehealth: Payer: Self-pay | Admitting: Orthopaedic Surgery

## 2021-08-12 NOTE — Telephone Encounter (Signed)
Cannot refill until seen

## 2021-08-12 NOTE — Telephone Encounter (Signed)
Patient aware.

## 2021-08-12 NOTE — Telephone Encounter (Signed)
Patient called asked if she can get something for pain? Patient said she took Tramadol and it did not work.   Patient said she was scheduled for 08/05/2021 appointment but  could not make it because of the pain she is in. The number to contact patient is [phone] ?

## 2021-08-17 ENCOUNTER — Other Ambulatory Visit (HOSPITAL_COMMUNITY): Payer: Self-pay | Admitting: *Deleted

## 2021-08-17 ENCOUNTER — Telehealth (HOSPITAL_COMMUNITY): Payer: Self-pay | Admitting: *Deleted

## 2021-08-17 DIAGNOSIS — F4310 Post-traumatic stress disorder, unspecified: Secondary | ICD-10-CM

## 2021-08-17 DIAGNOSIS — F5102 Adjustment insomnia: Secondary | ICD-10-CM

## 2021-08-17 DIAGNOSIS — F251 Schizoaffective disorder, depressive type: Secondary | ICD-10-CM

## 2021-08-17 MED ORDER — SERTRALINE HCL 100 MG PO TABS: ORAL_TABLET | ORAL | 0 refills | Status: DC

## 2021-08-17 MED ORDER — BENZTROPINE MESYLATE 1 MG PO TABS: 1.0000 mg | ORAL_TABLET | Freq: Every day | ORAL | 0 refills | Status: DC

## 2021-08-17 MED ORDER — ZIPRASIDONE HCL 80 MG PO CAPS: 80.0000 mg | ORAL_CAPSULE | Freq: Two times a day (BID) | ORAL | 0 refills | Status: DC

## 2021-08-17 MED ORDER — TRAZODONE HCL 100 MG PO TABS: ORAL_TABLET | ORAL | 0 refills | Status: DC

## 2021-08-17 NOTE — Telephone Encounter (Signed)
Writer returned pt call regarding bridge scripts so she may have meds until appointment on 08/25/21 as she no showed on last 2 appointments. Bridge was sent. Unable to leave message as pt VM full.  ?

## 2021-08-19 ENCOUNTER — Ambulatory Visit: Payer: Medicaid Other | Admitting: Orthopaedic Surgery

## 2021-08-23 ENCOUNTER — Ambulatory Visit (INDEPENDENT_AMBULATORY_CARE_PROVIDER_SITE_OTHER): Payer: Medicare Other | Admitting: Orthopaedic Surgery

## 2021-08-23 ENCOUNTER — Encounter: Payer: Self-pay | Admitting: Orthopaedic Surgery

## 2021-08-23 ENCOUNTER — Ambulatory Visit (INDEPENDENT_AMBULATORY_CARE_PROVIDER_SITE_OTHER): Payer: Medicare Other

## 2021-08-23 DIAGNOSIS — M533 Sacrococcygeal disorders, not elsewhere classified: Secondary | ICD-10-CM

## 2021-08-23 NOTE — Progress Notes (Signed)
? ?Office Visit Note ?  ?Patient: [name]           ?Date of Birth: [birth year]           ?MRN: [id number] ?Visit Date: 08/23/2021 ?             ?Requested by: Bartholome Bill, MD ?[address] ?[address],  [place] [address] ?PCP: Bartholome Bill, MD ? ? ?Assessment & Plan: ?Visit Diagnoses:  ?1. Pain in sacrum   ? ? ?Plan: Impression is nondisplaced sacral fracture.  We will can treat this symptomatically with rest, ice and medication.  She will use her walker as needed.  I have also recommended getting a doughnut to sit on.  She will follow-up with [place] in 4 weeks time for recheck.  Call with concerns or questions in the meantime. ? ?Follow-Up Instructions: Return in about 4 weeks (around 09/20/2021).  ? ?Orders:  ?Orders Placed This Encounter  ?Procedures  ? XR Sacrum/Coccyx  ? ?No orders of the defined types were placed in this encounter. ? ? ? ? Procedures: ?No procedures performed ? ? ?Clinical Data: ?No additional findings. ? ? ?Subjective: ?Chief Complaint  ?Patient presents with  ? Pelvis - Pain  ? ? ?HPI patient is a pleasant 66 year old female who comes in today for follow-up of sacral fracture.  This occurred about 1 month ago after sustaining multiple falls during 1-1/2-week period of time.  It sounds like the falls have been due to vestibular issues for which she is seeing her PCP.  She was seen in the ED where CT scanning of the pelvis was eventually obtained which showed the fracture.  She has been having pain to the left buttock worse with walking, sitting or lying down.  She has been taking oxycodone provided by her PCP. ? ?Review of Systems as detailed in HPI.  All others reviewed and are negative. ? ? ?Objective: ?Vital Signs: There were no vitals taken for this visit. ? ?Physical Exam well-developed well-nourished female no acute distress.  Alert and oriented x3. ? ?Ortho Exam x-rays demonstrate moderate tenderness to the left buttock.  She has increased pain with hip  flexion and abduction.  She is neurovascular intact distally. ? ?Specialty Comments:  ?No specialty comments available. ? ?Imaging: ?XR Sacrum/Coccyx ? ?Result Date: 08/23/2021 ?Stable sacral fracture  ? ? ?PMFS History: ?Patient Active Problem List  ? Diagnosis Date Noted  ? Schizoaffective disorder, bipolar type ([place])   ? Closed intertrochanteric fracture of left femur, initial encounter ([place]) 01/24/2020  ? HTN (hypertension) 01/12/2020  ? Hypophosphatemia 01/12/2020  ? Hypokalemia 01/12/2020  ? Acute pancreatitis 01/08/2020  ? Depression 01/08/2020  ? Diverticulosis 01/08/2020  ? Tubular adenoma of colon 01/08/2020  ? Sepsis, unspecified organism ([place]) 01/08/2020  ? Prolapsed internal hemorrhoids, grade 3 12/01/2015  ? Schizoaffective disorder, depressive type ([place]) 11/04/2013  ? PTSD (post-traumatic stress disorder) 11/04/2013  ? Cigarette nicotine dependence without complication 63/84/5364  ? ?Past Medical History:  ?Diagnosis Date  ? Depression   ? Diverticulosis   ? GERD (gastroesophageal reflux disease)   ? IBS (irritable bowel syndrome)   ? Internal hemorrhoid   ? 2nd degree  ? Myocarditis ([place]) 2009  ? Osteoarthritis   ? Prolapsed internal hemorrhoids, grade 3 12/01/2015  ? Schizoaffective disorder ([place])   ? Tubular adenoma of colon 08/2011  ?  ?Family History  ?Problem Relation Age of Onset  ? Suicidality Father   ? Depression Father   ?  Suicidality Sister   ? Depression Sister   ? Suicidality Cousin   ? Suicidality Other   ?  ?Past Surgical History:  ?Procedure Laterality Date  ? BREAST BIOPSY Right 12/27/2012  ? CHOLECYSTECTOMY    ? HEMORRHOID BANDING    ? INTRAMEDULLARY (IM) NAIL INTERTROCHANTERIC Left 01/24/2020  ? Procedure: INTRAMEDULLARY (IM) NAIL INTERTROCHANTRIC;  Surgeon: Leandrew Koyanagi, MD;  Location: [facility];  Service: Orthopedics;  Laterality: Left;  ? ?Social History  ? ?Occupational History  ? Occupation: Disabled  ?Tobacco Use  ? Smoking status: Former  ?  Packs/day: 1.00  ?  Years: 38.00  ?   Pack years: 38.00  ?  Types: Cigarettes  ?  Quit date: 05/06/2018  ?  Years since quitting: 3.3  ? Smokeless tobacco: Never  ?Vaping Use  ? Vaping Use: Never used  ?Substance and Sexual Activity  ? Alcohol use: No  ?  Alcohol/week: 0.0 standard drinks  ? Drug use: No  ? Sexual activity: Not Currently  ? ? ? ? ? ? ?

## 2021-08-24 ENCOUNTER — Encounter: Payer: Self-pay | Admitting: *Deleted

## 2021-08-25 ENCOUNTER — Telehealth (HOSPITAL_BASED_OUTPATIENT_CLINIC_OR_DEPARTMENT_OTHER): Payer: Medicare Other | Admitting: Psychiatry

## 2021-08-25 DIAGNOSIS — F5102 Adjustment insomnia: Secondary | ICD-10-CM

## 2021-08-25 DIAGNOSIS — F251 Schizoaffective disorder, depressive type: Secondary | ICD-10-CM | POA: Diagnosis not present

## 2021-08-25 DIAGNOSIS — F4310 Post-traumatic stress disorder, unspecified: Secondary | ICD-10-CM

## 2021-08-25 MED ORDER — TRAZODONE HCL 100 MG PO TABS: ORAL_TABLET | ORAL | 0 refills | Status: DC

## 2021-08-25 MED ORDER — ZIPRASIDONE HCL 80 MG PO CAPS: 80.0000 mg | ORAL_CAPSULE | Freq: Two times a day (BID) | ORAL | 0 refills | Status: DC

## 2021-08-25 MED ORDER — SERTRALINE HCL 100 MG PO TABS: ORAL_TABLET | ORAL | 0 refills | Status: DC

## 2021-08-25 MED ORDER — BENZTROPINE MESYLATE 1 MG PO TABS: 1.0000 mg | ORAL_TABLET | Freq: Every day | ORAL | 0 refills | Status: DC

## 2021-08-25 NOTE — Progress Notes (Signed)
Virtual Visit via Phone Note  I connected with [name] on 08/25/21 at 10:00 AM EDT by phone, as we could not connect by video enabled telemedicine application despite multiple attempts.  I verified that I am speaking with the correct person using two identifiers.  Location: Patient: home Provider: office   I discussed the limitations of evaluation and management by telemedicine and the availability of in person appointments. The patient expressed understanding and agreed to proceed.  History of Present Illness: " I am doing a lot better". [name] shares she is feeling better. She missed our last appointment due to severe stomach pain. Her PCP gave her some Oxycodone and it is helping. She is getting worked up for the cause of the falls.   She is "feeling like I used to feel now". [name] denies any depression and anxiety in over 2 weeks ago. [name] she felt down for 1 day when she had uncontrolled pain. Her overall outlook is positive. [name] is eating 1-2 meals/day. Her sleep is good. But energy is low. Her paranoia is significantly decreases since she got the cameras. [name] thinks her ex-husband gave up on seeing her. [name] denies AVH and ideas of reference.She states she is not talking in her head either. [name] denies SI/HI. She shares that her PTSD symptoms seem to have resoled. The meds are really helping and she wants to continue them.    Observations/Objective: Psychiatric Specialty Exam:  General Appearance: unable to assess  Eye Contact:  unable to assess  Speech:  Clear and Coherent and Normal Rate  Volume:  Normal  Mood:  Euthymic  Affect:  Constricted  Thought Process:  concrete, Coherent, Linear, and Descriptions of Associations: Intact  Orientation:  Full (Time, Place, and Person)  Thought Content:  Logical  Suicidal Thoughts:  No  Homicidal Thoughts:  No  Memory:  Immediate;   Good  Judgement:  Good  Insight:  Fair  Psychomotor Activity: unable to assess   Concentration:  Concentration: Good  Recall:  Good  Fund of Knowledge:  Good  Language:  Good  Akathisia:  unable to assess  Handed:  unable to assess  AIMS (if indicated):     Assets:  Communication Skills Desire for Improvement Financial Resources/Insurance Housing Leisure Time Resilience Social Support Talents/Skills Transportation Vocational/Educational  ADL's:  unable to assess  Cognition:  WNL  Sleep:         Assessment and Plan:     08/25/2021   10:14 AM 04/28/2021    9:35 AM 04/21/2021    3:53 PM 02/10/2021   10:58 AM 01/27/2021    3:16 PM  Depression screen PHQ 2/9  Decreased Interest 0 1 3 0 3  Down, Depressed, Hopeless 0 1 3 0 3  PHQ - 2 Score 0 2 6 0 6  Altered sleeping  0 '3 1 1  '$ Tired, decreased energy  3 3 0 1  Change in appetite  3 3 0 3  Feeling bad or failure about yourself   1 2 0 0  Trouble concentrating  2 2 0 1  Moving slowly or fidgety/restless  0 0 0 0  Suicidal thoughts  0 0 0 0  PHQ-9 Score  '11 19 1 12  '$ Difficult doing work/chores  Somewhat difficult Very difficult Somewhat difficult Somewhat difficult    Flowsheet Row Video Visit from 08/25/2021 in [facility] from 07/20/2021 in [facility] Video Visit from 04/28/2021 in [facility]  C-SSRS RISK CATEGORY No Risk No Risk No Risk        Status of current problems: stable  Meds:  1. Schizoaffective disorder, depressive type (HCC) - benztropine (COGENTIN) 1 MG tablet; Take 1 tablet (1 mg total) by mouth daily.  Dispense: 90 tablet; Refill: 0 - sertraline (ZOLOFT) 100 MG tablet; TAKE 2 TABLETS(200 MG) BY MOUTH DAILY  Dispense: 180 tablet; Refill: 0 - traZODone (DESYREL) 100 MG tablet; TAKE 2 TABLETS(200 MG) BY MOUTH AT BEDTIME AS NEEDED FOR SLEEP  Dispense: 180 tablet; Refill: 0 - ziprasidone (GEODON) 80 MG capsule; Take 1 capsule (80 mg total) by mouth in the morning and at  bedtime.  Dispense: 180 capsule; Refill: 0  2. PTSD (post-traumatic stress disorder) - sertraline (ZOLOFT) 100 MG tablet; TAKE 2 TABLETS(200 MG) BY MOUTH DAILY  Dispense: 180 tablet; Refill: 0 - traZODone (DESYREL) 100 MG tablet; TAKE 2 TABLETS(200 MG) BY MOUTH AT BEDTIME AS NEEDED FOR SLEEP  Dispense: 180 tablet; Refill: 0  3. Adjustment insomnia - traZODone (DESYREL) 100 MG tablet; TAKE 2 TABLETS(200 MG) BY MOUTH AT BEDTIME AS NEEDED FOR SLEEP  Dispense: 180 tablet; Refill: 0     Labs: reviewed labs done 07/21/21 Na low K low Creatinine mildly elevated  She is working with her doctor regarding treatment and determining reason for falls  Collaboration of Care: Other none today  Patient/Guardian was advised Release of Information must be obtained prior to any record release in order to collaborate their care with an outside provider. Patient/Guardian was advised if they have not already done so to contact the registration department to sign all necessary forms in order for [place] to release information regarding their care.   Consent: Patient/Guardian gives verbal consent for treatment and assignment of benefits for services provided during this visit. Patient/Guardian expressed understanding and agreed to proceed.    Follow Up Instructions: Follow up in 17 or sooner if needed    I discussed the assessment and treatment plan with the patient. The patient was provided an opportunity to ask questions and all were answered. The patient agreed with the plan and demonstrated an understanding of the instructions.   The patient was advised to call back or seek an in-person evaluation if the symptoms worsen or if the condition fails to improve as anticipated.  I provided 16 minutes of non-face-to-face time during this encounter.   [name], MD

## 2021-08-29 ENCOUNTER — Encounter: Payer: Self-pay | Admitting: Psychiatry

## 2021-08-29 ENCOUNTER — Ambulatory Visit (INDEPENDENT_AMBULATORY_CARE_PROVIDER_SITE_OTHER): Payer: Medicare Other | Admitting: Psychiatry

## 2021-08-29 ENCOUNTER — Telehealth: Payer: Self-pay | Admitting: Psychiatry

## 2021-08-29 VITALS — BP 110/66 | HR 75 | Ht 62.0 in | Wt 158.7 lb

## 2021-08-29 DIAGNOSIS — R2689 Other abnormalities of gait and mobility: Secondary | ICD-10-CM

## 2021-08-29 DIAGNOSIS — G629 Polyneuropathy, unspecified: Secondary | ICD-10-CM | POA: Diagnosis not present

## 2021-08-29 NOTE — Telephone Encounter (Signed)
Referral for Physical Therapy sent to [facility] [phone]. 

## 2021-08-29 NOTE — Progress Notes (Signed)
[facility]  PATIENT: [name] DOB: 23-Feb-1956  REFERRING CLINICIAN: Bartholome Bill, MD HISTORY FROM: self REASON FOR VISIT: dizziness, leg weakness, falls   HISTORICAL  CHIEF COMPLAINT:  Chief Complaint  Patient presents with   Fall    RM 1 alone  Pt is well, has been having frequent falls and dizzy spells     HISTORY OF PRESENT ILLNESS:  The patient presents with imbalance and multiple falls over the past year. States at one point she had 6 falls over the span of 1.5 weeks. Her last fall was last month, and it was bad enough that she fractured her tailbone.  States that when she falls she will lean to the right and then her legs will give out. She has tripped over objects as well. Her legs have felt weak recently. She has a lot of back pain which she attributes to her tailbone fracture. She does endorse pain radiating from her lower back which goes all the way down her left leg. Has numbness and paresthesias in bilateral hands and feet. She does drop things with both of her hands. Denies radicular pain down her arms.  States she previously would get vertigo when lying on her right side, but this has improved. Sometimes she will feel lightheaded as well. She usually does not feel dizzy when she falls.  She has arthritis in bilateral knees which causes pain when walking. States she needs knee surgery but has been delaying it  MRI brain 07/20/21 showed mild-moderate chronic microvascular ischemic changes and no acute process.  OTHER MEDICAL CONDITIONS: HTN, schizoaffective disorder, PTSD   REVIEW OF SYSTEMS: Full 14 system review of systems performed and negative with exception of: imbalance, vertigo  ALLERGIES: Allergies  Allergen Reactions   Penicillins     Did it involve swelling of the face/tongue/throat, SOB, or low BP? N Did it involve sudden or severe rash/hives, skin peeling, or any reaction on the inside of your mouth or nose? Y Did  you need to seek medical attention at a hospital or doctor's office? N When did it last happen? Several Years Ago      If all above answers are "NO", may proceed with cephalosporin use.      Sulfa Antibiotics Rash   Sulfur Rash    HOME MEDICATIONS: Outpatient Medications Prior to Visit  Medication Sig Dispense Refill   benztropine (COGENTIN) 1 MG tablet Take 1 tablet (1 mg total) by mouth daily. 90 tablet 0   clobetasol (TEMOVATE) 0.05 % external solution Apply 1 application. topically 2 (two) times daily.     lisinopril (ZESTRIL) 20 MG tablet Take 20 mg by mouth daily.     magnesium gluconate (MAGONATE) 500 MG tablet Take 500 mg by mouth 2 (two) times daily.     omeprazole (PRILOSEC) 40 MG capsule Take 40 mg by mouth in the morning and at bedtime.     oxycodone (OXY-IR) 5 MG capsule Take 5 mg by mouth every 12 (twelve) hours as needed.     sertraline (ZOLOFT) 100 MG tablet TAKE 2 TABLETS(200 MG) BY MOUTH DAILY 180 tablet 0   sucralfate (CARAFATE) 1 GM/10ML suspension Take 10 mLs (1 g total) by mouth 4 (four) times daily -  with meals and at bedtime. (Patient taking differently: Take 1 g by mouth 4 (four) times daily.) 1200 mL 0   traZODone (DESYREL) 100 MG tablet TAKE 2 TABLETS(200 MG) BY MOUTH AT BEDTIME AS NEEDED FOR SLEEP 180 tablet 0  Vitamin D, Ergocalciferol, (DRISDOL) 1.25 MG (50000 UNIT) CAPS capsule Take 50,000 Units by mouth once a week.     ziprasidone (GEODON) 80 MG capsule Take 1 capsule (80 mg total) by mouth in the morning and at bedtime. 180 capsule 0   docusate sodium (COLACE) 100 MG capsule Take 1 capsule (100 mg total) by mouth 2 (two) times daily. (Patient not taking: Reported on 08/25/2021) 10 capsule 0   fluocinonide cream (LIDEX) 7.02 % Apply 1 application. topically 3 (three) times daily. (Patient not taking: Reported on 08/29/2021)     traMADol (ULTRAM) 50 MG tablet Take 1-2 tablets (50-100 mg total) by mouth every 12 (twelve) hours as needed. (Patient not taking:  Reported on 08/25/2021) 30 tablet 0   No facility-administered medications prior to visit.    PAST MEDICAL HISTORY: Past Medical History:  Diagnosis Date   DDD (degenerative disc disease), lumbar    Depression    Diverticulosis    Dizziness    GERD (gastroesophageal reflux disease)    IBS (irritable bowel syndrome)    Internal hemorrhoid    2nd degree   Myocarditis ([place]) 2009   Osteoarthritis    Prolapsed internal hemorrhoids, grade 3 12/01/2015   Recurrent falls    Schizoaffective disorder ([place])    Tubular adenoma of colon 08/2011    PAST SURGICAL HISTORY: Past Surgical History:  Procedure Laterality Date   BREAST BIOPSY Right 12/27/2012   CHOLECYSTECTOMY     HEMORRHOID BANDING     INTRAMEDULLARY (IM) NAIL INTERTROCHANTERIC Left 01/24/2020   Procedure: INTRAMEDULLARY (IM) NAIL INTERTROCHANTRIC;  Surgeon: Leandrew Koyanagi, MD;  Location: [facility];  Service: Orthopedics;  Laterality: Left;    FAMILY HISTORY: Family History  Problem Relation Age of Onset   Suicidality Father    Depression Father    Suicidality Sister    Depression Sister    Suicidality Cousin    Suicidality Other     SOCIAL HISTORY: Social History   Socioeconomic History   Marital status: Divorced    Spouse name: Not on file   Number of children: 2   Years of education: Not on file   Highest education level: Not on file  Occupational History   Occupation: Disabled  Tobacco Use   Smoking status: Former    Packs/day: 0.50    Years: 38.00    Pack years: 19.00    Types: Cigarettes    Quit date: 09/09/2019    Years since quitting: 1.9   Smokeless tobacco: Never  Vaping Use   Vaping Use: Never used  Substance and Sexual Activity   Alcohol use: No    Alcohol/week: 0.0 standard drinks   Drug use: No   Sexual activity: Not Currently  Other Topics Concern   Not on file  Social History Narrative   Not on file   Social Determinants of Health   Financial Resource Strain: Not on file  Food  Insecurity: Not on file  Transportation Needs: Not on file  Physical Activity: Not on file  Stress: Not on file  Social Connections: Not on file  Intimate Partner Violence: Not on file     PHYSICAL EXAM  GENERAL EXAM/CONSTITUTIONAL: Vitals:  Vitals:   08/29/21 1027  BP: 110/66  Pulse: 75  Weight: 158 lb 11.7 oz (72 kg)  Height: 5' 2" (1.575 m)   Body mass index is 29.03 kg/m. Wt Readings from Last 3 Encounters:  08/29/21 158 lb 11.7 oz (72 kg)  07/20/21 150 lb (68 kg)  05/24/20 158 lb 11.7 oz (72 kg)   MUSCULOSKELETAL: Gait, strength, tone, movements noted in Neurologic exam below  NEUROLOGIC: MENTAL STATUS:  awake, alert, oriented to person, place and time recent and remote memory intact normal attention and concentration  CRANIAL NERVE:  2nd, 3rd, 4th, 6th - pupils equal and reactive to light, visual fields full to confrontation, extraocular muscles intact, no nystagmus 5th - facial sensation symmetric 7th - facial strength symmetric 8th - hearing intact 9th - palate elevates symmetrically, uvula midline 11th - shoulder shrug symmetric 12th - tongue protrusion midline  MOTOR:  4/5 left arm extension, leg strength exam limited by pain but at least 4/5 bilateral hip flexion, 5/5 knee flexion/extension  SENSORY:  decreased sensation to pinprick LUE and LLE, decreased vibration left foot up to ankle, proprioception intact all 4 extremities  COORDINATION:  finger-nose-finger, heel to shin intact bilaterally  REFLEXES:  deep tendon reflexes present and symmetric  GAIT/STATION:  Walks with walker, decreased stride length, antalgic gait     DIAGNOSTIC DATA (LABS, IMAGING, TESTING) - I reviewed patient records, labs, notes, testing and imaging myself where available.  Lab Results  Component Value Date   WBC 11.2 (H) 07/21/2021   HGB 12.2 07/21/2021   HCT 36.6 07/21/2021   MCV 81.5 07/21/2021   PLT 272 07/21/2021      Component Value Date/Time   NA  132 (L) 07/21/2021 0134   NA 138 05/03/2017 0940   K 3.2 (L) 07/21/2021 0134   CL 96 (L) 07/21/2021 0134   CO2 25 07/21/2021 0134   GLUCOSE 105 (H) 07/21/2021 0134   BUN 16 07/21/2021 0134   BUN 7 (L) 05/03/2017 0940   CREATININE 1.05 (H) 07/21/2021 0134   CALCIUM 9.3 07/21/2021 0134   PROT 7.3 07/21/2021 0134   PROT 7.0 05/03/2017 0940   ALBUMIN 3.9 07/21/2021 0134   ALBUMIN 4.5 05/03/2017 0940   AST 32 07/21/2021 0134   ALT 29 07/21/2021 0134   ALKPHOS 145 (H) 07/21/2021 0134   BILITOT 0.7 07/21/2021 0134   BILITOT 0.4 05/03/2017 0940   GFRNONAA 59 (L) 07/21/2021 0134   GFRAA >60 01/13/2020 0558   Lab Results  Component Value Date   CHOL 111 01/16/2020   HDL 31 (L) 01/16/2020   LDLCALC 61 01/16/2020   TRIG 95 01/16/2020   CHOLHDL 3.6 01/16/2020   Lab Results  Component Value Date   HGBA1C 5.0 01/08/2020   No results found for: VITAMINB12 Lab Results  Component Value Date   TSH 2.970 05/03/2017    07/12/21 TSH 1.35, B12 454  01/08/20 A1c 5.0  MRI brain 07/20/21: mild-moderate chronic microvascular ischemic changes  Carotid duplex 06/24/21: less than 50% stenosis right ICA, no significant left ICA stenosis  CT C-spine 07/20/21: Disc space narrowing from C4-5 through C6-7, narrowing of spinal canal C4-5  Lumbar spine X-ray 2018: multilevel degenerative changes, scoliotic curvature  ASSESSMENT AND PLAN  66 y.o. year old female with a history of HTN, schizoaffective disorder, PTSD who presents for evaluation of imbalance and falls over the past year. Brain MRI was unrevealing. She reports paresthesias of hands and feet and radicular pain in her left leg. Will order neuropathy legs and MRI L-spine. Her knee pain is also likely contributing to her gait difficulty. Referral to PT placed to help with her balance. She does not report vertigo today though it sounds like she has had vertigo previously. Attempted Dix-Hallpike, but was unable to hold supine position for more than  a  few seconds due to pain in her tailbone. Discussed referral to vestibular therapy if vertigo returns.   1. Imbalance   2. Neuropathy       PLAN: -Blood work: A1c, myeloma panel -MRI L-spine -Referral to Physical Therapy  Orders Placed This Encounter  Procedures   MR LUMBAR SPINE WO CONTRAST   Hemoglobin A1c   Multiple Myeloma Panel (SPEP&IFE w/QIG)   Ambulatory referral to Physical Therapy    No orders of the defined types were placed in this encounter.   Return in about 6 months (around 03/01/2022).    Genia Harold, MD 08/29/21 11:24 AM  I spent an average of 54 minutes chart reviewing and counseling the patient, with at least 50% of the time face to face with the patient.   [facility] [address], [place], [place] [address] [phone]

## 2021-08-29 NOTE — Patient Instructions (Addendum)
MRI lumbar spine Physical therapy for balance Blood work   Preventing Falls at [facility] are common, often dreaded events in the lives of older people. Aside from the obvious injuries and even death that may result, fall can cause wide-ranging consequences including loss of independence, mental decline, decreased activity and mobility. Younger people are also at risk of falling, especially those with chronic illnesses and fatigue.  Ways to reduce risk for falling   Examine diet and medications. Warm foods and alcohol dilate blood vessels, which can lead to dizziness when standing. Sleep aids, antidepressants and pain medications can also increase the likelihood of a fall.   Get a vision exam. Poor vision, cataracts and glaucoma increase the chances of falling.   Check foot gear. Shoes should fit snugly and have a sturdy, nonskid sole and a broad, low heel   Participate in a physician-approved exercise program to build and maintain muscle strength and improve balance and coordination. Programs that use ankle weights or stretch bands are excellent for muscle-strengthening. Water aerobics programs and low-impact Tai Chi programs have also been shown to improve balance and coordination.   Increase vitamin D intake. Vitamin D improves muscle strength and increases the amount of calcium the body is able to absorb and deposit in bones.  How to prevent falls from common hazards   Floors -- Remove all loose wires, cords, and throw rugs. Minimize clutter. Make sure rugs are anchored and smooth. Keep furniture in its usual place.   Chairs -- Use chairs with straight backs, armrests and firm seats. Add firm cushions to existing pieces to add height.   Bathroom -- Install grab bars and non-skid tape in the tub or shower. Use a bathtub transfer bench or a shower chair with a back support Use an elevated toilet seat and/or safety rails to assist standing from a low surface. Do not use towel racks or bathroom  tissue holders to help you stand.   Lighting -- Make sure halls, stairways, and entrances are well-lit. Install a night light in your bathroom or hallway. Make sure there is a light switch at the top and bottom of the staircase. Turn lights on if you get up in the middle of the night. Make sure lamps or light switches are within reach of the bed if you have to get up during the night.   Kitchen -- Install non-skid rubber mats near the sink and stove. Clean spills immediately. Store frequently used utensils, pots, pans between waist and eye level. This helps prevent reaching and bending. Sit when getting things out of lower cupboards.   Living room / [place] furniture with wide spaces in between, giving enough room to move around. Establish a route through the living room that gives you something to hold onto as you walk.   Stairs -- Make sure treads, rails, and rugs are secure. Install a rail on both sides of the stairs. If stairs are a threat, it might be helpful to arrange most of your activities on the lower level to reduce the number of times you must climb the stairs.   Entrances and doorways -- Install metal handles on the walls adjacent to the doorknobs of all doors to make it more secure as you travel through the doorway.   Tips for maintaining balance   Keep at least one hand free at all times. Try using a backpack or fanny pack to hold things rather than carrying them in your hands. Never carry  objects in both hands when walking as this interferes with keeping your balance.   Attempt to swing both arms from front to back while walking. Consciously lift your feet off of the ground when walking. Shuffling and dragging of the feet is a common culprit in losing your balance.   When trying to navigate turns, use a "U" technique of facing forward and making a wide turn, rather than pivoting sharply.   Try to stand with your feet shoulder-length apart. When your feet are close together for any  length of time, you increase your risk of losing your balance and falling.   Do one thing at a time. Don't try to walk and accomplish another task, such as reading or looking around. The decrease in your automatic reflexes complicates motor function, so the less distraction, the better.   Do not wear rubber or gripping soled shoes, they might "catch" on the floor and cause tripping.   Move slowly when changing positions. Use deliberate, concentrated movements and, if needed, use a grab bar or walking aid. Count 15 seconds between each movement. For example, when rising from a seated position, wait 15 seconds after standing to begin walking.   If balance is a continuous problem, you might want to consider a walking aid such as a cane, walking stick, or walker. Once you've mastered walking with help, you might be ready to try it on your own again.

## 2021-08-30 ENCOUNTER — Telehealth: Payer: Self-pay | Admitting: Psychiatry

## 2021-08-30 NOTE — Telephone Encounter (Signed)
[employer] medicare NPR sent to GI they will call the patient to schedule 

## 2021-09-01 ENCOUNTER — Telehealth: Payer: Self-pay | Admitting: *Deleted

## 2021-09-01 ENCOUNTER — Other Ambulatory Visit: Payer: Self-pay | Admitting: Psychiatry

## 2021-09-01 DIAGNOSIS — D890 Polyclonal hypergammaglobulinemia: Secondary | ICD-10-CM

## 2021-09-01 LAB — MULTIPLE MYELOMA PANEL, SERUM
Albumin SerPl Elph-Mcnc: 3.4 g/dL (ref 2.9–4.4)
Albumin/Glob SerPl: 1.1 (ref 0.7–1.7)
Alpha 1: 0.3 g/dL (ref 0.0–0.4)
Alpha2 Glob SerPl Elph-Mcnc: 0.8 g/dL (ref 0.4–1.0)
B-Globulin SerPl Elph-Mcnc: 1.2 g/dL (ref 0.7–1.3)
Gamma Glob SerPl Elph-Mcnc: 0.9 g/dL (ref 0.4–1.8)
Globulin, Total: 3.2 g/dL (ref 2.2–3.9)
IgA/Immunoglobulin A, Serum: 408 mg/dL — ABNORMAL HIGH (ref 87–352)
IgG (Immunoglobin G), Serum: 791 mg/dL (ref 586–1602)
IgM (Immunoglobulin M), Srm: 160 mg/dL (ref 26–217)
Total Protein: 6.6 g/dL (ref 6.0–8.5)

## 2021-09-01 LAB — HEMOGLOBIN A1C
Est. average glucose Bld gHb Est-mCnc: 114 mg/dL
Hgb A1c MFr Bld: 5.6 % (ref 4.8–5.6)

## 2021-09-01 NOTE — Telephone Encounter (Signed)
LVM requesting call back for lab results.  

## 2021-09-07 ENCOUNTER — Other Ambulatory Visit: Payer: Self-pay | Admitting: Psychiatry

## 2021-09-07 DIAGNOSIS — D890 Polyclonal hypergammaglobulinemia: Secondary | ICD-10-CM

## 2021-09-07 NOTE — Telephone Encounter (Signed)
Called patient and left detailed VM: Her A1c level is normal and does not show evidence of diabetes.   Her myeloma panel shows an increase in antibodies. This can be seen with infections or inflammation. Her previous blood work shows a slightly high white blood cell count and slightly elevated liver enzymes. This may be the cause of the increased antibodies. Autoimmune conditions and HIV infection can also cause these findings. I have ordered blood work to test for these conditions.  I gave her lab hours, and left # for questions.

## 2021-09-08 NOTE — Therapy (Incomplete)
OUTPATIENT PHYSICAL THERAPY NEURO EVALUATION   Patient Name: [name] MRN: [id number] DOB:March 09, 1956, 66 y.o., female Today's Date: 09/08/2021   PCP: Bartholome Bill, MD REFERRING PROVIDER: Genia Harold, MD     Past Medical History:  Diagnosis Date   DDD (degenerative disc disease), lumbar    Depression    Diverticulosis    Dizziness    GERD (gastroesophageal reflux disease)    IBS (irritable bowel syndrome)    Internal hemorrhoid    2nd degree   Myocarditis ([place]) 2009   Osteoarthritis    Prolapsed internal hemorrhoids, grade 3 12/01/2015   Recurrent falls    Schizoaffective disorder ([place])    Tubular adenoma of colon 08/2011   Past Surgical History:  Procedure Laterality Date   BREAST BIOPSY Right 12/27/2012   CHOLECYSTECTOMY     HEMORRHOID BANDING     INTRAMEDULLARY (IM) NAIL INTERTROCHANTERIC Left 01/24/2020   Procedure: INTRAMEDULLARY (IM) NAIL INTERTROCHANTRIC;  Surgeon: Leandrew Koyanagi, MD;  Location: [facility];  Service: Orthopedics;  Laterality: Left;   Patient Active Problem List   Diagnosis Date Noted   Schizoaffective disorder, bipolar type ([place])    Closed intertrochanteric fracture of left femur, initial encounter ([place]) 01/24/2020   HTN (hypertension) 01/12/2020   Hypophosphatemia 01/12/2020   Hypokalemia 01/12/2020   Acute pancreatitis 01/08/2020   Depression 01/08/2020   Diverticulosis 01/08/2020   Tubular adenoma of colon 01/08/2020   Sepsis, unspecified organism ([place]) 01/08/2020   Prolapsed internal hemorrhoids, grade 3 12/01/2015   Schizoaffective disorder, depressive type ([place]) 11/04/2013   PTSD (post-traumatic stress disorder) 11/04/2013   Cigarette nicotine dependence without complication 48/18/5631    ONSET DATE: ***  REFERRING DIAG: R26.89 (ICD-10-CM) - Imbalance   THERAPY DIAG:  No diagnosis found.  Rationale for Evaluation and Treatment Rehabilitation  SUBJECTIVE:                                                                                                                                                                                               SUBJECTIVE STATEMENT: *** Pt accompanied by: {accompnied:27141}  PERTINENT HISTORY:  HTN, schizoaffective disorder, PTSD, depression, dizziness, GERD, L hip IM nailing 2021; current nondisplaced sacral fracture  PAIN:  Are you having pain? {OPRCPAIN:27236}  PRECAUTIONS: Other: current sacral fx  WEIGHT BEARING RESTRICTIONS No  FALLS: Has patient fallen in last 6 months? Yes. Number of falls ***  LIVING ENVIRONMENT: Lives with: {OPRC lives with:25569::"lives with their family"} Lives in: {Lives in:25570} Stairs: {opstairs:27293} Has following equipment at home: {Assistive devices:23999}  PLOF: {PLOF:24004}  PATIENT GOALS ***  OBJECTIVE:   DIAGNOSTIC FINDINGS: diagnosed with nondisplaced  sacral fracture 08/23/21 by Dr. Erlinda Hong. head and cervical CT from 07/20/21 showed no acute changes  COGNITION: Overall cognitive status: {cognition:24006}   SENSATION: {sensation:27233}  COORDINATION: ***  EDEMA:  {edema:24020}   POSTURE: {posture:25561}  LOWER EXTREMITY ROM:     Active  Right Eval Left Eval  Hip flexion    Hip extension    Hip abduction    Hip adduction    Hip internal rotation    Hip external rotation    Knee flexion    Knee extension    Ankle dorsiflexion    Ankle plantarflexion    Ankle inversion    Ankle eversion     (Blank rows = not tested)  LOWER EXTREMITY MMT:    MMT Right Eval Left Eval  Hip flexion    Hip extension    Hip abduction    Hip adduction    Hip internal rotation    Hip external rotation    Knee flexion    Knee extension    Ankle dorsiflexion    Ankle plantarflexion    Ankle inversion    Ankle eversion    (Blank rows = not tested)  GAIT: Gait pattern: {gait characteristics:25376} Distance walked: *** Assistive device utilized: {Assistive devices:23999} Level of assistance: {Levels of  assistance:24026} Comments: ***  FUNCTIONAL TESTs:  {Functional tests:24029}   TODAY'S TREATMENT:  ***   PATIENT EDUCATION: Education details: *** Person educated: {Person educated:25204} Education method: {Education Method:25205} Education comprehension: {Education Comprehension:25206}   HOME EXERCISE PROGRAM: ***   GOALS: Goals reviewed with patient? Yes  SHORT TERM GOALS: Target date: {follow up:25551}  Patient to be independent with initial HEP. Baseline: HEP initiated Goal status: INITIAL    LONG TERM GOALS: Target date: {follow up:25551}  Patient to be independent with advanced HEP. Baseline: Not yet initiated  Goal status: INITIAL  Patient to demonstrate B LE strength >/=4+/5.  Baseline: See above Goal status: INITIAL  Patient to demonstrate *** ROM WFL and without pain limiting.  Baseline: *** Goal status: INITIAL  Patient to report and demonstrate improved head, neck, and shoulder posture at rest and with activity.  Baseline: *** Goal status: INITIAL  Patient to demonstrate alternating reciprocal pattern when ascending and descending stairs with good stability and 1 handrail as needed.   Baseline: Unable Goal status: INITIAL  Patient to score at least 20/24 on DGI in order to decrease risk of falls.  Baseline: *** Goal status: INITIAL  Patient to complete TUG in <14 sec with LRAD in order to decrease risk of falls.   Baseline: *** Goal status: INITIAL  Patient to demonstrate 5xSTS test in <15 sec in order to decrease risk of falls.  Baseline: *** Goal status: INITIAL  Patient to score at least ***/56 on [place] in order to decrease risk of falls.  Baseline: *** Goal status: INITIAL   ASSESSMENT:  CLINICAL IMPRESSION:  Patient is a 66 y/o F presenting to OPPT with c/o imbalance and multiple falls for the past ***  Patient today presenting with ***.    Patient was educated on gentle *** HEP and reported understanding. Prior to  current episode, patient was independent. Would benefit from skilled PT services *** x/week for *** weeks to address aforementioned impairments in order to optimize level of function.     OBJECTIVE IMPAIRMENTS {opptimpairments:25111}.   ACTIVITY LIMITATIONS {activitylimitations:27494}  PARTICIPATION LIMITATIONS: {participationrestrictions:25113}  PERSONAL FACTORS {Personal factors:25162} are also affecting patient's functional outcome.   REHAB POTENTIAL: {rehabpotential:25112}  CLINICAL DECISION MAKING: {clinical  decision making:25114}  EVALUATION COMPLEXITY: {Evaluation complexity:25115}  PLAN: PT FREQUENCY: {rehab frequency:25116}  PT DURATION: {rehab duration:25117}  PLANNED INTERVENTIONS: {rehab planned interventions:25118::"Therapeutic exercises","Therapeutic activity","Neuromuscular re-education","Balance training","Gait training","Patient/Family education","Joint mobilization"}  PLAN FOR NEXT SESSION: Manuela Neptune, PT 09/08/2021, 9:50 AM

## 2021-09-12 ENCOUNTER — Ambulatory Visit: Payer: Medicare Other | Attending: Psychiatry | Admitting: Physical Therapy

## 2021-09-18 ENCOUNTER — Emergency Department (HOSPITAL_BASED_OUTPATIENT_CLINIC_OR_DEPARTMENT_OTHER): Payer: Medicare Other

## 2021-09-18 ENCOUNTER — Inpatient Hospital Stay (HOSPITAL_BASED_OUTPATIENT_CLINIC_OR_DEPARTMENT_OTHER)
Admission: EM | Admit: 2021-09-18 | Discharge: 2021-09-23 | DRG: 640 | Disposition: A | Discharge status: Home / Self Care | Payer: Medicare Other | Attending: Internal Medicine | Admitting: Internal Medicine

## 2021-09-18 ENCOUNTER — Other Ambulatory Visit: Payer: Self-pay

## 2021-09-18 DIAGNOSIS — E860 Dehydration: Secondary | ICD-10-CM | POA: Diagnosis not present

## 2021-09-18 DIAGNOSIS — Z9049 Acquired absence of other specified parts of digestive tract: Secondary | ICD-10-CM

## 2021-09-18 DIAGNOSIS — F4310 Post-traumatic stress disorder, unspecified: Secondary | ICD-10-CM | POA: Diagnosis present

## 2021-09-18 DIAGNOSIS — F0390 Unspecified dementia without behavioral disturbance: Secondary | ICD-10-CM | POA: Diagnosis present

## 2021-09-18 DIAGNOSIS — Z86010 Personal history of colonic polyps: Secondary | ICD-10-CM

## 2021-09-18 DIAGNOSIS — B9689 Other specified bacterial agents as the cause of diseases classified elsewhere: Secondary | ICD-10-CM | POA: Diagnosis present

## 2021-09-18 DIAGNOSIS — K219 Gastro-esophageal reflux disease without esophagitis: Secondary | ICD-10-CM | POA: Diagnosis present

## 2021-09-18 DIAGNOSIS — N390 Urinary tract infection, site not specified: Secondary | ICD-10-CM | POA: Diagnosis present

## 2021-09-18 DIAGNOSIS — F32A Depression, unspecified: Secondary | ICD-10-CM | POA: Diagnosis present

## 2021-09-18 DIAGNOSIS — E871 Hypo-osmolality and hyponatremia: Principal | ICD-10-CM | POA: Diagnosis present

## 2021-09-18 DIAGNOSIS — Z881 Allergy status to other antibiotic agents status: Secondary | ICD-10-CM

## 2021-09-18 DIAGNOSIS — K760 Fatty (change of) liver, not elsewhere classified: Secondary | ICD-10-CM | POA: Diagnosis present

## 2021-09-18 DIAGNOSIS — F17210 Nicotine dependence, cigarettes, uncomplicated: Secondary | ICD-10-CM | POA: Diagnosis present

## 2021-09-18 DIAGNOSIS — E876 Hypokalemia: Secondary | ICD-10-CM | POA: Diagnosis present

## 2021-09-18 DIAGNOSIS — K529 Noninfective gastroenteritis and colitis, unspecified: Secondary | ICD-10-CM | POA: Diagnosis present

## 2021-09-18 DIAGNOSIS — Z880 Allergy status to penicillin: Secondary | ICD-10-CM

## 2021-09-18 DIAGNOSIS — Z79899 Other long term (current) drug therapy: Secondary | ICD-10-CM

## 2021-09-18 DIAGNOSIS — Z818 Family history of other mental and behavioral disorders: Secondary | ICD-10-CM

## 2021-09-18 DIAGNOSIS — K589 Irritable bowel syndrome without diarrhea: Secondary | ICD-10-CM | POA: Diagnosis present

## 2021-09-18 DIAGNOSIS — I10 Essential (primary) hypertension: Secondary | ICD-10-CM | POA: Diagnosis present

## 2021-09-18 DIAGNOSIS — D649 Anemia, unspecified: Secondary | ICD-10-CM | POA: Diagnosis present

## 2021-09-18 DIAGNOSIS — E861 Hypovolemia: Secondary | ICD-10-CM | POA: Diagnosis present

## 2021-09-18 DIAGNOSIS — K859 Acute pancreatitis without necrosis or infection, unspecified: Secondary | ICD-10-CM | POA: Diagnosis present

## 2021-09-18 DIAGNOSIS — F250 Schizoaffective disorder, bipolar type: Secondary | ICD-10-CM | POA: Diagnosis present

## 2021-09-18 DIAGNOSIS — R9431 Abnormal electrocardiogram [ECG] [EKG]: Secondary | ICD-10-CM | POA: Diagnosis present

## 2021-09-18 DIAGNOSIS — E878 Other disorders of electrolyte and fluid balance, not elsewhere classified: Secondary | ICD-10-CM | POA: Diagnosis present

## 2021-09-18 DIAGNOSIS — K5730 Diverticulosis of large intestine without perforation or abscess without bleeding: Secondary | ICD-10-CM | POA: Diagnosis present

## 2021-09-18 LAB — COMPREHENSIVE METABOLIC PANEL
ALT: 18 U/L (ref 0–44)
AST: 23 U/L (ref 15–41)
Albumin: 4.3 g/dL (ref 3.5–5.0)
Alkaline Phosphatase: 140 U/L — ABNORMAL HIGH (ref 38–126)
Anion gap: 16 — ABNORMAL HIGH (ref 5–15)
BUN: 5 mg/dL — ABNORMAL LOW (ref 8–23)
CO2: 21 mmol/L — ABNORMAL LOW (ref 22–32)
Calcium: 9.7 mg/dL (ref 8.9–10.3)
Chloride: 79 mmol/L — ABNORMAL LOW (ref 98–111)
Creatinine, Ser: 0.78 mg/dL (ref 0.44–1.00)
GFR, Estimated: >60 mL/min (ref >60)
Glucose, Bld: 152 mg/dL — ABNORMAL HIGH (ref 70–99)
Potassium: 2.1 mmol/L — CL (ref 3.5–5.1)
Sodium: 116 mmol/L — CL (ref 135–145)
Total Bilirubin: 1.2 mg/dL (ref 0.3–1.2)
Total Protein: 7.7 g/dL (ref 6.5–8.1)

## 2021-09-18 LAB — URINALYSIS, ROUTINE W REFLEX MICROSCOPIC
Bilirubin Urine: NEGATIVE
Glucose, UA: NEGATIVE mg/dL
Hgb urine dipstick: NEGATIVE
Ketones, ur: NEGATIVE mg/dL
Nitrite: POSITIVE — AB
Protein, ur: NEGATIVE mg/dL
Specific Gravity, Urine: 1.011 (ref 1.005–1.030)
pH: 6.5 (ref 5.0–8.0)

## 2021-09-18 LAB — CBC
HCT: 32.5 % — ABNORMAL LOW (ref 36.0–46.0)
Hemoglobin: 11.9 g/dL — ABNORMAL LOW (ref 12.0–15.0)
MCH: 28.8 pg (ref 26.0–34.0)
MCHC: 36.6 g/dL — ABNORMAL HIGH (ref 30.0–36.0)
MCV: 78.7 fL — ABNORMAL LOW (ref 80.0–100.0)
Platelets: 288 10*3/uL (ref 150–400)
RBC: 4.13 MIL/uL (ref 3.87–5.11)
RDW: 17.2 % — ABNORMAL HIGH (ref 11.5–15.5)
WBC: 10.7 10*3/uL — ABNORMAL HIGH (ref 4.0–10.5)
nRBC: 0 % (ref 0.0–0.2)

## 2021-09-18 LAB — LIPASE, BLOOD: Lipase: <10 U/L — ABNORMAL LOW (ref 11–51)

## 2021-09-18 LAB — MAGNESIUM: Magnesium: 1.2 mg/dL — ABNORMAL LOW (ref 1.7–2.4)

## 2021-09-18 MED ORDER — SODIUM CHLORIDE 0.9 % IV SOLN: INTRAVENOUS | Status: DC | PRN

## 2021-09-18 MED ORDER — LACTATED RINGERS IV BOLUS
1000.0000 mL | Freq: Once | INTRAVENOUS | Status: AC
  Administered 2021-09-18: 1000 mL via INTRAVENOUS

## 2021-09-18 MED ORDER — ONDANSETRON HCL 4 MG/2ML IJ SOLN
4.0000 mg | Freq: Once | INTRAMUSCULAR | Status: AC | PRN
Start: 2021-09-18 — End: 2021-09-18
  Administered 2021-09-18: 4 mg via INTRAVENOUS
  Filled 2021-09-18: qty 2

## 2021-09-18 MED ORDER — MORPHINE SULFATE (PF) 4 MG/ML IV SOLN
4.0000 mg | Freq: Once | INTRAVENOUS | Status: AC
  Administered 2021-09-18: 4 mg via INTRAVENOUS
  Filled 2021-09-18: qty 1

## 2021-09-18 MED ORDER — ONDANSETRON HCL 4 MG/2ML IJ SOLN
4.0000 mg | Freq: Once | INTRAMUSCULAR | Status: DC
  Filled 2021-09-18: qty 2

## 2021-09-18 MED ORDER — SODIUM CHLORIDE 0.9 % IV SOLN: Freq: Once | INTRAVENOUS | Status: AC

## 2021-09-18 MED ORDER — IOHEXOL 300 MG/ML  SOLN
100.0000 mL | Freq: Once | INTRAMUSCULAR | Status: AC | PRN
  Administered 2021-09-18: 100 mL via INTRAVENOUS

## 2021-09-18 MED ORDER — MAGNESIUM SULFATE 2 GM/50ML IV SOLN
2.0000 g | Freq: Once | INTRAVENOUS | Status: AC
  Administered 2021-09-18: 2 g via INTRAVENOUS
  Filled 2021-09-18: qty 50

## 2021-09-18 MED ORDER — POTASSIUM CHLORIDE 10 MEQ/100ML IV SOLN
10.0000 meq | INTRAVENOUS | Status: AC
  Administered 2021-09-18 – 2021-09-19 (×4): 10 meq via INTRAVENOUS
  Filled 2021-09-18 (×3): qty 100

## 2021-09-18 NOTE — ED Provider Notes (Signed)
[facility] EMERGENCY DEPT Provider Note   CSN: [id number] Arrival date & time: 09/18/21  1922     History  Chief Complaint  Patient presents with   Emesis    FRIMY UFFELMAN is a 66 y.o. female.  Patient is a 66 year old female with a history of schizoaffective disorder, depression, myocarditis, IBS and recurrent falls who is presenting today with multiple complaints.  Patient reports for the last 1 week she has had nausea vomiting diarrhea and abdominal pain.  She is having approximately 3 diarrhea stools per day and at least 2 or 3 episodes of emesis.  She has had poor oral intake and scribes diffuse abdominal tenderness.  She has not had any urinary symptoms.  She denies any recent medication changes.  She denies alcohol use and reports she drinks about six 8 ounce glasses of water per day.  She denies history of chronic abdominal pain.  She has not had fever.  She denies chest pain or shortness of breath.  She does report that in the last few days when she gets up she feels lightheaded and she has passed out a few times and thinks she may have hit her head.  She does not take anticoagulation.  The history is provided by the patient.  Emesis      Home Medications Prior to Admission medications   Medication Sig Start Date End Date Taking? Authorizing Provider  benztropine (COGENTIN) 1 MG tablet Take 1 tablet (1 mg total) by mouth daily. 08/25/21   [name], MD  clobetasol (TEMOVATE) 0.05 % external solution Apply 1 application. topically 2 (two) times daily. 12/13/19   [provider]  lisinopril (ZESTRIL) 20 MG tablet Take 20 mg by mouth daily.    [provider]  magnesium gluconate (MAGONATE) 500 MG tablet Take 500 mg by mouth 2 (two) times daily.    [provider]  omeprazole (PRILOSEC) 40 MG capsule Take 40 mg by mouth in the morning and at bedtime.    [provider]  oxycodone (OXY-IR) 5 MG capsule Take 5 mg by mouth  every 12 (twelve) hours as needed.    [provider]  sertraline (ZOLOFT) 100 MG tablet TAKE 2 TABLETS(200 MG) BY MOUTH DAILY 08/25/21   [name], MD  sucralfate (CARAFATE) 1 GM/10ML suspension Take 10 mLs (1 g total) by mouth 4 (four) times daily -  with meals and at bedtime. Patient taking differently: Take 1 g by mouth 4 (four) times daily. 01/20/20 08/29/21  Donne Hazel, MD  traZODone (DESYREL) 100 MG tablet TAKE 2 TABLETS(200 MG) BY MOUTH AT BEDTIME AS NEEDED FOR SLEEP 08/25/21   [name], MD  Vitamin D, Ergocalciferol, (DRISDOL) 1.25 MG (50000 UNIT) CAPS capsule Take 50,000 Units by mouth once a week. 11/21/19   [provider]  ziprasidone (GEODON) 80 MG capsule Take 1 capsule (80 mg total) by mouth in the morning and at bedtime. 08/25/21   [name], MD      Allergies    Penicillins, Sulfa antibiotics, and Sulfur    Review of Systems   Review of Systems  Gastrointestinal:  Positive for vomiting.    Physical Exam Updated Vital Signs BP (!) 142/63   Pulse 64   Temp 97.7 F (36.5 C) (Oral)   Resp 19   Ht '5\' 2"'$  (1.575 m)   Wt 61.7 kg   SpO2 98%   BMI 24.87 kg/m  Physical Exam Vitals and nursing note reviewed.  Constitutional:  General: She is not in acute distress.    Appearance: She is well-developed. She is ill-appearing.  HENT:     Head: Normocephalic.     Comments: Multiple areas of ecchymosis over the forehead in various stages of healing    Mouth/Throat:     Mouth: Mucous membranes are dry.  Eyes:     Pupils: Pupils are equal, round, and reactive to light.  Cardiovascular:     Rate and Rhythm: Normal rate and regular rhythm.     Heart sounds: Normal heart sounds. No murmur heard.    No friction rub.  Pulmonary:     Effort: Pulmonary effort is normal. Tachypnea present.     Breath sounds: Normal breath sounds. No wheezing or rales.  Abdominal:     General: Bowel sounds are normal. There is no distension.      Palpations: Abdomen is soft.     Tenderness: There is abdominal tenderness. There is no guarding or rebound.     Comments: Mild diffuse tenderness without rebound or guarding  Musculoskeletal:        General: No tenderness. Normal range of motion.     Cervical back: Normal range of motion and neck supple. No tenderness.     Right lower leg: No edema.     Left lower leg: No edema.     Comments: No edema  Skin:    General: Skin is warm and dry.     Findings: No rash.  Neurological:     Mental Status: She is alert and oriented to person, place, and time. Mental status is at baseline.     Cranial Nerves: No cranial nerve deficit.  Psychiatric:        Behavior: Behavior normal.     ED Results / Procedures / Treatments   Labs (all labs ordered are listed, but only abnormal results are displayed) Labs Reviewed  LIPASE, BLOOD - Abnormal; Notable for the following components:      Result Value   Lipase <10 (*)    All other components within normal limits  COMPREHENSIVE METABOLIC PANEL - Abnormal; Notable for the following components:   Sodium 116 (*)    Potassium 2.1 (*)    Chloride 79 (*)    CO2 21 (*)    Glucose, Bld 152 (*)    BUN 5 (*)    Alkaline Phosphatase 140 (*)    Anion gap 16 (*)    All other components within normal limits  CBC - Abnormal; Notable for the following components:   WBC 10.7 (*)    Hemoglobin 11.9 (*)    HCT 32.5 (*)    MCV 78.7 (*)    MCHC 36.6 (*)    RDW 17.2 (*)    All other components within normal limits  URINALYSIS, ROUTINE W REFLEX MICROSCOPIC - Abnormal; Notable for the following components:   Nitrite POSITIVE (*)    Leukocytes,Ua MODERATE (*)    Bacteria, UA RARE (*)    All other components within normal limits  MAGNESIUM - Abnormal; Notable for the following components:   Magnesium 1.2 (*)    All other components within normal limits    EKG EKG Interpretation  Date/Time:  Sunday September 18 2021 22:34:12 EDT Ventricular Rate:  63 PR  Interval:  191 QRS Duration: 106 QT Interval:  541 QTC Calculation: 554 R Axis:   78 Text Interpretation: Sinus rhythm LVH with secondary repolarization abnormality new Prolonged QT interval Confirmed by Blanchie Dessert [phone]) on 09/18/2021  11:25:25 PM  Radiology CT Head Wo Contrast  Result Date: 09/18/2021 CLINICAL DATA:  Head trauma. EXAM: CT HEAD WITHOUT CONTRAST TECHNIQUE: Contiguous axial images were obtained from the base of the skull through the vertex without intravenous contrast. RADIATION DOSE REDUCTION: This exam was performed according to the departmental dose-optimization program which includes automated exposure control, adjustment of the mA and/or kV according to patient size and/or use of iterative reconstruction technique. COMPARISON:  Head CT dated 07/20/2021. FINDINGS: Brain: Mild age-related atrophy and chronic microvascular ischemic changes. There is no acute intracranial hemorrhage. No mass effect or midline shift. No extra-axial fluid collection. Vascular: No hyperdense vessel or unexpected calcification. Skull: Normal. Negative for fracture or focal lesion. Sinuses/Orbits: No acute finding. Other: None IMPRESSION: 1. No acute intracranial pathology. 2. Mild age-related atrophy and chronic microvascular ischemic changes. Electronically Signed   By: Anner Crete M.D.   On: 09/18/2021 22:36   CT ABDOMEN PELVIS W CONTRAST  Result Date: 09/18/2021 CLINICAL DATA:  Abdominal pain. EXAM: CT ABDOMEN AND PELVIS WITH CONTRAST TECHNIQUE: Multidetector CT imaging of the abdomen and pelvis was performed using the standard protocol following bolus administration of intravenous contrast. RADIATION DOSE REDUCTION: This exam was performed according to the departmental dose-optimization program which includes automated exposure control, adjustment of the mA and/or kV according to patient size and/or use of iterative reconstruction technique. CONTRAST:  169m OMNIPAQUE IOHEXOL 300 MG/ML  SOLN  COMPARISON:  July 21, 2021 FINDINGS: Lower chest: No acute abnormality. Hepatobiliary: There is diffuse fatty infiltration of the liver parenchyma. No focal liver abnormality is seen. Status post cholecystectomy. No biliary dilatation. Pancreas: Unremarkable. No pancreatic ductal dilatation or surrounding inflammatory changes. Spleen: Normal in size without focal abnormality. Adrenals/Urinary Tract: Adrenal glands are unremarkable. Kidneys are normal in size, without renal calculi or hydronephrosis. A 6.8 cm x 6.3 cm simple cyst is seen within the lower pole of the left kidney. No additional follow-up per imaging is recommended. Bladder is unremarkable. Stomach/Bowel: Stomach is within normal limits. Appendix appears normal. No evidence of bowel wall thickening, distention, or inflammatory changes. Noninflamed diverticula are seen throughout the sigmoid colon. Vascular/Lymphatic: Aortic atherosclerosis. No enlarged abdominal or pelvic lymph nodes. Reproductive: Uterus and bilateral adnexa are unremarkable. Other: No abdominal wall hernia or abnormality. No abdominopelvic ascites. Musculoskeletal: A metallic density intramedullary rod and compression screw device is seen within the proximal left femur. Degenerative changes are seen within the lumbar spine, most prominent at the level of L3-L4. IMPRESSION: 1. Hepatic steatosis. 2. Evidence of prior cholecystectomy. 3. Sigmoid diverticulosis. Aortic Atherosclerosis (ICD10-I70.0). Electronically Signed   By: TVirgina NorfolkM.D.   On: 09/18/2021 21:06    Procedures Procedures    Medications Ordered in ED Medications  ondansetron (ZOFRAN) injection 4 mg (4 mg Intravenous Not Given 09/18/21 2122)  potassium chloride 10 mEq in 100 mL IVPB (10 mEq Intravenous New Bag/Given 09/18/21 2259)  0.9 %  sodium chloride infusion ( Intravenous New Bag/Given 09/18/21 2146)  magnesium sulfate IVPB 2 g 50 mL (2 g Intravenous New Bag/Given 09/18/21 2249)  0.9 %  sodium  chloride infusion ( Intravenous New Bag/Given 09/18/21 2248)  0.9 %  sodium chloride infusion ( Intravenous New Bag/Given 09/18/21 2258)  ondansetron (ZOFRAN) injection 4 mg (4 mg Intravenous Given 09/18/21 2013)  morphine (PF) 4 MG/ML injection 4 mg (4 mg Intravenous Given 09/18/21 2013)  lactated ringers bolus 1,000 mL (0 mLs Intravenous Stopped 09/18/21 2153)  iohexol (OMNIPAQUE) 300 MG/ML solution 100 mL (100 mLs Intravenous Contrast  Given 09/18/21 2049)  0.9 %  sodium chloride infusion ( Intravenous New Bag/Given 09/18/21 2140)    ED Course/ Medical Decision Making/ A&P                           Medical Decision Making Amount and/or Complexity of Data Reviewed External Data Reviewed: notes. Labs: ordered. Decision-making details documented in ED Course. Radiology: ordered and independent interpretation performed. Decision-making details documented in ED Course. ECG/medicine tests: ordered and independent interpretation performed. Decision-making details documented in ED Course.  Risk Prescription drug management. Drug therapy requiring intensive monitoring for toxicity. Decision regarding hospitalization.   Pt with multiple medical problems and comorbidities and presenting today with a complaint that caries a high risk for morbidity and mortality.  Presenting today with 1 week of nausea vomiting and diarrhea, abdominal pain and syncope with standing.  Patient is ill-appearing.  Vital signs without significant abnormality.  Patient does have diffuse abdominal tenderness on exam but no rebound or guarding.  Concern for electrolyte abnormality, acute kidney injury, diverticulitis, obstruction.  I independently interpreted patient's labs and EKG.  EKG with new mild ST sloping depressed waves and new prolonged QT.  UA is positive for leukocytes and nitrites but only 11-20 white blood cells and rare bacteria.  Patient is not complaining of any urinary symptoms at this time.  Culture was done but do  not feel that she needs antibiotics.  Lipase within normal limits, CBC with with minimal leukocytosis of 10.7 but normal platelets and hemoglobin stable.  Magnesium is low today at 1.2, CMP today with hyponatremia of 116, hypokalemia of 2.1, normal renal function, mild elevated anion gap of 16.  I have independently visualized and interpreted pt's images today.  Head CT without acute intracranial injury such as bleed.  CT abdomen pelvis without evidence of hydronephrosis, renal stone or small bowel obstruction.  Radiology reports no acute findings.  Findings discussed with the patient.  She was treated with multiple rounds of potassium, magnesium and after a bolus of fluid was started on a saline infusion.  She does not have history of alcohol use, does not drink excessive amounts of water and has not had any change in her psychiatric medications.  Patient's sodium was normal just a few weeks ago.  Feel that all of this is related to dehydration.  She has not normal mental status but given abnormality of electrolytes feel that patient needs admission for further care.  The hospitalist Dr. Alcario Drought was consulted.  They will admit the patient.  CRITICAL CARE Performed by: Eryca Bolte Total critical care time: 30 minutes Critical care time was exclusive of separately billable procedures and treating other patients. Critical care was necessary to treat or prevent imminent or life-threatening deterioration. Critical care was time spent personally by me on the following activities: development of treatment plan with patient and/or surrogate as well as nursing, discussions with consultants, evaluation of patient's response to treatment, examination of patient, obtaining history from patient or surrogate, ordering and performing treatments and interventions, ordering and review of laboratory studies, ordering and review of radiographic studies, pulse oximetry and re-evaluation of patient's  condition.           Final Clinical Impression(s) / ED Diagnoses Final diagnoses:  Hyponatremia  Hypochloremia  Hypokalemia  Hypomagnesemia  Dehydration    Rx / DC Orders ED Discharge Orders     None         Blanchie Dessert, MD  09/18/21 2328  

## 2021-09-18 NOTE — ED Notes (Signed)
Dr Maryan Rued aware of K level of 2.1 and Na level  of 116. Primary RN aware as well. Pt is already on cardiac monitor.

## 2021-09-18 NOTE — ED Triage Notes (Signed)
POV, pt sts that she has had NVD since Tuesday, sts that she thinks she passed out a couple days ago, unsure of when or what happened. Sts that she does not remember a lot. Sts that "whole stomach from throwing up" is 10/10 pain

## 2021-09-19 ENCOUNTER — Observation Stay (HOSPITAL_COMMUNITY): Payer: Medicare Other

## 2021-09-19 ENCOUNTER — Encounter (HOSPITAL_COMMUNITY): Payer: Self-pay

## 2021-09-19 DIAGNOSIS — R197 Diarrhea, unspecified: Secondary | ICD-10-CM | POA: Diagnosis not present

## 2021-09-19 DIAGNOSIS — E878 Other disorders of electrolyte and fluid balance, not elsewhere classified: Secondary | ICD-10-CM

## 2021-09-19 DIAGNOSIS — E861 Hypovolemia: Secondary | ICD-10-CM | POA: Diagnosis present

## 2021-09-19 DIAGNOSIS — N3000 Acute cystitis without hematuria: Secondary | ICD-10-CM | POA: Diagnosis not present

## 2021-09-19 DIAGNOSIS — F250 Schizoaffective disorder, bipolar type: Secondary | ICD-10-CM | POA: Diagnosis not present

## 2021-09-19 DIAGNOSIS — K859 Acute pancreatitis without necrosis or infection, unspecified: Secondary | ICD-10-CM | POA: Diagnosis not present

## 2021-09-19 DIAGNOSIS — R9431 Abnormal electrocardiogram [ECG] [EKG]: Secondary | ICD-10-CM

## 2021-09-19 DIAGNOSIS — F17210 Nicotine dependence, cigarettes, uncomplicated: Secondary | ICD-10-CM

## 2021-09-19 DIAGNOSIS — N390 Urinary tract infection, site not specified: Secondary | ICD-10-CM | POA: Diagnosis present

## 2021-09-19 DIAGNOSIS — Z818 Family history of other mental and behavioral disorders: Secondary | ICD-10-CM | POA: Diagnosis not present

## 2021-09-19 DIAGNOSIS — K760 Fatty (change of) liver, not elsewhere classified: Secondary | ICD-10-CM | POA: Diagnosis not present

## 2021-09-19 DIAGNOSIS — Z9049 Acquired absence of other specified parts of digestive tract: Secondary | ICD-10-CM | POA: Diagnosis not present

## 2021-09-19 DIAGNOSIS — F0390 Unspecified dementia without behavioral disturbance: Secondary | ICD-10-CM | POA: Diagnosis not present

## 2021-09-19 DIAGNOSIS — Z881 Allergy status to other antibiotic agents status: Secondary | ICD-10-CM | POA: Diagnosis not present

## 2021-09-19 DIAGNOSIS — E876 Hypokalemia: Secondary | ICD-10-CM | POA: Diagnosis present

## 2021-09-19 DIAGNOSIS — E871 Hypo-osmolality and hyponatremia: Principal | ICD-10-CM

## 2021-09-19 DIAGNOSIS — B9689 Other specified bacterial agents as the cause of diseases classified elsewhere: Secondary | ICD-10-CM | POA: Diagnosis present

## 2021-09-19 DIAGNOSIS — I10 Essential (primary) hypertension: Secondary | ICD-10-CM | POA: Diagnosis not present

## 2021-09-19 DIAGNOSIS — E860 Dehydration: Secondary | ICD-10-CM | POA: Diagnosis not present

## 2021-09-19 DIAGNOSIS — F32A Depression, unspecified: Secondary | ICD-10-CM | POA: Diagnosis not present

## 2021-09-19 DIAGNOSIS — K219 Gastro-esophageal reflux disease without esophagitis: Secondary | ICD-10-CM | POA: Diagnosis present

## 2021-09-19 DIAGNOSIS — K529 Noninfective gastroenteritis and colitis, unspecified: Secondary | ICD-10-CM | POA: Diagnosis not present

## 2021-09-19 DIAGNOSIS — D649 Anemia, unspecified: Secondary | ICD-10-CM | POA: Diagnosis not present

## 2021-09-19 DIAGNOSIS — Z880 Allergy status to penicillin: Secondary | ICD-10-CM | POA: Diagnosis not present

## 2021-09-19 DIAGNOSIS — Z79899 Other long term (current) drug therapy: Secondary | ICD-10-CM | POA: Diagnosis not present

## 2021-09-19 LAB — BASIC METABOLIC PANEL
Anion gap: 10 (ref 5–15)
Anion gap: 7 (ref 5–15)
Anion gap: 10 (ref 5–15)
Anion gap: 7 (ref 5–15)
BUN: <5 mg/dL — ABNORMAL LOW (ref 8–23)
BUN: <5 mg/dL — ABNORMAL LOW (ref 8–23)
BUN: <5 mg/dL — ABNORMAL LOW (ref 8–23)
BUN: <5 mg/dL — ABNORMAL LOW (ref 8–23)
CO2: 24 mmol/L (ref 22–32)
CO2: 24 mmol/L (ref 22–32)
CO2: 23 mmol/L (ref 22–32)
CO2: 25 mmol/L (ref 22–32)
Calcium: 8.6 mg/dL — ABNORMAL LOW (ref 8.9–10.3)
Calcium: 8.5 mg/dL — ABNORMAL LOW (ref 8.9–10.3)
Calcium: 8.6 mg/dL — ABNORMAL LOW (ref 8.9–10.3)
Calcium: 8.4 mg/dL — ABNORMAL LOW (ref 8.9–10.3)
Chloride: 92 mmol/L — ABNORMAL LOW (ref 98–111)
Chloride: 94 mmol/L — ABNORMAL LOW (ref 98–111)
Chloride: 93 mmol/L — ABNORMAL LOW (ref 98–111)
Chloride: 100 mmol/L (ref 98–111)
Creatinine, Ser: 0.7 mg/dL (ref 0.44–1.00)
Creatinine, Ser: 0.78 mg/dL (ref 0.44–1.00)
Creatinine, Ser: 0.68 mg/dL (ref 0.44–1.00)
Creatinine, Ser: 0.66 mg/dL (ref 0.44–1.00)
GFR, Estimated: >60 mL/min (ref >60)
GFR, Estimated: >60 mL/min (ref >60)
GFR, Estimated: >60 mL/min (ref >60)
GFR, Estimated: >60 mL/min (ref >60)
Glucose, Bld: 121 mg/dL — ABNORMAL HIGH (ref 70–99)
Glucose, Bld: 126 mg/dL — ABNORMAL HIGH (ref 70–99)
Glucose, Bld: 133 mg/dL — ABNORMAL HIGH (ref 70–99)
Glucose, Bld: 108 mg/dL — ABNORMAL HIGH (ref 70–99)
Potassium: 2.4 mmol/L — CL (ref 3.5–5.1)
Potassium: 3.2 mmol/L — ABNORMAL LOW (ref 3.5–5.1)
Potassium: 3.8 mmol/L (ref 3.5–5.1)
Potassium: 3.5 mmol/L (ref 3.5–5.1)
Sodium: 126 mmol/L — ABNORMAL LOW (ref 135–145)
Sodium: 125 mmol/L — ABNORMAL LOW (ref 135–145)
Sodium: 126 mmol/L — ABNORMAL LOW (ref 135–145)
Sodium: 132 mmol/L — ABNORMAL LOW (ref 135–145)

## 2021-09-19 LAB — CBC
HCT: 30.7 % — ABNORMAL LOW (ref 36.0–46.0)
Hemoglobin: 11.4 g/dL — ABNORMAL LOW (ref 12.0–15.0)
MCH: 29.8 pg (ref 26.0–34.0)
MCHC: 37.1 g/dL — ABNORMAL HIGH (ref 30.0–36.0)
MCV: 80.4 fL (ref 80.0–100.0)
Platelets: 228 10*3/uL (ref 150–400)
RBC: 3.82 MIL/uL — ABNORMAL LOW (ref 3.87–5.11)
RDW: 17.1 % — ABNORMAL HIGH (ref 11.5–15.5)
WBC: 8.1 10*3/uL (ref 4.0–10.5)
nRBC: 0 % (ref 0.0–0.2)

## 2021-09-19 LAB — C DIFFICILE QUICK SCREEN W PCR REFLEX
C Diff antigen: NEGATIVE
C Diff interpretation: NOT DETECTED
C Diff toxin: NEGATIVE

## 2021-09-19 LAB — CK: Total CK: 171 U/L (ref 38–234)

## 2021-09-19 LAB — COMPREHENSIVE METABOLIC PANEL
ALT: 19 U/L (ref 0–44)
AST: 23 U/L (ref 15–41)
Albumin: 3.7 g/dL (ref 3.5–5.0)
Alkaline Phosphatase: 130 U/L — ABNORMAL HIGH (ref 38–126)
Anion gap: 10 (ref 5–15)
BUN: <5 mg/dL — ABNORMAL LOW (ref 8–23)
CO2: 25 mmol/L (ref 22–32)
Calcium: 8.9 mg/dL (ref 8.9–10.3)
Chloride: 92 mmol/L — ABNORMAL LOW (ref 98–111)
Creatinine, Ser: 0.7 mg/dL (ref 0.44–1.00)
GFR, Estimated: >60 mL/min (ref >60)
Glucose, Bld: 119 mg/dL — ABNORMAL HIGH (ref 70–99)
Potassium: 2.3 mmol/L — CL (ref 3.5–5.1)
Sodium: 127 mmol/L — ABNORMAL LOW (ref 135–145)
Total Bilirubin: 1.1 mg/dL (ref 0.3–1.2)
Total Protein: 6.6 g/dL (ref 6.5–8.1)

## 2021-09-19 LAB — CORTISOL-AM, BLOOD: Cortisol - AM: 16 ug/dL (ref 6.7–22.6)

## 2021-09-19 LAB — CREATININE, URINE, RANDOM: Creatinine, Urine: 16.86 mg/dL

## 2021-09-19 LAB — TROPONIN I (HIGH SENSITIVITY)
Troponin I (High Sensitivity): 9 ng/L (ref <18)
Troponin I (High Sensitivity): 9 ng/L (ref <18)

## 2021-09-19 LAB — MAGNESIUM: Magnesium: 2.1 mg/dL (ref 1.7–2.4)

## 2021-09-19 LAB — OSMOLALITY: Osmolality: 257 mOsm/kg — ABNORMAL LOW (ref 275–295)

## 2021-09-19 LAB — TSH: TSH: 0.957 u[IU]/mL (ref 0.350–4.500)

## 2021-09-19 LAB — PREALBUMIN: Prealbumin: 12.4 mg/dL — ABNORMAL LOW (ref 18–38)

## 2021-09-19 LAB — PHOSPHORUS: Phosphorus: 1.1 mg/dL — ABNORMAL LOW (ref 2.5–4.6)

## 2021-09-19 LAB — MRSA NEXT GEN BY PCR, NASAL: MRSA by PCR Next Gen: NOT DETECTED

## 2021-09-19 LAB — SODIUM, URINE, RANDOM: Sodium, Ur: 10 mmol/L

## 2021-09-19 LAB — OSMOLALITY, URINE: Osmolality, Ur: 94 mOsm/kg — ABNORMAL LOW (ref 300–900)

## 2021-09-19 MED ORDER — PANTOPRAZOLE SODIUM 40 MG PO TBEC
40.0000 mg | DELAYED_RELEASE_TABLET | Freq: Every day | ORAL | Status: DC
  Administered 2021-09-19 – 2021-09-23 (×5): 40 mg via ORAL
  Filled 2021-09-19 (×6): qty 1

## 2021-09-19 MED ORDER — METOCLOPRAMIDE HCL 5 MG/ML IJ SOLN
5.0000 mg | Freq: Four times a day (QID) | INTRAMUSCULAR | Status: DC | PRN
  Administered 2021-09-19 (×4): 5 mg via INTRAVENOUS
  Filled 2021-09-19 (×4): qty 2

## 2021-09-19 MED ORDER — POTASSIUM CHLORIDE CRYS ER 20 MEQ PO TBCR
40.0000 meq | EXTENDED_RELEASE_TABLET | Freq: Once | ORAL | Status: AC
  Administered 2021-09-19: 40 meq via ORAL
  Filled 2021-09-19: qty 2

## 2021-09-19 MED ORDER — SODIUM CHLORIDE 0.9 % IV SOLN: INTRAVENOUS | Status: DC

## 2021-09-19 MED ORDER — SODIUM PHOSPHATES 45 MMOLE/15ML IV SOLN
30.0000 mmol | Freq: Once | INTRAVENOUS | Status: AC
  Administered 2021-09-19: 30 mmol via INTRAVENOUS
  Filled 2021-09-19: qty 10

## 2021-09-19 MED ORDER — ACETAMINOPHEN 650 MG RE SUPP: 650.0000 mg | Freq: Four times a day (QID) | RECTAL | Status: DC | PRN

## 2021-09-19 MED ORDER — ORAL CARE MOUTH RINSE
15.0000 mL | Freq: Two times a day (BID) | OROMUCOSAL | Status: DC
  Administered 2021-09-19 – 2021-09-20 (×4): 15 mL via OROMUCOSAL

## 2021-09-19 MED ORDER — SERTRALINE HCL 100 MG PO TABS: 100.0000 mg | ORAL_TABLET | Freq: Once | ORAL | Status: DC

## 2021-09-19 MED ORDER — BENZTROPINE MESYLATE 0.5 MG PO TABS
1.0000 mg | ORAL_TABLET | Freq: Every day | ORAL | Status: DC
  Administered 2021-09-19 – 2021-09-23 (×5): 1 mg via ORAL
  Filled 2021-09-19 (×2): qty 2
  Filled 2021-09-19: qty 1
  Filled 2021-09-19 (×3): qty 2

## 2021-09-19 MED ORDER — SERTRALINE HCL 100 MG PO TABS
100.0000 mg | ORAL_TABLET | Freq: Once | ORAL | Status: AC
  Administered 2021-09-19: 100 mg via ORAL
  Filled 2021-09-19: qty 1

## 2021-09-19 MED ORDER — LOPERAMIDE HCL 2 MG PO CAPS
2.0000 mg | ORAL_CAPSULE | Freq: Two times a day (BID) | ORAL | Status: AC
Start: 2021-09-19 — End: 2021-09-21
  Administered 2021-09-19 – 2021-09-21 (×6): 2 mg via ORAL
  Filled 2021-09-19 (×6): qty 1

## 2021-09-19 MED ORDER — SODIUM CHLORIDE 0.9 % IV SOLN: INTRAVENOUS | Status: AC

## 2021-09-19 MED ORDER — ALBUTEROL SULFATE (2.5 MG/3ML) 0.083% IN NEBU: 2.5000 mg | INHALATION_SOLUTION | RESPIRATORY_TRACT | Status: DC | PRN

## 2021-09-19 MED ORDER — SODIUM CHLORIDE 0.9 % IV SOLN
2.0000 g | INTRAVENOUS | Status: DC
  Administered 2021-09-19 – 2021-09-21 (×3): 2 g via INTRAVENOUS
  Filled 2021-09-19 (×3): qty 20

## 2021-09-19 MED ORDER — CHLORHEXIDINE GLUCONATE CLOTH 2 % EX PADS
6.0000 | MEDICATED_PAD | Freq: Every day | CUTANEOUS | Status: DC
  Administered 2021-09-19 – 2021-09-20 (×2): 6 via TOPICAL

## 2021-09-19 MED ORDER — HYDROCODONE-ACETAMINOPHEN 5-325 MG PO TABS
1.0000 | ORAL_TABLET | ORAL | Status: DC | PRN
  Administered 2021-09-19 – 2021-09-22 (×6): 2 via ORAL
  Filled 2021-09-19 (×6): qty 2

## 2021-09-19 MED ORDER — ACETAMINOPHEN 325 MG PO TABS
650.0000 mg | ORAL_TABLET | Freq: Four times a day (QID) | ORAL | Status: DC | PRN
  Administered 2021-09-19 – 2021-09-22 (×4): 650 mg via ORAL
  Filled 2021-09-19 (×4): qty 2

## 2021-09-19 MED ORDER — POTASSIUM CHLORIDE 10 MEQ/100ML IV SOLN
10.0000 meq | INTRAVENOUS | Status: AC
  Administered 2021-09-19 (×3): 10 meq via INTRAVENOUS
  Filled 2021-09-19 (×3): qty 100

## 2021-09-19 MED ORDER — ONDANSETRON HCL 4 MG/2ML IJ SOLN
4.0000 mg | Freq: Four times a day (QID) | INTRAMUSCULAR | Status: AC | PRN
  Administered 2021-09-19: 4 mg via INTRAVENOUS
  Filled 2021-09-19: qty 2

## 2021-09-19 MED ORDER — SUCRALFATE 1 GM/10ML PO SUSP
1.0000 g | Freq: Three times a day (TID) | ORAL | Status: DC
  Administered 2021-09-19 – 2021-09-23 (×17): 1 g via ORAL
  Filled 2021-09-19 (×17): qty 10

## 2021-09-19 NOTE — H&P (Signed)
[name] EHU:[id number] DOB: Mar 30, 1956 DOA: 09/18/2021     PCP: Bartholome Bill, MD   Outpatient Specialists:     [facility]  [place]   Patient arrived to ER on 09/18/21 at 1922 Referred by Attending Toy Baker, MD   Patient coming from:    home Lives alone,        Chief Complaint:   Chief Complaint  Patient presents with   Emesis    HPI: [place] is a 66 y.o. female with medical history significant of PTSD schizoaffective disorder depression IBS, chronic unsteady gait hypertension pancreatitis  Tobacco abuse  Presented with   nausea vomiting diarrhea abdominal pain Presents with nausea vomiting for the past 4 days reports abdominal pain associated with this and feeling lightheaded Presented to [place]. She has been having Opti-Free diarrhea Rea bowel movements per day as well as 2 or 3 episodes of emesis.  Decreased p.o. intake no urinary complaints.  Denies alcohol no fever no chills no chest pain or shortness of breath she has been still lightheaded when she stands up and she thinks that she may have passed out few days ago.     She still smokes She drinks about 5 glasses a day  Patient reports she continues to smoke interested in quitting denies alcohol abuse Endorses dysuria  Regarding pertinent Chronic problems:       HTN on lisinopril Schizoaffective disorder on Cogentin and Geodon      While in ER:    Noted to have potassium down to 2.1 UA positive for nitrates magnesium down to 1.2 was ordered pain medication and Zofran as well as IV potassium and magnesium Sodium down to 116  Ordered  CT HEAD   NON acute    CTabd/pelvis -IMPRESSION: 1. Hepatic steatosis. 2. Evidence of prior cholecystectomy. 3. Sigmoid diverticulosis.    Following Medications were ordered in ER: Medications  ondansetron (ZOFRAN) injection 4 mg (4 mg Intravenous Not Given 09/18/21 2122)  potassium chloride 10 mEq in 100 mL IVPB (10 mEq  Intravenous New Bag/Given 09/19/21 0042)  0.9 %  sodium chloride infusion ( Intravenous New Bag/Given 09/18/21 2146)  0.9 %  sodium chloride infusion ( Intravenous New Bag/Given 09/18/21 2248)  0.9 %  sodium chloride infusion ( Intravenous New Bag/Given 09/18/21 2258)  ondansetron (ZOFRAN) injection 4 mg (4 mg Intravenous Given 09/18/21 2013)  morphine (PF) 4 MG/ML injection 4 mg (4 mg Intravenous Given 09/18/21 2013)  lactated ringers bolus 1,000 mL (0 mLs Intravenous Stopped 09/18/21 2153)  iohexol (OMNIPAQUE) 300 MG/ML solution 100 mL (100 mLs Intravenous Contrast Given 09/18/21 2049)  0.9 %  sodium chloride infusion ( Intravenous New Bag/Given 09/18/21 2140)  magnesium sulfate IVPB 2 g 50 mL (2 g Intravenous New Bag/Given 09/18/21 2249)       ED Triage Vitals  Enc Vitals Group     BP 09/18/21 1934 (!) 112/59     Pulse Rate 09/18/21 1934 69     Resp 09/18/21 1934 (!) 25     Temp 09/18/21 1937 97.7 F (36.5 C)     Temp Source 09/18/21 1937 Oral     SpO2 09/18/21 1934 98 %     Weight 09/18/21 1930 136 lb (61.7 kg)     Height 09/18/21 1930 '5\' 2"'$  (7.858 m)     Head Circumference --      Peak Flow --      Pain Score 09/18/21 1929 10     Pain Loc --  Pain Edu? --      Excl. in [place]? --   TMAX(24)@     _________________________________________ Significant initial  Findings: Abnormal Labs Reviewed  LIPASE, BLOOD - Abnormal; Notable for the following components:      Result Value   Lipase <10 (*)    All other components within normal limits  COMPREHENSIVE METABOLIC PANEL - Abnormal; Notable for the following components:   Sodium 116 (*)    Potassium 2.1 (*)    Chloride 79 (*)    CO2 21 (*)    Glucose, Bld 152 (*)    BUN 5 (*)    Alkaline Phosphatase 140 (*)    Anion gap 16 (*)    All other components within normal limits  CBC - Abnormal; Notable for the following components:   WBC 10.7 (*)    Hemoglobin 11.9 (*)    HCT 32.5 (*)    MCV 78.7 (*)    MCHC 36.6 (*)    RDW 17.2 (*)     All other components within normal limits  URINALYSIS, ROUTINE W REFLEX MICROSCOPIC - Abnormal; Notable for the following components:   Nitrite POSITIVE (*)    Leukocytes,Ua MODERATE (*)    Bacteria, UA RARE (*)    All other components within normal limits  MAGNESIUM - Abnormal; Notable for the following components:   Magnesium 1.2 (*)    All other components within normal limits     _________________________ Troponin  ordered ECG: Ordered Personally reviewed by me showing: HR : 63 Rhythm:   Sinus rhythm LVH with secondary repolarization abnormality new Prolonged QT  QTC 554   ______   The recent clinical data is shown below. Vitals:   09/18/21 2315 09/18/21 2330 09/18/21 2343 09/19/21 0050  BP: 128/64 132/62 132/62 (!) 142/52  Pulse: 67  61 72  Resp: (!) '21 20 20 '$ (!) 21  Temp:   97.7 F (36.5 C) 97.7 F (36.5 C)  TempSrc:    Oral  SpO2: 100%  100% 100%  Weight:      Height:          WBC     Component Value Date/Time   WBC 10.7 (H) 09/18/2021 1950   LYMPHSABS 2.1 07/21/2021 0134   LYMPHSABS 1.8 05/03/2017 0940   MONOABS 0.8 07/21/2021 0134   EOSABS 0.2 07/21/2021 0134   EOSABS 0.1 05/03/2017 0940   BASOSABS 0.1 07/21/2021 0134   BASOSABS 0.1 05/03/2017 0940      UA   evidence of UTI     Urine analysis:    Component Value Date/Time   COLORURINE YELLOW 09/18/2021 2300   APPEARANCEUR CLEAR 09/18/2021 2300   LABSPEC 1.011 09/18/2021 2300   PHURINE 6.5 09/18/2021 2300   GLUCOSEU NEGATIVE 09/18/2021 2300   HGBUR NEGATIVE 09/18/2021 2300   BILIRUBINUR NEGATIVE 09/18/2021 2300   KETONESUR NEGATIVE 09/18/2021 2300   PROTEINUR NEGATIVE 09/18/2021 2300   UROBILINOGEN 1.0 09/14/2008 2239   NITRITE POSITIVE (A) 09/18/2021 2300   LEUKOCYTESUR MODERATE (A) 09/18/2021 2300      _______________________________________________ Hospitalist was called for admission for   Hyponatremia  Hypokalemia  Hypomagnesemia  Dehydration    The following Work up has  been ordered so far:  Orders Placed This Encounter  Procedures   Urine Culture   CT ABDOMEN PELVIS W CONTRAST   CT Head Wo Contrast   Lipase, blood   Comprehensive metabolic panel   CBC   Urinalysis, Routine w reflex microscopic   Magnesium  Diet NPO time specified   Saline Lock IV, Maintain IV access (when placed in a treatment room)   Cardiac Monitoring   Consult to hospitalist   ED EKG   EKG 12-Lead   Place in observation (patient's expected length of stay will be less than 2 midnights)   Place in observation (patient's expected length of stay will be less than 2 midnights)     OTHER Significant initial  Findings:  labs showing:    Recent Labs  Lab 09/18/21 1950  NA 116*  K 2.1*  CO2 21*  GLUCOSE 152*  BUN 5*  CREATININE 0.78  CALCIUM 9.7  MG 1.2*    Cr    stable,   Lab Results  Component Value Date   CREATININE 0.78 09/18/2021   CREATININE 1.05 (H) 07/21/2021   CREATININE 0.63 05/24/2020    Recent Labs  Lab 09/18/21 1950  AST 23  ALT 18  ALKPHOS 140*  BILITOT 1.2  PROT 7.7  ALBUMIN 4.3   Lab Results  Component Value Date   CALCIUM 9.7 09/18/2021   PHOS 3.1 01/16/2020          Plt: Lab Results  Component Value Date   PLT 288 09/18/2021       Recent Labs  Lab 09/18/21 1950  WBC 10.7*  HGB 11.9*  HCT 32.5*  MCV 78.7*  PLT 288    HG/HCT   Down   from baseline see below    Component Value Date/Time   HGB 11.9 (L) 09/18/2021 1950   HGB 14.3 05/03/2017 0940   HCT 32.5 (L) 09/18/2021 1950   HCT 42.6 05/03/2017 0940   MCV 78.7 (L) 09/18/2021 1950   MCV 92 05/03/2017 0940      Recent Labs  Lab 09/18/21 1950  LIPASE <10*     DM  labs:  HbA1C: Recent Labs    08/29/21 1122  HGBA1C 5.6       CBG (last 3)  No results for input(s): "GLUCAP" in the last 72 hours.        Cultures:    Component Value Date/Time   SDES  01/08/2020 1802    IN/OUT CATH URINE Performed at [facility], [place]  [address], [place], [place] [address]    SPECREQUEST  01/08/2020 1802    NONE Performed at [facility], [place] [address], [place], [place] [address]    CULT  01/08/2020 1802    NO GROWTH Performed at [facility], [place] [address] [place], [place] [address]    REPTSTATUS 01/09/2020 FINAL 01/08/2020 1802     Radiological Exams on Admission: CT Head Wo Contrast  Result Date: 09/18/2021 CLINICAL DATA:  Head trauma. EXAM: CT HEAD WITHOUT CONTRAST TECHNIQUE: Contiguous axial images were obtained from the base of the skull through the vertex without intravenous contrast. RADIATION DOSE REDUCTION: This exam was performed according to the departmental dose-optimization program which includes automated exposure control, adjustment of the mA and/or kV according to patient size and/or use of iterative reconstruction technique. COMPARISON:  Head CT dated 07/20/2021. FINDINGS: Brain: Mild age-related atrophy and chronic microvascular ischemic changes. There is no acute intracranial hemorrhage. No mass effect or midline shift. No extra-axial fluid collection. Vascular: No hyperdense vessel or unexpected calcification. Skull: Normal. Negative for fracture or focal lesion. Sinuses/Orbits: No acute finding. Other: None IMPRESSION: 1. No acute intracranial pathology. 2. Mild age-related atrophy and chronic microvascular ischemic changes. Electronically Signed   By: Laren Everts.D.  On: 09/18/2021 22:36   CT ABDOMEN PELVIS W CONTRAST  Result Date: 09/18/2021 CLINICAL DATA:  Abdominal pain. EXAM: CT ABDOMEN AND PELVIS WITH CONTRAST TECHNIQUE: Multidetector CT imaging of the abdomen and pelvis was performed using the standard protocol following bolus administration of intravenous contrast. RADIATION DOSE REDUCTION: This exam was performed according to the departmental dose-optimization program which includes automated exposure control, adjustment of the mA and/or kV according to patient  size and/or use of iterative reconstruction technique. CONTRAST:  151m OMNIPAQUE IOHEXOL 300 MG/ML  SOLN COMPARISON:  July 21, 2021 FINDINGS: Lower chest: No acute abnormality. Hepatobiliary: There is diffuse fatty infiltration of the liver parenchyma. No focal liver abnormality is seen. Status post cholecystectomy. No biliary dilatation. Pancreas: Unremarkable. No pancreatic ductal dilatation or surrounding inflammatory changes. Spleen: Normal in size without focal abnormality. Adrenals/Urinary Tract: Adrenal glands are unremarkable. Kidneys are normal in size, without renal calculi or hydronephrosis. A 6.8 cm x 6.3 cm simple cyst is seen within the lower pole of the left kidney. No additional follow-up per imaging is recommended. Bladder is unremarkable. Stomach/Bowel: Stomach is within normal limits. Appendix appears normal. No evidence of bowel wall thickening, distention, or inflammatory changes. Noninflamed diverticula are seen throughout the sigmoid colon. Vascular/Lymphatic: Aortic atherosclerosis. No enlarged abdominal or pelvic lymph nodes. Reproductive: Uterus and bilateral adnexa are unremarkable. Other: No abdominal wall hernia or abnormality. No abdominopelvic ascites. Musculoskeletal: A metallic density intramedullary rod and compression screw device is seen within the proximal left femur. Degenerative changes are seen within the lumbar spine, most prominent at the level of L3-L4. IMPRESSION: 1. Hepatic steatosis. 2. Evidence of prior cholecystectomy. 3. Sigmoid diverticulosis. Aortic Atherosclerosis (ICD10-I70.0). Electronically Signed   By: TVirgina NorfolkM.D.   On: 09/18/2021 21:06   _______________________________________________________________________________________________________ Latest  Blood pressure (!) 142/52, pulse 72, temperature 97.7 F (36.5 C), temperature source Oral, resp. rate (!) 21, height '5\' 2"'$  (1.575 m), weight 61.7 kg, SpO2 100 %.   Vitals  labs and radiology  finding personally reviewed  Review of Systems:    Pertinent positives include:  fatigue,abdominal pain, nausea, vomiting, diarrhea,  Constitutional:  No weight loss, night sweats, Fevers, chills,  weight loss  HEENT:  No headaches, Difficulty swallowing,Tooth/dental problems,Sore throat,  No sneezing, itching, ear ache, nasal congestion, post nasal drip,  Cardio-vascular:  No chest pain, Orthopnea, PND, anasarca, dizziness, palpitations.no Bilateral lower extremity swelling  GI:  No heartburn, indigestion,  change in bowel habits, loss of appetite, melena, blood in stool, hematemesis Resp:  no shortness of breath at rest. No dyspnea on exertion, No excess mucus, no productive cough, No non-productive cough, No coughing up of blood.No change in color of mucus.No wheezing. Skin:  no rash or lesions. No jaundice GU:  no dysuria, change in color of urine, no urgency or frequency. No straining to urinate.  No flank pain.  Musculoskeletal:  No joint pain or no joint swelling. No decreased range of motion. No back pain.  Psych:  No change in mood or affect. No depression or anxiety. No memory loss.  Neuro: no localizing neurological complaints, no tingling, no weakness, no double vision, no gait abnormality, no slurred speech, no confusion  All systems reviewed and apart from [place] are negative _______________________________________________________________________________________________ Past Medical History:   Past Medical History:  Diagnosis Date   DDD (degenerative disc disease), lumbar    Depression    Diverticulosis    Dizziness    GERD (gastroesophageal reflux disease)    IBS (irritable bowel syndrome)  Internal hemorrhoid    2nd degree   Myocarditis ([place]) 2009   Osteoarthritis    Prolapsed internal hemorrhoids, grade 3 12/01/2015   Recurrent falls    Schizoaffective disorder ([place])    Tubular adenoma of colon 08/2011     Past Surgical History:  Procedure  Laterality Date   BREAST BIOPSY Right 12/27/2012   CHOLECYSTECTOMY     HEMORRHOID BANDING     INTRAMEDULLARY (IM) NAIL INTERTROCHANTERIC Left 01/24/2020   Procedure: INTRAMEDULLARY (IM) NAIL INTERTROCHANTRIC;  Surgeon: Leandrew Koyanagi, MD;  Location: [facility];  Service: Orthopedics;  Laterality: Left;    Social History:  Ambulatory   cane, walker     reports that she quit smoking about 2 years ago. Her smoking use included cigarettes. She has a 19.00 pack-year smoking history. She has never used smokeless tobacco. She reports that she does not drink alcohol and does not use drugs.     Family History:  Family History  Problem Relation Age of Onset   Suicidality Father    Depression Father    Suicidality Sister    Depression Sister    Suicidality Cousin    Suicidality Other    ______________________________________________________________________________________________ Allergies: Allergies  Allergen Reactions   Penicillins     Did it involve swelling of the face/tongue/throat, SOB, or low BP? N Did it involve sudden or severe rash/hives, skin peeling, or any reaction on the inside of your mouth or nose? Y Did you need to seek medical attention at a hospital or doctor's office? N When did it last happen? Several Years Ago      If all above answers are "NO", may proceed with cephalosporin use.      Sulfa Antibiotics Rash   Sulfur Rash     Prior to Admission medications   Medication Sig Start Date End Date Taking? Authorizing Provider  benztropine (COGENTIN) 1 MG tablet Take 1 tablet (1 mg total) by mouth daily. 08/25/21   [name], MD  clobetasol (TEMOVATE) 0.05 % external solution Apply 1 application. topically 2 (two) times daily. 12/13/19   [provider]  lisinopril (ZESTRIL) 20 MG tablet Take 20 mg by mouth daily.    [provider]  magnesium gluconate (MAGONATE) 500 MG tablet Take 500 mg by mouth 2 (two) times daily.    [provider]   omeprazole (PRILOSEC) 40 MG capsule Take 40 mg by mouth in the morning and at bedtime.    [provider]  oxycodone (OXY-IR) 5 MG capsule Take 5 mg by mouth every 12 (twelve) hours as needed.    [provider]  sertraline (ZOLOFT) 100 MG tablet TAKE 2 TABLETS(200 MG) BY MOUTH DAILY 08/25/21   [name], MD  sucralfate (CARAFATE) 1 GM/10ML suspension Take 10 mLs (1 g total) by mouth 4 (four) times daily -  with meals and at bedtime. Patient taking differently: Take 1 g by mouth 4 (four) times daily. 01/20/20 08/29/21  Donne Hazel, MD  traZODone (DESYREL) 100 MG tablet TAKE 2 TABLETS(200 MG) BY MOUTH AT BEDTIME AS NEEDED FOR SLEEP 08/25/21   [name], MD  Vitamin D, Ergocalciferol, (DRISDOL) 1.25 MG (50000 UNIT) CAPS capsule Take 50,000 Units by mouth once a week. 11/21/19   [provider]  ziprasidone (GEODON) 80 MG capsule Take 1 capsule (80 mg total) by mouth in the morning and at bedtime. 08/25/21   [name], MD    ___________________________________________________________________________________________________ Physical Exam:    09/19/2021   12:50 AM  09/18/2021   11:43 PM 09/18/2021   11:30 PM  Vitals with BMI  Systolic [phone]  Diastolic 52 62 62  Pulse 72 61      1. General:  in No  Acute distress    Chronically ill  -appearing 2. Psychological: Alert and   Oriented 3. Head/ENT:   Dry Mucous Membranes                          Head  traumatic bruise over right eye, neck supple                           Poor Dentition 4. SKIN:  decreased Skin turgor,  Skin clean Dry and intact no rash 5. Heart: Regular rate and rhythm no  Murmur, no Rub or gallop 6. Lungs: , no wheezes or crackles   7. Abdomen: Soft,  non-tender, Non distended   obese  bowel sounds present 8. Lower extremities: no clubbing, cyanosis, no  edema 9. Neurologically Grossly intact, moving all 4 extremities equally  10. MSK: Normal range of motion    Chart has  been reviewed  ______________________________________________________________________________________________  Assessment/Plan 66 y.o. female with medical history significant of PTSD schizoaffective disorder depression IBS, chronic unsteady gait hypertension pancreatitis  Tobacco abuse   Admitted for  Hyponatremia Hypokalemia Hypomagnesemia    Dehydration   Present on Admission:  Hyponatremia  HTN (hypertension)  Hypokalemia  Schizoaffective disorder, bipolar type ([place])  Dehydration  QT prolongation  UTI (urinary tract infection)     HTN (hypertension) Allow permissive hypertension for tonight  Hypokalemia Replace and recheck magnesium has been replaced in the ER as well  Hyponatremia Severe but in the setting of dehydration.  Obtain electrolytes follow serial B med every 6 hours rehydrate follow fluid status discussed with pharmacy if any medications could be contributing to hyponatremia.  Schizoaffective disorder, bipolar type ([place]) Resume home medications if able to tolerate and no contraindications we will discuss with pharmacy if any of them cause hyponatremia/QTc prolongation  Dehydration Will rehydrate with IV fluids probably will benefit from orthostatic vital signs in a.m.  QT prolongation - will monitor on tele avoid QT prolonging medications, rehydrate correct electrolytes   UTI (urinary tract infection)  - treat with Rocephin (Pen allergic but tolerated ancef in th past)       await results of urine culture and adjust antibiotic coverage as needed    Other plan as per orders.  DVT prophylaxis:  SCD      Code Status:    Code Status: Prior FULL CODE  as per patient   I had personally discussed CODE STATUS with patient    Family Communication:   Family not at  Bedside    Disposition Plan:     likely will need placement for rehabilitation                           Following barriers for discharge:                            Electrolytes corrected                                Anemia stable  Would benefit from PT/OT eval prior to DC  Ordered                                       Transition of care consulted                   Nutrition    consulted               Consults called: none if hyponatremia not improved will need nephrology consult  Admission status:  ED Disposition     ED Disposition  Admit   Condition  --   [employer] [facility]: [place] [[id number]]  Level of Care: Telemetry [5]  Admit to tele based on following criteria: Complex arrhythmia (Bradycardia/Tachycardia)  Interfacility transfer: Yes  May place patient in observation at [facility] or [place] if equivalent level of care is available:: Yes  Covid Evaluation: Asymptomatic - no recent exposure (last 10 days) testing not required  Diagnosis: Hyponatremia [[id number]]  Admitting Physician: Jackelyn Knife [[id number]]  Attending Physician: Jackelyn Knife [[id number]]           Obs    Level of care    progressive tele indefinitely please discontinue once patient no longer qualifies COVID-19 Labs     Critical   Patient is critically ill due to severe hyponatremia They are at high risk for life/limb threatening clinical deterioration requiring frequent reassessment and modifications of care.  Services provided include examination of the patient, review of relevant ancillary tests, prescription of lifesaving therapies, review of medications and prophylactic therapy.  Total critical care time excluding separately billable procedures: 60 Minutes.    Emary Zalar 09/19/2021, 1:55 AM    [facility]     after 2 AM please page floor coverage PA If 7AM-7PM, please contact the day team taking care of the patient using [email or web address]   Patient was evaluated in the context of the global COVID-19 pandemic, which necessitated consideration that the patient might be at risk for infection with the  SARS-CoV-2 virus that causes COVID-19. Institutional protocols and algorithms that pertain to the evaluation of patients at risk for COVID-19 are in a state of rapid change based on information released by regulatory bodies including the CDC and federal and state organizations. These policies and algorithms were followed during the patient's care.

## 2021-09-19 NOTE — Assessment & Plan Note (Signed)
Replace and recheck magnesium has been replaced in the ER as well

## 2021-09-19 NOTE — Subjective & Objective (Signed)
Presents with nausea vomiting for the past 4 days reports abdominal pain associated with this and feeling lightheaded Presented to [place]. She has been having Opti-Free diarrhea Rea bowel movements per day as well as 2 or 3 episodes of emesis.  Decreased p.o. intake no urinary complaints.  Denies alcohol no fever no chills no chest pain or shortness of breath she has been still lightheaded when she stands up and she thinks that she may have passed out few days ago.

## 2021-09-19 NOTE — Assessment & Plan Note (Signed)
Resume home medications if able to tolerate and no contraindications we will discuss with pharmacy if any of them cause hyponatremia/QTc prolongation

## 2021-09-19 NOTE — Assessment & Plan Note (Signed)
-   treat with Rocephin (Pen allergic but tolerated ancef in th past)       await results of urine culture and adjust antibiotic coverage as needed

## 2021-09-19 NOTE — Evaluation (Signed)
Physical Therapy Evaluation Patient Details Name: ARACELY RICKETT MRN: [id number] DOB: 06-28-1955 Today's Date: 09/19/2021  History of Present Illness  66 yo female admitted with N/V/D, hyponatremia, hypokalemia, abd pain. hx of pancreatitis, L hip IM nail 2021, PTSD, schizophrenia  Clinical Impression  On eval, pt was Min A for mobility. She took a few steps in the room with a RW. Pt c/o LE spasms and shakiness. Assisted pt onto bsc then into recliner. Encouraged her to sit up in recliner as tolerated. Discussed d/c plan-pt is hopeful that she cane return home. Will recommend HHPT f/u. Will plan to follow pt during this hospital stay.        Recommendations for follow up therapy are one component of a multi-disciplinary discharge planning process, led by the attending physician.  Recommendations may be updated based on patient status, additional functional criteria and insurance authorization.  Follow Up Recommendations Home health PT    Assistance Recommended at Discharge PRN  Patient can return home with the following       Equipment Recommendations None recommended by PT  Recommendations for Other Services       Functional Status Assessment Patient has had a recent decline in their functional status and demonstrates the ability to make significant improvements in function in a reasonable and predictable amount of time.     Precautions / Restrictions Precautions Precautions: Fall Restrictions Weight Bearing Restrictions: No      Mobility  Bed Mobility Overal bed mobility: Needs Assistance Bed Mobility: Supine to Sit     Supine to sit: Supervision     General bed mobility comments: Supv for safety. Increased time.    Transfers Overall transfer level: Needs assistance Equipment used: (P) Rolling walker (2 wheels) Transfers: Sit to/from Stand, Bed to chair/wheelchair/BSC Sit to Stand: Min assist Stand pivot transfers: Min assist         General transfer  comment: Assist to steady. Increased time.    Ambulation/Gait  Min assist Gait Distance (Feet): 7 Feet Assistive device: Rolling walker (2 wheels) Gait Pattern/deviations: Step-through pattern, Decreased stride length       General Gait Details: Assist to steady. Pt c/o LE spasms and legs feeling shaky.  Stairs            Wheelchair Mobility    Modified Rankin (Stroke Patients Only)       Balance Overall balance assessment: Needs assistance         Standing balance support: During functional activity Standing balance-Leahy Scale: Poor                               Pertinent Vitals/Pain Pain Assessment Pain Assessment: Faces Faces Pain Scale: Hurts even more Pain Location: abdomen Pain Descriptors / Indicators: Aching Pain Intervention(s): Limited activity within patient's tolerance, Monitored during session    Home Living Family/patient expects to be discharged to:: Private residence Living Arrangements: Alone               Home Equipment: Conservation officer, nature (2 wheels);Wheelchair - manual      Prior Function Prior Level of Function : Independent/Modified Independent             Mobility Comments: uses RW vs WC (mostly)       Hand Dominance        Extremity/Trunk Assessment   Upper Extremity Assessment Upper Extremity Assessment: Defer to OT evaluation    Lower Extremity Assessment Lower Extremity  Assessment: Generalized weakness    Cervical / Trunk Assessment Cervical / Trunk Assessment: Normal  Communication   Communication: No difficulties  Cognition Arousal/Alertness: Awake/alert Behavior During Therapy: WFL for tasks assessed/performed Overall Cognitive Status: Within Functional Limits for tasks assessed                                 General Comments: a bit soft spoken        General Comments      Exercises     Assessment/Plan    PT Assessment Patient needs continued PT services  PT  Problem List Decreased strength;Decreased activity tolerance;Decreased balance;Decreased knowledge of use of DME;Decreased mobility;Pain       PT Treatment Interventions DME instruction;Gait training;Therapeutic activities;Therapeutic exercise;Patient/family education;Functional mobility training;Balance training    PT Goals (Current goals can be found in the Care Plan section)  Acute Rehab PT Goals Patient Stated Goal: less pain. to be able to eat normal diet PT Goal Formulation: With patient Time For Goal Achievement: 10/03/21 Potential to Achieve Goals: Good    Frequency Min 3X/week     Co-evaluation               AM-PAC PT "6 Clicks" Mobility  Outcome Measure Help needed turning from your back to your side while in a flat bed without using bedrails?: A Little Help needed moving from lying on your back to sitting on the side of a flat bed without using bedrails?: A Little Help needed moving to and from a bed to a chair (including a wheelchair)?: A Little Help needed standing up from a chair using your arms (e.g., wheelchair or bedside chair)?: A Little Help needed to walk in hospital room?: A Little Help needed climbing 3-5 steps with a railing? : A Lot 6 Click Score: 17    End of Session   Activity Tolerance: Patient limited by fatigue Patient left: in chair;with call bell/phone within reach;with chair alarm set   PT Visit Diagnosis: Unsteadiness on feet (R26.81);Muscle weakness (generalized) (M62.81);Difficulty in walking, not elsewhere classified (R26.2)    Time: 3810-1751 PT Time Calculation (min) (ACUTE ONLY): 29 min   Charges:   PT Evaluation $PT Eval Moderate Complexity: 1 Mod PT Treatments $Gait Training: 8-22 mins          Doreatha Massed, PT Acute Rehabilitation  Office: [phone] Pager: [phone]

## 2021-09-19 NOTE — Assessment & Plan Note (Signed)
Allow permissive hypertension for tonight 

## 2021-09-19 NOTE — Assessment & Plan Note (Addendum)
Severe but in the setting of dehydration.  Obtain electrolytes follow serial BMET 6 hours rehydrate follow fluid status discussed with pharmacy if any medications could be contributing to hyponatremia.  obtain urine electrolytes Check TSH and  obtain CXR Check AM cortisol level

## 2021-09-19 NOTE — Assessment & Plan Note (Signed)
-   will monitor on tele avoid QT prolonging medications, rehydrate correct electrolytes ? ?

## 2021-09-19 NOTE — Progress Notes (Signed)
PROGRESS NOTE  JUSTYCE YEATER  DOB: 28-Apr-1955  PCP: Bartholome Bill, MD ZOX:[id number]  DOA: 09/18/2021  LOS: 0 days  Hospital Day: 2  Brief narrative: [name] is a 66 y.o. female with PMH significant for PTSD, schizoaffective disorder, depression, IBS, chronic unsteady gait, HTN, pancreatitis, continues to smoke and drink alcohol Patient presented to the ED on 6/11 with complaint of nausea, vomiting, diarrhea, abdominal pain for past 4 days with poor oral intake and lightheadedness.  In the ED, patient was hemodynamically stable. Labs showed sodium level significantly low at 116, potassium low at 2.1, magnesium low at 1.2, WBC elevated 10.7, hemoglobin 11.9, Urinalysis with clear yellow urine, moderate amount of leukocytes, positive nitrite, rare bacteria, urine osmolality low at 94 CT abdomen pelvis did not show any acute intra-abdominal finding, showed hepatic steatosis, sigmoid diverticulosis.  Subjective: Patient was seen and examined this morning.  Elderly Caucasian female.  Lying down in bed.  Drowsy from the effect of nausea medicine given earlier. Per RN, patient is still having watery stool.  C. difficile negative.  Isolation removed. Last set of labs this afternoon with sodium level still low at 126.  Principal Problem:   Hyponatremia Active Problems:   HTN (hypertension)   Hypokalemia   Schizoaffective disorder, bipolar type (HCC)   Dehydration   QT prolongation   UTI (urinary tract infection)    Assessment and plan: Acute gastroenteritis -Presented with 4 days of nausea, vomiting, diarrhea, abdominal pain with subsequent dehydration -CT abdomen on admission did not show any acute intra-abdominal finding -C. difficile assay and GI pathogen panel negative. -Continues to have watery diarrhea, nausea.  Last vomiting was yesterday. -Currently on clear liquid diet. -Continue as needed IV antiemetics.  Start on Imodium 2 mg scheduled twice daily for next  3 days. -Continue to monitor for diarrhea.  Flexi-Seal may help.  UTI -Positive nitrite, moderate leukocytes, rare bacteria -Currently on IV Rocephin. -Pending culture report  Essential hypertension -PTA on lisinopril 20 mg daily.  Currently on hold.  Hypokalemia/hypomagnesemia/hypophosphatemia -Significantly low level of electrolytes because of GI loss.   -Potassium and phosphorus level low this morning.  Replacement given.  Magnesium level improved with replacement.   -And recheck labs again tomorrow Recent Labs  Lab 09/18/21 1950 09/19/21 0144 09/19/21 0313 09/19/21 0846 09/19/21 1235  K 2.1* 2.3* 2.4* 3.2* 3.8  MG 1.2* 2.1  --   --   --   PHOS  --  1.1*  --   --   --     Acute severe hyponatremia -Sodium level significantly low at 116 on presentation.  Probably hypovolemic hyponatremia due to poor intake and vomiting.  Improving on IV fluid.  Continue the same. Recent Labs  Lab 09/18/21 1950 09/19/21 0144 09/19/21 0313 09/19/21 0846 09/19/21 1235  NA 116* 127* 126* 125* 126*   Schizoaffective disorder, bipolar type -QTc was 496 ms. -Expect improvement with electrolyte correction.  Repeat EKG tomorrow.  Dementia -PTA, on benztropine 1 mg daily, Geodon 80 mg twice daily, trazodone 200 mg at bedtime, sertraline 200 mg daily, oxycodone 5 mg twice daily -Currently on mood altering medications are on hold. -Continue monitor symptoms  GERD -Continue Prilosec, Carafate   Goals of care   Code Status: Full Code    Mobility: Encourage ambulation.  May benefit from PT  Skin assessment:     Nutritional status:  Body mass index is 28.91 kg/m.          Diet:  Diet Order  Diet clear liquid Room service appropriate? Yes; Fluid consistency: Thin  Diet effective now                   DVT prophylaxis:  SCDs Start: 09/19/21 0108   Antimicrobials: Currently on IV Rocephin Fluid: Restart NS at 100/h Consultants: None Family Communication:  None at bedside  Status is: Inpatient  Continue in-hospital care because: Needs monitoring for further diarrhea, needs IV fluid and electrolyte monitoring and replacement Level of care: Telemetry   Dispo: The patient is from: Home              Anticipated d/c is to: Pending clinical course              Patient currently is not medically stable to d/c.   Difficult to place patient No     Infusions:   sodium chloride 10 mL/hr at 09/19/21 1500   sodium chloride Stopped (09/18/21 2351)   sodium chloride Stopped (09/19/21 0019)   cefTRIAXone (ROCEPHIN)  IV Stopped (09/19/21 0359)   sodium phosphate 30 mmol in dextrose 5 % 250 mL infusion 43 mL/hr at 09/19/21 1500    Scheduled Meds:  benztropine  1 mg Oral Daily   Chlorhexidine Gluconate Cloth  6 each Topical Daily   loperamide  2 mg Oral BID   mouth rinse  15 mL Mouth Rinse BID   pantoprazole  40 mg Oral Daily   sucralfate  1 g Oral TID AC & HS    PRN meds: sodium chloride, sodium chloride, sodium chloride, acetaminophen **OR** acetaminophen, albuterol, HYDROcodone-acetaminophen, metoCLOPramide (REGLAN) injection   Antimicrobials: Anti-infectives (From admission, onward)    Start     Dose/Rate Route Frequency Ordered Stop   09/19/21 0200  cefTRIAXone (ROCEPHIN) 2 g in sodium chloride 0.9 % 100 mL IVPB        2 g 200 mL/hr over 30 Minutes Intravenous Every 24 hours 09/19/21 0116         Objective: Vitals:   09/19/21 1000 09/19/21 1100  BP: 97/61 106/63  Pulse: 81 (!) 54  Resp: 17 18  Temp:    SpO2: (!) 89% 95%    Intake/Output Summary (Last 24 hours) at 09/19/2021 1539 Last data filed at 09/19/2021 1500 Gross per 24 hour  Intake 2873.76 ml  Output 350 ml  Net 2523.76 ml   Filed Weights   09/18/21 1930 09/19/21 0300  Weight: 61.7 kg 71.7 kg   Weight change:  Body mass index is 28.91 kg/m.   Physical Exam: General exam: Pleasant, elderly Caucasian female. Skin: No rashes, lesions or ulcers. HEENT:  Atraumatic, normocephalic, no obvious bleeding Lungs: Clear to auscultation bilaterally CVS: Regular rate and rhythm, no murmur GI/Abd soft, mildly patient alert, none distended, bowel sound present CNS: Drowsy from the effect of nausea medicines given earlier Psychiatry: Sad affect Extremities: No pedal edema, no calf tenderness  Data Review: I have personally reviewed the laboratory data and studies available.  F/u labs ordered Unresulted Labs (From admission, onward)     Start     Ordered   09/20/21 0500  CBC with Differential/Platelet  Tomorrow morning,   R       Question:  Specimen collection method  Answer:  Lab=Lab collect   09/19/21 0753   09/20/21 0500  Magnesium  Tomorrow morning,   R       Question:  Specimen collection method  Answer:  Lab=Lab collect   09/19/21 0753   09/20/21 0500  Phosphorus  Tomorrow  morning,   R       Question:  Specimen collection method  Answer:  Lab=Lab collect   09/19/21 0753   09/19/21 6808  Basic metabolic panel  Now then every 4 hours,   R      09/19/21 0159   09/19/21 0121  Gastrointestinal Panel by PCR , Stool  (Gastrointestinal Panel by PCR, Stool                                                                                                                                                     **Does Not include CLOSTRIDIUM DIFFICILE testing. **If CDIFF testing is needed, place order from the "C Difficile Testing" order set.**)  Once,   R        09/19/21 0120   09/18/21 2330  Urine Culture  Once,   R       Question:  Indication  Answer:  Dysuria   09/18/21 2329            Signed, Terrilee Croak, MD [facility] 09/19/2021

## 2021-09-19 NOTE — Assessment & Plan Note (Signed)
Will rehydrate with IV fluids probably will benefit from orthostatic vital signs in a.m. 

## 2021-09-19 NOTE — Progress Notes (Signed)
OT Cancellation Note  Patient Details Name: [name] MRN: [id number] DOB: October 13, 1955   Cancelled Treatment:    Reason Eval/Treat Not Completed: Patient not medically ready Patient's potassium is 2.4. OT to continue to follow and check back as schedule will allow.   Jackelyn Poling OTR/L, [place] Acute Rehabilitation Department Office# [phone] Pager# [phone]  09/19/2021, 6:45 AM

## 2021-09-19 NOTE — Plan of Care (Signed)
Discussed with patient plan of care for the evening, pain management and admission/transfer questions to ICU as overflow with some teach back displayed  Problem: Education: Goal: Knowledge of General Education information will improve Description: Including pain rating scale, medication(s)/side effects and non-pharmacologic comfort measures Outcome: Progressing

## 2021-09-19 NOTE — Progress Notes (Signed)
Patient came to 59 from [place]. Alert and Oriented x 4. Pt. Had increased work of breathing, started c/o nausea. Patient had sodium of 116 and potassium of 2.1 on admission. Stat EKG obtained showing Sinus Loletha Grayer and prolonged QT.IV runs of potassium continued per MD order The admitting  MD referred the patient to a higher level of care as the patient needed close monitoring and frequent interventions for critical labs.  Patient was transferred to room 1239 with her belongings and home wheelchair.

## 2021-09-20 DIAGNOSIS — E871 Hypo-osmolality and hyponatremia: Secondary | ICD-10-CM | POA: Diagnosis not present

## 2021-09-20 LAB — CBC WITH DIFFERENTIAL/PLATELET
Abs Immature Granulocytes: 0.07 10*3/uL (ref 0.00–0.07)
Basophils Absolute: 0 10*3/uL (ref 0.0–0.1)
Basophils Relative: 1 %
Eosinophils Absolute: 0.1 10*3/uL (ref 0.0–0.5)
Eosinophils Relative: 2 %
HCT: 29 % — ABNORMAL LOW (ref 36.0–46.0)
Hemoglobin: 10 g/dL — ABNORMAL LOW (ref 12.0–15.0)
Immature Granulocytes: 1 %
Lymphocytes Relative: 18 %
Lymphs Abs: 1.1 10*3/uL (ref 0.7–4.0)
MCH: 29.8 pg (ref 26.0–34.0)
MCHC: 34.5 g/dL (ref 30.0–36.0)
MCV: 86.3 fL (ref 80.0–100.0)
Monocytes Absolute: 0.4 10*3/uL (ref 0.1–1.0)
Monocytes Relative: 7 %
Neutro Abs: 4.4 10*3/uL (ref 1.7–7.7)
Neutrophils Relative %: 71 %
Platelets: 200 10*3/uL (ref 150–400)
RBC: 3.36 MIL/uL — ABNORMAL LOW (ref 3.87–5.11)
RDW: 18.4 % — ABNORMAL HIGH (ref 11.5–15.5)
WBC: 6.2 10*3/uL (ref 4.0–10.5)
nRBC: 0 % (ref 0.0–0.2)

## 2021-09-20 LAB — GASTROINTESTINAL PANEL BY PCR, STOOL (REPLACES STOOL CULTURE)

## 2021-09-20 LAB — PHOSPHORUS: Phosphorus: 3 mg/dL (ref 2.5–4.6)

## 2021-09-20 LAB — MAGNESIUM: Magnesium: 1.9 mg/dL (ref 1.7–2.4)

## 2021-09-20 MED ORDER — ALUM & MAG HYDROXIDE-SIMETH 200-200-20 MG/5ML PO SUSP
15.0000 mL | ORAL | Status: DC | PRN
Start: 2021-09-20 — End: 2021-09-23
  Administered 2021-09-20: 15 mL via ORAL
  Filled 2021-09-20: qty 30

## 2021-09-20 MED ORDER — SERTRALINE HCL 100 MG PO TABS
100.0000 mg | ORAL_TABLET | Freq: Once | ORAL | Status: AC
Start: 2021-09-20 — End: 2021-09-20
  Administered 2021-09-20: 100 mg via ORAL
  Filled 2021-09-20: qty 1

## 2021-09-20 MED ORDER — ONDANSETRON HCL 4 MG/2ML IJ SOLN
4.0000 mg | Freq: Three times a day (TID) | INTRAMUSCULAR | Status: AC | PRN
  Administered 2021-09-21: 4 mg via INTRAVENOUS
  Filled 2021-09-20: qty 2

## 2021-09-20 MED ORDER — ADULT MULTIVITAMIN W/MINERALS CH
1.0000 | ORAL_TABLET | Freq: Every day | ORAL | Status: DC
  Administered 2021-09-20 – 2021-09-23 (×4): 1 via ORAL
  Filled 2021-09-20 (×4): qty 1

## 2021-09-20 MED ORDER — BOOST / RESOURCE BREEZE PO LIQD CUSTOM
1.0000 | Freq: Two times a day (BID) | ORAL | Status: DC
  Administered 2021-09-20 – 2021-09-23 (×3): 1 via ORAL

## 2021-09-20 MED ORDER — METOCLOPRAMIDE HCL 5 MG/ML IJ SOLN: 5.0000 mg | Freq: Three times a day (TID) | INTRAMUSCULAR | Status: DC | PRN

## 2021-09-20 NOTE — TOC Progression Note (Signed)
Transition of Care [facility]) - Progression Note    Patient Details  Name: [name] MRN: [id number] Date of Birth: 1956/01/08  Transition of Care [facility]) CM/SW [place], LCSW Phone Number: 09/20/2021, 12:50 PM  Clinical Narrative:    Met with pt to discuss recommendations for HHPT/OT. Pt states that she does not want HHPT/OT. She states she is a homebody and does not want people coming to her house. CSW asked pt if she would feel comfortable with OPPT in which pt also declined this stating she does not feel that she needs it. CSW offered encouragement and reiterated recommendation however, pt continued to decline all PT/OT follow up. No further TOC needs identified at this time. Please consult TOC if further needs arise.    Expected Discharge Plan: Home/Self Care Barriers to Discharge: Continued Medical Work up  Expected Discharge Plan and Services Expected Discharge Plan: Home/Self Care     Post Acute Care Choice: NA Living arrangements for the past 2 months: Single Family Home                 DME Arranged: N/A                     Social Determinants of Health (SDOH) Interventions    Readmission Risk Interventions     No data to display

## 2021-09-20 NOTE — Progress Notes (Signed)
Initial Nutrition Assessment  DOCUMENTATION CODES:   Not applicable  INTERVENTION:  - will order Boost Breeze BID, each supplement provides 250 kcal and 9 grams of protein. - will order 1 tablet multivitamin with minerals/day. - diet advancement as medically feasible.    NUTRITION DIAGNOSIS:   Inadequate oral intake related to acute illness, nausea, diarrhea as evidenced by per patient/family report.  GOAL:   Patient will meet greater than or equal to 90% of their needs  MONITOR:   PO intake, Supplement acceptance, Diet advancement, Labs, Weight trends  REASON FOR ASSESSMENT:   Consult Assessment of nutrition requirement/status  ASSESSMENT:   66 y.o. female with medical history of PTSD, schizoaffective disorder, depression, IBS, chronic unsteady gait, HTN, pancreatitis, and ongoing alcohol (5 glasses/day) and tobacco abuse. Patient presented to the ED due to N/V/D and abdominal pain x4 days and associated lightheadedness.  Patient laying in bed. A close friend was at bedside when RD arrived but he needed to leave. Patient shares that this man and his wife are previous next door neighbors of hers and have been very helpful and kind to her.  Patient's diet advanced from CLD to [place] today at 1123. Patient had consumed 50% of a cup of jello and a cup of juice this afternoon. She reports ongoing abdominal pain, nausea, and diarrhea. This is limiting PO intake.   Patient shares that she is very tired and during brief RD visit she started to fall asleep twice.   Weight yesterday was 158 lb and weight on 08/29/21 was 158 lb. Weight appears to be stable since 05/2020.  Per notes: - acute gastroenteritis - UTI - severe hyponatremia on admission - dementia   Labs reviewed; Na: 132 mmol/l, BUN: <5 mg/dl, Ca: 8.4 mg/dl.  Medications reviewed; 2 mg imodium BID, 40 mg oral protonix/day, 30 mmol IV NaPhos x1 run 6/12, 1 g carafate TID.  IVF; NS @ 100 ml/hr.     NUTRITION - FOCUSED  PHYSICAL EXAM:  Flowsheet Row Most Recent Value  Orbital Region No depletion  Upper Arm Region No depletion  Thoracic and Lumbar Region Unable to assess  Buccal Region No depletion  Temple Region No depletion  Clavicle Bone Region No depletion  Clavicle and Acromion Bone Region No depletion  Scapular Bone Region No depletion  Dorsal Hand No depletion  Patellar Region No depletion  Anterior Thigh Region No depletion  Posterior Calf Region No depletion  Edema (RD Assessment) Mild  [BLE]  Hair Reviewed  Eyes Reviewed  Mouth Reviewed  Skin Reviewed  Nails Reviewed       Diet Order:   Diet Order             Diet full liquid Room service appropriate? Yes; Fluid consistency: Thin  Diet effective now                   EDUCATION NEEDS:   Not appropriate for education at this time  Skin:  Skin Assessment: Reviewed RN Assessment  Last BM:  6/13 (type 6 x1, medium amount)  Height:   Ht Readings from Last 1 Encounters:  09/19/21 '5\' 2"'$  (1.575 m)    Weight:   Wt Readings from Last 1 Encounters:  09/19/21 71.7 kg     BMI:  Body mass index is 28.91 kg/m.  Estimated Nutritional Needs:  Kcal:  1800-2000 kcal Protein:  90-100 grams Fluid:  >/= 2 L/day     Jarome Matin, MS, RD, LDN Registered Dietitian II Inpatient Clinical  Nutrition RD pager # and on-call/weekend pager # available in [facility]

## 2021-09-20 NOTE — Evaluation (Addendum)
Occupational Therapy Evaluation [name] Details Name: Joyce LEMMONS MRN: [id number] DOB: 03/01/1956 Today's Date: 09/20/2021   History of Present Illness 66 yo female admitted with N/V/D, hyponatremia, hypokalemia, abd pain. hx of pancreatitis, L hip IM nail 2021, PTSD, schizophrenia   Clinical Impression   [name] is a 66 year old female who was admitted for above.[name] reported living at home alone at baseline. [name] was noted to have decreased functional activity tolernace, decreased BUE strength, decreased endurance, decreased standing balanced, decreased safety awareness, and decreased knowledge of AE/AD impacting participation in ADLs. [name] would continue to benefit from skilled OT services at this time while admitted and after d/c to address noted deficits in order to improve overall safety and independence in ADLs.         Recommendations for follow up therapy are one component of a multi-disciplinary discharge planning process, led by the attending physician.  Recommendations may be updated based on [name] status, additional functional criteria and insurance authorization.   Follow Up Recommendations  Home health OT    Assistance Recommended at Discharge Frequent or constant Supervision/Assistance  [name] can return home with the following Assistance with cooking/housework;Direct supervision/assist for financial management;Assist for transportation;Help with stairs or ramp for entrance;Direct supervision/assist for medications management    Functional Status Assessment  [name] has had a recent decline in their functional status and demonstrates the ability to make significant improvements in function in a reasonable and predictable amount of time.  Equipment Recommendations  None recommended by OT    Recommendations for Other Services       Precautions / Restrictions Precautions Precautions: Fall Restrictions Weight Bearing Restrictions: No      Mobility  Bed Mobility Overal bed mobility: Needs Assistance Bed Mobility: Supine to Sit, Sit to Supine     Supine to sit: Supervision Sit to supine: Supervision        Transfers                          Balance Overall balance assessment: Needs assistance Sitting-balance support: No upper extremity supported, Feet supported Sitting balance-Leahy Scale: Good     Standing balance support: During functional activity Standing balance-Leahy Scale: Fair Standing balance comment: standing to complete hygiene tasks.                           ADL either performed or assessed with clinical judgement   ADL Overall ADL's : Needs assistance/impaired Eating/Feeding: Set up;Sitting Eating/Feeding Details (indicate cue type and reason): [name] needed help to open small containers. [name] able to complete clear liquid meal with less than 5% spillage noted. Grooming: Dance movement psychotherapist;Set up;Wash/dry hands;Sitting Grooming Details (indicate cue type and reason): on EOB Upper Body Bathing: Set up;Sitting Upper Body Bathing Details (indicate cue type and reason): EOB Lower Body Bathing: Min guard;Sitting/lateral leans;Sit to/from stand   Upper Body Dressing : Set up;Sitting Upper Body Dressing Details (indicate cue type and reason): with increased time for line management Lower Body Dressing: Minimal assistance;Sit to/from stand   Toilet Transfer: Min guard;Rolling walker (2 wheels) Toilet Transfer Details (indicate cue type and reason): with increased time to transfer from edge of bed to 3 in 1 commode with cues for proper positioning. Toileting- Water quality scientist and Hygiene: Min guard;Sit to/from stand       Functional mobility during ADLs: Min guard;Rolling walker (2 wheels)       Vision [name] Visual Report: No  change from baseline       Perception     Praxis      Pertinent Vitals/Pain Pain Assessment Pain Assessment: No/denies pain     Hand Dominance  Right   Extremity/Trunk Assessment Upper Extremity Assessment Upper Extremity Assessment: Generalized weakness (not able to tolerate MMT at this time can hold BUE over 90 degrees FF against gravity over 1 min)   Lower Extremity Assessment Lower Extremity Assessment: Defer to PT evaluation   Cervical / Trunk Assessment Cervical / Trunk Assessment: Normal   Communication Communication Communication: No difficulties   Cognition Arousal/Alertness: Awake/alert Behavior During Therapy: WFL for tasks assessed/performed Overall Cognitive Status: Within Functional Limits for tasks assessed                                 General Comments: a bit soft spoken.     General Comments  [name] was educated on sititng up out of bed for strength. [name] declined requesting multiple times to get back into bed.    Exercises     Shoulder Instructions      Home Living Family/[name] expects to be discharged to:: Private residence Living Arrangements: Alone   Type of Home: House                       Home Equipment: Conservation officer, nature (2 wheels);Wheelchair - manual          Prior Functioning/Environment Prior Level of Function : Independent/Modified Independent             Mobility Comments: uses RW vs WC (mostly) ADLs Comments: [name] reported being independent at home wth ADLs. [name] reported " the man that brough tme here can help" when asked about assistance at home if needed        OT Problem List: Decreased activity tolerance;Impaired balance (sitting and/or standing);Decreased safety awareness;Decreased knowledge of precautions;Decreased knowledge of use of DME or AE      OT Treatment/Interventions: Self-care/ADL training;Therapeutic exercise;Neuromuscular education;Energy conservation;DME and/or AE instruction;Therapeutic activities;Balance training;[name]/family education    OT Goals(Current goals can be found in the care plan section) Acute Rehab  OT Goals [name] Stated Goal: to get back in bed OT Goal Formulation: With [name] Time For Goal Achievement: 10/04/21 Potential to Achieve Goals: Good ADL Goals Pt Will Perform Lower Body Dressing: with modified independence;sit to/from stand Pt Will Transfer to Toilet: with modified independence;ambulating;regular height toilet Pt Will Perform Toileting - Clothing Manipulation and hygiene: with modified independence;sit to/from stand Additional ADL Goal #1: [name] will perform 10 min functional activity or exercise activity as evidence of improving activity tolerance  OT Frequency: Min 2X/week    Co-evaluation              AM-PAC OT "6 Clicks" Daily Activity     Outcome Measure Help from another person eating meals?: A Little Help from another person taking care of personal grooming?: A Little Help from another person toileting, which includes using toliet, bedpan, or urinal?: A Little Help from another person bathing (including washing, rinsing, drying)?: A Little Help from another person to put on and taking off regular upper body clothing?: A Little Help from another person to put on and taking off regular lower body clothing?: A Little 6 Click Score: 18   End of Session Equipment Utilized During Treatment: Rolling walker (2 wheels) Nurse Communication: Other (comment) (ok to participate in therapy.)  Activity Tolerance:  [name] tolerated treatment well [name] left: in bed;with call bell/phone within reach;with bed alarm set  OT Visit Diagnosis: Unsteadiness on feet (R26.81);Other abnormalities of gait and mobility (R26.89);Muscle weakness (generalized) (M62.81)                Time: 1610-9604 OT Time Calculation (min): 23 min Charges:  OT General Charges $OT Visit: 1 Visit OT Evaluation $OT Eval Low Complexity: 1 Low OT Treatments $Self Care/Home Management : 8-22 mins  Jackelyn Poling OTR/L, MS Acute Rehabilitation Department Office# [phone] Pager#  [phone]   Marcellina Millin 09/20/2021, 9:57 AM

## 2021-09-20 NOTE — Progress Notes (Signed)
PROGRESS NOTE  [name]  DOB: 02/01/1956  PCP: Bartholome Bill, MD ZJI:[id number]  DOA: 09/18/2021  LOS: 1 day  Hospital Day: 3  Brief narrative: [name] is a 66 y.o. female with PMH significant for PTSD, schizoaffective disorder, depression, IBS, chronic unsteady gait, HTN, pancreatitis, continues to smoke and drink alcohol Patient presented to the ED on 6/11 with complaint of nausea, vomiting, diarrhea, abdominal pain for past 4 days with poor oral intake and lightheadedness.  In the ED, patient was hemodynamically stable. Labs showed sodium level significantly low at 116, potassium low at 2.1, magnesium low at 1.2, WBC elevated 10.7, hemoglobin 11.9, Urinalysis with clear yellow urine, moderate amount of leukocytes, positive nitrite, rare bacteria, urine osmolality low at 94 CT abdomen pelvis did not show any acute intra-abdominal finding, showed hepatic steatosis, sigmoid diverticulosis.  Subjective: Patient was seen and examined this morning.   Lying on bed.  Drowsy and muffled voice probably because of the effect of antiemetics given earlier.   Continues to have diarrhea but improving frequency.  Had 1 episode last night and 1 this morning.  Nausea improving.  On clear liquid diet.  I will advance her to full liquid diet  Principal Problem:   Hyponatremia Active Problems:   HTN (hypertension)   Hypokalemia   Schizoaffective disorder, bipolar type (HCC)   Dehydration   QT prolongation   UTI (urinary tract infection)    Assessment and plan: Acute gastroenteritis -Presented with 4 days of nausea, vomiting, diarrhea, abdominal pain with subsequent dehydration -CT abdomen on admission did not show any acute intra-abdominal finding -C. difficile assay and GI pathogen panel negative. -Vomiting and diarrhea improving frequency.  Continue as needed IV antiemetics, on Imodium 2 mg scheduled twice daily for next 2 days.  Minimize use of antiemetics because of  risk of oversedation and associated QTc prolongation -Continue to monitor for diarrhea.   -Can advance diet to full liquid.  Gram-negative UTI -Urinalysis with positive nitrite, moderate leukocytes, rare bacteria -Urine culture growing more than 100,000 CFU per mL of gram-negative rod, pending identification and sensitivity -Currently on IV Rocephin.  Essential hypertension -PTA on lisinopril 20 mg daily.  Currently on hold.  Hypokalemia/hypomagnesemia/hypophosphatemia -Significantly low level of electrolytes because of GI loss.   -Labs improving with replacement.  Repeat tomorrow. Recent Labs  Lab 09/18/21 1950 09/19/21 0144 09/19/21 0313 09/19/21 0846 09/19/21 1235 09/19/21 2121 09/20/21 0317  K 2.1* 2.3* 2.4* 3.2* 3.8 3.5  --   MG 1.2* 2.1  --   --   --   --  1.9  PHOS  --  1.1*  --   --   --   --  3.0    Acute severe hyponatremia -Sodium level was significantly low at 116 on presentation.  Probably hypovolemic hyponatremia due to poor intake and vomiting.  Improving on IV fluid.  Continue the same. Recent Labs  Lab 09/18/21 1950 09/19/21 0144 09/19/21 0313 09/19/21 0846 09/19/21 1235 09/19/21 2121  NA 116* 127* 126* 125* 126* 132*   Schizoaffective disorder, bipolar type -EKG on admission showed a QTc of 496 ms. -Repeat EKG on 6/12 showed QTc improvement to 4/66 with electrolyte correction.  Dementia -PTA, on benztropine 1 mg daily, Geodon 80 mg twice daily, trazodone 200 mg at bedtime, sertraline 200 mg daily, oxycodone 5 mg twice daily -Currently all mood altering medications are on hold. -Continue monitor symptoms  GERD -Continue Prilosec, Carafate   Goals of care   Code Status:  Full Code    Mobility: Encourage ambulation.  May benefit from PT  Skin assessment:     Nutritional status:  Body mass index is 28.91 kg/m.          Diet:  Diet Order             Diet full liquid Room service appropriate? Yes; Fluid consistency: Thin  Diet  effective now                   DVT prophylaxis:  SCDs Start: 09/19/21 0108   Antimicrobials: Currently on IV Rocephin Fluid: Continue NS at 100/h Consultants: None Family Communication: None at bedside  Status is: Inpatient  Continue in-hospital care because: Needs monitoring for further diarrhea, needs IV fluid and electrolyte monitoring and replacement Level of care: Telemetry   Dispo: The patient is from: Home              Anticipated d/c is to: Pending clinical course.  Home health PT recommended by PT on 6/12.              Patient currently is not medically stable to d/c.   Difficult to place patient No     Infusions:   sodium chloride 10 mL/hr at 09/20/21 1100   sodium chloride Stopped (09/18/21 2351)   sodium chloride Stopped (09/19/21 0019)   sodium chloride 100 mL/hr at 09/20/21 1100   cefTRIAXone (ROCEPHIN)  IV Stopped (09/20/21 0147)    Scheduled Meds:  benztropine  1 mg Oral Daily   Chlorhexidine Gluconate Cloth  6 each Topical Daily   loperamide  2 mg Oral BID   mouth rinse  15 mL Mouth Rinse BID   pantoprazole  40 mg Oral Daily   sucralfate  1 g Oral TID AC & HS    PRN meds: sodium chloride, sodium chloride, sodium chloride, acetaminophen **OR** acetaminophen, albuterol, alum & mag hydroxide-simeth, HYDROcodone-acetaminophen, metoCLOPramide (REGLAN) injection, ondansetron (ZOFRAN) IV   Antimicrobials: Anti-infectives (From admission, onward)    Start     Dose/Rate Route Frequency Ordered Stop   09/19/21 0200  cefTRIAXone (ROCEPHIN) 2 g in sodium chloride 0.9 % 100 mL IVPB        2 g 200 mL/hr over 30 Minutes Intravenous Every 24 hours 09/19/21 0116         Objective: Vitals:   09/20/21 1000 09/20/21 1100  BP: (!) 149/60 108/69  Pulse:    Resp: 15 (!) 21  Temp:    SpO2:      Intake/Output Summary (Last 24 hours) at 09/20/2021 1127 Last data filed at 09/20/2021 1100 Gross per 24 hour  Intake 3019 ml  Output 800 ml  Net 2219 ml    Filed Weights   09/18/21 1930 09/19/21 0300  Weight: 61.7 kg 71.7 kg   Weight change:  Body mass index is 28.91 kg/m.   Physical Exam: General exam: Pleasant, elderly Caucasian female. Skin: No rashes, lesions or ulcers. HEENT: Atraumatic, normocephalic, no obvious bleeding Lungs: Clear to auscultation bilaterally CVS: Regular rate and rhythm, no murmur GI/Abd soft, mildly patient alert, none distended, bowel sound present CNS: Drowsy from the effect of nausea medicines given earlier Psychiatry: Sad affect Extremities: No pedal edema, no calf tenderness  Data Review: I have personally reviewed the laboratory data and studies available.  F/u labs ordered Unresulted Labs (From admission, onward)     Start     Ordered   09/21/21 0500  CBC with Differential/Platelet  Tomorrow morning,  R       Question:  Specimen collection method  Answer:  Lab=Lab collect   09/20/21 0744   09/21/21 5956  Basic metabolic panel  Tomorrow morning,   R       Question:  Specimen collection method  Answer:  Lab=Lab collect   09/20/21 0744   09/21/21 0500  Phosphorus  Tomorrow morning,   R       Question:  Specimen collection method  Answer:  Lab=Lab collect   09/20/21 1124   09/21/21 0500  Magnesium  Tomorrow morning,   R       Question:  Specimen collection method  Answer:  Lab=Lab collect   09/20/21 1124            Signed, Terrilee Croak, MD [facility] 09/20/2021

## 2021-09-21 ENCOUNTER — Encounter (HOSPITAL_COMMUNITY): Payer: Self-pay | Admitting: Internal Medicine

## 2021-09-21 DIAGNOSIS — E876 Hypokalemia: Secondary | ICD-10-CM

## 2021-09-21 DIAGNOSIS — R197 Diarrhea, unspecified: Secondary | ICD-10-CM | POA: Diagnosis not present

## 2021-09-21 DIAGNOSIS — N3000 Acute cystitis without hematuria: Secondary | ICD-10-CM | POA: Diagnosis not present

## 2021-09-21 DIAGNOSIS — E871 Hypo-osmolality and hyponatremia: Secondary | ICD-10-CM | POA: Diagnosis not present

## 2021-09-21 LAB — PHOSPHORUS: Phosphorus: 3.2 mg/dL (ref 2.5–4.6)

## 2021-09-21 LAB — CBC WITH DIFFERENTIAL/PLATELET
Abs Immature Granulocytes: 0.09 10*3/uL — ABNORMAL HIGH (ref 0.00–0.07)
Basophils Absolute: 0 10*3/uL (ref 0.0–0.1)
Basophils Relative: 0 %
Eosinophils Absolute: 0.2 10*3/uL (ref 0.0–0.5)
Eosinophils Relative: 2 %
HCT: 28.4 % — ABNORMAL LOW (ref 36.0–46.0)
Hemoglobin: 9.3 g/dL — ABNORMAL LOW (ref 12.0–15.0)
Immature Granulocytes: 1 %
Lymphocytes Relative: 20 %
Lymphs Abs: 1.6 10*3/uL (ref 0.7–4.0)
MCH: 29 pg (ref 26.0–34.0)
MCHC: 32.7 g/dL (ref 30.0–36.0)
MCV: 88.5 fL (ref 80.0–100.0)
Monocytes Absolute: 0.5 10*3/uL (ref 0.1–1.0)
Monocytes Relative: 6 %
Neutro Abs: 5.6 10*3/uL (ref 1.7–7.7)
Neutrophils Relative %: 71 %
Platelets: 210 10*3/uL (ref 150–400)
RBC: 3.21 MIL/uL — ABNORMAL LOW (ref 3.87–5.11)
RDW: 18.7 % — ABNORMAL HIGH (ref 11.5–15.5)
WBC: 8 10*3/uL (ref 4.0–10.5)
nRBC: 0 % (ref 0.0–0.2)

## 2021-09-21 LAB — URINE CULTURE
Culture: >=100000 — AB
Culture: >=100000 — AB

## 2021-09-21 LAB — BASIC METABOLIC PANEL
Anion gap: 6 (ref 5–15)
BUN: <5 mg/dL — ABNORMAL LOW (ref 8–23)
CO2: 23 mmol/L (ref 22–32)
Calcium: 8.5 mg/dL — ABNORMAL LOW (ref 8.9–10.3)
Chloride: 106 mmol/L (ref 98–111)
Creatinine, Ser: 0.59 mg/dL (ref 0.44–1.00)
GFR, Estimated: >60 mL/min (ref >60)
Glucose, Bld: 100 mg/dL — ABNORMAL HIGH (ref 70–99)
Potassium: 3.1 mmol/L — ABNORMAL LOW (ref 3.5–5.1)
Sodium: 135 mmol/L (ref 135–145)

## 2021-09-21 LAB — MAGNESIUM: Magnesium: 1.8 mg/dL (ref 1.7–2.4)

## 2021-09-21 MED ORDER — CIPROFLOXACIN HCL 500 MG PO TABS
500.0000 mg | ORAL_TABLET | Freq: Two times a day (BID) | ORAL | Status: DC
  Administered 2021-09-21 – 2021-09-23 (×5): 500 mg via ORAL
  Filled 2021-09-21 (×5): qty 1

## 2021-09-21 MED ORDER — POTASSIUM CHLORIDE 20 MEQ PO PACK
40.0000 meq | PACK | Freq: Once | ORAL | Status: AC
  Administered 2021-09-21: 40 meq via ORAL
  Filled 2021-09-21: qty 2

## 2021-09-21 MED ORDER — LISINOPRIL 20 MG PO TABS
20.0000 mg | ORAL_TABLET | Freq: Every day | ORAL | Status: DC
  Administered 2021-09-21 – 2021-09-22 (×2): 20 mg via ORAL
  Filled 2021-09-21 (×2): qty 1

## 2021-09-21 MED ORDER — CEFUROXIME AXETIL 500 MG PO TABS: 500.0000 mg | ORAL_TABLET | Freq: Two times a day (BID) | ORAL | Status: DC

## 2021-09-21 MED ORDER — SERTRALINE HCL 100 MG PO TABS
100.0000 mg | ORAL_TABLET | Freq: Every day | ORAL | Status: DC
  Administered 2021-09-21 – 2021-09-23 (×3): 100 mg via ORAL
  Filled 2021-09-21 (×3): qty 1

## 2021-09-21 MED ORDER — POTASSIUM CHLORIDE CRYS ER 20 MEQ PO TBCR
40.0000 meq | EXTENDED_RELEASE_TABLET | Freq: Once | ORAL | Status: AC
  Administered 2021-09-21: 40 meq via ORAL
  Filled 2021-09-21: qty 2

## 2021-09-21 MED ORDER — RISAQUAD PO CAPS
2.0000 | ORAL_CAPSULE | Freq: Every day | ORAL | Status: DC
  Administered 2021-09-21 – 2021-09-23 (×3): 2 via ORAL
  Filled 2021-09-21 (×3): qty 2

## 2021-09-21 NOTE — Progress Notes (Signed)
Physical Therapy Treatment Patient Details Name: [name] MRN: [id number] DOB: September 12, 1955 Today's Date: 09/21/2021   History of Present Illness 66 yo female admitted with N/V/D, hyponatremia, hypokalemia, abd pain. hx of pancreatitis, L hip IM nail 2021, PTSD, schizophrenia    PT Comments    Pt is progressing well. She walked ~225 feet with a RW. No LOB. Tolerated distance well.    Recommendations for follow up therapy are one component of a multi-disciplinary discharge planning process, led by the attending physician.  Recommendations may be updated based on patient status, additional functional criteria and insurance authorization.  Follow Up Recommendations  No PT follow up     Assistance Recommended at Discharge PRN  Patient can return home with the following     Equipment Recommendations  None recommended by PT    Recommendations for Other Services       Precautions / Restrictions Precautions Precautions: None Restrictions Weight Bearing Restrictions: No     Mobility  Bed Mobility Overal bed mobility: Modified Independent                  Transfers Overall transfer level: Modified independent                      Ambulation/Gait Ambulation/Gait assistance: Supervision Gait Distance (Feet): 225 Feet Assistive device: Rolling walker (2 wheels) Gait Pattern/deviations: Step-through pattern, Decreased stride length       General Gait Details: supv for safety. no lob with rw   Stairs             Wheelchair Mobility    Modified Rankin (Stroke Patients Only)       Balance Overall balance assessment: Mild deficits observed, not formally tested                                          Cognition Arousal/Alertness: Awake/alert Behavior During Therapy: WFL for tasks assessed/performed Overall Cognitive Status: Within Functional Limits for tasks assessed                                 General  Comments: a bit soft spoken.        Exercises      General Comments        Pertinent Vitals/Pain Pain Assessment Pain Assessment: Faces Faces Pain Scale: Hurts a little bit Pain Location: abdomen Pain Descriptors / Indicators: Discomfort, Aching Pain Intervention(s): Monitored during session    Home Living                          Prior Function            PT Goals (current goals can now be found in the care plan section) Progress towards PT goals: Progressing toward goals    Frequency    Min 3X/week      PT Plan Discharge plan needs to be updated    Co-evaluation              AM-PAC PT "6 Clicks" Mobility   Outcome Measure  Help needed turning from your back to your side while in a flat bed without using bedrails?: None Help needed moving from lying on your back to sitting on the side of a flat bed without using bedrails?: None  Help needed moving to and from a bed to a chair (including a wheelchair)?: None Help needed standing up from a chair using your arms (e.g., wheelchair or bedside chair)?: None Help needed to walk in hospital room?: A Little Help needed climbing 3-5 steps with a railing? : A Little 6 Click Score: 22    End of Session Equipment Utilized During Treatment: Gait belt Activity Tolerance: Patient tolerated treatment well     PT Visit Diagnosis: Unsteadiness on feet (R26.81)     Time: 0240-9735 PT Time Calculation (min) (ACUTE ONLY): 29 min  Charges:  $Gait Training: 23-37 mins                         Doreatha Massed, PT Acute Rehabilitation  Office: [phone] Pager: [phone]

## 2021-09-21 NOTE — Progress Notes (Signed)
PROGRESS NOTE  [name]  DOB: 1955/09/01  PCP: Bartholome Bill, MD OIN:[id number]  DOA: 09/18/2021  LOS: 2 days  Hospital Day: 4  Brief narrative: [name] is a 66 y.o. female with PMH significant for PTSD, schizoaffective disorder, depression, IBS, chronic unsteady gait, HTN, pancreatitis, continues to smoke and drink alcohol Patient presented to the ED on 6/11 with complaint of nausea, vomiting, diarrhea, abdominal pain for past 4 days with poor oral intake and lightheadedness. CT abdomen pelvis did not show any acute intra-abdominal finding, showed hepatic steatosis, sigmoid diverticulosis. Slowly improving.  Subjective: Still saying she is having "watery" stools frequently.  Nursing documentation shows 2 mushy stools only    Assessment and plan: Acute gastroenteritis -Presented with 4 days of nausea, vomiting, diarrhea, abdominal pain with subsequent dehydration -CT abdomen on admission did not show any acute intra-abdominal finding -C. difficile assay and GI pathogen panel negative. -Vomiting and diarrhea improving  - Minimize use of antiemetics because of risk of oversedation and associated QTc prolongation -advance diet  Gram-negative UTI -Urinalysis with positive nitrite, moderate leukocytes, rare bacteria -Urine culture: enterobacter -change to PO abx  Essential hypertension -re-order home meds  Hypokalemia/hypomagnesemia/hypophosphatemia -Significantly low level of electrolytes because of GI loss.   -replete  Acute severe hyponatremia -Sodium level was significantly low at 116 on presentation.  Probably hypovolemic hyponatremia due to poor intake and vomiting.   -resolved  Schizoaffective disorder, bipolar type -EKG on admission showed a QTc of 496 ms. -Repeat EKG on 6/12 showed QTc improvement to 466 with electrolyte correction. -PTA, on benztropine 1 mg daily, Geodon 80 mg twice daily, trazodone 200 mg at bedtime, sertraline 200 mg daily,  oxycodone 5 mg twice daily -Currently all mood altering medications are on hold. -Continue monitor symptoms  GERD -Continue Prilosec, Carafate   Goals of care   Code Status: Full Code    Mobility: ambulate   Nutrition Status: Nutrition Problem: Inadequate oral intake Etiology: acute illness, nausea, diarrhea Signs/Symptoms: per patient/family report Interventions: Boost Breeze, MVI       DVT prophylaxis:  SCDs Start: 09/19/21 0108    Consultants: None Family Communication: None at bedside  Status is: Inpatient  Continue in-hospital care because: Needs monitoring for further diarrhea, needs IV fluid and electrolyte monitoring and replacement Level of care: Telemetry   Dispo: The patient is from: Home              Anticipated d/c is to: Pending clinical course.  Home health PT recommended by PT on 6/12.              Patient currently is not medically stable to d/c. 24- 48 hours   Difficult to place patient No     Infusions:   sodium chloride 10 mL/hr at 09/21/21 0944   sodium chloride Stopped (09/18/21 2351)   sodium chloride Stopped (09/19/21 0019)    Scheduled Meds:  acidophilus  2 capsule Oral Daily   benztropine  1 mg Oral Daily   Chlorhexidine Gluconate Cloth  6 each Topical Daily   ciprofloxacin  500 mg Oral BID   feeding supplement  1 Container Oral BID BM   loperamide  2 mg Oral BID   multivitamin with minerals  1 tablet Oral Daily   pantoprazole  40 mg Oral Daily   sucralfate  1 g Oral TID AC & HS    PRN meds: sodium chloride, sodium chloride, sodium chloride, acetaminophen **OR** acetaminophen, albuterol, alum & mag hydroxide-simeth, HYDROcodone-acetaminophen, metoCLOPramide (  REGLAN) injection, ondansetron (ZOFRAN) IV   Antimicrobials: Anti-infectives (From admission, onward)    Start     Dose/Rate Route Frequency Ordered Stop   09/22/21 0800  cefUROXime (CEFTIN) tablet 500 mg  Status:  Discontinued        500 mg Oral 2 times daily with  meals 09/21/21 0931 09/21/21 0941   09/21/21 1030  ciprofloxacin (CIPRO) tablet 500 mg        500 mg Oral 2 times daily 09/21/21 0941 09/26/21 0759   09/19/21 0200  cefTRIAXone (ROCEPHIN) 2 g in sodium chloride 0.9 % 100 mL IVPB  Status:  Discontinued        2 g 200 mL/hr over 30 Minutes Intravenous Every 24 hours 09/19/21 0116 09/21/21 0931       Objective: Vitals:   09/21/21 1000 09/21/21 1008  BP:  (!) 156/75  Pulse: 70 68  Resp: 17 (!) 23  Temp:    SpO2: 96% 94%    Intake/Output Summary (Last 24 hours) at 09/21/2021 1155 Last data filed at 09/21/2021 0944 Gross per 24 hour  Intake 2545.33 ml  Output 450 ml  Net 2095.33 ml   Filed Weights   09/18/21 1930 09/19/21 0300  Weight: 61.7 kg 71.7 kg   Weight change:  Body mass index is 28.91 kg/m.   Physical Exam:  General: Appearance:     Overweight female in no acute distress     Lungs:      respirations unlabored  Heart:    Normal heart rate.   MS:   All extremities are intact.   Neurologic:   Awake, alert     Data Review: I have personally reviewed the laboratory data and studies available.  F/u labs ordered Unresulted Labs (From admission, onward)     Start     Ordered   09/22/21 0500  CBC  Tomorrow morning,   R       Question:  Specimen collection method  Answer:  Lab=Lab collect   09/21/21 0947   09/22/21 7902  Basic metabolic panel  Tomorrow morning,   R       Question:  Specimen collection method  Answer:  Lab=Lab collect   09/21/21 0947            Signed, Geradine Girt, DO [facility] 09/21/2021

## 2021-09-22 LAB — BASIC METABOLIC PANEL
Anion gap: 3 — ABNORMAL LOW (ref 5–15)
BUN: <5 mg/dL — ABNORMAL LOW (ref 8–23)
CO2: 26 mmol/L (ref 22–32)
Calcium: 8.9 mg/dL (ref 8.9–10.3)
Chloride: 107 mmol/L (ref 98–111)
Creatinine, Ser: 0.52 mg/dL (ref 0.44–1.00)
GFR, Estimated: >60 mL/min (ref >60)
Glucose, Bld: 105 mg/dL — ABNORMAL HIGH (ref 70–99)
Potassium: 3.7 mmol/L (ref 3.5–5.1)
Sodium: 136 mmol/L (ref 135–145)

## 2021-09-22 LAB — CBC
HCT: 29.7 % — ABNORMAL LOW (ref 36.0–46.0)
Hemoglobin: 9.8 g/dL — ABNORMAL LOW (ref 12.0–15.0)
MCH: 29.5 pg (ref 26.0–34.0)
MCHC: 33 g/dL (ref 30.0–36.0)
MCV: 89.5 fL (ref 80.0–100.0)
Platelets: 205 10*3/uL (ref 150–400)
RBC: 3.32 MIL/uL — ABNORMAL LOW (ref 3.87–5.11)
RDW: 18.6 % — ABNORMAL HIGH (ref 11.5–15.5)
WBC: 9 10*3/uL (ref 4.0–10.5)
nRBC: 0 % (ref 0.0–0.2)

## 2021-09-22 MED ORDER — LOPERAMIDE HCL 2 MG PO CAPS
4.0000 mg | ORAL_CAPSULE | Freq: Two times a day (BID) | ORAL | Status: DC
  Administered 2021-09-22 – 2021-09-23 (×3): 4 mg via ORAL
  Filled 2021-09-22 (×3): qty 2

## 2021-09-22 MED ORDER — LISINOPRIL 20 MG PO TABS
40.0000 mg | ORAL_TABLET | Freq: Every day | ORAL | Status: DC
  Administered 2021-09-23: 40 mg via ORAL
  Filled 2021-09-22: qty 2

## 2021-09-22 MED ORDER — COLESTIPOL HCL 1 G PO TABS
1.0000 g | ORAL_TABLET | Freq: Two times a day (BID) | ORAL | Status: DC
  Administered 2021-09-22 – 2021-09-23 (×2): 1 g via ORAL
  Filled 2021-09-22 (×3): qty 1

## 2021-09-22 MED ORDER — DICYCLOMINE HCL 20 MG PO TABS
20.0000 mg | ORAL_TABLET | Freq: Three times a day (TID) | ORAL | Status: DC
  Administered 2021-09-22 – 2021-09-23 (×2): 20 mg via ORAL
  Filled 2021-09-22 (×3): qty 1

## 2021-09-22 MED ORDER — OXYCODONE HCL 5 MG PO TABS: 5.0000 mg | ORAL_TABLET | Freq: Two times a day (BID) | ORAL | Status: DC | PRN

## 2021-09-22 MED ORDER — MAGNESIUM GLUCONATE 500 MG PO TABS
500.0000 mg | ORAL_TABLET | Freq: Two times a day (BID) | ORAL | Status: DC
  Administered 2021-09-22: 500 mg via ORAL
  Filled 2021-09-22: qty 1

## 2021-09-22 MED ORDER — ZIPRASIDONE HCL 80 MG PO CAPS
80.0000 mg | ORAL_CAPSULE | Freq: Two times a day (BID) | ORAL | Status: DC
  Administered 2021-09-22 – 2021-09-23 (×3): 80 mg via ORAL
  Filled 2021-09-22 (×4): qty 1

## 2021-09-22 NOTE — Consult Note (Signed)
Referring Provider: [facility] Primary Care Physician:  Bartholome Bill, MD Primary Gastroenterologist:  [facility]  Reason for Consultation:  diarrhea, abdominal pain  HPI: [name] is a 66 y.o. female with medical history significant of PTSD schizoaffective disorder depression IBS, chronic unsteady gait, hypertension, pancreatitis, smoking.   Patient initially presented to the ED 09/18/2021 for nausea, vomiting, diarrhea, abdominal pain.  She notes her symptoms started 4 to 5 days prior to when she presented to the emergency room.  Upon admission her nausea and vomiting resolved.  She continues to have diarrhea 2-3 episodes daily.  Notes stools are dark but denies blood.  Notes stool is watery.  Reports some improvement with Imodium.  Prior to her current illness she was having soft and well formed bowel movements most days.  She continues to have intermittent abdominal pain in her mid abdomen abdomen.  Notes pain worsens with eating and improves with bowel movements.  Describes pain as achy.   She reports weight loss but unable to define amount lost/or over what period of time.  Denies blood thinning medications.  Denies alcohol use.  Endorses smoking.  Denies IV drug use.  Denies marijuana use.  Denies family history of colon cancer  EGD 04/2017 reflux esophagitis, but path was negative for Barrett's. Repeat EGD recommended for 04/2020.   Colonoscopy 02/2016  sigmoid diverticulosis and internal hemorrhoids. Repeat colonoscopy due in 02/2021 due to past h/o colonic adenomas.  Nuclear medicine gastric emptying scan 05/29/2019 2.1 emptied at 1 hr ( normal >= 10%)  26.0 emptied at 2 hr ( normal >= 40%)  28.4 emptied at 3 hr ( normal >= 70%)  30.4 emptied at 4 hr ( normal >= 90%)  IMPRESSION:  Delayed gastric emptying study.   She had cholecystectomy in 1998.    Past Medical History:  Diagnosis Date   DDD (degenerative disc disease), lumbar    Depression     Diverticulosis    Dizziness    GERD (gastroesophageal reflux disease)    IBS (irritable bowel syndrome)    Internal hemorrhoid    2nd degree   Myocarditis ([place]) 2009   Osteoarthritis    Prolapsed internal hemorrhoids, grade 3 12/01/2015   Recurrent falls    Schizoaffective disorder ([place])    Tubular adenoma of colon 08/2011    Past Surgical History:  Procedure Laterality Date   BREAST BIOPSY Right 12/27/2012   CHOLECYSTECTOMY     HEMORRHOID BANDING     INTRAMEDULLARY (IM) NAIL INTERTROCHANTERIC Left 01/24/2020   Procedure: INTRAMEDULLARY (IM) NAIL INTERTROCHANTRIC;  Surgeon: Leandrew Koyanagi, MD;  Location: [facility];  Service: Orthopedics;  Laterality: Left;    Prior to Admission medications   Medication Sig Start Date End Date Taking? Authorizing Provider  benztropine (COGENTIN) 1 MG tablet Take 1 tablet (1 mg total) by mouth daily. 08/25/21  Yes [name], MD  clobetasol (TEMOVATE) 0.05 % external solution Apply 1 application. topically 2 (two) times daily. 12/13/19  Yes [provider]  lisinopril (ZESTRIL) 20 MG tablet Take 20 mg by mouth daily.   Yes [provider]  magnesium gluconate (MAGONATE) 500 MG tablet Take 500 mg by mouth 2 (two) times daily.   Yes [provider]  omeprazole (PRILOSEC) 40 MG capsule Take 40 mg by mouth in the morning and at bedtime.   Yes [provider]  oxycodone (OXY-IR) 5 MG capsule Take 5 mg by mouth every 12 (twelve) hours as needed for pain.  Yes [provider]  sertraline (ZOLOFT) 100 MG tablet TAKE 2 TABLETS(200 MG) BY MOUTH DAILY 08/25/21  Yes [name], MD  sucralfate (CARAFATE) 1 GM/10ML suspension Take 10 mLs (1 g total) by mouth 4 (four) times daily -  with meals and at bedtime. Patient taking differently: Take 1 g by mouth 4 (four) times daily. 01/20/20 09/19/21 Yes Donne Hazel, MD  traZODone (DESYREL) 100 MG tablet TAKE 2 TABLETS(200 MG) BY MOUTH AT BEDTIME AS NEEDED FOR SLEEP  08/25/21  Yes [name], MD  Vitamin D, Ergocalciferol, (DRISDOL) 1.25 MG (50000 UNIT) CAPS capsule Take 50,000 Units by mouth once a week. 11/21/19  Yes [provider]  ziprasidone (GEODON) 80 MG capsule Take 1 capsule (80 mg total) by mouth in the morning and at bedtime. 08/25/21  Yes [name], MD    Scheduled Meds:  acidophilus  2 capsule Oral Daily   benztropine  1 mg Oral Daily   ciprofloxacin  500 mg Oral BID   feeding supplement  1 Container Oral BID BM   [START ON 09/23/2021] lisinopril  40 mg Oral Daily   loperamide  4 mg Oral BID   multivitamin with minerals  1 tablet Oral Daily   pantoprazole  40 mg Oral Daily   sertraline  100 mg Oral Daily   sucralfate  1 g Oral TID AC & HS   ziprasidone  80 mg Oral BID WC   Continuous Infusions: PRN Meds:.acetaminophen **OR** acetaminophen, albuterol, alum & mag hydroxide-simeth, metoCLOPramide (REGLAN) injection, oxyCODONE  Allergies as of 09/18/2021 - Review Complete 09/18/2021  Allergen Reaction Noted   Penicillins  12/09/2013   Sulfa antibiotics Rash 11/04/2013   Sulfur Rash 05/29/2020    Family History  Problem Relation Age of Onset   Suicidality Father    Depression Father    Suicidality Sister    Depression Sister    Suicidality Cousin    Suicidality Other     Social History   Socioeconomic History   Marital status: Divorced    Spouse name: Not on file   Number of children: 2   Years of education: Not on file   Highest education level: Not on file  Occupational History   Occupation: Disabled  Tobacco Use   Smoking status: Former    Packs/day: 0.50    Years: 38.00    Total pack years: 19.00    Types: Cigarettes    Quit date: 09/09/2019    Years since quitting: 2.0   Smokeless tobacco: Never  Vaping Use   Vaping Use: Never used  Substance and Sexual Activity   Alcohol use: No    Alcohol/week: 0.0 standard drinks of alcohol   Drug use: No   Sexual activity: Not Currently  Other Topics  Concern   Not on file  Social History Narrative   Not on file   Social Determinants of Health   Financial Resource Strain: Not on file  Food Insecurity: Not on file  Transportation Needs: Not on file  Physical Activity: Not on file  Stress: Not on file  Social Connections: Not on file  Intimate Partner Violence: Not on file    Review of Systems: Review of Systems  Constitutional:  Positive for malaise/fatigue. Negative for chills and fever.  HENT:  Negative for hearing loss and tinnitus.   Eyes:  Negative for blurred vision and double vision.  Respiratory:  Negative for cough and hemoptysis.   Cardiovascular:  Negative for chest pain and palpitations.  Gastrointestinal:  Positive for abdominal pain, diarrhea, nausea and vomiting. Negative for blood in stool, constipation, heartburn and melena.  Genitourinary:  Negative for dysuria and urgency.  Musculoskeletal:  Negative for myalgias and neck pain.  Skin:  Negative for itching and rash.  Neurological:  Positive for weakness. Negative for dizziness and headaches.  Endo/Heme/Allergies:  Negative for environmental allergies. Does not bruise/bleed easily.  Psychiatric/Behavioral:  Negative for depression and suicidal ideas.      Physical Exam:Physical Exam Constitutional:      General: She is not in acute distress.    Appearance: Normal appearance. She is normal weight.  HENT:     Head: Normocephalic and atraumatic.     Right Ear: External ear normal.     Left Ear: External ear normal.     Nose: Nose normal.     Mouth/Throat:     Mouth: Mucous membranes are moist.  Eyes:     Pupils: Pupils are equal, round, and reactive to light.  Cardiovascular:     Rate and Rhythm: Normal rate and regular rhythm.     Pulses: Normal pulses.     Heart sounds: Normal heart sounds.  Pulmonary:     Effort: Pulmonary effort is normal.     Breath sounds: Normal breath sounds.  Abdominal:     General: Abdomen is flat. Bowel sounds are  normal. There is no distension.     Palpations: Abdomen is soft. There is no mass.     Tenderness: There is no abdominal tenderness. There is no guarding or rebound.     Hernia: No hernia is present.  Musculoskeletal:        General: No swelling. Normal range of motion.     Cervical back: Normal range of motion and neck supple.  Skin:    General: Skin is warm and dry.  Neurological:     General: No focal deficit present.     Mental Status: She is alert and oriented to person, place, and time. Mental status is at baseline.  Psychiatric:        Mood and Affect: Mood normal.        Behavior: Behavior normal.     Vital signs: Vitals:   09/22/21 0127 09/22/21 0549  BP: 138/85 (!) 161/85  Pulse: 70 66  Resp: 20 18  Temp: 98.5 F (36.9 C) 97.8 F (36.6 C)  SpO2:  94%   Last BM Date : 09/21/21    GI:  Lab Results: Recent Labs    09/20/21 0317 09/21/21 0303 09/22/21 0429  WBC 6.2 8.0 9.0  HGB 10.0* 9.3* 9.8*  HCT 29.0* 28.4* 29.7*  PLT 200 210 205   BMET Recent Labs    09/19/21 2121 09/21/21 0303 09/22/21 0429  NA 132* 135 136  K 3.5 3.1* 3.7  CL 100 106 107  CO2 '25 23 26  '$ GLUCOSE 108* 100* 105*  BUN <5* <5* <5*  CREATININE 0.66 0.59 0.52  CALCIUM 8.4* 8.5* 8.9   LFT No results for input(s): "PROT", "ALBUMIN", "AST", "ALT", "ALKPHOS", "BILITOT", "BILIDIR", "IBILI" in the last 72 hours. PT/INR No results for input(s): "LABPROT", "INR" in the last 72 hours.   Studies/Results: No results found.  Impression: Nausea, vomiting  Nausea and vomiting seem to be resolved since admission.  Sodium and potassium have normalized.  Hemoglobin stable at 9.8.  Patient with history of gastroparesis would recommend follow-up with [place] for outpatient evaluation if nausea and vomiting returns. Lipase normal   Diarrhea Abdominal pain  CT abdomen pelvis with contrast 09/18/2021 1. Hepatic steatosis. 2. Evidence of prior cholecystectomy. 3. Sigmoid  diverticulosis.  GI Pathogen panel negative, C. difficile negative  Patient previously on Creon after episode of acute pancreatitis.  Patient no longer taking Creon.  At this time symptoms seem more likely IBS in nature compared to pancreatic insufficiency.  May consider pancreatic elastase testing outpatient.  Pain worsened with eating and improved with bowel movements, she has reported history of IBS.  Possibly IBS-D flare.  Patient with history of cholecystectomy also possible bile acid diarrhea.   Plan: Start dicyclomine 20 mg 3 times daily before meals. Start colestipol 1 g twice daily for diarrhea. Continue pain control as needed Recommend follow-up outpatient with [facility].  [facility] will follow  LOS: 3 days   Charlott Rakes  PA-C 09/22/2021, 12:50 PM  Contact #  [phone]

## 2021-09-22 NOTE — Progress Notes (Signed)
PROGRESS NOTE  [name]  DOB: Nov 26, 1955  PCP: Bartholome Bill, MD TDD:[id number]  DOA: 09/18/2021  LOS: 3 days  Hospital Day: 5  Brief narrative: [name] is a 66 y.o. female with PMH significant for PTSD, schizoaffective disorder, depression, IBS, chronic unsteady gait, HTN, pancreatitis, continues to smoke and drink alcohol Patient presented to the ED on 6/11 with complaint of nausea, vomiting, diarrhea, abdominal pain for past 4 days with poor oral intake and lightheadedness. CT abdomen pelvis did not show any acute intra-abdominal finding, showed hepatic steatosis, sigmoid diverticulosis. Slowly improving but still having diarrhea and pain with eating  Subjective: Says she is afraid to eat    Assessment and plan: Acute gastroenteritis -Presented with 4 days of nausea, vomiting, diarrhea, abdominal pain with subsequent dehydration -CT abdomen on admission did not show any acute intra-abdominal finding -C. difficile assay and GI pathogen panel negative. - Minimize use of antiemetics because of risk of oversedation and associated QTc prolongation -advance diet-- patient says she is afraid to eat due to "stomach pain" -still with loose stools documented despite imodium  -lipase normal -h/o pancreatitis and was d/c'd on creon- unclear why this was stopped -has seen GI outpatient in [place] but unable to see records  -GI consult  Gram-negative UTI -Urinalysis with positive nitrite, moderate leukocytes, rare bacteria -Urine culture: enterobacter -change to PO abx to complete course -added lactobacillus   Essential hypertension -re-order home meds  Hypokalemia/hypomagnesemia/hypophosphatemia -Significantly low level of electrolytes because of GI loss.   -replete  Acute severe hyponatremia -Sodium level was significantly low at 116 on presentation.  Probably hypovolemic hyponatremia due to poor intake and vomiting.   -resolved  Schizoaffective  disorder, bipolar type -EKG on admission showed a QTc of 496 ms. -Repeat EKG on 6/12 showed QTc improvement to 466 with electrolyte correction. -PTA, on benztropine 1 mg daily, Geodon 80 mg twice daily, trazodone 200 mg at bedtime, sertraline 200 mg daily, oxycodone 5 mg twice daily -Currently all mood altering medications are on hold. -Continue monitor symptoms  GERD -Continue Prilosec, Carafate   Goals of care   Code Status: Full Code    Mobility: ambulate   Nutrition Status: Nutrition Problem: Inadequate oral intake Etiology: acute illness, nausea, diarrhea Signs/Symptoms: per patient/family report Interventions: [place]ze, MVI       DVT prophylaxis:  SCDs Start: 09/19/21 0108    Consultants: None Family Communication: None at bedside  Status is: Inpatient  Continue in-hospital care because: Needs monitoring for further diarrhea, needs IV fluid and electrolyte monitoring and replacement Level of care: Med-Surg   Dispo: The patient is from: Home              Anticipated d/c is to: Pending clinical course.  Home health PT recommended by PT on 6/12.              Patient currently is not medically stable to d/c. 24- 48 hours   Difficult to place patient No     Infusions:     Scheduled Meds:  acidophilus  2 capsule Oral Daily   benztropine  1 mg Oral Daily   ciprofloxacin  500 mg Oral BID   feeding supplement  1 Container Oral BID BM   [START ON 09/23/2021] lisinopril  40 mg Oral Daily   loperamide  4 mg Oral BID   magnesium gluconate  500 mg Oral BID   multivitamin with minerals  1 tablet Oral Daily   pantoprazole  40  mg Oral Daily   sertraline  100 mg Oral Daily   sucralfate  1 g Oral TID AC & HS   ziprasidone  80 mg Oral BID WC    PRN meds: acetaminophen **OR** acetaminophen, albuterol, alum & mag hydroxide-simeth, metoCLOPramide (REGLAN) injection, oxyCODONE   Antimicrobials: Anti-infectives (From admission, onward)    Start     Dose/Rate  Route Frequency Ordered Stop   09/22/21 0800  cefUROXime (CEFTIN) tablet 500 mg  Status:  Discontinued        500 mg Oral 2 times daily with meals 09/21/21 0931 09/21/21 0941   09/21/21 1030  ciprofloxacin (CIPRO) tablet 500 mg        500 mg Oral 2 times daily 09/21/21 0941 09/26/21 0759   09/19/21 0200  cefTRIAXone (ROCEPHIN) 2 g in sodium chloride 0.9 % 100 mL IVPB  Status:  Discontinued        2 g 200 mL/hr over 30 Minutes Intravenous Every 24 hours 09/19/21 0116 09/21/21 0931       Objective: Vitals:   09/22/21 0127 09/22/21 0549  BP: 138/85 (!) 161/85  Pulse: 70 66  Resp: 20 18  Temp: 98.5 F (36.9 C) 97.8 F (36.6 C)  SpO2:  94%    Intake/Output Summary (Last 24 hours) at 09/22/2021 1212 Last data filed at 09/22/2021 0359 Gross per 24 hour  Intake 700.5 ml  Output --  Net 700.5 ml   Filed Weights   09/18/21 1930 09/19/21 0300  Weight: 61.7 kg 71.7 kg   Weight change:  Body mass index is 28.91 kg/m.   Physical Exam:  General: Appearance:     Overweight female in no acute distress     Lungs:      respirations unlabored  Heart:    Normal heart rate.   MS:   All extremities are intact.   Neurologic:   Awake, alert     Data Review: I have personally reviewed the laboratory data and studies available.  F/u labs ordered Unresulted Labs (From admission, onward)    None       Signed, Geradine Girt, DO [facility] 09/22/2021

## 2021-09-22 NOTE — Care Management Important Message (Signed)
Important Message  Patient Details IM Letter given to the Patient. Name: [name] MRN: [id number] Date of Birth: 26-May-1955   Medicare Important Message Given:  Yes     Loan, [place] 09/22/2021, 11:50 AM

## 2021-09-22 NOTE — Progress Notes (Signed)
Physical Therapy Treatment Patient Details Name: [name] MRN: [id number] DOB: 1956/03/17 Today's Date: 09/22/2021   History of Present Illness 66 yo female admitted with N/V/D, hyponatremia, hypokalemia, abd pain. hx of pancreatitis, L hip IM nail 2021, PTSD, schizophrenia    PT Comments    Pt currently in bed, easily aroused.  AxO x 3 pleasant.  Lives home alone with her dog.  Asissted OOB to amb in hallway went well.   Assisted to bathroom.. General transfer comment: good use of B UE's to steady self.  Also asissted to bathroom.  Pt self able to perform peri care as well as donning/doffing underpants standing with no overt LOB. Assisted back to bed per pt request.   Pt plans to D/C back home with NO post Acute PT rec per eval.   Recommendations for follow up therapy are one component of a multi-disciplinary discharge planning process, led by the attending physician.  Recommendations may be updated based on patient status, additional functional criteria and insurance authorization.  Follow Up Recommendations  No PT follow up     Assistance Recommended at Discharge PRN  Patient can return home with the following Assistance with cooking/housework   Equipment Recommendations  None recommended by PT    Recommendations for Other Services       Precautions / Restrictions Precautions Precautions: None Restrictions Weight Bearing Restrictions: No     Mobility  Bed Mobility Overal bed mobility: Modified Independent             General bed mobility comments: self able with increased time    Transfers Overall transfer level: Modified independent Equipment used: Rolling walker (2 wheels) Transfers: Sit to/from Stand, Bed to chair/wheelchair/BSC Sit to Stand: Supervision, Min guard Stand pivot transfers: Supervision, Min guard         General transfer comment: good use of B UE's to steady self.  Also asissted to bathroom.  Pt self able to perform peri care as well  as donning/doffing underpants standing with no overt LOB.    Ambulation/Gait Ambulation/Gait assistance: Supervision Gait Distance (Feet): 185 Feet Assistive device: Rolling walker (2 wheels) Gait Pattern/deviations: Step-through pattern, Decreased stride length Gait velocity: decreased     General Gait Details: tolerated a functional distance to the hallway then back to her room into bathroom.  Good alternating gait and good safety cognition using AD.   Stairs             Wheelchair Mobility    Modified Rankin (Stroke Patients Only)       Balance                                            Cognition Arousal/Alertness: Awake/alert Behavior During Therapy: WFL for tasks assessed/performed Overall Cognitive Status: Within Functional Limits for tasks assessed                                 General Comments: AxO x 3 lives home alone with her dog. Receives "some" help from a Neighbor.        Exercises      General Comments        Pertinent Vitals/Pain Pain Assessment Pain Assessment: No/denies pain    Home Living  Prior Function            PT Goals (current goals can now be found in the care plan section) Progress towards PT goals: Progressing toward goals    Frequency    Min 3X/week      PT Plan Discharge plan needs to be updated    Co-evaluation              AM-PAC PT "6 Clicks" Mobility   Outcome Measure  Help needed turning from your back to your side while in a flat bed without using bedrails?: None Help needed moving from lying on your back to sitting on the side of a flat bed without using bedrails?: None Help needed moving to and from a bed to a chair (including a wheelchair)?: None Help needed standing up from a chair using your arms (e.g., wheelchair or bedside chair)?: None Help needed to walk in hospital room?: A Little Help needed climbing 3-5 steps with  a railing? : A Little 6 Click Score: 22    End of Session Equipment Utilized During Treatment: Gait belt Activity Tolerance: Patient tolerated treatment well Patient left: in bed;with call bell/phone within reach;with bed alarm set Nurse Communication: Mobility status PT Visit Diagnosis: Unsteadiness on feet (R26.81)     Time: 1211-1225 PT Time Calculation (min) (ACUTE ONLY): 14 min  Charges:  $Gait Training: 8-22 mins                     Rica Koyanagi  PTA Acute  Rehabilitation Services Pager      [phone] Office      [phone]

## 2021-09-23 MED ORDER — CIPROFLOXACIN HCL 500 MG PO TABS: 500.0000 mg | ORAL_TABLET | Freq: Two times a day (BID) | ORAL | 0 refills | Status: DC

## 2021-09-23 MED ORDER — LISINOPRIL 40 MG PO TABS: 40.0000 mg | ORAL_TABLET | Freq: Every day | ORAL | 0 refills | Status: DC

## 2021-09-23 MED ORDER — COLESTIPOL HCL 1 G PO TABS: 1.0000 g | ORAL_TABLET | Freq: Two times a day (BID) | ORAL | 0 refills | Status: DC

## 2021-09-23 MED ORDER — DICYCLOMINE HCL 20 MG PO TABS: 20.0000 mg | ORAL_TABLET | Freq: Three times a day (TID) | ORAL | 0 refills | Status: DC

## 2021-09-23 MED ORDER — LOPERAMIDE HCL 2 MG PO CAPS: 4.0000 mg | ORAL_CAPSULE | ORAL | 0 refills | Status: DC | PRN

## 2021-09-23 MED ORDER — RISAQUAD PO CAPS: 2.0000 | ORAL_CAPSULE | Freq: Every day | ORAL | 0 refills | Status: DC

## 2021-09-23 NOTE — Plan of Care (Signed)

## 2021-09-23 NOTE — Progress Notes (Addendum)
[facility] Gastroenterology Progress Note  [name] 66 y.o. 1955/08/17   Subjective: Patient seen and examined sitting in bed preparing to go home.  Patient reports her pain is completely resolved.  Denies further episodes of diarrhea.  States she feels back to normal and ready to go home.  ROS : Review of Systems  Gastrointestinal:  Negative for abdominal pain, blood in stool, constipation, diarrhea, heartburn, melena, nausea and vomiting.  Genitourinary:  Negative for dysuria and urgency.      Objective: Vital signs in last 24 hours: Vitals:   09/23/21 0529 09/23/21 0602  BP: (!) 164/80 (!) 152/73  Pulse: 68 66  Resp: 18 18  Temp: 98.4 F (36.9 C) 98.3 F (36.8 C)  SpO2: 96% 94%    Physical Exam:  General:  Alert, cooperative, no distress, appears stated age  Head:  Normocephalic, without obvious abnormality, atraumatic  Eyes:  Anicteric sclera, EOM's intact  Lungs:   Clear to auscultation bilaterally, respirations unlabored  Heart:  Regular rate and rhythm, S1, S2 normal  Abdomen:   Soft, non-tender, bowel sounds active all four quadrants,  no masses,   Extremities: Extremities normal, atraumatic, no  edema  Pulses: 2+ and symmetric    Lab Results: Recent Labs    09/21/21 0303 09/22/21 0429  NA 135 136  K 3.1* 3.7  CL 106 107  CO2 23 26  GLUCOSE 100* 105*  BUN <5* <5*  CREATININE 0.59 0.52  CALCIUM 8.5* 8.9  MG 1.8  --   PHOS 3.2  --    No results for input(s): "AST", "ALT", "ALKPHOS", "BILITOT", "PROT", "ALBUMIN" in the last 72 hours. Recent Labs    09/21/21 0303 09/22/21 0429  WBC 8.0 9.0  NEUTROABS 5.6  --   HGB 9.3* 9.8*  HCT 28.4* 29.7*  MCV 88.5 89.5  PLT 210 205   No results for input(s): "LABPROT", "INR" in the last 72 hours.    Assessment Nausea, vomiting   Nausea and vomiting seem to be resolved since admission.  Sodium and potassium have normalized.  Hemoglobin stable at 9.8.  Patient with history of gastroparesis would  recommend follow-up with [place] for outpatient evaluation if nausea and vomiting returns. Lipase normal     Diarrhea Abdominal pain   CT abdomen pelvis with contrast 09/18/2021 1. Hepatic steatosis. 2. Evidence of prior cholecystectomy. 3. Sigmoid diverticulosis.   GI Pathogen panel negative, C. difficile negative   Patient previously on Creon after episode of acute pancreatitis.  Patient no longer taking Creon.  At this time symptoms seem more likely IBS in nature compared to pancreatic insufficiency.  May consider pancreatic elastase testing outpatient.  Pain worsened with eating and improved with bowel movements, she has reported history of IBS.  Possibly IBS-D flare.  Patient with history of cholecystectomy also possible bile acid diarrhea.   Plan: Patient can go home with dicyclomine 20 mg 3 times daily before meals and colestipol 1 g twice daily.  Discussed if she begins to feel constipated may decrease colestipol as needed. Recommend follow-up with [facility] will sign off  Charlott Rakes PA-C 09/23/2021, 11:12 AM  Contact #  [phone]

## 2021-09-23 NOTE — Progress Notes (Signed)
Pt is ready for discharge.  Dr. Eliseo Squires at bedside.  Pt remains alert and oriented.  PIV removed.  AVS given.  Pt able to verbalize understanding of all discharge instructions.  All questions answered.

## 2021-09-23 NOTE — Discharge Summary (Signed)
Physician Discharge Summary  [name] ZRA:[id number] DOB: 27-Oct-1955 DOA: 09/18/2021  PCP: Joyce Bill, MD  Admit date: 09/18/2021 Discharge date: 09/23/2021  Admitted From: home Discharge disposition: home   Recommendations for Outpatient Follow-Up:   Outpatient GI follow up BMP 1 week Added BP medication    Discharge Diagnosis:   Principal Problem:   Hyponatremia Active Problems:   HTN (hypertension)   Hypokalemia   Schizoaffective disorder, bipolar type (HCC)   Dehydration   QT prolongation   UTI (urinary tract infection)    Discharge Condition: Improved.  Diet recommendation: bland  Wound care: None.  Code status: Full.   History of Present Illness:   [name] is a 66 y.o. female with PMH significant for PTSD, schizoaffective disorder, depression, IBS, chronic unsteady gait, HTN, pancreatitis, continues to smoke and drink alcohol Patient presented to the ED on 6/11 with complaint of nausea, vomiting, diarrhea, abdominal pain for past 4 days with poor oral intake and lightheadedness. CT abdomen pelvis did not show any acute intra-abdominal finding, showed hepatic steatosis, sigmoid diverticulosis. Slowly improving but still having diarrhea and pain with eating   Hospital Course by Problem:   Acute gastroenteritis -Presented with 4 days of nausea, vomiting, diarrhea, abdominal pain with subsequent dehydration -CT abdomen on admission did not show any acute intra-abdominal finding -C. difficile assay and GI pathogen panel negative. - Minimize use of antiemetics because of risk of oversedation and associated QTc prolongation -advance diet-- patient says she is afraid to eat due to "stomach pain" -still with loose stools documented despite imodium  -lipase normal -h/o pancreatitis and was d/c'd on creon- unclear why this was stopped -has seen GI outpatient in [place] but unable to see records  -GI consult: Patient can go home  with dicyclomine 20 mg 3 times daily before meals and colestipol 1 g twice daily.  Discussed if she begins to feel constipated may decrease colestipol as needed. Recommend follow[facility] with [facility] GI   Gram-negative UTI -Urinalysis with positive nitrite, moderate leukocytes, rare bacteria -Urine culture: enterobacter -change to PO abx to complete course -added lactobacillus    Essential hypertension -lisinopril   Hypokalemia/hypomagnesemia/hypophosphatemia -Significantly low level of electrolytes because of GI loss.   -repleted   Acute severe hyponatremia -Sodium level was significantly low at 116 on presentation.  Probably hypovolemic hyponatremia due to poor intake and vomiting.   -resolved   Schizoaffective disorder, bipolar type -resume home meds -Continue monitor symptoms   GERD -Continue Prilosec, Carafate      Medical Consultants:   GI   Discharge Exam:   Vitals:   09/23/21 0529 09/23/21 0602  BP: (!) 164/80 (!) 152/73  Pulse: 68 66  Resp: 18 18  Temp: 98.4 F (36.9 C) 98.3 F (36.8 C)  SpO2: 96% 94%   Vitals:   09/22/21 1728 09/22/21 2139 09/23/21 0529 09/23/21 0602  BP: (!) 149/80 (!) 155/81 (!) 164/80 (!) 152/73  Pulse: (!) 59 68 68 66  Resp: '17 18 18 18  '$ Temp: 97.9 F (36.6 C) 98.7 F (37.1 C) 98.4 F (36.9 C) 98.3 F (36.8 C)  TempSrc: Oral Oral Oral Oral  SpO2: 98% 96% 96% 94%  Weight:      Height:        General exam: Appears calm and comfortable.    The results of significant diagnostics from this hospitalization (including imaging, microbiology, ancillary and laboratory) are listed below for reference.     Procedures and  Diagnostic Studies:   DG Chest Port 1 View  Result Date: 09/19/2021 CLINICAL DATA:  Hyponatremia EXAM: PORTABLE CHEST 1 VIEW COMPARISON:  01/23/2020 FINDINGS: Mild hyperinflation of the lungs. Heart is normal size. Mediastinal contours within normal limits. Aortic calcifications. Lungs clear. No effusions or  acute bony abnormality. IMPRESSION: Mild hyperinflation. No acute cardiopulmonary disease. Electronically Signed   By: Rolm Baptise M.D.   On: 09/19/2021 02:13   CT Head Wo Contrast  Result Date: 09/18/2021 CLINICAL DATA:  Head trauma. EXAM: CT HEAD WITHOUT CONTRAST TECHNIQUE: Contiguous axial images were obtained from the base of the skull through the vertex without intravenous contrast. RADIATION DOSE REDUCTION: This exam was performed according to the departmental dose-optimization program which includes automated exposure control, adjustment of the mA and/or kV according to patient size and/or use of iterative reconstruction technique. COMPARISON:  Head CT dated 07/20/2021. FINDINGS: Brain: Mild age-related atrophy and chronic microvascular ischemic changes. There is no acute intracranial hemorrhage. No mass effect or midline shift. No extra-axial fluid collection. Vascular: No hyperdense vessel or unexpected calcification. Skull: Normal. Negative for fracture or focal lesion. Sinuses/Orbits: No acute finding. Other: None IMPRESSION: 1. No acute intracranial pathology. 2. Mild age-related atrophy and chronic microvascular ischemic changes. Electronically Signed   By: Anner Crete M.D.   On: 09/18/2021 22:36   CT ABDOMEN PELVIS W CONTRAST  Result Date: 09/18/2021 CLINICAL DATA:  Abdominal pain. EXAM: CT ABDOMEN AND PELVIS WITH CONTRAST TECHNIQUE: Multidetector CT imaging of the abdomen and pelvis was performed using the standard protocol following bolus administration of intravenous contrast. RADIATION DOSE REDUCTION: This exam was performed according to the departmental dose-optimization program which includes automated exposure control, adjustment of the mA and/or kV according to patient size and/or use of iterative reconstruction technique. CONTRAST:  179m OMNIPAQUE IOHEXOL 300 MG/ML  SOLN COMPARISON:  July 21, 2021 FINDINGS: Lower chest: No acute abnormality. Hepatobiliary: There is diffuse  fatty infiltration of the liver parenchyma. No focal liver abnormality is seen. Status post cholecystectomy. No biliary dilatation. Pancreas: Unremarkable. No pancreatic ductal dilatation or surrounding inflammatory changes. Spleen: Normal in size without focal abnormality. Adrenals/Urinary Tract: Adrenal glands are unremarkable. Kidneys are normal in size, without renal calculi or hydronephrosis. A 6.8 cm x 6.3 cm simple cyst is seen within the lower pole of the left kidney. No additional follow-up per imaging is recommended. Bladder is unremarkable. Stomach/Bowel: Stomach is within normal limits. Appendix appears normal. No evidence of bowel wall thickening, distention, or inflammatory changes. Noninflamed diverticula are seen throughout the sigmoid colon. Vascular/Lymphatic: Aortic atherosclerosis. No enlarged abdominal or pelvic lymph nodes. Reproductive: Uterus and bilateral adnexa are unremarkable. Other: No abdominal wall hernia or abnormality. No abdominopelvic ascites. Musculoskeletal: A metallic density intramedullary rod and compression screw device is seen within the proximal left femur. Degenerative changes are seen within the lumbar spine, most prominent at the level of L3-L4. IMPRESSION: 1. Hepatic steatosis. 2. Evidence of prior cholecystectomy. 3. Sigmoid diverticulosis. Aortic Atherosclerosis (ICD10-I70.0). Electronically Signed   By: TVirgina NorfolkM.D.   On: 09/18/2021 21:06     Labs:   Basic Metabolic Panel: Recent Labs  Lab 09/18/21 1950 09/19/21 0144 09/19/21 0313 09/19/21 0846 09/19/21 1235 09/19/21 2121 09/20/21 0317 09/21/21 0303 09/22/21 0429  NA 116* 127*   < > 125* 126* 132*  --  135 136  K 2.1* 2.3*   < > 3.2* 3.8 3.5  --  3.1* 3.7  CL 79* 92*   < > 94* 93* 100  --  106 107  CO2 21* 25   < > '24 23 25  '$ --  23 26  GLUCOSE 152* 119*   < > 126* 133* 108*  --  100* 105*  BUN 5* <5*   < > <5* <5* <5*  --  <5* <5*  CREATININE 0.78 0.70   < > 0.78 0.68 0.66  --  0.59  0.52  CALCIUM 9.7 8.9   < > 8.5* 8.6* 8.4*  --  8.5* 8.9  MG 1.2* 2.1  --   --   --   --  1.9 1.8  --   PHOS  --  1.1*  --   --   --   --  3.0 3.2  --    < > = values in this interval not displayed.   GFR Estimated Creatinine Clearance: 65 mL/min (by C-G formula based on SCr of 0.52 mg/dL). Liver Function Tests: Recent Labs  Lab 09/18/21 1950 09/19/21 0144  AST 23 23  ALT 18 19  ALKPHOS 140* 130*  BILITOT 1.2 1.1  PROT 7.7 6.6  ALBUMIN 4.3 3.7   Recent Labs  Lab 09/18/21 1950  LIPASE <10*   No results for input(s): "AMMONIA" in the last 168 hours. Coagulation profile No results for input(s): "INR", "PROTIME" in the last 168 hours.  CBC: Recent Labs  Lab 09/18/21 1950 09/19/21 0144 09/20/21 0317 09/21/21 0303 09/22/21 0429  WBC 10.7* 8.1 6.2 8.0 9.0  NEUTROABS  --   --  4.4 5.6  --   HGB 11.9* 11.4* 10.0* 9.3* 9.8*  HCT 32.5* 30.7* 29.0* 28.4* 29.7*  MCV 78.7* 80.4 86.3 88.5 89.5  PLT 288 228 200 210 205   Cardiac Enzymes: Recent Labs  Lab 09/19/21 0144  CKTOTAL 171   BNP: Invalid input(s): "POCBNP" CBG: No results for input(s): "GLUCAP" in the last 168 hours. D-Dimer No results for input(s): "DDIMER" in the last 72 hours. Hgb A1c No results for input(s): "HGBA1C" in the last 72 hours. Lipid Profile No results for input(s): "CHOL", "HDL", "LDLCALC", "TRIG", "CHOLHDL", "LDLDIRECT" in the last 72 hours. Thyroid function studies No results for input(s): "TSH", "T4TOTAL", "T3FREE", "THYROIDAB" in the last 72 hours.  Invalid input(s): "FREET3" Anemia work up No results for input(s): "VITAMINB12", "FOLATE", "FERRITIN", "TIBC", "IRON", "RETICCTPCT" in the last 72 hours. Microbiology Recent Results (from the past 240 hour(s))  Urine Culture     Status: Abnormal   Collection Time: 09/18/21 11:00 PM   Specimen: Urine, Clean Catch  Result Value Ref Range Status   Specimen Description   Final    URINE, CLEAN CATCH Performed at [facility], [address], [place], [place] [address]    Special Requests   Final    NONE Performed at [facility], [address] [place], [place] [address]    Culture >=100,000 COLONIES/mL ENTEROBACTER AEROGENES (A)  Final   Report Status 09/21/2021 FINAL  Final   Organism ID, Bacteria ENTEROBACTER AEROGENES (A)  Final      Susceptibility   Enterobacter aerogenes - MIC*    CEFAZOLIN RESISTANT Resistant     CEFEPIME <=0.12 SENSITIVE Sensitive     CEFTRIAXONE <=0.25 SENSITIVE Sensitive     CIPROFLOXACIN <=0.25 SENSITIVE Sensitive     GENTAMICIN <=1 SENSITIVE Sensitive     IMIPENEM 1 SENSITIVE Sensitive     NITROFURANTOIN 64 INTERMEDIATE Intermediate     TRIMETH/SULFA <=20 SENSITIVE Sensitive     PIP/TAZO <=4 SENSITIVE Sensitive     * >=  100,000 COLONIES/mL ENTEROBACTER AEROGENES  Urine Culture     Status: Abnormal   Collection Time: 09/19/21  1:38 AM   Specimen: Urine, Clean Catch  Result Value Ref Range Status   Specimen Description   Final    URINE, CLEAN CATCH Performed at [facility], [place] [address], [place], [place] [address]    Special Requests   Final    NONE Performed at [facility], [place] [address] [place], [place] [address]    Culture >=100,000 COLONIES/mL ENTEROBACTER AEROGENES (A)  Final   Report Status 09/21/2021 FINAL  Final   Organism ID, Bacteria ENTEROBACTER AEROGENES (A)  Final      Susceptibility   Enterobacter aerogenes - MIC*    CEFAZOLIN RESISTANT Resistant     CEFEPIME <=0.12 SENSITIVE Sensitive     CEFTRIAXONE <=0.25 SENSITIVE Sensitive     CIPROFLOXACIN <=0.25 SENSITIVE Sensitive     GENTAMICIN <=1 SENSITIVE Sensitive     IMIPENEM 1 SENSITIVE Sensitive     NITROFURANTOIN 64 INTERMEDIATE Intermediate     TRIMETH/SULFA <=20 SENSITIVE Sensitive     PIP/TAZO <=4 SENSITIVE Sensitive     * >=100,000 COLONIES/mL ENTEROBACTER AEROGENES  MRSA Next Gen by PCR, Nasal     Status: None    Collection Time: 09/19/21  2:54 AM   Specimen: Nasal Mucosa; Nasal Swab  Result Value Ref Range Status   MRSA by PCR Next Gen NOT DETECTED NOT DETECTED Final    Comment: (NOTE) The GeneXpert MRSA Assay (FDA approved for NASAL specimens only), is one component of a comprehensive MRSA colonization surveillance program. It is not intended to diagnose MRSA infection nor to guide or monitor treatment for MRSA infections. Test performance is not FDA approved in patients less than 36 years old. Performed at [facility], [place] [address] [place],  [address]   Gastrointestinal Panel by PCR , Stool     Status: None   Collection Time: 09/19/21  9:15 AM   Specimen: Stool  Result Value Ref Range Status   Campylobacter species NOT DETECTED NOT DETECTED Final   Plesimonas shigelloides NOT DETECTED NOT DETECTED Final   Salmonella species NOT DETECTED NOT DETECTED Final   Yersinia enterocolitica NOT DETECTED NOT DETECTED Final   Vibrio species NOT DETECTED NOT DETECTED Final   Vibrio cholerae NOT DETECTED NOT DETECTED Final   Enteroaggregative E coli (EAEC) NOT DETECTED NOT DETECTED Final   Enteropathogenic E coli (EPEC) NOT DETECTED NOT DETECTED Final   Enterotoxigenic E coli (ETEC) NOT DETECTED NOT DETECTED Final   Shiga like toxin producing E coli (STEC) NOT DETECTED NOT DETECTED Final   Shigella/Enteroinvasive E coli (EIEC) NOT DETECTED NOT DETECTED Final   Cryptosporidium NOT DETECTED NOT DETECTED Final   Cyclospora cayetanensis NOT DETECTED NOT DETECTED Final   Entamoeba histolytica NOT DETECTED NOT DETECTED Final   Giardia lamblia NOT DETECTED NOT DETECTED Final   Adenovirus F40/41 NOT DETECTED NOT DETECTED Final   Astrovirus NOT DETECTED NOT DETECTED Final   Norovirus GI/GII NOT DETECTED NOT DETECTED Final   Rotavirus A NOT DETECTED NOT DETECTED Final   Sapovirus (I, II, IV, and V) NOT DETECTED NOT DETECTED Final    Comment: Performed at [facility],  [place] [place], [place] [address]  C Difficile Quick Screen w PCR reflex     Status: None   Collection Time: 09/19/21  9:15 AM  Result Value Ref Range Status   C Diff antigen NEGATIVE NEGATIVE Final  C Diff toxin NEGATIVE NEGATIVE Final   C Diff interpretation No C. difficile detected.  Final    Comment: Performed at [facility], [place] [address] [place], [place] [address]     Discharge Instructions:   Discharge Instructions     Diet - low sodium heart healthy   Complete by: As directed    Increase activity slowly   Complete by: As directed       Allergies as of 09/23/2021       Reactions   Penicillins Rash      Sulfa Antibiotics Rash   Sulfur Rash        Medication List     TAKE these medications    acidophilus Caps capsule Take 2 capsules by mouth daily. Start taking on: September 24, 2021   benztropine 1 MG tablet Commonly known as: COGENTIN Take 1 tablet (1 mg total) by mouth daily.   ciprofloxacin 500 MG tablet Commonly known as: CIPRO Take 1 tablet (500 mg total) by mouth 2 (two) times daily.   clobetasol 0.05 % external solution Commonly known as: TEMOVATE Apply 1 application. topically 2 (two) times daily.   colestipol 1 g tablet Commonly known as: COLESTID Take 1 tablet (1 g total) by mouth 2 (two) times daily.   dicyclomine 20 MG tablet Commonly known as: BENTYL Take 1 tablet (20 mg total) by mouth 3 (three) times daily before meals.   lisinopril 40 MG tablet Commonly known as: ZESTRIL Take 1 tablet (40 mg total) by mouth daily. Start taking on: September 24, 2021 What changed:  medication strength how much to take   loperamide 2 MG capsule Commonly known as: IMODIUM Take 2 capsules (4 mg total) by mouth as needed for diarrhea or loose stools.   magnesium gluconate 500 MG tablet Commonly known as: MAGONATE Take 500 mg by mouth 2 (two) times daily.   omeprazole 40 MG capsule Commonly known as: PRILOSEC Take 40  mg by mouth in the morning and at bedtime.   oxycodone 5 MG capsule Commonly known as: OXY-IR Take 5 mg by mouth every 12 (twelve) hours as needed for pain.   sertraline 100 MG tablet Commonly known as: ZOLOFT TAKE 2 TABLETS(200 MG) BY MOUTH DAILY   sucralfate 1 GM/10ML suspension Commonly known as: CARAFATE Take 10 mLs (1 g total) by mouth 4 (four) times daily -  with meals and at bedtime. What changed: when to take this   traZODone 100 MG tablet Commonly known as: DESYREL TAKE 2 TABLETS(200 MG) BY MOUTH AT BEDTIME AS NEEDED FOR SLEEP   Vitamin D (Ergocalciferol) 1.25 MG (50000 UNIT) Caps capsule Commonly known as: DRISDOL Take 50,000 Units by mouth once a week.   ziprasidone 80 MG capsule Commonly known as: GEODON Take 1 capsule (80 mg total) by mouth in the morning and at bedtime.          Time coordinating discharge: 35 min  Signed:  Geradine Girt DO  [facility] 09/23/2021, 9:12 AM

## 2021-10-06 ENCOUNTER — Inpatient Hospital Stay: Admission: RE | Admit: 2021-10-06 | Payer: Medicaid Other | Source: Ambulatory Visit

## 2021-10-16 ENCOUNTER — Inpatient Hospital Stay (HOSPITAL_COMMUNITY): Payer: Medicare Other

## 2021-10-16 ENCOUNTER — Inpatient Hospital Stay (HOSPITAL_COMMUNITY)
Admission: EM | Admit: 2021-10-16 | Discharge: 2021-10-22 | DRG: 381 | Disposition: A | Discharge status: Home / Self Care | Payer: Medicare Other | Attending: Internal Medicine | Admitting: Internal Medicine

## 2021-10-16 ENCOUNTER — Emergency Department (HOSPITAL_COMMUNITY): Payer: Medicare Other

## 2021-10-16 ENCOUNTER — Encounter (HOSPITAL_COMMUNITY): Payer: Self-pay

## 2021-10-16 DIAGNOSIS — R9431 Abnormal electrocardiogram [ECG] [EKG]: Secondary | ICD-10-CM | POA: Diagnosis present

## 2021-10-16 DIAGNOSIS — N179 Acute kidney failure, unspecified: Secondary | ICD-10-CM | POA: Diagnosis present

## 2021-10-16 DIAGNOSIS — T182XXA Foreign body in stomach, initial encounter: Secondary | ICD-10-CM | POA: Diagnosis not present

## 2021-10-16 DIAGNOSIS — B961 Klebsiella pneumoniae [K. pneumoniae] as the cause of diseases classified elsewhere: Secondary | ICD-10-CM | POA: Diagnosis present

## 2021-10-16 DIAGNOSIS — Z818 Family history of other mental and behavioral disorders: Secondary | ICD-10-CM

## 2021-10-16 DIAGNOSIS — K861 Other chronic pancreatitis: Secondary | ICD-10-CM | POA: Diagnosis present

## 2021-10-16 DIAGNOSIS — R197 Diarrhea, unspecified: Secondary | ICD-10-CM | POA: Diagnosis present

## 2021-10-16 DIAGNOSIS — G8929 Other chronic pain: Secondary | ICD-10-CM | POA: Diagnosis present

## 2021-10-16 DIAGNOSIS — Z87891 Personal history of nicotine dependence: Secondary | ICD-10-CM | POA: Diagnosis not present

## 2021-10-16 DIAGNOSIS — E876 Hypokalemia: Secondary | ICD-10-CM | POA: Diagnosis present

## 2021-10-16 DIAGNOSIS — K219 Gastro-esophageal reflux disease without esophagitis: Secondary | ICD-10-CM | POA: Diagnosis present

## 2021-10-16 DIAGNOSIS — K580 Irritable bowel syndrome with diarrhea: Secondary | ICD-10-CM | POA: Diagnosis present

## 2021-10-16 DIAGNOSIS — E860 Dehydration: Secondary | ICD-10-CM | POA: Diagnosis present

## 2021-10-16 DIAGNOSIS — Z79899 Other long term (current) drug therapy: Secondary | ICD-10-CM

## 2021-10-16 DIAGNOSIS — N390 Urinary tract infection, site not specified: Secondary | ICD-10-CM | POA: Diagnosis present

## 2021-10-16 DIAGNOSIS — F4310 Post-traumatic stress disorder, unspecified: Secondary | ICD-10-CM | POA: Diagnosis present

## 2021-10-16 DIAGNOSIS — Z880 Allergy status to penicillin: Secondary | ICD-10-CM

## 2021-10-16 DIAGNOSIS — I10 Essential (primary) hypertension: Secondary | ICD-10-CM | POA: Diagnosis present

## 2021-10-16 DIAGNOSIS — Z882 Allergy status to sulfonamides status: Secondary | ICD-10-CM | POA: Diagnosis not present

## 2021-10-16 DIAGNOSIS — R748 Abnormal levels of other serum enzymes: Secondary | ICD-10-CM | POA: Diagnosis present

## 2021-10-16 DIAGNOSIS — K3184 Gastroparesis: Secondary | ICD-10-CM | POA: Diagnosis present

## 2021-10-16 DIAGNOSIS — T18128A Food in esophagus causing other injury, initial encounter: Secondary | ICD-10-CM | POA: Diagnosis not present

## 2021-10-16 DIAGNOSIS — E871 Hypo-osmolality and hyponatremia: Secondary | ICD-10-CM | POA: Diagnosis present

## 2021-10-16 DIAGNOSIS — D649 Anemia, unspecified: Secondary | ICD-10-CM | POA: Diagnosis present

## 2021-10-16 DIAGNOSIS — F250 Schizoaffective disorder, bipolar type: Secondary | ICD-10-CM | POA: Diagnosis present

## 2021-10-16 DIAGNOSIS — K222 Esophageal obstruction: Secondary | ICD-10-CM | POA: Diagnosis present

## 2021-10-16 DIAGNOSIS — K208 Other esophagitis without bleeding: Secondary | ICD-10-CM | POA: Diagnosis not present

## 2021-10-16 DIAGNOSIS — F259 Schizoaffective disorder, unspecified: Secondary | ICD-10-CM | POA: Diagnosis not present

## 2021-10-16 DIAGNOSIS — K2210 Ulcer of esophagus without bleeding: Principal | ICD-10-CM | POA: Diagnosis present

## 2021-10-16 DIAGNOSIS — Z993 Dependence on wheelchair: Secondary | ICD-10-CM

## 2021-10-16 DIAGNOSIS — Z720 Tobacco use: Secondary | ICD-10-CM | POA: Diagnosis present

## 2021-10-16 DIAGNOSIS — F251 Schizoaffective disorder, depressive type: Secondary | ICD-10-CM

## 2021-10-16 DIAGNOSIS — K449 Diaphragmatic hernia without obstruction or gangrene: Secondary | ICD-10-CM | POA: Diagnosis present

## 2021-10-16 LAB — CK: Total CK: 498 U/L — ABNORMAL HIGH (ref 38–234)

## 2021-10-16 LAB — HEPATIC FUNCTION PANEL
ALT: 25 U/L (ref 0–44)
AST: 50 U/L — ABNORMAL HIGH (ref 15–41)
Albumin: 3.6 g/dL (ref 3.5–5.0)
Alkaline Phosphatase: 131 U/L — ABNORMAL HIGH (ref 38–126)
Bilirubin, Direct: 0.1 mg/dL (ref 0.0–0.2)
Indirect Bilirubin: 1.2 mg/dL — ABNORMAL HIGH (ref 0.3–0.9)
Total Bilirubin: 1.3 mg/dL — ABNORMAL HIGH (ref 0.3–1.2)
Total Protein: 6.6 g/dL (ref 6.5–8.1)

## 2021-10-16 LAB — LIPASE, BLOOD: Lipase: 19 U/L (ref 11–51)

## 2021-10-16 LAB — URINALYSIS, ROUTINE W REFLEX MICROSCOPIC
Bilirubin Urine: NEGATIVE
Glucose, UA: NEGATIVE mg/dL
Hgb urine dipstick: NEGATIVE
Ketones, ur: 20 mg/dL — AB
Leukocytes,Ua: NEGATIVE
Nitrite: NEGATIVE
Protein, ur: NEGATIVE mg/dL
Specific Gravity, Urine: 1.013 (ref 1.005–1.030)
pH: 5 (ref 5.0–8.0)

## 2021-10-16 LAB — BASIC METABOLIC PANEL
Anion gap: 12 (ref 5–15)
BUN: 19 mg/dL (ref 8–23)
CO2: 19 mmol/L — ABNORMAL LOW (ref 22–32)
Calcium: 8.5 mg/dL — ABNORMAL LOW (ref 8.9–10.3)
Chloride: 94 mmol/L — ABNORMAL LOW (ref 98–111)
Creatinine, Ser: 1.51 mg/dL — ABNORMAL HIGH (ref 0.44–1.00)
GFR, Estimated: 38 mL/min — ABNORMAL LOW (ref >60)
Glucose, Bld: 100 mg/dL — ABNORMAL HIGH (ref 70–99)
Potassium: 3 mmol/L — ABNORMAL LOW (ref 3.5–5.1)
Sodium: 125 mmol/L — ABNORMAL LOW (ref 135–145)

## 2021-10-16 LAB — COMPREHENSIVE METABOLIC PANEL
ALT: 28 U/L (ref 0–44)
AST: 65 U/L — ABNORMAL HIGH (ref 15–41)
Albumin: 4.3 g/dL (ref 3.5–5.0)
Alkaline Phosphatase: 153 U/L — ABNORMAL HIGH (ref 38–126)
Anion gap: 17 — ABNORMAL HIGH (ref 5–15)
BUN: 17 mg/dL (ref 8–23)
CO2: 20 mmol/L — ABNORMAL LOW (ref 22–32)
Calcium: 9.3 mg/dL (ref 8.9–10.3)
Chloride: 85 mmol/L — ABNORMAL LOW (ref 98–111)
Creatinine, Ser: 1.4 mg/dL — ABNORMAL HIGH (ref 0.44–1.00)
GFR, Estimated: 42 mL/min — ABNORMAL LOW (ref >60)
Glucose, Bld: 128 mg/dL — ABNORMAL HIGH (ref 70–99)
Potassium: 3.1 mmol/L — ABNORMAL LOW (ref 3.5–5.1)
Sodium: 122 mmol/L — ABNORMAL LOW (ref 135–145)
Total Bilirubin: 1.7 mg/dL — ABNORMAL HIGH (ref 0.3–1.2)
Total Protein: 7.7 g/dL (ref 6.5–8.1)

## 2021-10-16 LAB — CBC WITH DIFFERENTIAL/PLATELET
Abs Immature Granulocytes: 0.31 10*3/uL — ABNORMAL HIGH (ref 0.00–0.07)
Basophils Absolute: 0.1 10*3/uL (ref 0.0–0.1)
Basophils Relative: 1 %
Eosinophils Absolute: 0.1 10*3/uL (ref 0.0–0.5)
Eosinophils Relative: 1 %
HCT: 35.6 % — ABNORMAL LOW (ref 36.0–46.0)
Hemoglobin: 12.8 g/dL (ref 12.0–15.0)
Immature Granulocytes: 2 %
Lymphocytes Relative: 6 %
Lymphs Abs: 0.9 10*3/uL (ref 0.7–4.0)
MCH: 29.4 pg (ref 26.0–34.0)
MCHC: 36 g/dL (ref 30.0–36.0)
MCV: 81.8 fL (ref 80.0–100.0)
Monocytes Absolute: 0.7 10*3/uL (ref 0.1–1.0)
Monocytes Relative: 5 %
Neutro Abs: 11.6 10*3/uL — ABNORMAL HIGH (ref 1.7–7.7)
Neutrophils Relative %: 85 %
Platelets: 372 10*3/uL (ref 150–400)
RBC: 4.35 MIL/uL (ref 3.87–5.11)
RDW: 15.4 % (ref 11.5–15.5)
WBC: 13.6 10*3/uL — ABNORMAL HIGH (ref 4.0–10.5)
nRBC: 0 % (ref 0.0–0.2)

## 2021-10-16 LAB — BLOOD GAS, VENOUS
Acid-base deficit: 2.2 mmol/L — ABNORMAL HIGH (ref 0.0–2.0)
Bicarbonate: 17.9 mmol/L — ABNORMAL LOW (ref 20.0–28.0)
O2 Saturation: 81.3 %
Patient temperature: 37
pCO2, Ven: 20 mmHg — ABNORMAL LOW (ref 44–60)
pH, Ven: 7.56 — ABNORMAL HIGH (ref 7.25–7.43)
pO2, Ven: 42 mmHg (ref 32–45)

## 2021-10-16 LAB — LACTIC ACID, PLASMA: Lactic Acid, Venous: 0.5 mmol/L (ref 0.5–1.9)

## 2021-10-16 LAB — ETHANOL: Alcohol, Ethyl (B): <10 mg/dL (ref <10)

## 2021-10-16 LAB — PHOSPHORUS: Phosphorus: 3.5 mg/dL (ref 2.5–4.6)

## 2021-10-16 LAB — BETA-HYDROXYBUTYRIC ACID: Beta-Hydroxybutyric Acid: 3.18 mmol/L — ABNORMAL HIGH (ref 0.05–0.27)

## 2021-10-16 LAB — CBG MONITORING, ED: Glucose-Capillary: 103 mg/dL — ABNORMAL HIGH (ref 70–99)

## 2021-10-16 MED ORDER — GABAPENTIN 300 MG PO CAPS
300.0000 mg | ORAL_CAPSULE | Freq: Every day | ORAL | Status: DC
  Administered 2021-10-16 – 2021-10-21 (×6): 300 mg via ORAL
  Filled 2021-10-16 (×6): qty 1

## 2021-10-16 MED ORDER — POTASSIUM CHLORIDE 10 MEQ/100ML IV SOLN
10.0000 meq | INTRAVENOUS | Status: AC
  Administered 2021-10-16 (×2): 10 meq via INTRAVENOUS
  Filled 2021-10-16 (×2): qty 100

## 2021-10-16 MED ORDER — NICOTINE 14 MG/24HR TD PT24
14.0000 mg | MEDICATED_PATCH | Freq: Every day | TRANSDERMAL | Status: DC
  Administered 2021-10-16 – 2021-10-22 (×6): 14 mg via TRANSDERMAL
  Filled 2021-10-16 (×7): qty 1

## 2021-10-16 MED ORDER — SODIUM CHLORIDE 0.9 % IV BOLUS
1000.0000 mL | Freq: Once | INTRAVENOUS | Status: AC
  Administered 2021-10-16: 1000 mL via INTRAVENOUS

## 2021-10-16 MED ORDER — TRAZODONE HCL 100 MG PO TABS
200.0000 mg | ORAL_TABLET | Freq: Every day | ORAL | Status: DC
  Administered 2021-10-16 – 2021-10-21 (×6): 200 mg via ORAL
  Filled 2021-10-16 (×6): qty 2

## 2021-10-16 MED ORDER — FENTANYL CITRATE PF 50 MCG/ML IJ SOSY
50.0000 ug | PREFILLED_SYRINGE | Freq: Once | INTRAMUSCULAR | Status: AC
  Administered 2021-10-16: 50 ug via INTRAVENOUS
  Filled 2021-10-16: qty 1

## 2021-10-16 MED ORDER — SUCRALFATE 1 GM/10ML PO SUSP
1.0000 g | Freq: Four times a day (QID) | ORAL | Status: DC
Start: 2021-10-16 — End: 2021-10-22
  Administered 2021-10-16 – 2021-10-22 (×22): 1 g via ORAL
  Filled 2021-10-16 (×22): qty 10

## 2021-10-16 MED ORDER — ZIPRASIDONE HCL 20 MG PO CAPS: 80.0000 mg | ORAL_CAPSULE | Freq: Two times a day (BID) | ORAL | Status: DC

## 2021-10-16 MED ORDER — SODIUM CHLORIDE 0.9 % IV SOLN: INTRAVENOUS | Status: AC

## 2021-10-16 MED ORDER — COLESTIPOL HCL 1 G PO TABS
1.0000 g | ORAL_TABLET | Freq: Two times a day (BID) | ORAL | Status: DC
Start: 2021-10-16 — End: 2021-10-22
  Administered 2021-10-16 – 2021-10-22 (×11): 1 g via ORAL
  Filled 2021-10-16 (×13): qty 1

## 2021-10-16 MED ORDER — RISAQUAD PO CAPS
2.0000 | ORAL_CAPSULE | Freq: Every day | ORAL | Status: DC
  Administered 2021-10-17 – 2021-10-22 (×6): 2 via ORAL
  Filled 2021-10-16 (×6): qty 2

## 2021-10-16 MED ORDER — IOHEXOL 300 MG/ML  SOLN
80.0000 mL | Freq: Once | INTRAMUSCULAR | Status: AC | PRN
  Administered 2021-10-16: 80 mL via INTRAVENOUS

## 2021-10-16 MED ORDER — PANTOPRAZOLE SODIUM 40 MG PO TBEC
40.0000 mg | DELAYED_RELEASE_TABLET | Freq: Every day | ORAL | Status: DC
  Administered 2021-10-16 – 2021-10-18 (×3): 40 mg via ORAL
  Filled 2021-10-16 (×3): qty 1

## 2021-10-16 MED ORDER — HYDROCODONE-ACETAMINOPHEN 5-325 MG PO TABS
1.0000 | ORAL_TABLET | ORAL | Status: DC | PRN
  Administered 2021-10-16: 1 via ORAL
  Administered 2021-10-17 – 2021-10-18 (×6): 2 via ORAL
  Filled 2021-10-16: qty 2
  Filled 2021-10-16: qty 1
  Filled 2021-10-16 (×5): qty 2

## 2021-10-16 MED ORDER — ONDANSETRON 4 MG PO TBDP
4.0000 mg | ORAL_TABLET | Freq: Once | ORAL | Status: AC
  Administered 2021-10-16: 4 mg via ORAL
  Filled 2021-10-16: qty 1

## 2021-10-16 MED ORDER — BENZTROPINE MESYLATE 1 MG PO TABS
1.0000 mg | ORAL_TABLET | Freq: Every day | ORAL | Status: DC
  Administered 2021-10-16 – 2021-10-22 (×7): 1 mg via ORAL
  Filled 2021-10-16: qty 2
  Filled 2021-10-16 (×7): qty 1

## 2021-10-16 MED ORDER — MAGNESIUM GLUCONATE 500 MG PO TABS
500.0000 mg | ORAL_TABLET | Freq: Two times a day (BID) | ORAL | Status: DC
  Administered 2021-10-16 – 2021-10-22 (×12): 500 mg via ORAL
  Filled 2021-10-16 (×13): qty 1

## 2021-10-16 MED ORDER — ACETAMINOPHEN 650 MG RE SUPP: 650.0000 mg | Freq: Four times a day (QID) | RECTAL | Status: DC | PRN

## 2021-10-16 MED ORDER — ACETAMINOPHEN 325 MG PO TABS
650.0000 mg | ORAL_TABLET | Freq: Four times a day (QID) | ORAL | Status: DC | PRN
  Administered 2021-10-19 (×2): 650 mg via ORAL
  Filled 2021-10-16 (×2): qty 2

## 2021-10-16 MED ORDER — SERTRALINE HCL 100 MG PO TABS
100.0000 mg | ORAL_TABLET | Freq: Two times a day (BID) | ORAL | Status: DC
  Administered 2021-10-16 – 2021-10-22 (×12): 100 mg via ORAL
  Filled 2021-10-16 (×6): qty 1
  Filled 2021-10-16: qty 2
  Filled 2021-10-16 (×5): qty 1

## 2021-10-16 MED ORDER — LOPERAMIDE HCL 2 MG PO CAPS
2.0000 mg | ORAL_CAPSULE | ORAL | Status: DC | PRN
Start: 2021-10-16 — End: 2021-10-22
  Administered 2021-10-16: 2 mg via ORAL
  Filled 2021-10-16: qty 1

## 2021-10-16 MED ORDER — CYCLOBENZAPRINE HCL 5 MG PO TABS
5.0000 mg | ORAL_TABLET | Freq: Three times a day (TID) | ORAL | Status: DC | PRN
Start: 2021-10-16 — End: 2021-10-22
  Administered 2021-10-16 – 2021-10-20 (×6): 5 mg via ORAL
  Filled 2021-10-16 (×6): qty 1

## 2021-10-16 NOTE — Assessment & Plan Note (Signed)
Replace and recheck check magnesium level

## 2021-10-16 NOTE — Assessment & Plan Note (Signed)
Will rehydrate with IV fluids probably will benefit from orthostatic vital signs in a.m.

## 2021-10-16 NOTE — Assessment & Plan Note (Signed)
Hold Geodon due to QT prolongation

## 2021-10-16 NOTE — ED Provider Notes (Signed)
[facility] DEPT Provider Note   CSN: [id number] Arrival date & time: 10/16/21  1010     History  Chief Complaint  Patient presents with   Abdominal Pain    [name] is a 66 y.o. female history of schizoaffective disorder, prolonged QT, recurrent abdominal pain here for evaluation abdominal pain, nausea and vomiting.  Patient states has been present over the last month.  States she has nausea, vomiting with almost all of her meals as well as persistent watery diarrhea 2-3 times daily.  She states she was on antibiotics approximately 1 month ago for UTI.  At that time she was hospitalized for electrolyte abnormalities.  She was seen by GI who thought she likely had IBS-D. No recent medication changes. Denies Etoh use, fever, CP, SOB, melena or BRBPR. Feels lightheaded with ambulation. No syncope.  HPI     Home Medications Prior to Admission medications   Medication Sig Start Date End Date Taking? Authorizing Provider  benztropine (COGENTIN) 1 MG tablet Take 1 tablet (1 mg total) by mouth daily. 08/25/21  Yes [name], MD  cyclobenzaprine (FLEXERIL) 5 MG tablet Take 5 mg by mouth 3 (three) times daily as needed for muscle spasms.   Yes [provider]  gabapentin (NEURONTIN) 300 MG capsule Take 300 mg by mouth at bedtime. 10/02/21  Yes [provider]  lisinopril-hydrochlorothiazide (ZESTORETIC) 20-12.5 MG tablet Take 1 tablet by mouth daily. 10/03/21  Yes [provider]  magnesium gluconate (MAGONATE) 500 MG tablet Take 500 mg by mouth 2 (two) times daily.   Yes [provider]  meclizine (ANTIVERT) 25 MG tablet Take 25 mg by mouth daily as needed for dizziness.   Yes [provider]  omeprazole (PRILOSEC) 40 MG capsule Take 40 mg by mouth in the morning and at bedtime.   Yes [provider]  ondansetron (ZOFRAN-ODT) 4 MG disintegrating tablet Take 4 mg by mouth every 8 (eight) hours as  needed for nausea or vomiting. 10/05/21  Yes [provider]  sertraline (ZOLOFT) 100 MG tablet TAKE 2 TABLETS(200 MG) BY MOUTH DAILY Patient taking differently: Take 100 mg by mouth 2 (two) times daily. TAKE 2 TABLETS(200 MG) BY MOUTH DAILY 08/25/21  Yes [name], MD  sucralfate (CARAFATE) 1 GM/10ML suspension Take 10 mLs (1 g total) by mouth 4 (four) times daily -  with meals and at bedtime. Patient taking differently: Take 1 g by mouth 4 (four) times daily. 01/20/20 10/16/21 Yes Donne Hazel, MD  traZODone (DESYREL) 100 MG tablet TAKE 2 TABLETS(200 MG) BY MOUTH AT BEDTIME AS NEEDED FOR SLEEP Patient taking differently: Take 200 mg by mouth at bedtime. 08/25/21  Yes [name], MD  ziprasidone (GEODON) 80 MG capsule Take 1 capsule (80 mg total) by mouth in the morning and at bedtime. 08/25/21  Yes [name], MD  acidophilus (RISAQUAD) CAPS capsule Take 2 capsules by mouth daily. Patient not taking: Reported on 10/16/2021 09/24/21   Geradine Girt, DO  ciprofloxacin (CIPRO) 500 MG tablet Take 1 tablet (500 mg total) by mouth 2 (two) times daily. Patient not taking: Reported on 10/16/2021 09/23/21   Geradine Girt, DO  colestipol (COLESTID) 1 g tablet Take 1 tablet (1 g total) by mouth 2 (two) times daily. Patient not taking: Reported on 10/16/2021 09/23/21   Geradine Girt, DO  dicyclomine (BENTYL) 20 MG tablet Take 1 tablet (20 mg total) by mouth 3 (three) times daily before meals. Patient not  taking: Reported on 10/16/2021 09/23/21   Geradine Girt, DO  lisinopril (ZESTRIL) 40 MG tablet Take 1 tablet (40 mg total) by mouth daily. Patient not taking: Reported on 10/16/2021 09/24/21   Geradine Girt, DO  loperamide (IMODIUM) 2 MG capsule Take 2 capsules (4 mg total) by mouth as needed for diarrhea or loose stools. Patient not taking: Reported on 10/16/2021 09/23/21   Geradine Girt, DO  oxycodone (OXY-IR) 5 MG capsule Take 5 mg by mouth every 12 (twelve) hours as needed for  pain. Patient not taking: Reported on 10/16/2021    [provider]      Allergies    Penicillins and Sulfa antibiotics    Review of Systems   Review of Systems  Constitutional: Negative.   HENT: Negative.    Respiratory: Negative.    Cardiovascular: Negative.   Gastrointestinal:  Positive for abdominal pain, diarrhea, nausea and vomiting. Negative for abdominal distention, anal bleeding, blood in stool, constipation and rectal pain.  Genitourinary: Negative.   Musculoskeletal: Negative.   Skin: Negative.   Neurological:  Positive for weakness and light-headedness.  All other systems reviewed and are negative.   Physical Exam Updated Vital Signs BP (!) 102/52   Pulse 63   Temp 97.7 F (36.5 C) (Oral)   Resp 20   SpO2 98%  Physical Exam Vitals and nursing note reviewed.  Constitutional:      General: She is not in acute distress.    Appearance: She is well-developed. She is ill-appearing. She is not toxic-appearing or diaphoretic.  HENT:     Head: Atraumatic.  Eyes:     Pupils: Pupils are equal, round, and reactive to light.  Cardiovascular:     Rate and Rhythm: Normal rate.     Pulses: Normal pulses.          Radial pulses are 2+ on the right side and 2+ on the left side.       Dorsalis pedis pulses are 2+ on the right side and 2+ on the left side.     Heart sounds: Normal heart sounds.  Pulmonary:     Effort: Pulmonary effort is normal. No respiratory distress.     Breath sounds: Normal breath sounds.  Abdominal:     General: Bowel sounds are normal. There is no distension.     Palpations: Abdomen is soft.     Tenderness: There is generalized abdominal tenderness and tenderness in the epigastric area. There is no right CVA tenderness, left CVA tenderness, guarding or rebound.  Musculoskeletal:        General: Normal range of motion.     Cervical back: Normal range of motion.  Skin:    General: Skin is warm and dry.     Capillary Refill: Capillary refill  takes less than 2 seconds.  Neurological:     General: No focal deficit present.     Mental Status: She is alert and oriented to person, place, and time.  Psychiatric:        Mood and Affect: Mood normal.    ED Results / Procedures / Treatments   Labs (all labs ordered are listed, but only abnormal results are displayed) Labs Reviewed  CBC WITH DIFFERENTIAL/PLATELET - Abnormal; Notable for the following components:      Result Value   WBC 13.6 (*)    HCT 35.6 (*)    Neutro Abs 11.6 (*)    Abs Immature Granulocytes 0.31 (*)    All other components  within normal limits  COMPREHENSIVE METABOLIC PANEL - Abnormal; Notable for the following components:   Sodium 122 (*)    Potassium 3.1 (*)    Chloride 85 (*)    CO2 20 (*)    Glucose, Bld 128 (*)    Creatinine, Ser 1.40 (*)    AST 65 (*)    Alkaline Phosphatase 153 (*)    Total Bilirubin 1.7 (*)    GFR, Estimated 42 (*)    Anion gap 17 (*)    All other components within normal limits  URINALYSIS, ROUTINE W REFLEX MICROSCOPIC - Abnormal; Notable for the following components:   APPearance HAZY (*)    Ketones, ur 20 (*)    All other components within normal limits  BLOOD GAS, VENOUS - Abnormal; Notable for the following components:   pH, Ven 7.56 (*)    pCO2, Ven 20 (*)    Bicarbonate 17.9 (*)    Acid-base deficit 2.2 (*)    All other components within normal limits  BETA-HYDROXYBUTYRIC ACID - Abnormal; Notable for the following components:   Beta-Hydroxybutyric Acid 3.18 (*)    All other components within normal limits  BASIC METABOLIC PANEL - Abnormal; Notable for the following components:   Sodium 125 (*)    Potassium 3.0 (*)    Chloride 94 (*)    CO2 19 (*)    Glucose, Bld 100 (*)    Creatinine, Ser 1.51 (*)    Calcium 8.5 (*)    GFR, Estimated 38 (*)    All other components within normal limits  CBG MONITORING, ED - Abnormal; Notable for the following components:   Glucose-Capillary 103 (*)    All other components  within normal limits  GASTROINTESTINAL PANEL BY PCR, STOOL (REPLACES STOOL CULTURE)  LIPASE, BLOOD  LACTIC ACID, PLASMA  MAGNESIUM  CK  HEPATIC FUNCTION PANEL  PHOSPHORUS  OSMOLALITY, URINE  PREALBUMIN  SODIUM, URINE, RANDOM  URINALYSIS, COMPLETE (UACMP) WITH MICROSCOPIC  CREATININE, URINE, RANDOM  RAPID URINE DRUG SCREEN, HOSP PERFORMED  ETHANOL  BASIC METABOLIC PANEL  BASIC METABOLIC PANEL  LACTIC ACID, PLASMA  COMPREHENSIVE METABOLIC PANEL  CBC  MAGNESIUM  PHOSPHORUS  OSMOLALITY    EKG None  Radiology DG CHEST PORT 1 VIEW  Result Date: 10/16/2021 CLINICAL DATA:  Hyponatremia.  Upper abdominal pain.  Nausea. EXAM: PORTABLE CHEST 1 VIEW COMPARISON:  09/19/2021. FINDINGS: The heart is normal in size.The cardiomediastinal contours are normal. Mild biapical pleuroparenchymal scarring. Pulmonary vasculature is normal. No consolidation, pleural effusion, or pneumothorax. No acute osseous abnormalities are seen. IMPRESSION: No acute chest findings. Electronically Signed   By: Keith Rake M.D.   On: 10/16/2021 20:46   CT ABDOMEN PELVIS W CONTRAST  Result Date: 10/16/2021 CLINICAL DATA:  Abdominal pain. EXAM: CT ABDOMEN AND PELVIS WITH CONTRAST TECHNIQUE: Multidetector CT imaging of the abdomen and pelvis was performed using the standard protocol following bolus administration of intravenous contrast. RADIATION DOSE REDUCTION: This exam was performed according to the departmental dose-optimization program which includes automated exposure control, adjustment of the mA and/or kV according to patient size and/or use of iterative reconstruction technique. CONTRAST:  27m OMNIPAQUE IOHEXOL 300 MG/ML  SOLN COMPARISON:  CT abdomen pelvis dated September 18, 2021. FINDINGS: Lower chest: No acute abnormality. 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 Hepatobiliary: No focal liver abnormality is seen. Status post cholecystectomy. No biliary dilatation. Pancreas: Unremarkable. No  pancreatic ductal dilatation or surrounding inflammatory changes. Spleen: Normal in size without focal abnormality. Adrenals/Urinary Tract: Adrenal  glands are unremarkable. Unchanged 6.5 cm simple cyst in the left kidney. No follow-up imaging is recommended. No renal calculi or hydronephrosis. The bladder is decompressed. Stomach/Bowel: Stomach is within normal limits. Appendix appears normal. No evidence of bowel wall thickening, distention, or inflammatory changes. Mild sigmoid colonic diverticulosis. Vascular/Lymphatic: Aortic atherosclerosis. No enlarged abdominal or pelvic lymph nodes. Reproductive: Uterus and bilateral adnexa are unremarkable. Other: No abdominal wall hernia or abnormality. No abdominopelvic ascites. No pneumoperitoneum. Musculoskeletal: Unchanged subacute to chronic bilateral sacral ala insufficiency fractures. IMPRESSION: 1. No acute intra-abdominal process. 2. Unchanged subacute to chronic bilateral sacral ala insufficiency fractures. 3. Aortic Atherosclerosis (ICD10-I70.0). Electronically Signed   By: Titus Dubin M.D.   On: 10/16/2021 17:37    Procedures .Critical Care  Performed by: Nettie Elm, PA-C Authorized by: Nettie Elm, PA-C   Critical care provider statement:    Critical care time (minutes):  35   Critical care was necessary to treat or prevent imminent or life-threatening deterioration of the following conditions:  Metabolic crisis   Critical care was time spent personally by me on the following activities:  Development of treatment plan with patient or surrogate, discussions with consultants, evaluation of patient's response to treatment, examination of patient, ordering and review of laboratory studies, ordering and review of radiographic studies, ordering and performing treatments and interventions, pulse oximetry, re-evaluation of patient's condition and review of old charts     Medications Ordered in ED Medications  benztropine (COGENTIN)  tablet 1 mg (1 mg Oral Given 10/16/21 2100)  cyclobenzaprine (FLEXERIL) tablet 5 mg (5 mg Oral Given 10/16/21 2059)  gabapentin (NEURONTIN) capsule 300 mg (300 mg Oral Given 10/16/21 2229)  magnesium gluconate (MAGONATE) tablet 500 mg (500 mg Oral Given 10/16/21 2232)  pantoprazole (PROTONIX) EC tablet 40 mg (40 mg Oral Given 10/16/21 2100)  sertraline (ZOLOFT) tablet 100 mg (100 mg Oral Given 10/16/21 2229)  sucralfate (CARAFATE) 1 GM/10ML suspension 1 g (1 g Oral Given 10/16/21 2232)  traZODone (DESYREL) tablet 200 mg (200 mg Oral Given 10/16/21 2231)  0.9 %  sodium chloride infusion ( Intravenous New Bag/Given 10/16/21 2213)  acetaminophen (TYLENOL) tablet 650 mg (has no administration in time range)    Or  acetaminophen (TYLENOL) suppository 650 mg (has no administration in time range)  HYDROcodone-acetaminophen (NORCO/VICODIN) 5-325 MG per tablet 1-2 tablet (1 tablet Oral Given 10/16/21 2101)  nicotine (NICODERM CQ - dosed in mg/24 hours) patch 14 mg (14 mg Transdermal Patch Applied 10/16/21 2234)  colestipol (COLESTID) tablet 1 g (1 g Oral Given 10/16/21 2314)  acidophilus (RISAQUAD) capsule 2 capsule (has no administration in time range)  loperamide (IMODIUM) capsule 2 mg (2 mg Oral Given 10/16/21 2231)  ondansetron (ZOFRAN-ODT) disintegrating tablet 4 mg (4 mg Oral Given 10/16/21 1023)  iohexol (OMNIPAQUE) 300 MG/ML solution 80 mL (80 mLs Intravenous Contrast Given 10/16/21 1714)  sodium chloride 0.9 % bolus 1,000 mL (0 mLs Intravenous Stopped 10/16/21 2106)  potassium chloride 10 mEq in 100 mL IVPB (0 mEq Intravenous Stopped 10/16/21 2037)  fentaNYL (SUBLIMAZE) injection 50 mcg (50 mcg Intravenous Given 10/16/21 1823)    ED Course/ Medical Decision Making/ A&P    66 year old history of prolonged QT interval, schizophrenia, recurrent nausea vomiting diarrhea here for evaluation of abdominal pain.  Patient states she has had recurrent abdominal pain, nausea, vomiting, diarrhea over the last month.  Was admitted 3  weeks ago for similar complaints as well as metabolic issues.  Was seen by GI at that time thought  likely due to gastroparesis and IBS-D.  Patient states symptoms persisted.  She is having multiple episodes of NB emesis daily.  Tenderness epigastric region.  She appears clinically dry.  Odd affect however does not appear confused.  Plan on labs, imaging and reassess  Labs and imaging personally viewed and interpreted:  CBC leukocytosis 13.6 CMP hyponatremia 122, potassium 3.1, creatinine 1.4, Elevated LFT, Anion gap 17 VBG pH 7.5 Lipase 19 Lactic 0.5 CT abdomen pelvis without acute abnormality  Patient reassessed.  Given IV fluids.  Suspect acute on chronic abdominal pain.  Severe electrolyte abnormalities will be admitted for such  The patient appears reasonably stabilized for admission considering the current resources, flow, and capabilities available in the ED at this time, and I doubt any other [facility] requiring further screening and/or treatment in the ED prior to admission.                             Medical Decision Making Amount and/or Complexity of Data Reviewed External Data Reviewed: labs, radiology, ECG and notes. Labs: ordered. Decision-making details documented in ED Course. Radiology: ordered and independent interpretation performed. Decision-making details documented in ED Course. ECG/medicine tests: ordered and independent interpretation performed. Decision-making details documented in ED Course.  Risk OTC drugs. Prescription drug management. Parenteral controlled substances. Drug therapy requiring intensive monitoring for toxicity. Decision regarding hospitalization. Diagnosis or treatment significantly limited by social determinants of health.          Final Clinical Impression(s) / ED Diagnoses Final diagnoses:  Hyponatremia  Hypokalemia  AKI (acute kidney injury) ([place])  Dehydration    Rx / DC Orders ED Discharge Orders     None          Derriona Branscom A, PA-C 10/16/21 2338    Blanchie Dessert, MD 10/19/21 [phone]

## 2021-10-16 NOTE — H&P (Signed)
[name] IRS:[id number] DOB: Jan 08, 1956 DOA: 10/16/2021     PCP: Bartholome Bill, MD     Patient arrived to ER on 10/16/21 at 1010 Referred by Attending Blanchie Dessert, MD   Patient coming from:    home Lives  alone    Chief Complaint:   Chief Complaint  Patient presents with   Abdominal Pain    HPI: [name] is a 66 y.o. female with medical history significant of PTSD, schizoaffective disorder, depression, IBS, chronic unsteady gait, HTN, pancreatitis, tobacco abuse  Presented with   of nausea, vomiting, diarrhea, abdominal pain off and on for the past month admitted for the same 3 wks ago Came in with nausea and vomiitng Can not keep anyhitng down  Diarrhea  Sodium back down again  No recent medication changes  GI have seen her during recent admit felt it was gastroparesis Continues to smoke  States she does not drink ETOH  States she is having chills  Reports she have had 2 bms today that were dark  During her last admission CT abdomen showed no abnormality C. difficile at the time as well as gastric panel negative Patient has history of prolonged QTc so attempted to decrease her antiemetics  Patient was seen in consult by GI was discharged home with dicyclomine 20 mg 3 times a day and colestipol 1 g twice daily she is supposed to have followed up awake for his GI unsure if she actually did  Patient is unable to provide detailed history  Regarding pertinent Chronic problems:    HTN on lisinopril     Chronic anemia - baseline hg Hemoglobin & Hematocrit  Recent Labs    09/21/21 0303 09/22/21 0429 10/16/21 1042  HGB 9.3* 9.8* 12.8   Schizoaffective disorder on Cogentin and Geodon        While in ER:    CT abd non acute but  NA down to 120    CTabd/pelvis -  non acute    Following Medications were ordered in ER: Medications  potassium chloride 10 mEq in 100 mL IVPB (10 mEq Intravenous New Bag/Given 10/16/21 1826)  ondansetron  (ZOFRAN-ODT) disintegrating tablet 4 mg (4 mg Oral Given 10/16/21 1023)  iohexol (OMNIPAQUE) 300 MG/ML solution 80 mL (80 mLs Intravenous Contrast Given 10/16/21 1714)  sodium chloride 0.9 % bolus 1,000 mL (1,000 mLs Intravenous New Bag/Given 10/16/21 1708)  fentaNYL (SUBLIMAZE) injection 50 mcg (50 mcg Intravenous Given 10/16/21 1823)       ED Triage Vitals [10/16/21 1017]  Enc Vitals Group     BP (!) 89/62     Pulse Rate 75     Resp 16     Temp 97.8 F (36.6 C)     Temp Source Oral     SpO2 97 %     Weight      Height      Head Circumference      Peak Flow      Pain Score 10     Pain Loc      Pain Edu?      Excl. in [place]?   [id number](81)@     _________________________________________ Significant initial  Findings: Abnormal Labs Reviewed  CBC WITH DIFFERENTIAL/PLATELET - Abnormal; Notable for the following components:      Result Value   WBC 13.6 (*)    HCT 35.6 (*)    Neutro Abs 11.6 (*)    Abs Immature Granulocytes 0.31 (*)    All  other components within normal limits  COMPREHENSIVE METABOLIC PANEL - Abnormal; Notable for the following components:   Sodium 122 (*)    Potassium 3.1 (*)    Chloride 85 (*)    CO2 20 (*)    Glucose, Bld 128 (*)    Creatinine, Ser 1.40 (*)    AST 65 (*)    Alkaline Phosphatase 153 (*)    Total Bilirubin 1.7 (*)    GFR, Estimated 42 (*)    Anion gap 17 (*)    All other components within normal limits  URINALYSIS, ROUTINE W REFLEX MICROSCOPIC - Abnormal; Notable for the following components:   APPearance HAZY (*)    Ketones, ur 20 (*)    All other components within normal limits  BLOOD GAS, VENOUS - Abnormal; Notable for the following components:   pH, Ven 7.56 (*)    pCO2, Ven 20 (*)    Bicarbonate 17.9 (*)    Acid-base deficit 2.2 (*)    All other components within normal limits     ECG: Ordered Personally reviewed and interpreted by me showing: HR : 65 Rhythm:Sinus rhythm Prolonged QT interval QTC 504     The recent clinical data  is shown below. Vitals:   10/16/21 1017 10/16/21 1407 10/16/21 1700 10/16/21 1815  BP: (!) 89/62 (!) 106/55 105/60 (!) 115/59  Pulse: 75 70 (!) 59 66  Resp: 16 18 (!) 26 14  Temp: 97.8 F (36.6 C) 97.7 F (36.5 C)    TempSrc: Oral Oral    SpO2: 97% 97% 98% 91%    WBC     Component Value Date/Time   WBC 13.6 (H) 10/16/2021 1042   LYMPHSABS 0.9 10/16/2021 1042   LYMPHSABS 1.8 05/03/2017 0940   MONOABS 0.7 10/16/2021 1042   EOSABS 0.1 10/16/2021 1042   EOSABS 0.1 05/03/2017 0940   BASOSABS 0.1 10/16/2021 1042   BASOSABS 0.1 05/03/2017 0940      UA   no evidence of UTI      Urine analysis:    Component Value Date/Time   COLORURINE YELLOW 10/16/2021 1021   APPEARANCEUR HAZY (A) 10/16/2021 1021   LABSPEC 1.013 10/16/2021 1021   PHURINE 5.0 10/16/2021 1021   GLUCOSEU NEGATIVE 10/16/2021 1021   HGBUR NEGATIVE 10/16/2021 1021   BILIRUBINUR NEGATIVE 10/16/2021 1021   KETONESUR 20 (A) 10/16/2021 1021   PROTEINUR NEGATIVE 10/16/2021 1021   UROBILINOGEN 1.0 09/14/2008 2239   NITRITE NEGATIVE 10/16/2021 1021   LEUKOCYTESUR NEGATIVE 10/16/2021 1021    Results for orders placed or performed during the hospital encounter of 09/18/21  Urine Culture     Status: Abnormal   Collection Time: 09/18/21 11:00 PM   Specimen: Urine, Clean Catch  Result Value Ref Range Status   Specimen Description   Final    URINE, CLEAN CATCH Performed at [facility], [address], [place], [place] [address]    Special Requests   Final    NONE Performed at [facility], [address], [place], [place] [address]    Culture >=100,000 COLONIES/mL ENTEROBACTER AEROGENES (A)  Final   Report Status 09/21/2021 FINAL  Final   Organism ID, Bacteria ENTEROBACTER AEROGENES (A)  Final      Susceptibility   Enterobacter aerogenes - MIC*    CEFAZOLIN RESISTANT Resistant     CEFEPIME <=0.12 SENSITIVE Sensitive     CEFTRIAXONE <=0.25 SENSITIVE Sensitive      CIPROFLOXACIN <=0.25 SENSITIVE Sensitive     GENTAMICIN <=1 SENSITIVE Sensitive  IMIPENEM 1 SENSITIVE Sensitive     NITROFURANTOIN 64 INTERMEDIATE Intermediate     TRIMETH/SULFA <=20 SENSITIVE Sensitive     PIP/TAZO <=4 SENSITIVE Sensitive     * >=100,000 COLONIES/mL ENTEROBACTER AEROGENES  Urine Culture     Status: Abnormal   Collection Time: 09/19/21  1:38 AM   Specimen: Urine, Clean Catch  Result Value Ref Range Status   Specimen Description   Final    URINE, CLEAN CATCH Performed at [facility], [place] [address], [place], [place] [address]    Special Requests   Final    NONE Performed at [facility], [place] [address] [place], [place] [address]    Culture >=100,000 COLONIES/mL ENTEROBACTER AEROGENES (A)  Final   Report Status 09/21/2021 FINAL  Final   Organism ID, Bacteria ENTEROBACTER AEROGENES (A)  Final      Susceptibility   Enterobacter aerogenes - MIC*    CEFAZOLIN RESISTANT Resistant     CEFEPIME <=0.12 SENSITIVE Sensitive     CEFTRIAXONE <=0.25 SENSITIVE Sensitive     CIPROFLOXACIN <=0.25 SENSITIVE Sensitive     GENTAMICIN <=1 SENSITIVE Sensitive     IMIPENEM 1 SENSITIVE Sensitive     NITROFURANTOIN 64 INTERMEDIATE Intermediate     TRIMETH/SULFA <=20 SENSITIVE Sensitive     PIP/TAZO <=4 SENSITIVE Sensitive     * >=100,000 COLONIES/mL ENTEROBACTER AEROGENES  MRSA Next Gen by PCR, Nasal     Status: None   Collection Time: 09/19/21  2:54 AM   Specimen: Nasal Mucosa; Nasal Swab  Result Value Ref Range Status   MRSA by PCR Next Gen NOT DETECTED NOT DETECTED Final    Comment: (NOTE) The GeneXpert MRSA Assay (FDA approved for NASAL specimens only), is one component of a comprehensive MRSA colonization surveillance program. It is not intended to diagnose MRSA infection nor to guide or monitor treatment for MRSA infections. Test performance is not FDA approved in patients less than 18 years old. Performed at [facility], [place] [address], [place], [place] [address]   Gastrointestinal Panel by PCR , Stool     Status: None   Collection Time: 09/19/21  9:15 AM   Specimen: Stool  Result Value Ref Range Status   Campylobacter species NOT DETECTED NOT DETECTED Final   Plesimonas shigelloides NOT DETECTED NOT DETECTED Final   Salmonella species NOT DETECTED NOT DETECTED Final   Yersinia enterocolitica NOT DETECTED NOT DETECTED Final   Vibrio species NOT DETECTED NOT DETECTED Final   Vibrio cholerae NOT DETECTED NOT DETECTED Final   Enteroaggregative E coli (EAEC) NOT DETECTED NOT DETECTED Final   Enteropathogenic E coli (EPEC) NOT DETECTED NOT DETECTED Final   Enterotoxigenic E coli (ETEC) NOT DETECTED NOT DETECTED Final   Shiga like toxin producing E coli (STEC) NOT DETECTED NOT DETECTED Final   Shigella/Enteroinvasive E coli (EIEC) NOT DETECTED NOT DETECTED Final   Cryptosporidium NOT DETECTED NOT DETECTED Final   Cyclospora cayetanensis NOT DETECTED NOT DETECTED Final   Entamoeba histolytica NOT DETECTED NOT DETECTED Final   Giardia lamblia NOT DETECTED NOT DETECTED Final   Adenovirus F40/41 NOT DETECTED NOT DETECTED Final   Astrovirus NOT DETECTED NOT DETECTED Final   Norovirus GI/GII NOT DETECTED NOT DETECTED Final   Rotavirus A NOT DETECTED NOT DETECTED Final   Sapovirus (I, II, IV, and V) NOT DETECTED NOT DETECTED Final    Comment: Performed at [facility], [address], [place], [place] [address]  C Difficile Quick Screen  w PCR reflex     Status: None   Collection Time: 09/19/21  9:15 AM  Result Value Ref Range Status   C Diff antigen NEGATIVE NEGATIVE Final   C Diff toxin NEGATIVE NEGATIVE Final   C Diff interpretation No C. difficile detected.  Final    Comment: Performed at [facility], [place] [address], [place], [place] [address]     _______________________________________________ Hospitalist was called for admission for   Hyponatremia  Hypokalemia,   AKI (acute kidney injury).  Dehydration      The following Work up has been ordered so far:  Orders Placed This Encounter  Procedures   CT ABDOMEN PELVIS W CONTRAST   CBC with Differential   Comprehensive metabolic panel   Lipase, blood   Urinalysis, Routine w reflex microscopic   Magnesium   Blood gas, venous (at [facility] and [facility], not at [facility])   In and Out Cath   Consult to hospitalist   ED EKG   Insert peripheral IV     OTHER Significant initial  Findings:  labs showing:    Recent Labs  Lab 10/16/21 1042  NA 122*  K 3.1*  CO2 20*  GLUCOSE 128*  BUN 17  CREATININE 1.40*  CALCIUM 9.3    Cr  Up from baseline see below Lab Results  Component Value Date   CREATININE 1.40 (H) 10/16/2021   CREATININE 0.52 09/22/2021   CREATININE 0.59 09/21/2021    Recent Labs  Lab 10/16/21 1042  AST 65*  ALT 28  ALKPHOS 153*  BILITOT 1.7*  PROT 7.7  ALBUMIN 4.3   Lab Results  Component Value Date   CALCIUM 9.3 10/16/2021   PHOS 3.2 09/21/2021          Plt: Lab Results  Component Value Date   PLT 372 10/16/2021      Venous  Blood Gas result:  pH   7.56 High  Acid-base deficit 2.2 High  mmol/L  pCO2, Ven 20 Low  mmHg O2 Saturation 81.3 %  pO2, Ven 42 mmHg Patient temperature 37.0   Bicarbonate 17.9 Low  mmol/L         Recent Labs  Lab 10/16/21 1042  WBC 13.6*  NEUTROABS 11.6*  HGB 12.8  HCT 35.6*  MCV 81.8  PLT 372    HG/HCT stable,      Component Value Date/Time   HGB 12.8 10/16/2021 1042   HGB 14.3 05/03/2017 0940   HCT 35.6 (L) 10/16/2021 1042   HCT 42.6 05/03/2017 0940   MCV 81.8 10/16/2021 1042   MCV 92 05/03/2017 0940      Recent Labs  Lab 10/16/21 1042  LIPASE 19    Cardiac Panel (last 3 results) No results for input(s): "CKTOTAL", "CKMB", "TROPONINI", "RELINDX" in the last 72 hours.    DM  labs:  HbA1C: Recent Labs    08/29/21 1122  HGBA1C 5.6       CBG (last 3)  No results for input(s): "GLUCAP" in the last 72 hours.         Cultures:    Component Value Date/Time   SDES  09/19/2021 0138    URINE, CLEAN CATCH Performed at [facility], [place] [address], [place], [place] [address]    SPECREQUEST  09/19/2021 0138    NONE Performed at [facility], [place] [address] [place], [place] [address]    CULT >=100,000 COLONIES/mL ENTEROBACTER AEROGENES (A) 09/19/2021 0138   REPTSTATUS 09/21/2021 FINAL 09/19/2021  [id number]     Radiological Exams on Admission: DG CHEST PORT 1 VIEW  Result Date: 10/16/2021 CLINICAL DATA:  Hyponatremia.  Upper abdominal pain.  Nausea. EXAM: PORTABLE CHEST 1 VIEW COMPARISON:  09/19/2021. FINDINGS: The heart is normal in size.The cardiomediastinal contours are normal. Mild biapical pleuroparenchymal scarring. Pulmonary vasculature is normal. No consolidation, pleural effusion, or pneumothorax. No acute osseous abnormalities are seen. IMPRESSION: No acute chest findings. Electronically Signed   By: Keith Rake M.D.   On: 10/16/2021 20:46   CT ABDOMEN PELVIS W CONTRAST  Result Date: 10/16/2021 CLINICAL DATA:  Abdominal pain. EXAM: CT ABDOMEN AND PELVIS WITH CONTRAST TECHNIQUE: Multidetector CT imaging of the abdomen and pelvis was performed using the standard protocol following bolus administration of intravenous contrast. RADIATION DOSE REDUCTION: This exam was performed according to the departmental dose-optimization program which includes automated exposure control, adjustment of the mA and/or kV according to patient size and/or use of iterative reconstruction technique. CONTRAST:  22m OMNIPAQUE IOHEXOL 300 MG/ML  SOLN COMPARISON:  CT abdomen pelvis dated September 18, 2021. FINDINGS: Lower chest: No acute abnormality. 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 Hepatobiliary: No focal liver abnormality is seen. Status post cholecystectomy. No biliary dilatation. Pancreas: Unremarkable. No pancreatic ductal dilatation or surrounding inflammatory  changes. Spleen: Normal in size without focal abnormality. Adrenals/Urinary Tract: Adrenal glands are unremarkable. Unchanged 6.5 cm simple cyst in the left kidney. No follow-up imaging is recommended. No renal calculi or hydronephrosis. The bladder is decompressed. Stomach/Bowel: Stomach is within normal limits. Appendix appears normal. No evidence of bowel wall thickening, distention, or inflammatory changes. Mild sigmoid colonic diverticulosis. Vascular/Lymphatic: Aortic atherosclerosis. No enlarged abdominal or pelvic lymph nodes. Reproductive: Uterus and bilateral adnexa are unremarkable. Other: No abdominal wall hernia or abnormality. No abdominopelvic ascites. No pneumoperitoneum. Musculoskeletal: Unchanged subacute to chronic bilateral sacral ala insufficiency fractures. IMPRESSION: 1. No acute intra-abdominal process. 2. Unchanged subacute to chronic bilateral sacral ala insufficiency fractures. 3. Aortic Atherosclerosis (ICD10-I70.0). Electronically Signed   By: WTitus DubinM.D.   On: 10/16/2021 17:37   _______________________________________________________________________________________________________ Latest  Blood pressure (!) 115/59, pulse 66, temperature 97.7 F (36.5 C), temperature source Oral, resp. rate 14, SpO2 91 %.   Vitals  labs and radiology finding personally reviewed  Review of Systems:    Pertinent positives include:  chills, fatigue,  indigestion, abdominal pain, nausea, vomiting, diarrhea, Constitutional:  No weight loss, night sweats, Fevers,  weight loss  HEENT:  No headaches, Difficulty swallowing,Tooth/dental problems,Sore throat,  No sneezing, itching, ear ache, nasal congestion, post nasal drip,  Cardio-vascular:  No chest pain, Orthopnea, PND, anasarca, dizziness, palpitations.no Bilateral lower extremity swelling  GI:  No heartburn, change in bowel habits, loss of appetite, melena, blood in stool, hematemesis Resp:  no shortness of breath at rest. No  dyspnea on exertion, No excess mucus, no productive cough, No non-productive cough, No coughing up of blood.No change in color of mucus.No wheezing. Skin:  no rash or lesions. No jaundice GU:  no dysuria, change in color of urine, no urgency or frequency. No straining to urinate.  No flank pain.  Musculoskeletal:  No joint pain or no joint swelling. No decreased range of motion. No back pain.  Psych:  No change in mood or affect. No depression or anxiety. No memory loss.  Neuro: no localizing neurological complaints, no tingling, no weakness, no double vision, no gait abnormality, no slurred speech, no confusion  All systems reviewed and apart from HTopawaall are negative _______________________________________________________________________________________________ Past Medical  History:   Past Medical History:  Diagnosis Date   DDD (degenerative disc disease), lumbar    Depression    Diverticulosis    Dizziness    GERD (gastroesophageal reflux disease)    IBS (irritable bowel syndrome)    Internal hemorrhoid    2nd degree   Myocarditis ([place]) 2009   Osteoarthritis    Prolapsed internal hemorrhoids, grade 3 12/01/2015   Recurrent falls    Schizoaffective disorder ([place])    Tubular adenoma of colon 08/2011    Past Surgical History:  Procedure Laterality Date   BREAST BIOPSY Right 12/27/2012   CHOLECYSTECTOMY     HEMORRHOID BANDING     INTRAMEDULLARY (IM) NAIL INTERTROCHANTERIC Left 01/24/2020   Procedure: INTRAMEDULLARY (IM) NAIL INTERTROCHANTRIC;  Surgeon: Leandrew Koyanagi, MD;  Location: [facility];  Service: Orthopedics;  Laterality: Left;    Social History:  Ambulatory  walker  wheelchair bound     reports that she quit smoking about 2 years ago. Her smoking use included cigarettes. She has a 19.00 pack-year smoking history. She has never used smokeless tobacco. She reports that she does not drink alcohol and does not use drugs.    Family History:   Family History  Problem  Relation Age of Onset   Suicidality Father    Depression Father    Suicidality Sister    Depression Sister    Suicidality Cousin    Suicidality Other    ______________________________________________________________________________________________ Allergies: Allergies  Allergen Reactions   Penicillins Rash          Sulfa Antibiotics Rash     Prior to Admission medications   Medication Sig Start Date End Date Taking? Authorizing Provider  benztropine (COGENTIN) 1 MG tablet Take 1 tablet (1 mg total) by mouth daily. 08/25/21  Yes [name], MD  cyclobenzaprine (FLEXERIL) 5 MG tablet Take 5 mg by mouth 3 (three) times daily as needed for muscle spasms.   Yes [provider]  gabapentin (NEURONTIN) 300 MG capsule Take 300 mg by mouth at bedtime. 10/02/21  Yes [provider]  lisinopril-hydrochlorothiazide (ZESTORETIC) 20-12.5 MG tablet Take 1 tablet by mouth daily. 10/03/21  Yes [provider]  magnesium gluconate (MAGONATE) 500 MG tablet Take 500 mg by mouth 2 (two) times daily.   Yes [provider]  meclizine (ANTIVERT) 25 MG tablet Take 25 mg by mouth daily as needed for dizziness.   Yes [provider]  omeprazole (PRILOSEC) 40 MG capsule Take 40 mg by mouth in the morning and at bedtime.   Yes [provider]  ondansetron (ZOFRAN-ODT) 4 MG disintegrating tablet Take 4 mg by mouth every 8 (eight) hours as needed for nausea or vomiting. 10/05/21  Yes [provider]  sertraline (ZOLOFT) 100 MG tablet TAKE 2 TABLETS(200 MG) BY MOUTH DAILY Patient taking differently: Take 100 mg by mouth 2 (two) times daily. TAKE 2 TABLETS(200 MG) BY MOUTH DAILY 08/25/21  Yes [name], MD  sucralfate (CARAFATE) 1 GM/10ML suspension Take 10 mLs (1 g total) by mouth 4 (four) times daily -  with meals and at bedtime. Patient taking differently: Take 1 g by mouth 4 (four) times daily. 01/20/20 10/16/21 Yes Donne Hazel, MD   traZODone (DESYREL) 100 MG tablet TAKE 2 TABLETS(200 MG) BY MOUTH AT BEDTIME AS NEEDED FOR SLEEP Patient taking differently: Take 200 mg by mouth at bedtime. 08/25/21  Yes [name], MD  ziprasidone (GEODON) 80 MG capsule Take 1 capsule (80 mg total) by mouth  in the morning and at bedtime. 08/25/21  Yes [name], MD  acidophilus (RISAQUAD) CAPS capsule Take 2 capsules by mouth daily. Patient not taking: Reported on 10/16/2021 09/24/21   Geradine Girt, DO  ciprofloxacin (CIPRO) 500 MG tablet Take 1 tablet (500 mg total) by mouth 2 (two) times daily. Patient not taking: Reported on 10/16/2021 09/23/21   Geradine Girt, DO  colestipol (COLESTID) 1 g tablet Take 1 tablet (1 g total) by mouth 2 (two) times daily. Patient not taking: Reported on 10/16/2021 09/23/21   Geradine Girt, DO  dicyclomine (BENTYL) 20 MG tablet Take 1 tablet (20 mg total) by mouth 3 (three) times daily before meals. Patient not taking: Reported on 10/16/2021 09/23/21   Geradine Girt, DO  lisinopril (ZESTRIL) 40 MG tablet Take 1 tablet (40 mg total) by mouth daily. Patient not taking: Reported on 10/16/2021 09/24/21   Geradine Girt, DO  loperamide (IMODIUM) 2 MG capsule Take 2 capsules (4 mg total) by mouth as needed for diarrhea or loose stools. Patient not taking: Reported on 10/16/2021 09/23/21   Geradine Girt, DO  oxycodone (OXY-IR) 5 MG capsule Take 5 mg by mouth every 12 (twelve) hours as needed for pain. Patient not taking: Reported on 10/16/2021    [provider]    ___________________________________________________________________________________________________ Physical Exam:    10/16/2021    6:15 PM 10/16/2021    5:00 PM 10/16/2021    2:07 PM  Vitals with BMI  Systolic [phone]  Diastolic 59 60 55  Pulse 66 59 70     1. General:  in   Acute distress complaining of severe pain    Chronically ill   -appearing 2. Psychological: Alert and   Oriented 3. Head/ENT:   Dry Mucous Membranes                           Head Non traumatic, neck supple                          Poor Dentition 4. SKIN decreased Skin turgor,  Skin clean Dry and intact no rash 5. Heart: Regular rate and rhythm no  Murmur, no Rub or gallop 6. Lungs , no wheezes or crackles   7. Abdomen: Soft,  non-tender, Non distended   obese  bowel sounds present 8. Lower extremities: no clubbing, cyanosis, no  edema 9. Neurologically Grossly intact, moving all 4 extremities equally   10. MSK: Normal range of motion    Chart has been reviewed  ______________________________________________________________________________________________  Assessment/Plan 66 y.o. female with medical history significant of PTSD, schizoaffective disorder, depression, IBS, chronic unsteady gait, HTN, pancreatitis, tobacco abuse  Admitted for   Hyponatremia  Hypokalemia AKI (acute kidney injury) ([place]) Dehydration,   Present on Admission:  Hyponatremia  Dehydration  HTN (hypertension)  QT prolongation  Hypokalemia  Schizoaffective disorder, bipolar type ([place])  Diarrhea  AKI (acute kidney injury) ([place])   Dehydration Will rehydrate with IV fluids probably will benefit from orthostatic vital signs in a.m.  HTN (hypertension) Hold lisinopril  Hyponatremia  in the setting of dehydration.  Obtain electrolytes follow serial BMET 6 hours rehydrate follow fluid status   obtain urine electrolytes Check TSH and  obtain CXR    QT prolongation - will monitor on tele avoid QT prolonging medications, rehydrate correct electrolytes   Hypokalemia Replace and recheck check magnesium level  Schizoaffective disorder, bipolar  type ([place]) Hold Geodon due to QT prolongation   Diarrhea Patient stylets with now only having 2 BMs a day She has not been taking her acidophilus Has not been taking her colestipol or Imodium Can try to resume see if that improves her diarrhea C. difficile in the past as well as gastric panel has been  negative  AKI (acute kidney injury) ([place]) We will rehydrate obtain electrolytes and follow    Other plan as per orders.  DVT prophylaxis:  SCD        Code Status:    Code Status: Prior FULL CODE  as per patient   I had personally discussed CODE STATUS with patient      Family Communication:   Family not at  Bedside    Disposition Plan:                               To home once workup is complete and patient is stable   Following barriers for discharge:                            Electrolytes corrected                                                          Pain controlled with PO medications                                                   Would benefit from PT/OT eval prior to DC  Ordered                                       Transition of care consulted                   Nutrition    consulted                                   Consults called: none  Admission status:  ED Disposition     ED Disposition  Admit   Condition  --   [place]: [place] [[id number]]  Level of Care: Progressive [102]  Admit to Progressive based on following criteria: NEPHROLOGY stable condition requiring close monitoring for AKI, requiring Hemodialysis or Peritoneal Dialysis either from expected electrolyte imbalance, acidosis, or fluid overload that can be managed by NIPPV or high flow oxygen.  May admit patient to [facility] or [facility] if equivalent level of care is available:: No  Covid Evaluation: Asymptomatic - no recent exposure (last 10 days) testing not required  Diagnosis: Hyponatremia [[id number]]  Admitting Physician: Toy Baker [[id number]]  Attending Physician: Toy Baker [[id number]]  Certification:: I certify this patient will need inpatient services for at least 2 midnights  Estimated Length of Stay: 2           inpatient     I Expect 2 midnight stay  secondary to severity of patient's current illness need for inpatient  interventions justified by the following:     Severe lab/radiological/exam abnormalities including:   Hyponatremia and extensive comorbidities including:  Chronic pain     That are currently affecting medical management.   I expect  patient to be hospitalized for 2 midnights requiring inpatient medical care.  Patient is at high risk for adverse outcome (such as loss of life or disability) if not treated.  Indication for inpatient stay as follows:    Need for frequent labs   Need for , IV fluids,    Level of care         progressive tele indefinitely please discontinue once patient no longer qualifies COVID-19 Labs     Finneus Kaneshiro 10/16/2021, 10:22 PM    [facility]     after 2 AM please page floor coverage PA If 7AM-7PM, please contact the day team taking care of the patient using [email or web address]   Patient was evaluated in the context of the global COVID-19 pandemic, which necessitated consideration that the patient might be at risk for infection with the SARS-CoV-2 virus that causes COVID-19. Institutional protocols and algorithms that pertain to the evaluation of patients at risk for COVID-19 are in a state of rapid change based on information released by regulatory bodies including the CDC and federal and state organizations. These policies and algorithms were followed during the patient's care.

## 2021-10-16 NOTE — ED Triage Notes (Signed)
Pt presents with c/o abdominal pain, nausea associated but no vomiting. Pt reports a hx of pancreatitis and says that it feels the same.

## 2021-10-16 NOTE — Assessment & Plan Note (Signed)
Hold lisinopril

## 2021-10-16 NOTE — Assessment & Plan Note (Signed)
in the setting of dehydration.  Obtain electrolytes follow serial BMET 6 hours rehydrate follow fluid status   obtain urine electrolytes Check TSH and  obtain CXR

## 2021-10-16 NOTE — Subjective & Objective (Signed)
Came in with nausea and vomiitng Can not keep anyhitng down  Diarrhea  Sodium back down again  No recent medication changes  GI have seen her during recent admit felt it was gastroparesis

## 2021-10-16 NOTE — Assessment & Plan Note (Signed)
We will rehydrate obtain electrolytes and follow

## 2021-10-16 NOTE — Assessment & Plan Note (Signed)
-   will monitor on tele avoid QT prolonging medications, rehydrate correct electrolytes ? ?

## 2021-10-16 NOTE — Assessment & Plan Note (Signed)
-   Spoke about importance of quitting spent 5 minutes discussing options for treatment, prior attempts at quitting, and dangers of smoking ? -At this point patient is    interested in quitting ? - order nicotine patch  ? - nursing tobacco cessation protocol ? ?

## 2021-10-16 NOTE — ED Provider Triage Note (Signed)
Emergency Medicine Provider Triage Evaluation Note  [name] , a 66 y.o. female  was evaluated in triage.  Pt complains of abd pain. Recurrent upper abd pain with nausea vomiting and diarrhea x 1 month.  Felt similar to pancreatitis.  Denies heavy alcohol use.  Denies fever, dysuria, cp, cough  Review of Systems  Positive: As above Negative: As above  Physical Exam  There were no vitals taken for this visit. Gen:   Awake, no distress   Resp:  Normal effort  MSK:   Moves extremities without difficulty  Other:    Medical Decision Making  Medically screening exam initiated at 10:18 AM.  Appropriate orders placed.  [name] was informed that the remainder of the evaluation will be completed by another provider, this initial triage assessment does not replace that evaluation, and the importance of remaining in the ED until their evaluation is complete.     Domenic Moras, PA-C 10/16/21 1020

## 2021-10-16 NOTE — Assessment & Plan Note (Addendum)
Patient stylets with now only having 2 BMs a day She has not been taking her acidophilus Has not been taking her colestipol or Imodium Can try to resume see if that improves her diarrhea C. difficile in the past as well as gastric panel has been negative

## 2021-10-17 ENCOUNTER — Other Ambulatory Visit: Payer: Self-pay

## 2021-10-17 ENCOUNTER — Encounter (HOSPITAL_COMMUNITY): Payer: Self-pay | Admitting: Internal Medicine

## 2021-10-17 DIAGNOSIS — R748 Abnormal levels of other serum enzymes: Secondary | ICD-10-CM | POA: Diagnosis present

## 2021-10-17 DIAGNOSIS — E871 Hypo-osmolality and hyponatremia: Secondary | ICD-10-CM | POA: Diagnosis not present

## 2021-10-17 LAB — RAPID URINE DRUG SCREEN, HOSP PERFORMED
Amphetamines: NOT DETECTED
Barbiturates: NOT DETECTED
Benzodiazepines: NOT DETECTED
Cocaine: NOT DETECTED
Opiates: NOT DETECTED
Tetrahydrocannabinol: NOT DETECTED

## 2021-10-17 LAB — COMPREHENSIVE METABOLIC PANEL
ALT: 22 U/L (ref 0–44)
AST: 42 U/L — ABNORMAL HIGH (ref 15–41)
Albumin: 3.3 g/dL — ABNORMAL LOW (ref 3.5–5.0)
Alkaline Phosphatase: 125 U/L (ref 38–126)
Anion gap: 11 (ref 5–15)
BUN: 16 mg/dL (ref 8–23)
CO2: 19 mmol/L — ABNORMAL LOW (ref 22–32)
Calcium: 8.8 mg/dL — ABNORMAL LOW (ref 8.9–10.3)
Chloride: 99 mmol/L (ref 98–111)
Creatinine, Ser: 1.16 mg/dL — ABNORMAL HIGH (ref 0.44–1.00)
GFR, Estimated: 52 mL/min — ABNORMAL LOW (ref >60)
Glucose, Bld: 90 mg/dL (ref 70–99)
Potassium: 3.5 mmol/L (ref 3.5–5.1)
Sodium: 129 mmol/L — ABNORMAL LOW (ref 135–145)
Total Bilirubin: 1 mg/dL (ref 0.3–1.2)
Total Protein: 5.9 g/dL — ABNORMAL LOW (ref 6.5–8.1)

## 2021-10-17 LAB — CBC
HCT: 30.1 % — ABNORMAL LOW (ref 36.0–46.0)
Hemoglobin: 10.4 g/dL — ABNORMAL LOW (ref 12.0–15.0)
MCH: 29.3 pg (ref 26.0–34.0)
MCHC: 34.6 g/dL (ref 30.0–36.0)
MCV: 84.8 fL (ref 80.0–100.0)
Platelets: 246 10*3/uL (ref 150–400)
RBC: 3.55 MIL/uL — ABNORMAL LOW (ref 3.87–5.11)
RDW: 15.6 % — ABNORMAL HIGH (ref 11.5–15.5)
WBC: 10.7 10*3/uL — ABNORMAL HIGH (ref 4.0–10.5)
nRBC: 0 % (ref 0.0–0.2)

## 2021-10-17 LAB — CK: Total CK: 374 U/L — ABNORMAL HIGH (ref 38–234)

## 2021-10-17 LAB — URINALYSIS, ROUTINE W REFLEX MICROSCOPIC
Bilirubin Urine: NEGATIVE
Glucose, UA: NEGATIVE mg/dL
Hgb urine dipstick: NEGATIVE
Ketones, ur: 5 mg/dL — AB
Leukocytes,Ua: NEGATIVE
Nitrite: POSITIVE — AB
Protein, ur: NEGATIVE mg/dL
Specific Gravity, Urine: 1.01 (ref 1.005–1.030)
pH: 6 (ref 5.0–8.0)

## 2021-10-17 LAB — URINALYSIS, COMPLETE (UACMP) WITH MICROSCOPIC
Bacteria, UA: NONE SEEN
Bilirubin Urine: NEGATIVE
Glucose, UA: NEGATIVE mg/dL
Hgb urine dipstick: NEGATIVE
Ketones, ur: 5 mg/dL — AB
Leukocytes,Ua: NEGATIVE
Nitrite: NEGATIVE
Protein, ur: NEGATIVE mg/dL
Specific Gravity, Urine: 1.021 (ref 1.005–1.030)
pH: 5 (ref 5.0–8.0)

## 2021-10-17 LAB — GASTROINTESTINAL PANEL BY PCR, STOOL (REPLACES STOOL CULTURE)

## 2021-10-17 LAB — BASIC METABOLIC PANEL
Anion gap: 9 (ref 5–15)
Anion gap: 7 (ref 5–15)
BUN: 12 mg/dL (ref 8–23)
BUN: 9 mg/dL (ref 8–23)
CO2: 20 mmol/L — ABNORMAL LOW (ref 22–32)
CO2: 20 mmol/L — ABNORMAL LOW (ref 22–32)
Calcium: 8.8 mg/dL — ABNORMAL LOW (ref 8.9–10.3)
Calcium: 8.7 mg/dL — ABNORMAL LOW (ref 8.9–10.3)
Chloride: 102 mmol/L (ref 98–111)
Chloride: 104 mmol/L (ref 98–111)
Creatinine, Ser: 0.98 mg/dL (ref 0.44–1.00)
Creatinine, Ser: 0.82 mg/dL (ref 0.44–1.00)
GFR, Estimated: >60 mL/min (ref >60)
GFR, Estimated: >60 mL/min (ref >60)
Glucose, Bld: 109 mg/dL — ABNORMAL HIGH (ref 70–99)
Glucose, Bld: 107 mg/dL — ABNORMAL HIGH (ref 70–99)
Potassium: 3.5 mmol/L (ref 3.5–5.1)
Potassium: 3.1 mmol/L — ABNORMAL LOW (ref 3.5–5.1)
Sodium: 131 mmol/L — ABNORMAL LOW (ref 135–145)
Sodium: 131 mmol/L — ABNORMAL LOW (ref 135–145)

## 2021-10-17 LAB — OSMOLALITY: Osmolality: 267 mOsm/kg — ABNORMAL LOW (ref 275–295)

## 2021-10-17 LAB — MAGNESIUM
Magnesium: 1.8 mg/dL (ref 1.7–2.4)
Magnesium: 1.8 mg/dL (ref 1.7–2.4)

## 2021-10-17 LAB — OSMOLALITY, URINE: Osmolality, Ur: 215 mOsm/kg — ABNORMAL LOW (ref 300–900)

## 2021-10-17 LAB — PHOSPHORUS: Phosphorus: 3.7 mg/dL (ref 2.5–4.6)

## 2021-10-17 LAB — CREATININE, URINE, RANDOM: Creatinine, Urine: 44 mg/dL

## 2021-10-17 LAB — PREALBUMIN: Prealbumin: 15.4 mg/dL — ABNORMAL LOW (ref 18–38)

## 2021-10-17 LAB — CORTISOL: Cortisol, Plasma: 22.1 ug/dL

## 2021-10-17 LAB — SODIUM, URINE, RANDOM: Sodium, Ur: 21 mmol/L

## 2021-10-17 MED ORDER — POTASSIUM CHLORIDE 10 MEQ/100ML IV SOLN
10.0000 meq | INTRAVENOUS | Status: AC
  Administered 2021-10-17 (×4): 10 meq via INTRAVENOUS
  Filled 2021-10-17 (×4): qty 100

## 2021-10-17 MED ORDER — SODIUM CHLORIDE 0.9 % IV SOLN: INTRAVENOUS | Status: AC

## 2021-10-17 MED ORDER — ORAL CARE MOUTH RINSE: 15.0000 mL | OROMUCOSAL | Status: DC | PRN

## 2021-10-17 MED ORDER — ADULT MULTIVITAMIN W/MINERALS CH
1.0000 | ORAL_TABLET | Freq: Every day | ORAL | Status: DC
  Administered 2021-10-17 – 2021-10-22 (×6): 1 via ORAL
  Filled 2021-10-17 (×6): qty 1

## 2021-10-17 MED ORDER — PNEUMOCOCCAL 20-VAL CONJ VACC 0.5 ML IM SUSY
0.5000 mL | PREFILLED_SYRINGE | INTRAMUSCULAR | Status: DC
  Filled 2021-10-17: qty 0.5

## 2021-10-17 MED ORDER — THIAMINE HCL 100 MG/ML IJ SOLN
100.0000 mg | INTRAMUSCULAR | Status: DC
Start: 2021-10-17 — End: 2021-10-21
  Administered 2021-10-17 – 2021-10-21 (×5): 100 mg via INTRAVENOUS
  Filled 2021-10-17 (×5): qty 2

## 2021-10-17 MED ORDER — BOOST / RESOURCE BREEZE PO LIQD CUSTOM
1.0000 | Freq: Three times a day (TID) | ORAL | Status: DC
  Administered 2021-10-17 – 2021-10-22 (×9): 1 via ORAL

## 2021-10-17 MED ORDER — DICYCLOMINE HCL 10 MG PO CAPS
10.0000 mg | ORAL_CAPSULE | Freq: Three times a day (TID) | ORAL | Status: DC
  Administered 2021-10-17 – 2021-10-22 (×20): 10 mg via ORAL
  Filled 2021-10-17 (×25): qty 1

## 2021-10-17 NOTE — Progress Notes (Signed)
PROGRESS NOTE  [name]:[id number] DOB: 1955-04-23 DOA: 10/16/2021 PCP: Bartholome Bill, MD   LOS: 1 day   Brief Narrative / Interim history: 66 year old lady with history of PTSD, schizoaffective disorder, depression, IBS, chronic pancreatitis, gastroparesis, tobacco use comes into the hospital with complaints of nausea, vomiting, diarrhea and abdominal cramping for the past month.  She apparently was admitted to the hospital for the same symptoms 3 weeks ago, was seen by GI and felt to be IBS-D.  Improved with supportive treatment and discharged home, however her symptoms recurred.  She now reports that she has been having little to no p.o. intake, ongoing nausea and vomiting, and diarrhea with dark stools.  Subjective / 24h Interval events: A little drowsy this morning  Assesement and Plan: Principal Problem:   Hyponatremia Active Problems:   Dehydration   HTN (hypertension)   Hypokalemia   Schizoaffective disorder, bipolar type (HCC)   QT prolongation   Diarrhea   AKI (acute kidney injury) ([place])   Tobacco abuse   Elevated CK   Principal problem Intractable nausea and vomiting-GI consulted, appreciate input.  Continue colestipol, dicyclomine.  May be multifactorial in the setting of known gastroparesis as well as IBS-D.  For completeness check a cortisol level  Active problems Hyponatremia-this is in the setting of dehydration, reported poor p.o. intake over the last several weeks.  Sodium improved with fluids, 129 this morning.  Continue fluids  Hypokalemia-potassium improved with repletion, 3.5 this morning  Acute kidney injury-creatinine 1.5 on admission, improved to 1.1 this morning after fluids.  Continue fluids  QT prolongation-monitor, avoid QT prolonging medications.  QT 504  Schizoaffective disorder, bipolar-Geodon on hold due to QT prolongation, she is also very sleepy this morning.  Continue sertraline  Leukocytosis-likely reactive, improving  today  Mild normocytic anemia-possibly dilutional due to IV fluids  Scheduled Meds:  acidophilus  2 capsule Oral Daily   benztropine  1 mg Oral Daily   colestipol  1 g Oral BID   dicyclomine  10 mg Oral TID AC & HS   gabapentin  300 mg Oral QHS   magnesium gluconate  500 mg Oral BID   nicotine  14 mg Transdermal Daily   pantoprazole  40 mg Oral Daily   [START ON 10/18/2021] pneumococcal 20-valent conjugate vaccine  0.5 mL Intramuscular Tomorrow-1000   sertraline  100 mg Oral BID   sucralfate  1 g Oral QID   thiamine injection  100 mg Intravenous Q24H   traZODone  200 mg Oral QHS   Continuous Infusions: PRN Meds:.acetaminophen **OR** acetaminophen, cyclobenzaprine, HYDROcodone-acetaminophen, loperamide, mouth rinse  Diet Orders (From admission, onward)     Start     Ordered   10/17/21 0042  Diet Heart Room service appropriate? Yes; Fluid consistency: Thin  Diet effective now       Question Answer Comment  Room service appropriate? Yes   Fluid consistency: Thin      10/17/21 0041            DVT prophylaxis: SCDs Start: 10/16/21 2036   Lab Results  Component Value Date   PLT 246 10/17/2021      Code Status: Full Code  Family Communication: no family at bedside  Status is: Inpatient Remains inpatient appropriate because: persistent symptoms  Level of care: Telemetry  Consultants:  GI  Objective: Vitals:   10/17/21 0100 10/17/21 0120 10/17/21 0126 10/17/21 0509  BP: (!) 99/58  (!) 122/52 (!) 104/51  Pulse: 62  68 72  Resp: '12  20 19  '$ Temp: 97.7 F (36.5 C)  97.9 F (36.6 C) 98.4 F (36.9 C)  TempSrc: Oral  Oral   SpO2: 97%  100% 95%  Weight:  65 kg    Height:  '5\' 2"'$  (1.575 m)      Intake/Output Summary (Last 24 hours) at 10/17/2021 1122 Last data filed at 10/17/2021 7681 Gross per 24 hour  Intake 2590.77 ml  Output 600 ml  Net 1990.77 ml   Wt Readings from Last 3 Encounters:  10/17/21 65 kg  09/19/21 71.7 kg  08/29/21 72 kg     Examination:  Constitutional: NAD Eyes: no scleral icterus ENMT: Mucous membranes are moist.  Neck: normal, supple Respiratory: clear to auscultation bilaterally, no wheezing, no crackles.  Cardiovascular: Regular rate and rhythm, no murmurs / rubs / gallops.  Abdomen: non distended, no tenderness. Bowel sounds positive.  Musculoskeletal: no clubbing / cyanosis.  Skin: no rashes Neurologic: non focal  Data Reviewed: I have independently reviewed following labs and imaging studies   CBC Recent Labs  Lab 10/16/21 1042 10/17/21 0535  WBC 13.6* 10.7*  HGB 12.8 10.4*  HCT 35.6* 30.1*  PLT 372 246  MCV 81.8 84.8  MCH 29.4 29.3  MCHC 36.0 34.6  RDW 15.4 15.6*  LYMPHSABS 0.9  --   MONOABS 0.7  --   EOSABS 0.1  --   BASOSABS 0.1  --     Recent Labs  Lab 10/16/21 1042 10/16/21 2230 10/16/21 2253 10/16/21 2319 10/17/21 0535  NA 122*  --  125*  --  129*  K 3.1*  --  3.0*  --  3.5  CL 85*  --  94*  --  99  CO2 20*  --  19*  --  19*  GLUCOSE 128*  --  100*  --  90  BUN 17  --  19  --  16  CREATININE 1.40*  --  1.51*  --  1.16*  CALCIUM 9.3  --  8.5*  --  8.8*  AST 65*  --   --  50* 42*  ALT 28  --   --  25 22  ALKPHOS 153*  --   --  131* 125  BILITOT 1.7*  --   --  1.3* 1.0  ALBUMIN 4.3  --   --  3.6 3.3*  MG  --   --   --  1.8 1.8  LATICACIDVEN  --  0.5  --   --   --     ------------------------------------------------------------------------------------------------------------------ No results for input(s): "CHOL", "HDL", "LDLCALC", "TRIG", "CHOLHDL", "LDLDIRECT" in the last 72 hours.  Lab Results  Component Value Date   HGBA1C 5.6 08/29/2021   ------------------------------------------------------------------------------------------------------------------ No results for input(s): "TSH", "T4TOTAL", "T3FREE", "THYROIDAB" in the last 72 hours.  Invalid input(s): "FREET3"  Cardiac Enzymes No results for input(s): "CKMB", "TROPONINI", "MYOGLOBIN" in the  last 168 hours.  Invalid input(s): "CK" ------------------------------------------------------------------------------------------------------------------    Component Value Date/Time   BNP 76.0 01/08/2020 1450    CBG: Recent Labs  Lab 10/16/21 2147  GLUCAP 103*    No results found for this or any previous visit (from the past 240 hour(s)).   Radiology Studies: DG CHEST PORT 1 VIEW  Result Date: 10/16/2021 CLINICAL DATA:  Hyponatremia.  Upper abdominal pain.  Nausea. EXAM: PORTABLE CHEST 1 VIEW COMPARISON:  09/19/2021. FINDINGS: The heart is normal in size.The cardiomediastinal contours are normal. Mild biapical pleuroparenchymal scarring. Pulmonary vasculature is normal. No consolidation, pleural effusion,  or pneumothorax. No acute osseous abnormalities are seen. IMPRESSION: No acute chest findings. Electronically Signed   By: Keith Rake M.D.   On: 10/16/2021 20:46   CT ABDOMEN PELVIS W CONTRAST  Result Date: 10/16/2021 CLINICAL DATA:  Abdominal pain. EXAM: CT ABDOMEN AND PELVIS WITH CONTRAST TECHNIQUE: Multidetector CT imaging of the abdomen and pelvis was performed using the standard protocol following bolus administration of intravenous contrast. RADIATION DOSE REDUCTION: This exam was performed according to the departmental dose-optimization program which includes automated exposure control, adjustment of the mA and/or kV according to patient size and/or use of iterative reconstruction technique. CONTRAST:  30m OMNIPAQUE IOHEXOL 300 MG/ML  SOLN COMPARISON:  CT abdomen pelvis dated September 18, 2021. FINDINGS: Lower chest: No acute abnormality. 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 Hepatobiliary: No focal liver abnormality is seen. Status post cholecystectomy. No biliary dilatation. Pancreas: Unremarkable. No pancreatic ductal dilatation or surrounding inflammatory changes. Spleen: Normal in size without focal abnormality. Adrenals/Urinary Tract: Adrenal glands are  unremarkable. Unchanged 6.5 cm simple cyst in the left kidney. No follow-up imaging is recommended. No renal calculi or hydronephrosis. The bladder is decompressed. Stomach/Bowel: Stomach is within normal limits. Appendix appears normal. No evidence of bowel wall thickening, distention, or inflammatory changes. Mild sigmoid colonic diverticulosis. Vascular/Lymphatic: Aortic atherosclerosis. No enlarged abdominal or pelvic lymph nodes. Reproductive: Uterus and bilateral adnexa are unremarkable. Other: No abdominal wall hernia or abnormality. No abdominopelvic ascites. No pneumoperitoneum. Musculoskeletal: Unchanged subacute to chronic bilateral sacral ala insufficiency fractures. IMPRESSION: 1. No acute intra-abdominal process. 2. Unchanged subacute to chronic bilateral sacral ala insufficiency fractures. 3. Aortic Atherosclerosis (ICD10-I70.0). Electronically Signed   By: WTitus DubinM.D.   On: 10/16/2021 17:37     CMarzetta Board MD, PhD [facility]  Between 7 am - 7 pm I am available, please contact me via Amion (for emergencies) or Securechat (non urgent messages)  Between 7 pm - 7 am I am not available, please contact night coverage MD/APP via Amion

## 2021-10-17 NOTE — Assessment & Plan Note (Signed)
Will rehydrate and recheck

## 2021-10-17 NOTE — Evaluation (Signed)
Physical Therapy Evaluation Patient Details Name: [name] MRN: [id number] DOB: [birth year] Today's Date: 10/17/2021  History of Present Illness  66 yo female admitted with hyponatremia, weakness. Pt reports falls at home prior to admission. Hx of L hip IM nail, PTSD, schizophrenia, recurrent N/V/D, pancreatitis (Simultaneous filing. User may not have seen previous data.)  Clinical Impression  On eval, pt was Min guard assist for mobility. She walked ~75 feet with a RW. Pt presents with general weakness, decreased activity tolerance, and impaired gait and balance. She reports sustaining a few falls just prior to admission. Will plan to follow pt during this hospital stay. Will recommend HHPT f/u at this time.     Recommendations for follow up therapy are one component of a multi-disciplinary discharge planning process, led by the attending physician.  Recommendations may be updated based on patient status, additional functional criteria and insurance authorization.  Follow Up Recommendations Home health PT      Assistance Recommended at Discharge Intermittent Supervision/Assistance  Patient can return home with the following  A little help with walking and/or transfers;Assist for transportation    Equipment Recommendations None recommended by PT  Recommendations for Other Services  OT consult    Functional Status Assessment Patient has had a recent decline in their functional status and demonstrates the ability to make significant improvements in function in a reasonable and predictable amount of time.     Precautions / Restrictions Precautions Precautions: Fall Precaution Comments: monitor BP Restrictions Weight Bearing Restrictions: No      Mobility  Bed Mobility Overal bed mobility: Modified Independent Bed Mobility: Sit to Supine, Supine to Sit     Supine to sit: Modified independent (Device/Increase time) Sit to supine: Modified independent (Device/Increase  time)   General bed mobility comments: increased time.    Transfers Overall transfer level: Needs assistance Equipment used: Rolling walker (2 wheels) Transfers: Sit to/from Stand Sit to Stand: Min guard           General transfer comment: min guard for safety.    Ambulation/Gait Ambulation/Gait assistance: Min guard Gait Distance (Feet): 75 Feet Assistive device: Rolling walker (2 wheels) Gait Pattern/deviations: Step-through pattern, Decreased stride length       General Gait Details: min guard for safety. slow gait speed. pt fatigues fairly easily  Science writer    Modified Rankin (Stroke Patients Only)       Balance Overall balance assessment: Needs assistance, History of Falls         Standing balance support: Bilateral upper extremity supported, During functional activity, Reliant on assistive device for balance Standing balance-Leahy Scale: Fair                               Pertinent Vitals/Pain Pain Assessment Pain Assessment:  (pt reported pain-made RN aware of pt request for pain meds)    Home Living Family/patient expects to be discharged to:: Private residence Living Arrangements: Alone   Type of Home: House           Home Equipment: Conservation officer, nature (2 wheels);Wheelchair - manual      Prior Function Prior Level of Function : Independent/Modified Independent             Mobility Comments: uses RW vs WC (mostly) ADLs Comments: patient reported being independent at home wth ADLs. patient reported her son is "more involved  now" and looking for some place for her to go. patient repoted someone to check in on her daily not like an ALF     Hand Dominance   Dominant Hand: Right    Extremity/Trunk Assessment   Upper Extremity Assessment Upper Extremity Assessment: Defer to OT evaluation    Lower Extremity Assessment Lower Extremity Assessment: Generalized weakness    Cervical / Trunk  Assessment Cervical / Trunk Assessment: Normal  Communication   Communication: No difficulties  Cognition Arousal/Alertness: Awake/alert Behavior During Therapy: WFL for tasks assessed/performed Overall Cognitive Status: Within Functional Limits for tasks assessed                                          General Comments      Exercises     Assessment/Plan    PT Assessment Patient needs continued PT services  PT Problem List Decreased strength;Decreased balance;Decreased mobility;Decreased activity tolerance;Decreased knowledge of use of DME;Decreased cognition       PT Treatment Interventions DME instruction;Gait training;Functional mobility training;Therapeutic activities;Balance training;Patient/family education;Therapeutic exercise    PT Goals (Current goals can be found in the Care Plan section)  Acute Rehab PT Goals Patient Stated Goal: to feel better PT Goal Formulation: With patient Time For Goal Achievement: 10/31/21 Potential to Achieve Goals: Good    Frequency Min 3X/week     Co-evaluation               AM-PAC PT "6 Clicks" Mobility  Outcome Measure Help needed turning from your back to your side while in a flat bed without using bedrails?: None Help needed moving from lying on your back to sitting on the side of a flat bed without using bedrails?: None Help needed moving to and from a bed to a chair (including a wheelchair)?: A Little Help needed standing up from a chair using your arms (e.g., wheelchair or bedside chair)?: A Little Help needed to walk in hospital room?: A Little Help needed climbing 3-5 steps with a railing? : A Lot 6 Click Score: 19    End of Session Equipment Utilized During Treatment: Gait belt Activity Tolerance: Patient tolerated treatment well;Patient limited by fatigue Patient left: in bed;with call bell/phone within reach;with bed alarm set   PT Visit Diagnosis: History of falling (Z91.81);Muscle weakness  (generalized) (M62.81);Unsteadiness on feet (R26.81)    Time: 8768-1157 PT Time Calculation (min) (ACUTE ONLY): 28 min   Charges:   PT Evaluation $PT Eval Low Complexity: 1 Low PT Treatments $Gait Training: 8-22 mins          Doreatha Massed, PT Acute Rehabilitation  Office: [phone] Pager: [phone]

## 2021-10-17 NOTE — Consult Note (Signed)
Referring Provider: [facility] Primary Care Physician:  Bartholome Bill, MD Primary Gastroenterologist:  Althia Forts [phone])  Reason for Consultation: Intractable nausea and vomiting  HPI: [name] is a 66 y.o. female . female with medical history significant of PTSD schizoaffective disorder depression IBS, chronic unsteady gait, hypertension, pancreatitis, cholecystectomy 1998, smoking.   Patient recently admitted 6/11 to 6/16 for abdominal pain nausea and vomiting.  Symptoms were thought to be IBS related.  Patient was started on dicyclomine and colestipol.  Patient states she felt improved after leaving the hospital in June.  She then began a recurrence of nausea vomiting and diarrhea that she feels was worse than it was prior to recent admission.  States she has also noted melena ongoing for the last couple weeks.  Difficult to obtain history as patient is lethargic, though patient states nausea and vomiting is not associated with eating and occurs at all times of the day.  Reports mild upper abdominal discomfort.  Denies hematemesis.  Denies NSAID use.  States her last formed stool was about 1 month ago.  Denies recent antibiotic use.  EGD 04/2017 (done for nausea/vomiting per patient) showed reflux esophagitis.  Negative pathology.  Repeat EGD recommended 04/2020  Colonoscopy 02/2016: Sigmoid diverticulosis and internal hemorrhoids.  Repeat colonoscopy due 02/2021 due to past history of colonic adenomas  Gastric emptying scan 05/29/2019 showed delayed gastric emptying (30% at 4 hours)  Past Medical History:  Diagnosis Date   DDD (degenerative disc disease), lumbar    Depression    Diverticulosis    Dizziness    GERD (gastroesophageal reflux disease)    IBS (irritable bowel syndrome)    Internal hemorrhoid    2nd degree   Myocarditis ([place]) 2009   Osteoarthritis    Prolapsed internal hemorrhoids, grade 3 12/01/2015   Recurrent falls    Schizoaffective disorder ([place])    Tubular  adenoma of colon 08/2011    Past Surgical History:  Procedure Laterality Date   BREAST BIOPSY Right 12/27/2012   CHOLECYSTECTOMY     HEMORRHOID BANDING     INTRAMEDULLARY (IM) NAIL INTERTROCHANTERIC Left 01/24/2020   Procedure: INTRAMEDULLARY (IM) NAIL INTERTROCHANTRIC;  Surgeon: Leandrew Koyanagi, MD;  Location: [facility];  Service: Orthopedics;  Laterality: Left;    Prior to Admission medications   Medication Sig Start Date End Date Taking? Authorizing Provider  benztropine (COGENTIN) 1 MG tablet Take 1 tablet (1 mg total) by mouth daily. 08/25/21  Yes [name], MD  cyclobenzaprine (FLEXERIL) 5 MG tablet Take 5 mg by mouth 3 (three) times daily as needed for muscle spasms.   Yes [provider]  gabapentin (NEURONTIN) 300 MG capsule Take 300 mg by mouth at bedtime. 10/02/21  Yes [provider]  lisinopril-hydrochlorothiazide (ZESTORETIC) 20-12.5 MG tablet Take 1 tablet by mouth daily. 10/03/21  Yes [provider]  magnesium gluconate (MAGONATE) 500 MG tablet Take 500 mg by mouth 2 (two) times daily.   Yes [provider]  meclizine (ANTIVERT) 25 MG tablet Take 25 mg by mouth daily as needed for dizziness.   Yes [provider]  omeprazole (PRILOSEC) 40 MG capsule Take 40 mg by mouth in the morning and at bedtime.   Yes [provider]  ondansetron (ZOFRAN-ODT) 4 MG disintegrating tablet Take 4 mg by mouth every 8 (eight) hours as needed for nausea or vomiting. 10/05/21  Yes [provider]  sertraline (ZOLOFT) 100 MG tablet TAKE 2 TABLETS(200 MG) BY MOUTH DAILY Patient taking differently: Take 100  mg by mouth 2 (two) times daily. TAKE 2 TABLETS(200 MG) BY MOUTH DAILY 08/25/21  Yes [name], MD  sucralfate (CARAFATE) 1 GM/10ML suspension Take 10 mLs (1 g total) by mouth 4 (four) times daily -  with meals and at bedtime. Patient taking differently: Take 1 g by mouth 4 (four) times daily. 01/20/20 10/16/21 Yes Donne Hazel,  MD  traZODone (DESYREL) 100 MG tablet TAKE 2 TABLETS(200 MG) BY MOUTH AT BEDTIME AS NEEDED FOR SLEEP Patient taking differently: Take 200 mg by mouth at bedtime. 08/25/21  Yes [name], MD  ziprasidone (GEODON) 80 MG capsule Take 1 capsule (80 mg total) by mouth in the morning and at bedtime. 08/25/21  Yes [name], MD  acidophilus (RISAQUAD) CAPS capsule Take 2 capsules by mouth daily. Patient not taking: Reported on 10/16/2021 09/24/21   Geradine Girt, DO  ciprofloxacin (CIPRO) 500 MG tablet Take 1 tablet (500 mg total) by mouth 2 (two) times daily. Patient not taking: Reported on 10/16/2021 09/23/21   Geradine Girt, DO  colestipol (COLESTID) 1 g tablet Take 1 tablet (1 g total) by mouth 2 (two) times daily. Patient not taking: Reported on 10/16/2021 09/23/21   Geradine Girt, DO  dicyclomine (BENTYL) 20 MG tablet Take 1 tablet (20 mg total) by mouth 3 (three) times daily before meals. Patient not taking: Reported on 10/16/2021 09/23/21   Geradine Girt, DO  lisinopril (ZESTRIL) 40 MG tablet Take 1 tablet (40 mg total) by mouth daily. Patient not taking: Reported on 10/16/2021 09/24/21   Geradine Girt, DO  loperamide (IMODIUM) 2 MG capsule Take 2 capsules (4 mg total) by mouth as needed for diarrhea or loose stools. Patient not taking: Reported on 10/16/2021 09/23/21   Geradine Girt, DO  oxycodone (OXY-IR) 5 MG capsule Take 5 mg by mouth every 12 (twelve) hours as needed for pain. Patient not taking: Reported on 10/16/2021    [provider]    Scheduled Meds:  acidophilus  2 capsule Oral Daily   benztropine  1 mg Oral Daily   colestipol  1 g Oral BID   dicyclomine  10 mg Oral TID AC & HS   gabapentin  300 mg Oral QHS   magnesium gluconate  500 mg Oral BID   nicotine  14 mg Transdermal Daily   pantoprazole  40 mg Oral Daily   [START ON 10/18/2021] pneumococcal 20-valent conjugate vaccine  0.5 mL Intramuscular Tomorrow-1000   sertraline  100 mg Oral BID   sucralfate  1 g  Oral QID   thiamine injection  100 mg Intravenous Q24H   traZODone  200 mg Oral QHS   Continuous Infusions: PRN Meds:.acetaminophen **OR** acetaminophen, cyclobenzaprine, HYDROcodone-acetaminophen, loperamide, mouth rinse  Allergies as of 10/16/2021 - Review Complete 10/16/2021  Allergen Reaction Noted   Penicillins Rash 12/09/2013   Sulfa antibiotics Rash 11/04/2013    Family History  Problem Relation Age of Onset   Suicidality Father    Depression Father    Suicidality Sister    Depression Sister    Suicidality Cousin    Suicidality Other     Social History   Socioeconomic History   Marital status: Divorced    Spouse name: Not on file   Number of children: 2   Years of education: Not on file   Highest education level: Not on file  Occupational History   Occupation: Disabled  Tobacco Use   Smoking status: Every Day    Packs/day: 0.50  Years: 38.00    Total pack years: 19.00    Types: Cigarettes   Smokeless tobacco: Never  Vaping Use   Vaping Use: Never used  Substance and Sexual Activity   Alcohol use: No    Alcohol/week: 0.0 standard drinks of alcohol   Drug use: No   Sexual activity: Not Currently  Other Topics Concern   Not on file  Social History Narrative   Not on file   Social Determinants of Health   Financial Resource Strain: Not on file  Food Insecurity: Not on file  Transportation Needs: Not on file  Physical Activity: Not on file  Stress: Not on file  Social Connections: Not on file  Intimate Partner Violence: Not on file    Review of Systems: Review of Systems  Constitutional:  Negative for chills, fever and weight loss.  HENT:  Negative for hearing loss and tinnitus.   Eyes:  Negative for blurred vision and double vision.  Respiratory:  Negative for cough and hemoptysis.   Cardiovascular:  Negative for chest pain and palpitations.  Gastrointestinal:  Positive for abdominal pain, diarrhea, nausea and vomiting. Negative for blood in  stool, constipation, heartburn and melena.  Genitourinary:  Negative for dysuria and urgency.  Musculoskeletal:  Negative for myalgias and neck pain.  Skin:  Negative for itching and rash.  Neurological:  Negative for seizures and loss of consciousness.  Psychiatric/Behavioral:  Negative for depression and suicidal ideas.      Physical Exam:Physical Exam Constitutional:      Appearance: She is normal weight.     Comments: Lethargic, difficulty staying awake and engaging in conversation  HENT:     Head: Normocephalic and atraumatic.     Nose: Nose normal. No congestion.     Mouth/Throat:     Mouth: Mucous membranes are moist.     Pharynx: Oropharynx is clear.  Eyes:     Extraocular Movements: Extraocular movements intact.     Conjunctiva/sclera: Conjunctivae normal.  Cardiovascular:     Rate and Rhythm: Normal rate and regular rhythm.  Pulmonary:     Effort: Pulmonary effort is normal. No respiratory distress.  Abdominal:     General: Abdomen is flat. Bowel sounds are normal. There is no distension.     Palpations: Abdomen is soft. There is no mass.     Tenderness: There is no abdominal tenderness. There is no guarding or rebound.     Hernia: No hernia is present.  Musculoskeletal:        General: No swelling. Normal range of motion.     Cervical back: Normal range of motion and neck supple.  Skin:    General: Skin is warm and dry.  Neurological:     General: No focal deficit present.     Mental Status: She is oriented to person, place, and time.  Psychiatric:        Mood and Affect: Mood normal.        Thought Content: Thought content normal.        Judgment: Judgment normal.     Vital signs: Vitals:   10/17/21 0126 10/17/21 0509  BP: (!) 122/52 (!) 104/51  Pulse: 68 72  Resp: 20 19  Temp: 97.9 F (36.6 C) 98.4 F (36.9 C)  SpO2: 100% 95%   Last BM Date : 10/17/21    GI:  Lab Results: Recent Labs    10/16/21 1042 10/17/21 0535  WBC 13.6* 10.7*  HGB  12.8 10.4*  HCT 35.6* 30.1*  PLT 372 246   BMET Recent Labs    10/16/21 1042 10/16/21 2253 10/17/21 0535  NA 122* 125* 129*  K 3.1* 3.0* 3.5  CL 85* 94* 99  CO2 20* 19* 19*  GLUCOSE 128* 100* 90  BUN _0 CREATININE 1.40* 1.51* 1.16*  CALCIUM 9.3 8.5* 8.8*   LFT Recent Labs    10/16/21 2319 10/17/21 0535  PROT 6.6 5.9*  ALBUMIN 3.6 3.3*  AST 50* 42*  ALT 25 22  ALKPHOS 131* 125  BILITOT 1.3* 1.0  BILIDIR 0.1  --   IBILI 1.2*  --    PT/INR No results for input(s): "LABPROT", "INR" in the last 72 hours.   Studies/Results: DG CHEST PORT 1 VIEW  Result Date: 10/16/2021 CLINICAL DATA:  Hyponatremia.  Upper abdominal pain.  Nausea. EXAM: PORTABLE CHEST 1 VIEW COMPARISON:  09/19/2021. FINDINGS: The heart is normal in size.The cardiomediastinal contours are normal. Mild biapical pleuroparenchymal scarring. Pulmonary vasculature is normal. No consolidation, pleural effusion, or pneumothorax. No acute osseous abnormalities are seen. IMPRESSION: No acute chest findings. Electronically Signed   By: Keith Rake M.D.   On: 10/16/2021 20:46   CT ABDOMEN PELVIS W CONTRAST  Result Date: 10/16/2021 CLINICAL DATA:  Abdominal pain. EXAM: CT ABDOMEN AND PELVIS WITH CONTRAST TECHNIQUE: Multidetector CT imaging of the abdomen and pelvis was performed using the standard protocol following bolus administration of intravenous contrast. RADIATION DOSE REDUCTION: This exam was performed according to the departmental dose-optimization program which includes automated exposure control, adjustment of the mA and/or kV according to patient size and/or use of iterative reconstruction technique. CONTRAST:  85m OMNIPAQUE IOHEXOL 300 MG/ML  SOLN COMPARISON:  CT abdomen pelvis dated September 18, 2021. FINDINGS: Lower chest: No acute abnormality. 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 Hepatobiliary: No focal liver abnormality is seen. Status post cholecystectomy. No biliary dilatation.  Pancreas: Unremarkable. No pancreatic ductal dilatation or surrounding inflammatory changes. Spleen: Normal in size without focal abnormality. Adrenals/Urinary Tract: Adrenal glands are unremarkable. Unchanged 6.5 cm simple cyst in the left kidney. No follow-up imaging is recommended. No renal calculi or hydronephrosis. The bladder is decompressed. Stomach/Bowel: Stomach is within normal limits. Appendix appears normal. No evidence of bowel wall thickening, distention, or inflammatory changes. Mild sigmoid colonic diverticulosis. Vascular/Lymphatic: Aortic atherosclerosis. No enlarged abdominal or pelvic lymph nodes. Reproductive: Uterus and bilateral adnexa are unremarkable. Other: No abdominal wall hernia or abnormality. No abdominopelvic ascites. No pneumoperitoneum. Musculoskeletal: Unchanged subacute to chronic bilateral sacral ala insufficiency fractures. IMPRESSION: 1. No acute intra-abdominal process. 2. Unchanged subacute to chronic bilateral sacral ala insufficiency fractures. 3. Aortic Atherosclerosis (ICD10-I70.0). Electronically Signed   By: WTitus DubinM.D.   On: 10/16/2021 17:37    Impression: Intractable nausea/vomiting, melena; esophagitis versus gastroparesis versus other -Creatinine 1.16, BUN 16 -AST 42/ALT 22/alk phos 125 -Leukocytosis with WBC 10.7 -Hgb 10.4, MCV 84.8 -Phosphorus and magnesium normal -CT abdomen pelvis with contrast 7/9: No acute intra-abdominal process, chronic bilateral sacral fractures.  Mild sigmoid diverticulosis -GI pathogen panel pending -Fecal occult ordered though not collected  Hyponatremia -Sodium 129   Plan: Vague history including nausea, vomiting, questionable melena.  Hemoglobin stable and BUN normal.  Intractable nausea/vomiting could be esophagitis versus gastroparesis. Recommend PPI twice daily We will await GI pathogen panel Could consider evaluation with EGD, last EGD in 2019 showed esophagitis. Suspect that pain medication could be  making symptoms worse Continue supportive care and antiemetics as needed [facility] will follow   LOS: 1 day  Laquashia Mergenthaler Radford Pax  PA-C 10/17/2021, 8:40 AM  Contact #  [phone]

## 2021-10-17 NOTE — Evaluation (Signed)
Occupational Therapy Evaluation Patient Details Name: [name] MRN: [id number] DOB: 1955/07/02 Today's Date: 10/17/2021   History of Present Illness 66 yo female admitted with hyponatremia, weakness. Pt reports falls at home prior to admission. Hx of L hip IM nail, PTSD, schizophrenia, recurrent N/V/D, pancreatitis (Simultaneous filing. User may not have seen previous data.)   Clinical Impression   Patient is a 66 year old female who was admitted for above. Patient was noted to be near baseline for ADL tasks on this date. Patient needed min A for sit to stand but min guard for use of RW. Patient reported living home alone with son attempting to get more caregiver support. Patient would benefit from increased caregiver support in next level of care. Patient was noted to have decreased functional activity tolerance, decreased safety awareness, and decreased knowledge of AD/AE impacting participation in ADLs. Patient would continue to benefit from skilled OT services at this time while admitted and after d/c to address noted deficits in order to improve overall safety and independence in ADLs.         Recommendations for follow up therapy are one component of a multi-disciplinary discharge planning process, led by the attending physician.  Recommendations may be updated based on patient status, additional functional criteria and insurance authorization.   Follow Up Recommendations  Home health OT    Assistance Recommended at Discharge Frequent or constant Supervision/Assistance  Patient can return home with the following A little help with walking and/or transfers;A little help with bathing/dressing/bathroom;Assistance with cooking/housework;Direct supervision/assist for financial management;Assist for transportation;Help with stairs or ramp for entrance;Direct supervision/assist for medications management    Functional Status Assessment  Patient has had a recent decline in their  functional status and demonstrates the ability to make significant improvements in function in a reasonable and predictable amount of time.  Equipment Recommendations  None recommended by OT    Recommendations for Other Services       Precautions / Restrictions Precautions Precautions: Fall Precaution Comments: monitor BP Restrictions Weight Bearing Restrictions: No      Mobility Bed Mobility Overal bed mobility: Needs Assistance Bed Mobility: Supine to Sit, Sit to Supine     Supine to sit: Supervision, HOB elevated Sit to supine: Supervision   General bed mobility comments: with safety for lines.    Transfers                          Balance Overall balance assessment: Mild deficits observed, not formally tested                                         ADL either performed or assessed with clinical judgement   ADL Overall ADL's : Needs assistance/impaired Eating/Feeding: Set up;Sitting Eating/Feeding Details (indicate cue type and reason): taking small sips from glass of water in room. Grooming: Min guard;Wash/dry hands;Standing Grooming Details (indicate cue type and reason): at sink with cues for proper placement of RW Upper Body Bathing: Set up;Sitting   Lower Body Bathing: Minimal assistance;Sit to/from stand;Sitting/lateral leans   Upper Body Dressing : Set up;Sitting   Lower Body Dressing: Sit to/from stand;Minimal assistance;Sitting/lateral leans   Toilet Transfer: Minimal assistance;Rolling walker (2 wheels) Toilet Transfer Details (indicate cue type and reason): with increased time with no LOB. patient needed physical assist for sit to stand min guard for rest of transfer.  with cues for proper hand placement Toileting- Clothing Manipulation and Hygiene: Min guard;Sitting/lateral lean Toileting - Clothing Manipulation Details (indicate cue type and reason): on commode             Vision Patient Visual Report: No change  from baseline       Perception     Praxis      Pertinent Vitals/Pain Pain Assessment Pain Assessment: Faces Faces Pain Scale: Hurts a little bit Pain Location: abdomen Pain Descriptors / Indicators: Discomfort, Grimacing Pain Intervention(s): Monitored during session, Repositioned     Hand Dominance Right   Extremity/Trunk Assessment Upper Extremity Assessment Upper Extremity Assessment: Defer to OT evaluation   Lower Extremity Assessment Lower Extremity Assessment: Generalized weakness   Cervical / Trunk Assessment Cervical / Trunk Assessment: Normal   Communication Communication Communication: No difficulties   Cognition Arousal/Alertness: Awake/alert Behavior During Therapy: WFL for tasks assessed/performed Overall Cognitive Status: Within Functional Limits for tasks assessed                                       General Comments       Exercises     Shoulder Instructions      Home Living Family/patient expects to be discharged to:: Private residence Living Arrangements: Alone   Type of Home: House                       Home Equipment: Conservation officer, nature (2 wheels);Wheelchair - manual          Prior Functioning/Environment Prior Level of Function : Independent/Modified Independent             Mobility Comments: uses RW vs WC (mostly) ADLs Comments: patient reported being independent at home wth ADLs. patient reported her son is "more involved now" and looking for some place for her to go. patient repoted someone to check in on her daily not like an ALF        OT Problem List: Impaired balance (sitting and/or standing);Decreased safety awareness;Decreased knowledge of precautions;Decreased knowledge of use of DME or AE      OT Treatment/Interventions: Self-care/ADL training;Therapeutic exercise;Neuromuscular education;Energy conservation;DME and/or AE instruction;Therapeutic activities;Balance training;Patient/family  education    OT Goals(Current goals can be found in the care plan section) Acute Rehab OT Goals Patient Stated Goal: to get back home with support son plans to set up OT Goal Formulation: Patient unable to participate in goal setting Time For Goal Achievement: 10/31/21 Potential to Achieve Goals: Fair  OT Frequency: Min 2X/week    Co-evaluation              AM-PAC OT "6 Clicks" Daily Activity     Outcome Measure Help from another person eating meals?: A Little Help from another person taking care of personal grooming?: A Little Help from another person toileting, which includes using toliet, bedpan, or urinal?: A Little Help from another person bathing (including washing, rinsing, drying)?: A Lot Help from another person to put on and taking off regular upper body clothing?: A Little Help from another person to put on and taking off regular lower body clothing?: A Lot 6 Click Score: 16   End of Session Equipment Utilized During Treatment: Rolling walker (2 wheels) Nurse Communication: Other (comment) (nurse present in room during session)  Activity Tolerance: Patient tolerated treatment well Patient left: in bed;with call bell/phone within reach;with nursing/sitter in room (  labs in room to get blood)  OT Visit Diagnosis: Unsteadiness on feet (R26.81);Muscle weakness (generalized) (M62.81)                Time: 2751-7001 OT Time Calculation (min): 19 min Charges:  OT General Charges $OT Visit: 1 Visit OT Evaluation $OT Eval Low Complexity: 1 Low  Jackelyn Poling OTR/L, MS Acute Rehabilitation Department Office# [phone] Pager# [phone]   Marcellina Millin 10/17/2021, 4:06 PM

## 2021-10-17 NOTE — Progress Notes (Signed)
Initial Nutrition Assessment  INTERVENTION:   -Boost Breeze po TID, each supplement provides 250 kcal and 9 grams of protein  -Multivitamin with minerals daily  NUTRITION DIAGNOSIS:   Increased nutrient needs related to chronic illness as evidenced by estimated needs.  GOAL:   Patient will meet greater than or equal to 90% of their needs  MONITOR:   PO intake, Supplement acceptance, Labs, Weight trends, I & O's  REASON FOR ASSESSMENT:   Consult Assessment of nutrition requirement/status  ASSESSMENT:   66 y.o. female . female with medical history significant of PTSD schizoaffective disorder depression IBS, chronic unsteady gait, hypertension, pancreatitis, cholecystectomy 1998, smoking.  Patient receiving patient care at time of visit. Will attempt to gather history at a later time. Per chart review, pt has been having N/V and diarrhea for a month now. Was recently on antibiotics. Has a history of alcohol abuse but not currently drinking.  Will order Boost Breeze supplements and daily MVI.  Per weight records, pt has lost 14 lbs since 6/12 (8% wt loss x 1 month, significant for time frame).  Medications: Bentyl, Magonate, Carafate, Thiamine, KCl  Labs reviewed: Low Na  NUTRITION - FOCUSED PHYSICAL EXAM:  Unable to complete at this time  Diet Order:   Diet Order             Diet Heart Room service appropriate? Yes; Fluid consistency: Thin  Diet effective now                   EDUCATION NEEDS:   Not appropriate for education at this time  Skin:  Skin Assessment: Reviewed RN Assessment  Last BM:  7/10  Height:   Ht Readings from Last 1 Encounters:  10/17/21 '5\' 2"'$  (1.575 m)    Weight:   Wt Readings from Last 1 Encounters:  10/17/21 65 kg    BMI:  Body mass index is 26.19 kg/m.  Estimated Nutritional Needs:   Kcal:  1900-2100  Protein:  80-90g  Fluid:  2L/day  Clayton Bibles, MS, RD, LDN Inpatient Clinical Dietitian Contact  information available via Amion

## 2021-10-18 DIAGNOSIS — E871 Hypo-osmolality and hyponatremia: Secondary | ICD-10-CM | POA: Diagnosis not present

## 2021-10-18 LAB — MAGNESIUM: Magnesium: 1.9 mg/dL (ref 1.7–2.4)

## 2021-10-18 LAB — COMPREHENSIVE METABOLIC PANEL
ALT: 21 U/L (ref 0–44)
AST: 29 U/L (ref 15–41)
Albumin: 3.5 g/dL (ref 3.5–5.0)
Alkaline Phosphatase: 122 U/L (ref 38–126)
Anion gap: 6 (ref 5–15)
BUN: 7 mg/dL — ABNORMAL LOW (ref 8–23)
CO2: 24 mmol/L (ref 22–32)
Calcium: 9 mg/dL (ref 8.9–10.3)
Chloride: 105 mmol/L (ref 98–111)
Creatinine, Ser: 0.62 mg/dL (ref 0.44–1.00)
GFR, Estimated: >60 mL/min (ref >60)
Glucose, Bld: 119 mg/dL — ABNORMAL HIGH (ref 70–99)
Potassium: 3 mmol/L — ABNORMAL LOW (ref 3.5–5.1)
Sodium: 135 mmol/L (ref 135–145)
Total Bilirubin: 0.5 mg/dL (ref 0.3–1.2)
Total Protein: 6.3 g/dL — ABNORMAL LOW (ref 6.5–8.1)

## 2021-10-18 LAB — CBC
HCT: 33.6 % — ABNORMAL LOW (ref 36.0–46.0)
Hemoglobin: 11.3 g/dL — ABNORMAL LOW (ref 12.0–15.0)
MCH: 29.6 pg (ref 26.0–34.0)
MCHC: 33.6 g/dL (ref 30.0–36.0)
MCV: 88 fL (ref 80.0–100.0)
Platelets: 248 10*3/uL (ref 150–400)
RBC: 3.82 MIL/uL — ABNORMAL LOW (ref 3.87–5.11)
RDW: 16.1 % — ABNORMAL HIGH (ref 11.5–15.5)
WBC: 9.4 10*3/uL (ref 4.0–10.5)
nRBC: 0 % (ref 0.0–0.2)

## 2021-10-18 MED ORDER — ONDANSETRON HCL 4 MG/2ML IJ SOLN
4.0000 mg | Freq: Four times a day (QID) | INTRAMUSCULAR | Status: DC | PRN
  Administered 2021-10-18 – 2021-10-21 (×7): 4 mg via INTRAVENOUS
  Filled 2021-10-18 (×7): qty 2

## 2021-10-18 MED ORDER — HYDROCODONE-ACETAMINOPHEN 7.5-325 MG/15ML PO SOLN
10.0000 mL | ORAL | Status: DC | PRN
  Administered 2021-10-18 – 2021-10-19 (×3): 15 mL via ORAL
  Administered 2021-10-20: 20 mL via ORAL
  Filled 2021-10-18: qty 30
  Filled 2021-10-18: qty 15
  Filled 2021-10-18: qty 30
  Filled 2021-10-18: qty 15

## 2021-10-18 MED ORDER — POTASSIUM CHLORIDE 20 MEQ PO PACK
40.0000 meq | PACK | Freq: Two times a day (BID) | ORAL | Status: DC
Start: 2021-10-18 — End: 2021-10-18
  Administered 2021-10-18: 40 meq via ORAL
  Filled 2021-10-18 (×2): qty 2

## 2021-10-18 MED ORDER — PANTOPRAZOLE SODIUM 40 MG PO TBEC
40.0000 mg | DELAYED_RELEASE_TABLET | Freq: Two times a day (BID) | ORAL | Status: DC
  Administered 2021-10-18 – 2021-10-22 (×8): 40 mg via ORAL
  Filled 2021-10-18 (×8): qty 1

## 2021-10-18 MED ORDER — PROCHLORPERAZINE EDISYLATE 10 MG/2ML IJ SOLN
10.0000 mg | INTRAMUSCULAR | Status: DC | PRN
  Administered 2021-10-18 (×3): 10 mg via INTRAVENOUS
  Filled 2021-10-18 (×3): qty 2

## 2021-10-18 MED ORDER — SODIUM CHLORIDE 0.9 % IV SOLN: INTRAVENOUS | Status: DC

## 2021-10-18 MED ORDER — HYDROMORPHONE HCL 1 MG/ML IJ SOLN
1.0000 mg | Freq: Once | INTRAMUSCULAR | Status: AC
  Administered 2021-10-18: 1 mg via INTRAVENOUS
  Filled 2021-10-18: qty 1

## 2021-10-18 MED ORDER — POTASSIUM CHLORIDE 10 MEQ/100ML IV SOLN
10.0000 meq | INTRAVENOUS | Status: AC
  Administered 2021-10-18 – 2021-10-19 (×4): 10 meq via INTRAVENOUS
  Filled 2021-10-18 (×4): qty 100

## 2021-10-18 NOTE — Progress Notes (Signed)
[facility] G[facility]enterology Progress Note  [name] 66 y.o. [birth year]  CC: Intractable nausea and vomiting, diarrhea   Subjective: Patient states she was able to tolerate solid food yesterday.  States due to increased nausea she does not want to eat today.  Had 1 episode of vomiting this morning, nonbloody.  Continues to feel nauseous.  Patient is tearful due to sitting in an uncomfortable chair and making her nausea worse.  Has not had any further bowel movements since yesterday  ROS : Review of Systems  Constitutional:  Negative for chills, fever and weight loss.  Gastrointestinal:  Positive for diarrhea, nausea and vomiting. Negative for abdominal pain, blood in stool, constipation, heartburn and melena.      Objective: Vital signs in last 24 hours: Vitals:   10/17/21 2259 10/18/21 0528  BP: (!) 106/55 (!) 148/77  Pulse: 61 67  Resp: 17 17  Temp: 98.1 F (36.7 C) (!) 97.5 F (36.4 C)  SpO2: 97% 96%    Physical Exam:  General:  Alert, cooperative, no distress, tearful  Head:  Normocephalic, without obvious abnormality, atraumatic  Eyes:  Anicteric sclera, EOM's intact  Lungs:   Clear to auscultation bilaterally, respirations unlabored  Heart:  Regular rate and rhythm, S1, S2 normal  Abdomen:   Soft, non-tender, bowel sounds active all four quadrants,  no masses,     Lab Results: Recent Labs    10/16/21 2319 10/17/21 0535 10/17/21 1404 10/17/21 2119 10/18/21 0536  NA  --  129*   < > 131* 135  K  --  3.5   < > 3.1* 3.0*  CL  --  99   < > 104 105  CO2  --  19*   < > 20* 24  GLUCOSE  --  90   < > 107* 119*  BUN  --  16   < > 9 7*  CREATININE  --  1.16*   < > 0.82 0.62  CALCIUM  --  8.8*   < > 8.7* 9.0  MG 1.8 1.8  --   --  1.9  PHOS 3.5 3.7  --   --   --    < > = values in this interval not displayed.   Recent Labs    10/17/21 0535 10/18/21 0536  AST 42* 29  ALT 22 21  ALKPHOS 125 122  BILITOT 1.0 0.5  PROT 5.9* 6.3*  ALBUMIN 3.3* 3.5   Recent  Labs    10/16/21 1042 10/17/21 0535 10/18/21 0536  WBC 13.6* 10.7* 9.4  NEUTROABS 11.6*  --   --   HGB 12.8 10.4* 11.3*  HCT 35.6* 30.1* 33.6*  MCV 81.8 84.8 88.0  PLT 372 246 248   No results for input(s): "LABPROT", "INR" in the last 72 hours.    Assessment Intractable nausea/vomiting, melena; esophagitis versus gastroparesis versus other -BUN 7, creatinine 0.62 -Normal LFTs -No leukocytosis -Hgb 11.3 -Phosphorus and magnesium normal -CT abdomen pelvis with contrast 7/9: No acute intra-abdominal process, chronic bilateral sacral fractures.  Mild sigmoid diverticulosis -GI pathogen panel positive for enterotoxigenic E. coli -Fecal occult ordered though not collected   Plan: Continued nausea.  Suspect nausea and vomiting are multifactorial.  Esophagitis/gastritis versus gastroparesis versus side effect from multiple medications. patient could benefit from repeat gastric emptying study for further evaluation Has not had bowel movement since yesterday, though GI pathogen panel tested positive for enterotoxigenic E. coli.  Recommend supportive care and hydration.  If patient begins to have recurrence  of multiple episodes of loose stools, can consider antibiotic therapy with azithromycin for E. coli.  But at this time, will proceed with supportive care Continue supportive care and antiemetics as needed [facility] will follow  Garnette Scheuermann PA-C 10/18/2021, 11:33 AM  Contact #  [phone]

## 2021-10-18 NOTE — TOC Initial Note (Addendum)
Transition of Care [facility]) - Initial/Assessment Note    Patient Details  Name: [name] MRN: [id number] Date of Birth: 1955/07/29  Transition of Care [facility]) CM/SW Contact:    Vassie Moselle, LCSW Phone Number: 10/18/2021, 11:55 AM  Clinical Narrative:                 Met with pt and confirmed plans to return home with [facility] services. Pt was tearful during conversation and appeared to be in pain. Pt currently has DME at home and no further DME needs have been identified. CSW currently seeking [facility] agency to provide HHPT/OT for pt.   Update 1210: Pt has been set up with HHPT/OT through [place].  Expected Discharge Plan: [place] Barriers to Discharge: Continued Medical Work up   Patient Goals and CMS Choice Patient states their goals for this hospitalization and ongoing recovery are:: To return home   Choice offered to / list presented to : Patient  Expected Discharge Plan and Services Expected Discharge Plan: [place] In-house Referral: Clinical Social Work Discharge Planning Services: CM Consult Post Acute Care Choice: [place] arrangements for the past 2 months: Single Family Home                 DME Arranged: N/A DME Agency: NA                  Prior Living Arrangements/Services Living arrangements for the past 2 months: Single Family Home Lives with:: Self Patient language and need for interpreter reviewed:: Yes Do you feel safe going back to the place where you live?: Yes      Need for Family Participation in Patient Care: No (Comment) Care giver support system in place?: No (comment) Current home services: DME Criminal Activity/Legal Involvement Pertinent to Current Situation/Hospitalization: No - Comment as needed  Activities of Daily Living Home Assistive Devices/Equipment: Eyeglasses, Wheelchair, Environmental consultant (specify type) ADL Screening (condition at time of admission) Patient's cognitive ability adequate to  safely complete daily activities?: Yes Is the patient deaf or have difficulty hearing?: No Does the patient have difficulty seeing, even when wearing glasses/contacts?: No Does the patient have difficulty concentrating, remembering, or making decisions?: No Patient able to express need for assistance with ADLs?: Yes Does the patient have difficulty dressing or bathing?: No Independently performs ADLs?: No Communication: Independent Dressing (OT): Needs assistance Is this a change from baseline?: Change from baseline, expected to last <3days Grooming: Needs assistance Is this a change from baseline?: Change from baseline, expected to last <3 days Feeding: Independent Bathing: Needs assistance Is this a change from baseline?: Change from baseline, expected to last <3 days Toileting: Needs assistance Is this a change from baseline?: Change from baseline, expected to last <3 days In/Out Bed: Needs assistance Is this a change from baseline?: Change from baseline, expected to last <3 days Walks in Home: Independent with device (comment) (walker and if too weak uses a wheelchair) Does the patient have difficulty walking or climbing stairs?: Yes Weakness of Legs: Both Weakness of Arms/Hands: None  Permission Sought/Granted   Permission granted to share information with : No              Emotional Assessment Appearance:: Appears stated age Attitude/Demeanor/Rapport: Crying Affect (typically observed): Accepting, Tearful/Crying Orientation: : Oriented to Self, Oriented to Place, Oriented to  Time Alcohol / Substance Use: Not Applicable Psych Involvement: No (comment)  Admission diagnosis:  Dehydration [E86.0] Hypokalemia [E87.[place]  Hyponatremia [E87.1] AKI (acute kidney injury) ([place]) [N17.9] Patient Active Problem List   Diagnosis Date Noted   Elevated CK 10/17/2021   Diarrhea 10/16/2021   AKI (acute kidney injury) ([place]) 10/16/2021   Tobacco abuse 10/16/2021   Dehydration  09/19/2021   QT prolongation 09/19/2021   UTI (urinary tract infection) 09/19/2021   Hyponatremia 09/18/2021   Schizoaffective disorder, bipolar type ([place])    Closed intertrochanteric fracture of left femur, initial encounter ([place]) 01/24/2020   HTN (hypertension) 01/12/2020   Hypophosphatemia 01/12/2020   Hypokalemia 01/12/2020   Acute pancreatitis 01/08/2020   Depression 01/08/2020   Diverticulosis 01/08/2020   Tubular adenoma of colon 01/08/2020   Sepsis, unspecified organism ([place]) 01/08/2020   Prolapsed internal hemorrhoids, grade 3 12/01/2015   Schizoaffective disorder, depressive type ([place]) 11/04/2013   PTSD (post-traumatic stress disorder) 11/04/2013   Cigarette nicotine dependence without complication 95/97/4718   PCP:  Bartholome Bill, MD Pharmacy:   [facility] [place], [address] [place] [address] [place] [address] Phone: [phone] Fax: [phone]     Social Determinants of Health (SDOH) Interventions    Readmission Risk Interventions    10/18/2021   11:52 AM  Readmission Risk Prevention Plan  Transportation Screening Complete  PCP or Specialist Appt within 5-7 Days Complete  Home Care Screening Complete  Medication Review (RN CM) Complete

## 2021-10-18 NOTE — Progress Notes (Signed)
Per Cyd Silence, enteric precautions can be discontinued.

## 2021-10-18 NOTE — Progress Notes (Signed)
PROGRESS NOTE  [name]:[id number] DOB: [birth year] DOA: 10/16/2021 PCP: Bartholome Bill, MD   LOS: 2 days   Brief Narrative / Interim history: 66 year old lady with history of PTSD, schizoaffective disorder, depression, IBS, chronic pancreatitis, gastroparesis, tobacco use comes into the hospital with complaints of nausea, vomiting, diarrhea and abdominal cramping for the past month.  She apparently was admitted to the hospital for the same symptoms 3 weeks ago, was seen by GI and felt to be IBS-D.  Improved with supportive treatment and discharged home, however her symptoms recurred.  She now reports that she has been having little to no p.o. intake, ongoing nausea and vomiting, and diarrhea with dark stools.  Subjective / 24h Interval events: Very uncomfortable this morning, holding emesis bag and is retching.  Complains of abdominal pain  Assesement and Plan: Principal Problem:   Hyponatremia Active Problems:   Dehydration   HTN (hypertension)   Hypokalemia   Schizoaffective disorder, bipolar type (HCC)   QT prolongation   Diarrhea   AKI (acute kidney injury) ([place])   Tobacco abuse   Elevated CK   Principal problem Intractable nausea and vomiting-GI consulted, appreciate input.  Continue colestipol, dicyclomine.  May be multifactorial in the setting of known gastroparesis as well as IBS-D.  Cortisol level was checked and it was appropriately elevated at 22.  Appreciate GI follow-up, she seems to have persistent symptoms this morning  Active problems Hyponatremia-this is in the setting of dehydration, reported poor p.o. intake over the last several weeks.  Sodium has now normalized with fluids, 135 this morning  Hypokalemia-continue to replete potassium, 3.0 this morning.  Recheck tomorrow morning.  Magnesium 1.9  Acute kidney injury-creatinine 1.5 on admission, creatinine normalized with fluids.   QT prolongation-monitor, avoid QT prolonging medications.  QT  504  Schizoaffective disorder, bipolar-Geodon on hold due to QT prolongation, continue Zoloft  Leukocytosis-likely reactive, WBC normalized today  Mild normocytic anemia-possibly dilutional due to IV fluids  Scheduled Meds:  acidophilus  2 capsule Oral Daily   benztropine  1 mg Oral Daily   colestipol  1 g Oral BID   dicyclomine  10 mg Oral TID AC & HS   feeding supplement  1 Container Oral TID BM   gabapentin  300 mg Oral QHS   magnesium gluconate  500 mg Oral BID   multivitamin with minerals  1 tablet Oral Daily   nicotine  14 mg Transdermal Daily   pantoprazole  40 mg Oral Daily   pneumococcal 20-valent conjugate vaccine  0.5 mL Intramuscular Tomorrow-1000   potassium chloride  40 mEq Oral BID   sertraline  100 mg Oral BID   sucralfate  1 g Oral QID   thiamine injection  100 mg Intravenous Q24H   traZODone  200 mg Oral QHS   Continuous Infusions: PRN Meds:.acetaminophen **OR** acetaminophen, cyclobenzaprine, HYDROcodone-acetaminophen, loperamide, ondansetron (ZOFRAN) IV, mouth rinse, prochlorperazine  Diet Orders (From admission, onward)     Start     Ordered   10/17/21 0042  Diet Heart Room service appropriate? Yes; Fluid consistency: Thin  Diet effective now       Question Answer Comment  Room service appropriate? Yes   Fluid consistency: Thin      10/17/21 0041            DVT prophylaxis: SCDs Start: 10/16/21 2036   Lab Results  Component Value Date   PLT 248 10/18/2021      Code Status: Full Code  Family Communication: no  family at bedside  Status is: Inpatient Remains inpatient appropriate because: persistent symptoms  Level of care: Telemetry  Consultants:  GI  Objective: Vitals:   10/17/21 2259 10/18/21 0528 10/18/21 1204 10/18/21 1205  BP: (!) 106/55 (!) 148/77 (!) 164/71   Pulse: 61 67 62   Resp: '17 17 20   '$ Temp: 98.1 F (36.7 C) (!) 97.5 F (36.4 C)  98.2 F (36.8 C)  TempSrc: Oral Oral  Oral  SpO2: 97% 96% 100%   Weight:       Height:        Intake/Output Summary (Last 24 hours) at 10/18/2021 1250 Last data filed at 10/18/2021 0900 Gross per 24 hour  Intake 1639.76 ml  Output 1101 ml  Net 538.76 ml    Wt Readings from Last 3 Encounters:  10/17/21 65 kg  09/19/21 71.7 kg  08/29/21 72 kg    Examination:  Constitutional: NAD Eyes: lids and conjunctivae normal, no scleral icterus ENMT: mmm Neck: normal, supple Respiratory: clear to auscultation bilaterally, no wheezing, no crackles. Normal respiratory effort.  Cardiovascular: Regular rate and rhythm, no murmurs / rubs / gallops. No LE edema. Abdomen: Bowel sounds positive.  Skin: no rashes Neurologic: no focal deficits, equal strength  Data Reviewed: I have independently reviewed following labs and imaging studies   CBC Recent Labs  Lab 10/16/21 1042 10/17/21 0535 10/18/21 0536  WBC 13.6* 10.7* 9.4  HGB 12.8 10.4* 11.3*  HCT 35.6* 30.1* 33.6*  PLT 372 246 248  MCV 81.8 84.8 88.0  MCH 29.4 29.3 29.6  MCHC 36.0 34.6 33.6  RDW 15.4 15.6* 16.1*  LYMPHSABS 0.9  --   --   MONOABS 0.7  --   --   EOSABS 0.1  --   --   BASOSABS 0.1  --   --      Recent Labs  Lab 10/16/21 1042 10/16/21 2230 10/16/21 2253 10/16/21 2319 10/17/21 0535 10/17/21 1404 10/17/21 2119 10/18/21 0536  NA 122*  --  125*  --  129* 131* 131* 135  K 3.1*  --  3.0*  --  3.5 3.5 3.1* 3.0*  CL 85*  --  94*  --  99 102 104 105  CO2 20*  --  19*  --  19* 20* 20* 24  GLUCOSE 128*  --  100*  --  90 109* 107* 119*  BUN 17  --  19  --  '16 12 9 '$ 7*  CREATININE 1.40*  --  1.51*  --  1.16* 0.98 0.82 0.62  CALCIUM 9.3  --  8.5*  --  8.8* 8.8* 8.7* 9.0  AST 65*  --   --  50* 42*  --   --  29  ALT 28  --   --  25 22  --   --  21  ALKPHOS 153*  --   --  131* 125  --   --  122  BILITOT 1.7*  --   --  1.3* 1.0  --   --  0.5  ALBUMIN 4.3  --   --  3.6 3.3*  --   --  3.5  MG  --   --   --  1.8 1.8  --   --  1.9  LATICACIDVEN  --  0.5  --   --   --   --   --   --       ------------------------------------------------------------------------------------------------------------------ No results for input(s): "CHOL", "HDL", "LDLCALC", "TRIG", "CHOLHDL", "LDLDIRECT" in  the last 72 hours.  Lab Results  Component Value Date   HGBA1C 5.6 08/29/2021   ------------------------------------------------------------------------------------------------------------------ No results for input(s): "TSH", "T4TOTAL", "T3FREE", "THYROIDAB" in the last 72 hours.  Invalid input(s): "FREET3"  Cardiac Enzymes No results for input(s): "CKMB", "TROPONINI", "MYOGLOBIN" in the last 168 hours.  Invalid input(s): "CK" ------------------------------------------------------------------------------------------------------------------    Component Value Date/Time   BNP 76.0 01/08/2020 1450    CBG: Recent Labs  Lab 10/16/21 2147  GLUCAP 103*     Recent Results (from the past 240 hour(s))  Gastrointestinal Panel by PCR , Stool     Status: Abnormal   Collection Time: 10/16/21  9:37 PM   Specimen: Stool  Result Value Ref Range Status   Campylobacter species NOT DETECTED NOT DETECTED Final   Plesimonas shigelloides NOT DETECTED NOT DETECTED Final   Salmonella species NOT DETECTED NOT DETECTED Final   Yersinia enterocolitica NOT DETECTED NOT DETECTED Final   Vibrio species NOT DETECTED NOT DETECTED Final   Vibrio cholerae NOT DETECTED NOT DETECTED Final   Enteroaggregative E coli (EAEC) NOT DETECTED NOT DETECTED Final   Enteropathogenic E coli (EPEC) NOT DETECTED NOT DETECTED Final   Enterotoxigenic E coli (ETEC) DETECTED (A) NOT DETECTED Final    Comment: RESULT CALLED TO, READ BACK BY AND VERIFIED WITH: KRISTIN LANGLEY ON 10/17/21 AT 1840 QSD    Shiga like toxin producing E coli (STEC) NOT DETECTED NOT DETECTED Final   Shigella/Enteroinvasive E coli (EIEC) NOT DETECTED NOT DETECTED Final   Cryptosporidium NOT DETECTED NOT DETECTED Final   Cyclospora cayetanensis  NOT DETECTED NOT DETECTED Final   Entamoeba histolytica NOT DETECTED NOT DETECTED Final   Giardia lamblia NOT DETECTED NOT DETECTED Final   Adenovirus F40/41 NOT DETECTED NOT DETECTED Final   Astrovirus NOT DETECTED NOT DETECTED Final   Norovirus GI/GII NOT DETECTED NOT DETECTED Final   Rotavirus A NOT DETECTED NOT DETECTED Final   Sapovirus (I, II, IV, and V) NOT DETECTED NOT DETECTED Final    Comment: Performed at [facility], [address] [place], [place] [address]     Radiology Studies: No results found.   Marzetta Board, MD, PhD [facility]  Between 7 am - 7 pm I am available, please contact me via Amion (for emergencies) or Securechat (non urgent messages)  Between 7 pm - 7 am I am not available, please contact night coverage MD/APP via Amion

## 2021-10-18 NOTE — Progress Notes (Signed)
Patient nauseous with dry heaving, wants to vomit. Given PRN Zofran 4 mg IV. Pending results.

## 2021-10-19 ENCOUNTER — Inpatient Hospital Stay (HOSPITAL_COMMUNITY): Payer: Medicare Other | Admitting: Anesthesiology

## 2021-10-19 ENCOUNTER — Encounter (HOSPITAL_COMMUNITY)
Admission: EM | Disposition: A | Discharge status: Home / Self Care | Payer: Self-pay | Source: Home / Self Care | Attending: Internal Medicine

## 2021-10-19 ENCOUNTER — Encounter (HOSPITAL_COMMUNITY): Payer: Self-pay | Admitting: Internal Medicine

## 2021-10-19 DIAGNOSIS — T182XXA Foreign body in stomach, initial encounter: Secondary | ICD-10-CM

## 2021-10-19 DIAGNOSIS — N179 Acute kidney failure, unspecified: Secondary | ICD-10-CM | POA: Diagnosis not present

## 2021-10-19 DIAGNOSIS — T18128A Food in esophagus causing other injury, initial encounter: Secondary | ICD-10-CM

## 2021-10-19 DIAGNOSIS — K222 Esophageal obstruction: Secondary | ICD-10-CM

## 2021-10-19 DIAGNOSIS — E876 Hypokalemia: Secondary | ICD-10-CM | POA: Diagnosis not present

## 2021-10-19 DIAGNOSIS — K208 Other esophagitis without bleeding: Secondary | ICD-10-CM

## 2021-10-19 DIAGNOSIS — R9431 Abnormal electrocardiogram [ECG] [EKG]: Secondary | ICD-10-CM | POA: Diagnosis not present

## 2021-10-19 DIAGNOSIS — E871 Hypo-osmolality and hyponatremia: Secondary | ICD-10-CM | POA: Diagnosis not present

## 2021-10-19 HISTORY — PX: BIOPSY: SHX5522

## 2021-10-19 HISTORY — PX: ESOPHAGOGASTRODUODENOSCOPY (EGD) WITH PROPOFOL: SHX5813

## 2021-10-19 HISTORY — PX: FOREIGN BODY REMOVAL: SHX962

## 2021-10-19 LAB — CBC
HCT: 36.9 % (ref 36.0–46.0)
Hemoglobin: 12.7 g/dL (ref 12.0–15.0)
MCH: 29.8 pg (ref 26.0–34.0)
MCHC: 34.4 g/dL (ref 30.0–36.0)
MCV: 86.6 fL (ref 80.0–100.0)
Platelets: 252 10*3/uL (ref 150–400)
RBC: 4.26 MIL/uL (ref 3.87–5.11)
RDW: 16.2 % — ABNORMAL HIGH (ref 11.5–15.5)
WBC: 27.4 10*3/uL — ABNORMAL HIGH (ref 4.0–10.5)
nRBC: 0 % (ref 0.0–0.2)

## 2021-10-19 LAB — BASIC METABOLIC PANEL
Anion gap: 9 (ref 5–15)
BUN: 10 mg/dL (ref 8–23)
CO2: 22 mmol/L (ref 22–32)
Calcium: 9 mg/dL (ref 8.9–10.3)
Chloride: 103 mmol/L (ref 98–111)
Creatinine, Ser: 0.5 mg/dL (ref 0.44–1.00)
GFR, Estimated: >60 mL/min (ref >60)
Glucose, Bld: 112 mg/dL — ABNORMAL HIGH (ref 70–99)
Potassium: 4 mmol/L (ref 3.5–5.1)
Sodium: 134 mmol/L — ABNORMAL LOW (ref 135–145)

## 2021-10-19 SURGERY — ESOPHAGOGASTRODUODENOSCOPY (EGD) WITH PROPOFOL
Anesthesia: General

## 2021-10-19 MED ORDER — FENTANYL CITRATE (PF) 100 MCG/2ML IJ SOLN
INTRAMUSCULAR | Status: DC | PRN
  Administered 2021-10-19: 50 ug via INTRAVENOUS

## 2021-10-19 MED ORDER — DEXAMETHASONE SODIUM PHOSPHATE 10 MG/ML IJ SOLN
INTRAMUSCULAR | Status: DC | PRN
  Administered 2021-10-19: 10 mg via INTRAVENOUS

## 2021-10-19 MED ORDER — PROPOFOL 10 MG/ML IV BOLUS
INTRAVENOUS | Status: AC
  Filled 2021-10-19: qty 20

## 2021-10-19 MED ORDER — SUCCINYLCHOLINE CHLORIDE 200 MG/10ML IV SOSY
PREFILLED_SYRINGE | INTRAVENOUS | Status: DC | PRN
  Administered 2021-10-19: 100 mg via INTRAVENOUS

## 2021-10-19 MED ORDER — LACTATED RINGERS IV SOLN: INTRAVENOUS | Status: DC | PRN

## 2021-10-19 MED ORDER — LIDOCAINE 2% (20 MG/ML) 5 ML SYRINGE
INTRAMUSCULAR | Status: DC | PRN
  Administered 2021-10-19: 60 mg via INTRAVENOUS

## 2021-10-19 MED ORDER — FENTANYL CITRATE (PF) 100 MCG/2ML IJ SOLN
INTRAMUSCULAR | Status: AC
  Filled 2021-10-19: qty 2

## 2021-10-19 MED ORDER — SIMETHICONE 80 MG PO CHEW
80.0000 mg | CHEWABLE_TABLET | Freq: Four times a day (QID) | ORAL | Status: DC | PRN
  Administered 2021-10-19: 80 mg via ORAL
  Filled 2021-10-19: qty 1

## 2021-10-19 MED ORDER — ONDANSETRON HCL 4 MG/2ML IJ SOLN
INTRAMUSCULAR | Status: DC | PRN
  Administered 2021-10-19: 4 mg via INTRAVENOUS

## 2021-10-19 MED ORDER — PROPOFOL 10 MG/ML IV BOLUS
INTRAVENOUS | Status: DC | PRN
  Administered 2021-10-19: 140 mg via INTRAVENOUS

## 2021-10-19 MED ORDER — PHENYLEPHRINE 80 MCG/ML (10ML) SYRINGE FOR IV PUSH (FOR BLOOD PRESSURE SUPPORT)
PREFILLED_SYRINGE | INTRAVENOUS | Status: DC | PRN
  Administered 2021-10-19: 80 ug via INTRAVENOUS

## 2021-10-19 SURGICAL SUPPLY — 15 items

## 2021-10-19 NOTE — Progress Notes (Signed)
[name] 11:36 AM  Subjective: Patient with no new complaints still with nausea but no vomiting today no diarrhea but abdominal pain but no urinary complaints or cough  Objective: Vital signs stable afebrile exam please see preassessment evaluation abdomen still little softer possibly a little less tender white count increased questionable etiology  Assessment: Multiple medical problems and complaints  Plan: Okay to proceed with endoscopy with anesthesia assistance  [facility]  office [phone] After 5PM or if no answer call [phone]

## 2021-10-19 NOTE — Care Management Important Message (Signed)
Important Message  Patient Details IM Letter given to the Patient. Name: [name] MRN: [id number] Date of Birth: Nov 22, 1955   Medicare Important Message Given:  Yes     Abagayle, Klutts 10/19/2021, 12:22 PM

## 2021-10-19 NOTE — Consult Note (Signed)
Brief Psychiatry Consult Note  Received consult in late AM. At time pt in endoscopy suite receiving propofol; no safety concerns noted by primary MD. Briefly, pt with hx ?bipolar d/o admitted for N+V with prolonged Qtc of 504 on admission; has had repletion of K+ and Mg+ (both low or low-nl) and no repeat EKG available for review x3 days; receiving some Qtc prolonging medications (antiemetics). D/w Dr. Starla Link that due to pt receiving propofol with questionable ability to participate in interview will defer eval until tomorrow. Would likely start olanzapine (if qtc still prolonged and pt in need of mood stabilizing agent) - has good off-label evidence for N+V.   - to be seen tomorrow  We will sign off at this time. This has been communicated to the primary team. If issues arise in the future, don't hesitate to reconsult the Psychiatry Inpatient Consult Service.   Lavoris Sparling A Meika Earll

## 2021-10-19 NOTE — Progress Notes (Signed)
PROGRESS NOTE    [name]  ZWC:[id number] DOB: Jan 22, 1956 DOA: 10/16/2021 PCP: Bartholome Bill, MD   Brief Narrative:  66 year old lady with history of PTSD, schizoaffective disorder, depression, IBS, chronic pancreatitis, gastroparesis, tobacco use presented with worsening nausea, vomiting, diarrhea and abdominal cramping for the past month.  She apparently was admitted to the hospital for the same symptoms 3 weeks ago, was seen by GI and felt to be IBS-D.  Improved with supportive treatment and discharged home, however her symptoms recurred.  She was admitted again started on IV fluids.  GI was consulted.  Assessment & Plan:   Intractable nausea and vomiting -GI following.  Planning for EGD today.  Currently on colestipol and dicyclomine.  May be multifactorial in the setting of known gastroparesis as well as IBS-D -Still not feeling well.  Intermittently nauseous.  Diet advancement as per GI.  Leukocytosis Questionable cause.  Repeat a.m. labs.    Hyponatremia -mild.  Monitor   hypokalemia -Resolved.  Acute kidney injury -Resolved with IV fluids  QT prolongation -Avoid QT prolonging medications.  Repeat a.m. labs.  Schizoaffective disorder/bipolar disorder -Geodon on hold because of above.  Consult psychiatry to adjust her regimen.  Mild normocytic anemia -Resolved.  Hemoglobin stable with  DVT prophylaxis: SCDs Code Status: Full Family Communication: None at bedside Disposition Plan: Status is: Inpatient Remains inpatient appropriate because: Of severity of illness.  Consultants: GI  Procedures: None  Antimicrobials: None   Subjective: Patient seen and examined at bedside.  Does not feel well, does not elaborate on symptoms.  Still has intermittent nausea.  No overnight fever, seizures reported.  Objective: Vitals:   10/18/21 2026 10/19/21 0541 10/19/21 1025 10/19/21 1056  BP: (!) 154/83 138/87 140/80 (!) 152/88  Pulse: 65 78 80 75  Resp:   18  18  Temp: 97.6 F (36.4 C) (!) 97.4 F (36.3 C) 98.5 F (36.9 C) 98.1 F (36.7 C)  TempSrc: Oral Oral Oral Tympanic  SpO2: 94% 97% 92% 94%  Weight:    65 kg  Height:    '5\' 2"'$  (1.575 m)    Intake/Output Summary (Last 24 hours) at 10/19/2021 1128 Last data filed at 10/18/2021 1556 Gross per 24 hour  Intake 0 ml  Output 450 ml  Net -450 ml   Filed Weights   10/17/21 0120 10/19/21 1056  Weight: 65 kg 65 kg    Examination:  General exam: Appears calm and comfortable.  Currently on room air. Respiratory system: Bilateral decreased breath sounds at bases Cardiovascular system: S1 & S2 heard, Rate controlled Gastrointestinal system: Abdomen is nondistended, soft and nontender. Normal bowel sounds heard. Extremities: No cyanosis, clubbing, edema  Central nervous system: Awake, slow to respond, poor historian.  No focal neurological deficits. Moving extremities Skin: No rashes, lesions or ulcers Psychiatry: Looks anxious intermittently.  No signs of agitation currently.  Data Reviewed: I have personally reviewed following labs and imaging studies  CBC: Recent Labs  Lab 10/16/21 1042 10/17/21 0535 10/18/21 0536 10/19/21 0621  WBC 13.6* 10.7* 9.4 27.4*  NEUTROABS 11.6*  --   --   --   HGB 12.8 10.4* 11.3* 12.7  HCT 35.6* 30.1* 33.6* 36.9  MCV 81.8 84.8 88.0 86.6  PLT 372 246 248 235   Basic Metabolic Panel: Recent Labs  Lab 10/16/21 2319 10/17/21 0535 10/17/21 1404 10/17/21 2119 10/18/21 0536 10/19/21 0621  NA  --  129* 131* 131* 135 134*  K  --  3.5 3.5 3.1* 3.0* 4.0  CL  --  99 102 104 105 103  CO2  --  19* 20* 20* 24 22  GLUCOSE  --  90 109* 107* 119* 112*  BUN  --  '16 12 9 '$ 7* 10  CREATININE  --  1.16* 0.98 0.82 0.62 0.50  CALCIUM  --  8.8* 8.8* 8.7* 9.0 9.0  MG 1.8 1.8  --   --  1.9  --   PHOS 3.5 3.7  --   --   --   --    GFR: Estimated Creatinine Clearance: 62.1 mL/min (by C-G formula based on SCr of 0.5 mg/dL). Liver Function Tests: Recent Labs  Lab  10/16/21 1042 10/16/21 2319 10/17/21 0535 10/18/21 0536  AST 65* 50* 42* 29  ALT '28 25 22 21  '$ ALKPHOS 153* 131* 125 122  BILITOT 1.7* 1.3* 1.0 0.5  PROT 7.7 6.6 5.9* 6.3*  ALBUMIN 4.3 3.6 3.3* 3.5   Recent Labs  Lab 10/16/21 1042  LIPASE 19   No results for input(s): "AMMONIA" in the last 168 hours. Coagulation Profile: No results for input(s): "INR", "PROTIME" in the last 168 hours. Cardiac Enzymes: Recent Labs  Lab 10/16/21 2319 10/17/21 0535  CKTOTAL 498* 374*   BNP (last 3 results) No results for input(s): "PROBNP" in the last 8760 hours. HbA1C: No results for input(s): "HGBA1C" in the last 72 hours. CBG: Recent Labs  Lab 10/16/21 2147  GLUCAP 103*   Lipid Profile: No results for input(s): "CHOL", "HDL", "LDLCALC", "TRIG", "CHOLHDL", "LDLDIRECT" in the last 72 hours. Thyroid Function Tests: No results for input(s): "TSH", "T4TOTAL", "FREET4", "T3FREE", "THYROIDAB" in the last 72 hours. Anemia Panel: No results for input(s): "VITAMINB12", "FOLATE", "FERRITIN", "TIBC", "IRON", "RETICCTPCT" in the last 72 hours. Sepsis Labs: Recent Labs  Lab 10/16/21 2230  LATICACIDVEN 0.5    Recent Results (from the past 240 hour(s))  Gastrointestinal Panel by PCR , Stool     Status: Abnormal   Collection Time: 10/16/21  9:37 PM   Specimen: Stool  Result Value Ref Range Status   Campylobacter species NOT DETECTED NOT DETECTED Final   Plesimonas shigelloides NOT DETECTED NOT DETECTED Final   Salmonella species NOT DETECTED NOT DETECTED Final   Yersinia enterocolitica NOT DETECTED NOT DETECTED Final   Vibrio species NOT DETECTED NOT DETECTED Final   Vibrio cholerae NOT DETECTED NOT DETECTED Final   Enteroaggregative E coli (EAEC) NOT DETECTED NOT DETECTED Final   Enteropathogenic E coli (EPEC) NOT DETECTED NOT DETECTED Final   Enterotoxigenic E coli (ETEC) DETECTED (A) NOT DETECTED Final    Comment: RESULT CALLED TO, READ BACK BY AND VERIFIED WITH: KRISTIN LANGLEY ON  10/17/21 AT 1840 QSD    Shiga like toxin producing E coli (STEC) NOT DETECTED NOT DETECTED Final   Shigella/Enteroinvasive E coli (EIEC) NOT DETECTED NOT DETECTED Final   Cryptosporidium NOT DETECTED NOT DETECTED Final   Cyclospora cayetanensis NOT DETECTED NOT DETECTED Final   Entamoeba histolytica NOT DETECTED NOT DETECTED Final   Giardia lamblia NOT DETECTED NOT DETECTED Final   Adenovirus F40/41 NOT DETECTED NOT DETECTED Final   Astrovirus NOT DETECTED NOT DETECTED Final   Norovirus GI/GII NOT DETECTED NOT DETECTED Final   Rotavirus A NOT DETECTED NOT DETECTED Final   Sapovirus (I, II, IV, and V) NOT DETECTED NOT DETECTED Final    Comment: Performed at [facility], [address] [place], [place] [address]         Radiology Studies: No results found.  Scheduled Meds:  [MAR Hold] acidophilus  2 capsule Oral Daily   [MAR Hold] benztropine  1 mg Oral Daily   [MAR Hold] colestipol  1 g Oral BID   [MAR Hold] dicyclomine  10 mg Oral TID AC & HS   [MAR Hold] feeding supplement  1 Container Oral TID BM   [MAR Hold] gabapentin  300 mg Oral QHS   [MAR Hold] magnesium gluconate  500 mg Oral BID   [MAR Hold] multivitamin with minerals  1 tablet Oral Daily   [MAR Hold] nicotine  14 mg Transdermal Daily   [MAR Hold] pantoprazole  40 mg Oral BID AC   [MAR Hold] pneumococcal 20-valent conjugate vaccine  0.5 mL Intramuscular Tomorrow-1000   [MAR Hold] sertraline  100 mg Oral BID   [MAR Hold] sucralfate  1 g Oral QID   [MAR Hold] thiamine injection  100 mg Intravenous Q24H   [MAR Hold] traZODone  200 mg Oral QHS   Continuous Infusions:  sodium chloride 20 mL/hr at 10/18/21 2015          Aline August, MD [facility] 10/19/2021, 11:28 AM

## 2021-10-19 NOTE — Anesthesia Preprocedure Evaluation (Signed)
Anesthesia Evaluation  Patient identified by MRN, date of birth, ID band Patient awake    Reviewed: Allergy & Precautions, NPO status , Patient's Chart, lab work & pertinent test results  Airway Mallampati: II  TM Distance: <3 FB Neck ROM: Full    Dental  (+) Dental Advisory Given, Edentulous Upper, Edentulous Lower   Pulmonary Current Smoker,    Pulmonary exam normal breath sounds clear to auscultation       Cardiovascular hypertension, Pt. on medications Normal cardiovascular exam Rhythm:Regular Rate:Normal     Neuro/Psych PSYCHIATRIC DISORDERS Anxiety Depression Bipolar Disorder Schizophrenia negative neurological ROS     GI/Hepatic Neg liver ROS, GERD  Medicated,N/V, abdominal pain    Endo/Other  negative endocrine ROS  Renal/GU negative Renal ROS     Musculoskeletal  (+) Arthritis ,   Abdominal   Peds  Hematology negative hematology ROS (+)   Anesthesia Other Findings Day of surgery medications reviewed with the patient.  Reproductive/Obstetrics                             Anesthesia Physical Anesthesia Plan  ASA: 3  Anesthesia Plan: General   Post-op Pain Management:    Induction: Intravenous and Rapid sequence  PONV Risk Score and Plan: 2 and Dexamethasone and Ondansetron  Airway Management Planned: Oral ETT  Additional Equipment:   Intra-op Plan:   Post-operative Plan: Extubation in OR  Informed Consent: I have reviewed the patients History and Physical, chart, labs and discussed the procedure including the risks, benefits and alternatives for the proposed anesthesia with the patient or authorized representative who has indicated his/her understanding and acceptance.     Dental advisory given  Plan Discussed with: CRNA  Anesthesia Plan Comments:         Anesthesia Quick Evaluation

## 2021-10-19 NOTE — Plan of Care (Signed)
Pt NPO except meds this shift. Continues to have abdominal pain and nausea. Problem: Education: Goal: Knowledge of General Education information will improve Description: Including pain rating scale, medication(s)/side effects and non-pharmacologic comfort measures Outcome: Progressing   Problem: Health Behavior/Discharge Planning: Goal: Ability to manage health-related needs will improve Outcome: Progressing   Problem: Clinical Measurements: Goal: Ability to maintain clinical measurements within normal limits will improve Outcome: Progressing Goal: Will remain free from infection Outcome: Progressing Goal: Diagnostic test results will improve Outcome: Progressing Goal: Cardiovascular complication will be avoided Outcome: Progressing   Problem: Activity: Goal: Risk for activity intolerance will decrease Outcome: Progressing   Problem: Coping: Goal: Level of anxiety will decrease Outcome: Progressing   Problem: Elimination: Goal: Will not experience complications related to bowel motility Outcome: Progressing Goal: Will not experience complications related to urinary retention Outcome: Progressing   Problem: Pain Managment: Goal: General experience of comfort will improve Outcome: Progressing   Problem: Safety: Goal: Ability to remain free from injury will improve Outcome: Progressing   Problem: Skin Integrity: Goal: Risk for impaired skin integrity will decrease Outcome: Progressing

## 2021-10-19 NOTE — Op Note (Signed)
[facility] Patient Name: [name] Procedure Date: 10/19/2021 MRN: [id number] Attending MD: Clarene Essex , MD Date of Birth: 1956/03/04 CSN: [id number] Age: 66 Admit Type: Outpatient Procedure:                Upper GI endoscopy Indications:              Generalized abdominal pain, Nausea with vomiting                            history of gastroparesis Providers:                Clarene Essex, MD, Truddie Coco, RN, William Dalton,                            Technician Referring MD:              Medicines:                General Anesthesia Complications:            No immediate complications. Estimated Blood Loss:     Estimated blood loss: none. Procedure:                Pre-Anesthesia Assessment:                           - Prior to the procedure, a History and Physical                            was performed, and patient medications and                            allergies were reviewed. The patient's tolerance of                            previous anesthesia was also reviewed. The risks                            and benefits of the procedure and the sedation                            options and risks were discussed with the patient.                            All questions were answered, and informed consent                            was obtained. Prior Anticoagulants: The patient has                            taken no previous anticoagulant or antiplatelet                            agents. ASA Grade Assessment: II - A patient with                            mild systemic  disease. After reviewing the risks                            and benefits, the patient was deemed in                            satisfactory condition to undergo the procedure.                           After obtaining informed consent, the endoscope was                            passed under direct vision. Throughout the                            procedure, the patient's blood pressure,  pulse, and                            oxygen saturations were monitored continuously. The                            [id number]-[id number] ([id number] Olympus endoscope was introduced                            through the mouth, and advanced to the third part                            of duodenum. The upper GI endoscopy was somewhat                            difficult due to presence of food. Successful                            completion of the procedure was aided by performing                            the maneuvers documented (below) in this report.                            The patient tolerated the procedure well. Scope In: Scope Out: Findings:      Food was found in the middle third of the esophagus. Removal of food was       accomplished using the Raptor grasper and breaking it into multiple       pieces and washing it into the stomach.      A white coated esophagus probably from food debris with some underlying       esophagitis with no bleeding was found. Biopsies were taken with a cold       forceps for histology.      One benign-appearing, intrinsic moderate stenosis was found. The       stenosis was traversed.      A small amount of food (residue) was found in the gastric fundus.      The duodenal bulb, first portion of the duodenum, second portion of the  duodenum and third portion of the duodenum were normal.      The exam was otherwise without abnormality.      A small hiatal hernia was present. Impression:               - Food in the middle third of the esophagus.                            Removal was successful.                           - A white coated esophagus with some erosive                            esophagitis with no bleeding. Biopsied.                           - Benign-appearing esophageal stenosis.                           - A small amount of food (residue) in the stomach.                           - Normal duodenal bulb, first portion of the                             duodenum, second portion of the duodenum and third                            portion of the duodenum.                           - The examination was otherwise normal.                           - Small hiatal hernia. Moderate Sedation:      Not Applicable - Patient had care per Anesthesia. Recommendation:           - Clear liquid diet today. Might require repeat                            nuclear emptying study                           - Continue present medications. I think decreasing                            narcotics and readjusting her psych medicines to                            possibly help with improving motility and stomach                            emptying would be helpful- Await pathology results.                           -  Return to GI clinic PRN. We will check on tomorrow                           - Telephone GI clinic for pathology results in 1                            week.                           - Telephone GI clinic if symptomatic PRN. Procedure Code(s):        --- Professional ---                           [phone], Esophagogastroduodenoscopy, flexible,                            transoral; with removal of foreign body(s)                           43239, Esophagogastroduodenoscopy, flexible,                            transoral; with biopsy, single or multiple Diagnosis Code(s):        --- Professional ---                           I01.655V, Food in esophagus causing other injury,                            initial encounter                           K20.80, Other esophagitis without bleeding                           K22.2, Esophageal obstruction                           T18.2XXA, Foreign body in stomach, initial encounter                           R10.84, Generalized abdominal pain                           R11.2, Nausea with vomiting, unspecified CPT copyright 2019 American Medical Association. All rights reserved. The codes documented in  this report are preliminary and upon coder review may  be revised to meet current compliance requirements. Clarene Essex, MD 10/19/2021 12:38:41 PM This report has been signed electronically. Number of Addenda: 0

## 2021-10-19 NOTE — Transfer of Care (Signed)
Immediate Anesthesia Transfer of Care Note  Patient: [name]  Procedure(s) Performed: ESOPHAGOGASTRODUODENOSCOPY (EGD) WITH PROPOFOL FOREIGN BODY REMOVAL BIOPSY  Patient Location: Endoscopy Unit  Anesthesia Type:General  Level of Consciousness: drowsy  Airway & Oxygen Therapy: Patient Spontanous Breathing and Patient connected to face mask oxygen  Post-op Assessment: Report given to RN and Post -op Vital signs reviewed and stable  Post vital signs: Reviewed and stable  Last Vitals:  Vitals Value Taken Time  BP    Temp    Pulse    Resp    SpO2      Last Pain:  Vitals:   10/19/21 1056  TempSrc: Tympanic  PainSc: 8       Patients Stated Pain Goal: 2 (49/82/64 1583)  Complications: No notable events documented.

## 2021-10-19 NOTE — Anesthesia Procedure Notes (Signed)
Procedure Name: Intubation Date/Time: 10/19/2021 11:58 AM  Performed by: Sharlette Dense, CRNAPre-anesthesia Checklist: Patient identified, Emergency Drugs available, Suction available and Patient being monitored Patient Re-evaluated:Patient Re-evaluated prior to induction Oxygen Delivery Method: Circle system utilized Preoxygenation: Pre-oxygenation with 100% oxygen Induction Type: IV induction, Rapid sequence and Cricoid Pressure applied Laryngoscope Size: Miller and 2 Grade View: Grade I Tube type: Oral Tube size: 7.0 mm Number of attempts: 1 Airway Equipment and Method: Stylet Placement Confirmation: ETT inserted through vocal cords under direct vision, positive ETCO2 and breath sounds checked- equal and bilateral Secured at: 21 cm Tube secured with: Tape Dental Injury: Teeth and Oropharynx as per pre-operative assessment

## 2021-10-20 ENCOUNTER — Encounter (HOSPITAL_COMMUNITY): Payer: Self-pay | Admitting: Gastroenterology

## 2021-10-20 ENCOUNTER — Inpatient Hospital Stay (HOSPITAL_COMMUNITY): Payer: Medicare Other

## 2021-10-20 DIAGNOSIS — N179 Acute kidney failure, unspecified: Secondary | ICD-10-CM | POA: Diagnosis not present

## 2021-10-20 DIAGNOSIS — F259 Schizoaffective disorder, unspecified: Secondary | ICD-10-CM

## 2021-10-20 DIAGNOSIS — E871 Hypo-osmolality and hyponatremia: Secondary | ICD-10-CM | POA: Diagnosis not present

## 2021-10-20 DIAGNOSIS — F250 Schizoaffective disorder, bipolar type: Secondary | ICD-10-CM | POA: Diagnosis not present

## 2021-10-20 DIAGNOSIS — R9431 Abnormal electrocardiogram [ECG] [EKG]: Secondary | ICD-10-CM | POA: Diagnosis not present

## 2021-10-20 LAB — URINALYSIS, ROUTINE W REFLEX MICROSCOPIC
Bilirubin Urine: NEGATIVE
Glucose, UA: 150 mg/dL — AB
Hgb urine dipstick: NEGATIVE
Ketones, ur: NEGATIVE mg/dL
Nitrite: NEGATIVE
Protein, ur: 30 mg/dL — AB
Specific Gravity, Urine: 1.019 (ref 1.005–1.030)
WBC, UA: >50 WBC/hpf — ABNORMAL HIGH (ref 0–5)
pH: 5 (ref 5.0–8.0)

## 2021-10-20 LAB — CBC WITH DIFFERENTIAL/PLATELET
Abs Immature Granulocytes: 0.81 10*3/uL — ABNORMAL HIGH (ref 0.00–0.07)
Basophils Absolute: 0.1 10*3/uL (ref 0.0–0.1)
Basophils Relative: 0 %
Eosinophils Absolute: 0 10*3/uL (ref 0.0–0.5)
Eosinophils Relative: 0 %
HCT: 35 % — ABNORMAL LOW (ref 36.0–46.0)
Hemoglobin: 12 g/dL (ref 12.0–15.0)
Immature Granulocytes: 2 %
Lymphocytes Relative: 2 %
Lymphs Abs: 0.6 10*3/uL — ABNORMAL LOW (ref 0.7–4.0)
MCH: 29.6 pg (ref 26.0–34.0)
MCHC: 34.3 g/dL (ref 30.0–36.0)
MCV: 86.4 fL (ref 80.0–100.0)
Monocytes Absolute: 1.3 10*3/uL — ABNORMAL HIGH (ref 0.1–1.0)
Monocytes Relative: 4 %
Neutro Abs: 33.5 10*3/uL — ABNORMAL HIGH (ref 1.7–7.7)
Neutrophils Relative %: 92 %
Platelets: 238 10*3/uL (ref 150–400)
RBC: 4.05 MIL/uL (ref 3.87–5.11)
RDW: 16.4 % — ABNORMAL HIGH (ref 11.5–15.5)
WBC: 36.2 10*3/uL — ABNORMAL HIGH (ref 4.0–10.5)
nRBC: 0 % (ref 0.0–0.2)

## 2021-10-20 LAB — COMPREHENSIVE METABOLIC PANEL
ALT: 21 U/L (ref 0–44)
AST: 29 U/L (ref 15–41)
Albumin: 3.2 g/dL — ABNORMAL LOW (ref 3.5–5.0)
Alkaline Phosphatase: 122 U/L (ref 38–126)
Anion gap: 8 (ref 5–15)
BUN: 16 mg/dL (ref 8–23)
CO2: 23 mmol/L (ref 22–32)
Calcium: 9.2 mg/dL (ref 8.9–10.3)
Chloride: 103 mmol/L (ref 98–111)
Creatinine, Ser: 0.48 mg/dL (ref 0.44–1.00)
GFR, Estimated: >60 mL/min (ref >60)
Glucose, Bld: 154 mg/dL — ABNORMAL HIGH (ref 70–99)
Potassium: 3.6 mmol/L (ref 3.5–5.1)
Sodium: 134 mmol/L — ABNORMAL LOW (ref 135–145)
Total Bilirubin: 0.6 mg/dL (ref 0.3–1.2)
Total Protein: 6.6 g/dL (ref 6.5–8.1)

## 2021-10-20 LAB — SURGICAL PATHOLOGY

## 2021-10-20 LAB — MAGNESIUM: Magnesium: 1.8 mg/dL (ref 1.7–2.4)

## 2021-10-20 MED ORDER — SODIUM CHLORIDE 0.9 % IV SOLN
1.0000 g | INTRAVENOUS | Status: DC
  Administered 2021-10-20 – 2021-10-21 (×2): 1 g via INTRAVENOUS
  Filled 2021-10-20 (×3): qty 10

## 2021-10-20 MED ORDER — HYDROCODONE-ACETAMINOPHEN 7.5-325 MG/15ML PO SOLN
15.0000 mL | Freq: Four times a day (QID) | ORAL | Status: DC | PRN
  Administered 2021-10-20: 15 mL via ORAL
  Filled 2021-10-20: qty 15

## 2021-10-20 MED ORDER — ZIPRASIDONE HCL 80 MG PO CAPS
80.0000 mg | ORAL_CAPSULE | Freq: Two times a day (BID) | ORAL | Status: DC
Start: 2021-10-20 — End: 2021-10-22
  Administered 2021-10-20 – 2021-10-22 (×4): 80 mg via ORAL
  Filled 2021-10-20 (×5): qty 1

## 2021-10-20 NOTE — Progress Notes (Addendum)
[facility] Progress Note  [name] 66 y.o. 05/25/1955  CC: Intractable nausea and vomiting   Subjective: Patient states she continues to have nausea but has not had any vomiting.  Continues to not have any bowel movements at this time.  Can tolerate clear liquids but does not want to advance diet.  States she does not remember having a procedure yesterday but able to answer remainder of orientation questions without difficulty.  Denies abdominal pain.  Denies melena/hematochezia.  Denies fever/chills  ROS : Review of Systems  Constitutional:  Negative for chills, fever and weight loss.  Gastrointestinal:  Positive for nausea. Negative for abdominal pain, blood in stool, constipation, diarrhea, heartburn, melena and vomiting.      Objective: Vital signs in last 24 hours: Vitals:   10/19/21 2021 10/20/21 0506  BP: (!) 152/89 (!) 154/96  Pulse: 75 83  Resp: 19 18  Temp: 98.7 F (37.1 C) 98.4 F (36.9 C)  SpO2: 96% 92%    Physical Exam:  General:  Alert, cooperative, no distress, appears stated age  Head:  Normocephalic, without obvious abnormality, atraumatic  Eyes:  Anicteric sclera, EOM's intact  Lungs:   Clear to auscultation bilaterally, respirations unlabored  Heart:  Regular rate and rhythm, S1, S2 normal  Abdomen:   Soft, non-tender, bowel sounds active all four quadrants,  no masses,     Lab Results: Recent Labs    10/18/21 0536 10/19/21 0621 10/20/21 0535  NA 135 134* 134*  K 3.0* 4.0 3.6  CL 105 103 103  CO2 '24 22 23  '$ GLUCOSE 119* 112* 154*  BUN 7* 10 16  CREATININE 0.62 0.50 0.48  CALCIUM 9.0 9.0 9.2  MG 1.9  --  1.8   Recent Labs    10/18/21 0536 10/20/21 0535  AST 29 29  ALT 21 21  ALKPHOS 122 122  BILITOT 0.5 0.6  PROT 6.3* 6.6  ALBUMIN 3.5 3.2*   Recent Labs    10/19/21 0621 10/20/21 0535  WBC 27.4* 36.2*  NEUTROABS  --  33.5*  HGB 12.7 12.0  HCT 36.9 35.0*  MCV 86.6 86.4  PLT 252 238   No results for input(s):  "LABPROT", "INR" in the last 72 hours.    Assessment Intractable nausea vomiting -CT abdomen pelvis with contrast 7/9: No acute intra-abdominal process, chronic bilateral sacral fractures.  Mild sigmoid diverticulosis -GI pathogen panel positive for enterotoxigenic E. Coli - leukocytosis with WBC 36.2 (27.4) -Normal renal function -Temperature 98.4 F, pulse 83, blood pressure 154/96 -EGD 7/12: Food in the middle third of the esophagus (removed), white coated esophagus with some erosive esophagitis with no bleeding.  Benign-appearing esophageal stenosis.  Small amount of food in the stomach.  Normal duodenum.  Small hiatal hernia.  Biopsies pending   Plan: Patient continuing to have nausea but no vomiting.  Could consider repeat nuclear emptying study due to EGD findings, though may need to hold off due to leukocytosis. Also suggest that narcotics and psych meds may play a role in causing increased dysmotility of the GI tract. Elevated white blood cells with no clinical correlation at this time.  We will continue to monitor. Continue supportive care [facility] will follow  Garnette Scheuermann PA-C 10/20/2021, 8:31 AM  Contact #  [phone]

## 2021-10-20 NOTE — Progress Notes (Signed)
PROGRESS NOTE    [name]  YCX:[id number] DOB: 1955/12/16 DOA: 10/16/2021 PCP: Bartholome Bill, MD   Brief Narrative:  66 year old lady with history of PTSD, schizoaffective disorder, depression, IBS, chronic pancreatitis, gastroparesis, tobacco use presented with worsening nausea, vomiting, diarrhea and abdominal cramping for the past month.  She apparently was admitted to the hospital for the same symptoms 3 weeks ago, was seen by GI and felt to be IBS-D.  Improved with supportive treatment and discharged home, however her symptoms recurred.  She was admitted again started on IV fluids.  GI was consulted.  Assessment & Plan:   Intractable nausea and vomiting -GI following.  Status post EGD on 10/19/2021 showed food in the middle third of esophagus which was removed along with some erosive esophagitis with no bleeding; benign appearing esophageal stenosis with normal duodenum.  GI recommended to decrease narcotics and to adjust her psych medications to possibly help with improving motility and stomach emptying.  Patient elected repeat a nuclear emptying study.   -Currently on colestipol and dicyclomine since possibly has a component of IBS-D as well.  GI PCR was positive for enterotoxigenic E. coli.  No need for antibiotic treatment at this time.  Currently not having diarrhea. -Still not feeling well.  Intermittently nauseous.  Diet advancement as per GI.  Leukocytosis -Questionable cause.  WBCs have worsened to 36.2 today. no source of infection currently.  Monitor.  We will check blood cultures.  UA and chest x-ray.  Hyponatremia -mild.  Monitor   hypokalemia -Resolved.  Acute kidney injury -Resolved with IV fluids  QT prolongation -Avoid QT prolonging medications.  QT is still at 493 this morning.  Repeat a.m. EKG.  Schizoaffective disorder/bipolar disorder -Geodon on hold because of above.  Consult psychiatry to adjust her regimen.  Mild normocytic anemia -Resolved.   Hemoglobin stable with  DVT prophylaxis: SCDs Code Status: Full Family Communication: None at bedside Disposition Plan: Status is: Inpatient Remains inpatient appropriate because: Of severity of illness.  Consultants: GI/psychiatry  Procedures: EGD on 10/19/2021 Impression:               - Food in the middle third of the esophagus.                            Removal was successful.                           - A white coated esophagus with some erosive                            esophagitis with no bleeding. Biopsied.                           - Benign-appearing esophageal stenosis.                           - A small amount of food (residue) in the stomach.                           - Normal duodenal bulb, first portion of the                            duodenum, second  portion of the duodenum and third                            portion of the duodenum.                           - The examination was otherwise normal.                           - Small hiatal hernia. Moderate Sedation:      Not Applicable - Patient had care per Anesthesia. Recommendation:           - Clear liquid diet today. Might require repeat                            nuclear emptying study                           - Continue present medications. I think decreasing                            narcotics and readjusting her psych medicines to                            possibly help with improving motility and stomach                            emptying would be helpful- Await pathology results.                           - Return to GI clinic PRN. We will check on tomorrow                           - Telephone GI clinic for pathology results in 1                            week.                           - Telephone GI clinic if symptomatic PRN.  Antimicrobials: None   Subjective: Patient seen and examined at bedside.  She complains of intermittent nausea with abdominal pain.  Poor historian: States that she  did not have EGD yesterday similar states that she refused it.  No overnight chest pain, fever, agitation reported.  Objective: Vitals:   10/19/21 1329 10/19/21 1600 10/19/21 2021 10/20/21 0506  BP: 136/75 128/73 (!) 152/89 (!) 154/96  Pulse: 71 68 75 83  Resp: '18 20 19 18  '$ Temp: 98.1 F (36.7 C) (!) 97.5 F (36.4 C) 98.7 F (37.1 C) 98.4 F (36.9 C)  TempSrc: Oral Oral    SpO2: 94% 98% 96% 92%  Weight:      Height:        Intake/Output Summary (Last 24 hours) at 10/20/2021 0724 Last data filed at 10/19/2021 1700 Gross per 24 hour  Intake 490 ml  Output --  Net 490 ml    Filed Weights   10/17/21 0120 10/19/21 1056  Weight: 65  kg 65 kg    Examination:  General: On room air.  No distress.  Looks chronically ill and deconditioned. ENT/neck: No thyromegaly.  JVD is not elevated  respiratory: Decreased breath sounds at bases bilaterally with some crackles; no wheezing CVS: S1-S2 heard, rate controlled Abdominal: Soft, nontender, slightly distended; no organomegaly, normal bowel sounds are heard Extremities: Trace lower extremity edema; no cyanosis  CNS: Awake and alert.  Slow to respond.  Poor historian.  No focal neurologic deficit.  Moves extremities Lymph: No obvious lymphadenopathy Skin: No obvious ecchymosis/lesions  psych: Affect is extremely flat.  Currently not agitated.   Musculoskeletal: No obvious joint swelling/deformity   Data Reviewed: I have personally reviewed following labs and imaging studies  CBC: Recent Labs  Lab 10/16/21 1042 10/17/21 0535 10/18/21 0536 10/19/21 0621 10/20/21 0535  WBC 13.6* 10.7* 9.4 27.4* 36.2*  NEUTROABS 11.6*  --   --   --  33.5*  HGB 12.8 10.4* 11.3* 12.7 12.0  HCT 35.6* 30.1* 33.6* 36.9 35.0*  MCV 81.8 84.8 88.0 86.6 86.4  PLT 372 246 248 252 468    Basic Metabolic Panel: Recent Labs  Lab 10/16/21 2319 10/17/21 0535 10/17/21 1404 10/17/21 2119 10/18/21 0536 10/19/21 0621 10/20/21 0535  NA  --  129* 131*  131* 135 134* 134*  K  --  3.5 3.5 3.1* 3.0* 4.0 3.6  CL  --  99 102 104 105 103 103  CO2  --  19* 20* 20* '24 22 23  '$ GLUCOSE  --  90 109* 107* 119* 112* 154*  BUN  --  '16 12 9 '$ 7* 10 16  CREATININE  --  1.16* 0.98 0.82 0.62 0.50 0.48  CALCIUM  --  8.8* 8.8* 8.7* 9.0 9.0 9.2  MG 1.8 1.8  --   --  1.9  --  1.8  PHOS 3.5 3.7  --   --   --   --   --     GFR: Estimated Creatinine Clearance: 62.1 mL/min (by C-G formula based on SCr of 0.48 mg/dL). Liver Function Tests: Recent Labs  Lab 10/16/21 1042 10/16/21 2319 10/17/21 0535 10/18/21 0536 10/20/21 0535  AST 65* 50* 42* 29 29  ALT '28 25 22 21 21  '$ ALKPHOS 153* 131* 125 122 122  BILITOT 1.7* 1.3* 1.0 0.5 0.6  PROT 7.7 6.6 5.9* 6.3* 6.6  ALBUMIN 4.3 3.6 3.3* 3.5 3.2*    Recent Labs  Lab 10/16/21 1042  LIPASE 19    No results for input(s): "AMMONIA" in the last 168 hours. Coagulation Profile: No results for input(s): "INR", "PROTIME" in the last 168 hours. Cardiac Enzymes: Recent Labs  Lab 10/16/21 2319 10/17/21 0535  CKTOTAL 498* 374*    BNP (last 3 results) No results for input(s): "PROBNP" in the last 8760 hours. HbA1C: No results for input(s): "HGBA1C" in the last 72 hours. CBG: Recent Labs  Lab 10/16/21 2147  GLUCAP 103*    Lipid Profile: No results for input(s): "CHOL", "HDL", "LDLCALC", "TRIG", "CHOLHDL", "LDLDIRECT" in the last 72 hours. Thyroid Function Tests: No results for input(s): "TSH", "T4TOTAL", "FREET4", "T3FREE", "THYROIDAB" in the last 72 hours. Anemia Panel: No results for input(s): "VITAMINB12", "FOLATE", "FERRITIN", "TIBC", "IRON", "RETICCTPCT" in the last 72 hours. Sepsis Labs: Recent Labs  Lab 10/16/21 2230  LATICACIDVEN 0.5     Recent Results (from the past 240 hour(s))  Gastrointestinal Panel by PCR , Stool     Status: Abnormal   Collection Time: 10/16/21  9:37 PM  Specimen: Stool  Result Value Ref Range Status   Campylobacter species NOT DETECTED NOT DETECTED Final    Plesimonas shigelloides NOT DETECTED NOT DETECTED Final   Salmonella species NOT DETECTED NOT DETECTED Final   Yersinia enterocolitica NOT DETECTED NOT DETECTED Final   Vibrio species NOT DETECTED NOT DETECTED Final   Vibrio cholerae NOT DETECTED NOT DETECTED Final   Enteroaggregative E coli (EAEC) NOT DETECTED NOT DETECTED Final   Enteropathogenic E coli (EPEC) NOT DETECTED NOT DETECTED Final   Enterotoxigenic E coli (ETEC) DETECTED (A) NOT DETECTED Final    Comment: RESULT CALLED TO, READ BACK BY AND VERIFIED WITH: KRISTIN LANGLEY ON 10/17/21 AT 1840 QSD    Shiga like toxin producing E coli (STEC) NOT DETECTED NOT DETECTED Final   Shigella/Enteroinvasive E coli (EIEC) NOT DETECTED NOT DETECTED Final   Cryptosporidium NOT DETECTED NOT DETECTED Final   Cyclospora cayetanensis NOT DETECTED NOT DETECTED Final   Entamoeba histolytica NOT DETECTED NOT DETECTED Final   Giardia lamblia NOT DETECTED NOT DETECTED Final   Adenovirus F40/41 NOT DETECTED NOT DETECTED Final   Astrovirus NOT DETECTED NOT DETECTED Final   Norovirus GI/GII NOT DETECTED NOT DETECTED Final   Rotavirus A NOT DETECTED NOT DETECTED Final   Sapovirus (I, II, IV, and V) NOT DETECTED NOT DETECTED Final    Comment: Performed at [facility], [address], [place], [place] [address]         Radiology Studies: No results found.      Scheduled Meds:  acidophilus  2 capsule Oral Daily   benztropine  1 mg Oral Daily   colestipol  1 g Oral BID   dicyclomine  10 mg Oral TID AC & HS   feeding supplement  1 Container Oral TID BM   gabapentin  300 mg Oral QHS   magnesium gluconate  500 mg Oral BID   multivitamin with minerals  1 tablet Oral Daily   nicotine  14 mg Transdermal Daily   pantoprazole  40 mg Oral BID AC   pneumococcal 20-valent conjugate vaccine  0.5 mL Intramuscular Tomorrow-1000   sertraline  100 mg Oral BID   sucralfate  1 g Oral QID   thiamine injection  100 mg Intravenous Q24H   traZODone   200 mg Oral QHS   Continuous Infusions:          Aline August, MD [facility] 10/20/2021, 7:24 AM

## 2021-10-20 NOTE — Anesthesia Postprocedure Evaluation (Signed)
Anesthesia Post Note  Patient: [name]  Procedure(s) Performed: ESOPHAGOGASTRODUODENOSCOPY (EGD) WITH PROPOFOL FOREIGN BODY REMOVAL BIOPSY     Patient location during evaluation: Endoscopy Anesthesia Type: General Level of consciousness: awake and alert Pain management: pain level controlled Vital Signs Assessment: post-procedure vital signs reviewed and stable Respiratory status: spontaneous breathing, nonlabored ventilation, respiratory function stable and patient connected to nasal cannula oxygen Cardiovascular status: blood pressure returned to baseline and stable Postop Assessment: no apparent nausea or vomiting Anesthetic complications: no   No notable events documented.  Last Vitals:  Vitals:   10/19/21 2021 10/20/21 0506  BP: (!) 152/89 (!) 154/96  Pulse: 75 83  Resp: 19 18  Temp: 37.1 C 36.9 C  SpO2: 96% 92%    Last Pain:  Vitals:   10/20/21 0626  TempSrc:   PainSc: [place]

## 2021-10-20 NOTE — Consult Note (Signed)
[facility] Face-to-Face Psychiatry Consult   Reason for Consult:  Schizoaffective disorder, on multiple meds including Geodon. Geodon held due to prolonged QTc.  Referring Physician:  Dr. Starla Link Patient Identification: [name] MRN:  [id number] Principal Diagnosis: Hyponatremia Diagnosis:  Principal Problem:   Hyponatremia Active Problems:   HTN (hypertension)   Hypokalemia   Schizoaffective disorder, bipolar type (HCC)   Dehydration   QT prolongation   Diarrhea   AKI (acute kidney injury) ([place])   Tobacco abuse   Elevated CK   Total Time spent with patient: 45 minutes  Subjective:   [name] is a 66 y.o. female patient admitted with n/v. Psych consulted for hx of psychiatric disorder on meds, prolonged QTc; geodon stopped. Repeat EKG obtained after correction of hypokalemia, QTc prolongation improved. Patient endorses a past psychiatry history of depression, schizoaffective disorder bipolar type, and anxiety. SHe reports almost complete cessation of all psychiatric symptoms at this time on her current regimen. SHe denies any acute concerns at this time, and states "I  came in for my nausea and vomiting, not my psych meds. Dont touch those it took a long time to get me here. " She currently sees Dr. Mckinley Jewel with [facility]. She is taking Geodon '80mg'$  po BID, Cogentin, gabapentin '400mg'$ ,Trazodone , and zoloft '100mg'$  po daily.   She denies any acute symptoms of mania at this time to include impulsivity, grandiosity, mood lability, hypersexuality.  She does not present with any of the above symptoms on this evaluation.  She further denies any depressive symptoms to include anhedonia, hopelessness, worthlessness, guilty, suicidal.  She denies any acute psychosis, paranoia.  She does not appear to be displaying any or responding to internal stimuli, external stimuli, or exhibiting delusional thought disorder.  Patient denies any access to weapons, denies any alcohol and or  substance abuse.  She reports moderate sleep and fair appetite.  She also is receiving services through [facility], and reports compliance with her medications. She does not have a caregiver assist with medication management although she reports compliance with most of her appointments and refills (chart verified).  Patient denies any auditory and/or visual hallucinations, does not appear to be responding to internal or external stimuli.  There is no evidence of delusional thought content and patient appears to answer all questions appropriately.  At this time patient appears to be psychiatrically stable to discharge home, with support system services in place.       HPI:  [name] is a a 66 year old patient with Bipolar admitted for N?V with prolonged QTc 504 on admission. Prior to this admission patient has history of hypokalemia, and working with PCP to manage potassium levels.   Past Psychiatric History: See above  Risk to Self:  Denies Risk to Others:  Denies Prior Inpatient Therapy:   Denies Prior Outpatient Therapy:  Denies  Past Medical History:  Past Medical History:  Diagnosis Date   DDD (degenerative disc disease), lumbar    Depression    Diverticulosis    Dizziness    GERD (gastroesophageal reflux disease)    IBS (irritable bowel syndrome)    Internal hemorrhoid    2nd degree   Myocarditis ([place]) 2009   Osteoarthritis    Prolapsed internal hemorrhoids, grade 3 12/01/2015   Recurrent falls    Schizoaffective disorder ([place])    Tubular adenoma of colon 08/2011    Past Surgical History:  Procedure Laterality Date   BREAST BIOPSY Right 12/27/2012  CHOLECYSTECTOMY     HEMORRHOID BANDING     INTRAMEDULLARY (IM) NAIL INTERTROCHANTERIC Left 01/24/2020   Procedure: INTRAMEDULLARY (IM) NAIL INTERTROCHANTRIC;  Surgeon: Leandrew Koyanagi, MD;  Location: [facility];  Service: Orthopedics;  Laterality: Left;   Family History:  Family History  Problem  Relation Age of Onset   Suicidality Father    Depression Father    Suicidality Sister    Depression Sister    Suicidality Cousin    Suicidality Other    Family Psychiatric  History: Denies Social History:  Social History   Substance and Sexual Activity  Alcohol Use No   Alcohol/week: 0.0 standard drinks of alcohol     Social History   Substance and Sexual Activity  Drug Use No    Social History   Socioeconomic History   Marital status: Divorced    Spouse name: Not on file   Number of children: 2   Years of education: Not on file   Highest education level: Not on file  Occupational History   Occupation: Disabled  Tobacco Use   Smoking status: Every Day    Packs/day: 0.50    Years: 38.00    Total pack years: 19.00    Types: Cigarettes   Smokeless tobacco: Never  Vaping Use   Vaping Use: Never used  Substance and Sexual Activity   Alcohol use: No    Alcohol/week: 0.0 standard drinks of alcohol   Drug use: No   Sexual activity: Not Currently  Other Topics Concern   Not on file  Social History Narrative   Not on file   Social Determinants of Health   Financial Resource Strain: Not on file  Food Insecurity: Not on file  Transportation Needs: Not on file  Physical Activity: Not on file  Stress: Not on file  Social Connections: Not on file   Additional Social History:    Allergies:   Allergies  Allergen Reactions   Penicillins Rash          Sulfa Antibiotics Rash    Labs:  Results for orders placed or performed during the hospital encounter of 10/16/21 (from the past 48 hour(s))  Basic metabolic panel     Status: Abnormal   Collection Time: 10/19/21  6:21 AM  Result Value Ref Range   Sodium 134 (L) 135 - 145 mmol/L   Potassium 4.0 3.5 - 5.1 mmol/L   Chloride 103 98 - 111 mmol/L   CO2 22 22 - 32 mmol/L   Glucose, Bld 112 (H) 70 - 99 mg/dL    Comment: Glucose reference range applies only to samples taken after fasting for at least 8 hours.    BUN 10 8 - 23 mg/dL   Creatinine, Ser 0.50 0.44 - 1.00 mg/dL   Calcium 9.0 8.9 - 10.3 mg/dL   GFR, Estimated >60 >60 mL/min    Comment: (NOTE) Calculated using the CKD-EPI Creatinine Equation (2021)    Anion gap 9 5 - 15    Comment: Performed at [facility], [place] [address], [place], [place] [address]  CBC     Status: Abnormal   Collection Time: 10/19/21  6:21 AM  Result Value Ref Range   WBC 27.4 (H) 4.0 - 10.5 K/uL   RBC 4.26 3.87 - 5.11 MIL/uL   Hemoglobin 12.7 12.0 - 15.0 g/dL   HCT 36.9 36.0 - 46.0 %   MCV 86.6 80.0 - 100.0 fL   MCH 29.8 26.0 - 34.0 pg   MCHC 34.4  30.0 - 36.0 g/dL   RDW 16.2 (H) 11.5 - 15.5 %   Platelets 252 150 - 400 K/uL    Comment: SPECIMEN CHECKED FOR CLOTS REPEATED TO VERIFY PLATELET COUNT CONFIRMED BY SMEAR    nRBC 0.0 0.0 - 0.2 %    Comment: Performed at [facility], [place] [address] [place], [place] [address]  CBC with Differential/Platelet     Status: Abnormal   Collection Time: 10/20/21  5:35 AM  Result Value Ref Range   WBC 36.2 (H) 4.0 - 10.5 K/uL   RBC 4.05 3.87 - 5.11 MIL/uL   Hemoglobin 12.0 12.0 - 15.0 g/dL   HCT 35.0 (L) 36.0 - 46.0 %   MCV 86.4 80.0 - 100.0 fL   MCH 29.6 26.0 - 34.0 pg   MCHC 34.3 30.0 - 36.0 g/dL   RDW 16.4 (H) 11.5 - 15.5 %   Platelets 238 150 - 400 K/uL   nRBC 0.0 0.0 - 0.2 %   Neutrophils Relative % 92 %   Neutro Abs 33.5 (H) 1.7 - 7.7 K/uL   Lymphocytes Relative 2 %   Lymphs Abs 0.6 (L) 0.7 - 4.0 K/uL   Monocytes Relative 4 %   Monocytes Absolute 1.3 (H) 0.1 - 1.0 K/uL   Eosinophils Relative 0 %   Eosinophils Absolute 0.0 0.0 - 0.5 K/uL   Basophils Relative 0 %   Basophils Absolute 0.1 0.0 - 0.1 K/uL   Immature Granulocytes 2 %   Abs Immature Granulocytes 0.81 (H) 0.00 - 0.07 K/uL    Comment: Performed at [facility], [place] [address] [place], [place] [address]  Comprehensive metabolic panel     Status: Abnormal   Collection Time: 10/20/21  5:35  AM  Result Value Ref Range   Sodium 134 (L) 135 - 145 mmol/L   Potassium 3.6 3.5 - 5.1 mmol/L   Chloride 103 98 - 111 mmol/L   CO2 23 22 - 32 mmol/L   Glucose, Bld 154 (H) 70 - 99 mg/dL    Comment: Glucose reference range applies only to samples taken after fasting for at least 8 hours.   BUN 16 8 - 23 mg/dL   Creatinine, Ser 0.48 0.44 - 1.00 mg/dL   Calcium 9.2 8.9 - 10.3 mg/dL   Total Protein 6.6 6.5 - 8.1 g/dL   Albumin 3.2 (L) 3.5 - 5.0 g/dL   AST 29 15 - 41 U/L   ALT 21 0 - 44 U/L   Alkaline Phosphatase 122 38 - 126 U/L   Total Bilirubin 0.6 0.3 - 1.2 mg/dL   GFR, Estimated >60 >60 mL/min    Comment: (NOTE) Calculated using the CKD-EPI Creatinine Equation (2021)    Anion gap 8 5 - 15    Comment: Performed at [facility], [place] [address] [place], [place] [address]  Magnesium     Status: None   Collection Time: 10/20/21  5:35 AM  Result Value Ref Range   Magnesium 1.8 1.7 - 2.4 mg/dL    Comment: Performed at [facility], [place] [address], [place], [place] [address]    Current Facility-Administered Medications  Medication Dose Route Frequency Provider Last Rate Last Admin   acetaminophen (TYLENOL) tablet 650 mg  650 mg Oral Q6H PRN Clarene Essex, MD   650 mg at 10/19/21 2120   Or   acetaminophen (TYLENOL) suppository 650 mg  650 mg Rectal Q6H PRN Clarene Essex, MD  acidophilus (RISAQUAD) capsule 2 capsule  2 capsule Oral Daily Clarene Essex, MD   2 capsule at 10/19/21 0915   benztropine (COGENTIN) tablet 1 mg  1 mg Oral Daily Clarene Essex, MD   1 mg at 10/19/21 3329   colestipol (COLESTID) tablet 1 g  1 g Oral BID Clarene Essex, MD   1 g at 10/19/21 2120   cyclobenzaprine (FLEXERIL) tablet 5 mg  5 mg Oral TID PRN Clarene Essex, MD   5 mg at 10/20/21 0541   dicyclomine (BENTYL) capsule 10 mg  10 mg Oral TID AC & HS Clarene Essex, MD   10 mg at 10/19/21 2120   feeding supplement (BOOST / RESOURCE BREEZE) liquid 1 Container  1 Container Oral TID BM  Clarene Essex, MD   1 Container at 10/19/21 2005   gabapentin (NEURONTIN) capsule 300 mg  300 mg Oral QHS Clarene Essex, MD   300 mg at 10/19/21 2121   HYDROcodone-acetaminophen (HYCET) 7.5-325 mg/15 ml solution 10-20 mL  10-20 mL Oral Q4H PRN Clarene Essex, MD   20 mL at 10/20/21 0541   loperamide (IMODIUM) capsule 2 mg  2 mg Oral PRN Clarene Essex, MD   2 mg at 10/16/21 2231   magnesium gluconate (MAGONATE) tablet 500 mg  500 mg Oral BID Clarene Essex, MD   500 mg at 10/19/21 2120   multivitamin with minerals tablet 1 tablet  1 tablet Oral Daily Clarene Essex, MD   1 tablet at 10/19/21 0915   nicotine (NICODERM CQ - dosed in mg/24 hours) patch 14 mg  14 mg Transdermal Daily Clarene Essex, MD   14 mg at 10/18/21 1010   ondansetron (ZOFRAN) injection 4 mg  4 mg Intravenous Q6H PRN Clarene Essex, MD   4 mg at 10/19/21 2005   Oral care mouth rinse  15 mL Mouth Rinse PRN Clarene Essex, MD       pantoprazole (PROTONIX) EC tablet 40 mg  40 mg Oral BID AC Clarene Essex, MD   40 mg at 10/19/21 1600   pneumococcal 20-valent conjugate vaccine (PREVNAR 20) injection 0.5 mL  0.5 mL Intramuscular Tomorrow-1000 Clarene Essex, MD       prochlorperazine (COMPAZINE) injection 10 mg  10 mg Intravenous Q4H PRN Clarene Essex, MD   10 mg at 10/18/21 2234   sertraline (ZOLOFT) tablet 100 mg  100 mg Oral BID Clarene Essex, MD   100 mg at 10/19/21 2120   simethicone (MYLICON) chewable tablet 80 mg  80 mg Oral Q6H PRN Aline August, MD   80 mg at 10/19/21 1612   sucralfate (CARAFATE) 1 GM/10ML suspension 1 g  1 g Oral QID Clarene Essex, MD   1 g at 10/19/21 2121   thiamine (B-1) injection 100 mg  100 mg Intravenous Q24H Clarene Essex, MD   100 mg at 10/20/21 0112   traZODone (DESYREL) tablet 200 mg  200 mg Oral QHS Clarene Essex, MD   200 mg at 10/19/21 2120    Musculoskeletal: Strength & Muscle Tone:  UTA lying in bed Gait & Station: UTA Patient leans: UTA            Psychiatric Specialty Exam:  Presentation  General Appearance:  Appropriate for Environment; Fairly Groomed; Casual  Eye Contact:Good  Speech:Clear and Coherent; Normal Rate  Speech Volume:Decreased  Handedness:Right   Mood and Affect  Mood:Euthymic (sad becasue Im in here)  Affect:Appropriate; Congruent   Thought Process  Thought Processes:Coherent; Linear  Descriptions of Associations:Intact  Orientation:Full (Time,  Place and Person)  Thought Content:Logical  History of Schizophrenia/Schizoaffective disorder:No data recorded Duration of Psychotic Symptoms:No data recorded Hallucinations:Hallucinations: None  Ideas of Reference:None  Suicidal Thoughts:Suicidal Thoughts: No  Homicidal Thoughts:Homicidal Thoughts: No   Sensorium  Memory:Immediate Fair; Recent Fair; Remote Fair  Judgment:Good  Insight:Good   Executive Functions  Concentration:Good  Attention Span:Good  [place]  Language:Good   Psychomotor Activity  Psychomotor Activity:Psychomotor Activity: Normal   Assets  Assets:Desire for Improvement; Financial Resources/Insurance; Housing; Social Support; Resilience   Sleep  Sleep:No data recorded  Physical Exam: Physical Exam Vitals and nursing note reviewed.  Constitutional:      Appearance: She is well-developed and normal weight.  HENT:     Head: Normocephalic.  Cardiovascular:     Rate and Rhythm: Normal rate.  Skin:    General: Skin is warm.  Neurological:     General: No focal deficit present.     Mental Status: She is alert and oriented to person, place, and time.  Psychiatric:        Mood and Affect: Mood normal.        Behavior: Behavior normal.    Review of Systems  Psychiatric/Behavioral: Negative.    All other systems reviewed and are negative.  Blood pressure (!) 154/96, pulse 83, temperature 98.4 F (36.9 C), resp. rate 18, height '5\' 2"'$  (1.575 m), weight 65 kg, SpO2 92 %. Body mass index is 26.21 kg/m.  Treatment Plan Summary: Plan Continue  current home medications at this time.   -Patient is currently stabilized on current home medications, she presents with no acute psychiatric symptoms that warrant inpatient consult service.   -Prolonged QTc has resolved. Limit use of QTc prolongation medications(antiemetics). Continue working closely with PCP to help increase your potassium levels.   -FOllow up with outpatient psychiatry, Dr. Mckinley Jewel.   Psychiatry to sign off at this time.   Disposition: No evidence of imminent risk to self or others at present.    Suella Broad, FNP 10/20/2021 8:38 AM

## 2021-10-20 NOTE — Progress Notes (Signed)
Physical Therapy Treatment Patient Details Name: [name] MRN: [id number] DOB: 1956-02-11 Today's Date: 10/20/2021   History of Present Illness 66 yo female admitted with hyponatremia, weakness. Pt reports falls at home prior to admission. Hx of L hip IM nail, PTSD, schizophrenia, recurrent N/V/D, pancreatitis    PT Comments    Pt supine in bed with complaints of high level of pain (10/10) in her abdomen but agreeable to seen. Modified independent with bed mobility, min guard for transfer and ambulation in hallway with RW ~11f, distance limited by fatigue, reporting "I feel weak." Encouraged ambulation outside of therapy session with staff and updated whiteboard, pt verbalized understanding and RN notified. We will continue to follow acutely.    Recommendations for follow up therapy are one component of a multi-disciplinary discharge planning process, led by the attending physician.  Recommendations may be updated based on patient status, additional functional criteria and insurance authorization.  Follow Up Recommendations  Home health PT     Assistance Recommended at Discharge Intermittent Supervision/Assistance  Patient can return home with the following A little help with walking and/or transfers;Assist for transportation   Equipment Recommendations  None recommended by PT    Recommendations for Other Services       Precautions / Restrictions Precautions Precautions: Fall Precaution Comments: monitor BP Restrictions Weight Bearing Restrictions: No     Mobility  Bed Mobility Overal bed mobility: Modified Independent Bed Mobility: Sit to Supine, Supine to Sit     Supine to sit: Modified independent (Device/Increase time) Sit to supine: Modified independent (Device/Increase time)   General bed mobility comments: increased time.    Transfers Overall transfer level: Needs assistance Equipment used: Rolling walker (2 wheels) Transfers: Sit to/from Stand Sit to  Stand: Min guard           General transfer comment: min guard for safety.    Ambulation/Gait Ambulation/Gait assistance: Min guard Gait Distance (Feet): 40 Feet Assistive device: Rolling walker (2 wheels) Gait Pattern/deviations: Step-through pattern, Decreased stride length Gait velocity: decreased     General Gait Details: min guard for safety. slow gait speed. pt fatigues fairly easily   SChief Strategy Officer   Modified Rankin (Stroke Patients Only)       Balance Overall balance assessment: Needs assistance, History of Falls Sitting-balance support: Feet supported, No upper extremity supported Sitting balance-Leahy Scale: Fair     Standing balance support: Bilateral upper extremity supported, During functional activity, Reliant on assistive device for balance Standing balance-Leahy Scale: Fair                              Cognition Arousal/Alertness: Awake/alert Behavior During Therapy: WFL for tasks assessed/performed Overall Cognitive Status: Within Functional Limits for tasks assessed                                          Exercises      General Comments        Pertinent Vitals/Pain Pain Assessment Pain Assessment: 0-10 Pain Score: 10-Worst pain ever Faces Pain Scale: Hurts a little bit Breathing: normal Negative Vocalization: none Facial Expression: smiling or inexpressive Body Language: relaxed Consolability: no need to console PAINAD Score: 0 Pain Location: abdomen Pain Descriptors / Indicators: Discomfort, Grimacing Pain Intervention(s): Monitored during  session, Repositioned, Patient requesting pain meds-RN notified    Home Living                          Prior Function            PT Goals (current goals can now be found in the care plan section) Acute Rehab PT Goals Patient Stated Goal: to feel better PT Goal Formulation: With patient Time For Goal Achievement:  10/31/21 Potential to Achieve Goals: Good Progress towards PT goals: Progressing toward goals    Frequency    Min 3X/week      PT Plan Current plan remains appropriate    Co-evaluation              AM-PAC PT "6 Clicks" Mobility   Outcome Measure  Help needed turning from your back to your side while in a flat bed without using bedrails?: None Help needed moving from lying on your back to sitting on the side of a flat bed without using bedrails?: None Help needed moving to and from a bed to a chair (including a wheelchair)?: A Little Help needed standing up from a chair using your arms (e.g., wheelchair or bedside chair)?: A Little Help needed to walk in hospital room?: A Little Help needed climbing 3-5 steps with a railing? : A Lot 6 Click Score: 19    End of Session Equipment Utilized During Treatment: Gait belt Activity Tolerance: Patient limited by fatigue Patient left: in bed;with call bell/phone within reach;with bed alarm set Nurse Communication: Mobility status PT Visit Diagnosis: History of falling (Z91.81);Muscle weakness (generalized) (M62.81);Unsteadiness on feet (R26.81)     Time: 1211-1222 PT Time Calculation (min) (ACUTE ONLY): 11 min  Charges:  $Gait Training: 8-22 mins                     Coolidge Breeze, PT, DPT [facility] Department Office: [phone] Pager: [phone]   Coolidge Breeze 10/20/2021, 12:36 PM

## 2021-10-21 DIAGNOSIS — E871 Hypo-osmolality and hyponatremia: Secondary | ICD-10-CM | POA: Diagnosis not present

## 2021-10-21 DIAGNOSIS — R9431 Abnormal electrocardiogram [ECG] [EKG]: Secondary | ICD-10-CM | POA: Diagnosis not present

## 2021-10-21 DIAGNOSIS — N179 Acute kidney failure, unspecified: Secondary | ICD-10-CM | POA: Diagnosis not present

## 2021-10-21 DIAGNOSIS — R197 Diarrhea, unspecified: Secondary | ICD-10-CM | POA: Diagnosis not present

## 2021-10-21 LAB — CBC WITH DIFFERENTIAL/PLATELET
Abs Immature Granulocytes: 0.7 10*3/uL — ABNORMAL HIGH (ref 0.00–0.07)
Basophils Absolute: 0.1 10*3/uL (ref 0.0–0.1)
Basophils Relative: 0 %
Eosinophils Absolute: 0 10*3/uL (ref 0.0–0.5)
Eosinophils Relative: 0 %
HCT: 36.4 % (ref 36.0–46.0)
Hemoglobin: 12.2 g/dL (ref 12.0–15.0)
Immature Granulocytes: 3 %
Lymphocytes Relative: 3 %
Lymphs Abs: 0.7 10*3/uL (ref 0.7–4.0)
MCH: 29.3 pg (ref 26.0–34.0)
MCHC: 33.5 g/dL (ref 30.0–36.0)
MCV: 87.5 fL (ref 80.0–100.0)
Monocytes Absolute: 0.8 10*3/uL (ref 0.1–1.0)
Monocytes Relative: 3 %
Neutro Abs: 22.4 10*3/uL — ABNORMAL HIGH (ref 1.7–7.7)
Neutrophils Relative %: 91 %
Platelets: 208 10*3/uL (ref 150–400)
RBC: 4.16 MIL/uL (ref 3.87–5.11)
RDW: 16.4 % — ABNORMAL HIGH (ref 11.5–15.5)
WBC: 24.6 10*3/uL — ABNORMAL HIGH (ref 4.0–10.5)
nRBC: 0 % (ref 0.0–0.2)

## 2021-10-21 LAB — MAGNESIUM: Magnesium: 2 mg/dL (ref 1.7–2.4)

## 2021-10-21 LAB — BASIC METABOLIC PANEL
Anion gap: 8 (ref 5–15)
BUN: 18 mg/dL (ref 8–23)
CO2: 26 mmol/L (ref 22–32)
Calcium: 9.3 mg/dL (ref 8.9–10.3)
Chloride: 101 mmol/L (ref 98–111)
Creatinine, Ser: 0.53 mg/dL (ref 0.44–1.00)
GFR, Estimated: >60 mL/min (ref >60)
Glucose, Bld: 166 mg/dL — ABNORMAL HIGH (ref 70–99)
Potassium: 3.5 mmol/L (ref 3.5–5.1)
Sodium: 135 mmol/L (ref 135–145)

## 2021-10-21 MED ORDER — THIAMINE HCL 100 MG PO TABS
100.0000 mg | ORAL_TABLET | Freq: Every day | ORAL | Status: DC
  Administered 2021-10-22: 100 mg via ORAL
  Filled 2021-10-21: qty 1

## 2021-10-21 NOTE — Progress Notes (Signed)
[facility] Progress Note  [name] 66 y.o. Sep 03, 1955  CC: Intractable nausea and vomiting   Subjective: Patient states she continues to have nausea today, no episodes of vomiting.  Denies abdominal pain.  Has not had a bowel movement.  Tolerating clear liquids.  ROS : Review of Systems  Constitutional:  Negative for chills, fever and weight loss.  Gastrointestinal:  Positive for nausea. Negative for abdominal pain, blood in stool, constipation, diarrhea, heartburn, melena and vomiting.      Objective: Vital signs in last 24 hours: Vitals:   10/20/21 1915 10/21/21 0602  BP: (!) 147/86 (!) 149/90  Pulse: 82 92  Resp: 19 18  Temp: 98 F (36.7 C) 98 F (36.7 C)  SpO2: 94% 94%    Physical Exam:  General:  Alert, cooperative, no distress, appears stated age  Head:  Normocephalic, without obvious abnormality, atraumatic  Eyes:  Anicteric sclera, EOM's intact  Lungs:   Clear to auscultation bilaterally, respirations unlabored  Heart:  Regular rate and rhythm, S1, S2 normal  Abdomen:   Soft, non-tender, bowel sounds active all four quadrants,  no masses,     Lab Results: Recent Labs    10/20/21 0535 10/21/21 0544  NA 134* 135  K 3.6 3.5  CL 103 101  CO2 23 26  GLUCOSE 154* 166*  BUN 16 18  CREATININE 0.48 0.53  CALCIUM 9.2 9.3  MG 1.8 2.0   Recent Labs    10/20/21 0535  AST 29  ALT 21  ALKPHOS 122  BILITOT 0.6  PROT 6.6  ALBUMIN 3.2*   Recent Labs    10/20/21 0535 10/21/21 0544  WBC 36.2* 24.6*  NEUTROABS 33.5* 22.4*  HGB 12.0 12.2  HCT 35.0* 36.4  MCV 86.4 87.5  PLT 238 208   No results for input(s): "LABPROT", "INR" in the last 72 hours.    Assessment Intractable nausea vomiting -CT abdomen pelvis with contrast 7/9: No acute intra-abdominal process, chronic bilateral sacral fractures.  Mild sigmoid diverticulosis -GI pathogen panel positive for enterotoxigenic E. Coli - improving leukocytosis with WBC 24.6  (36.2) -Normal  renal function -EGD 7/12: Food in the middle third of the esophagus (removed), white coated esophagus with some erosive esophagitis with no bleeding.  Benign-appearing esophageal stenosis.  Small amount of food in the stomach.  Normal duodenum.  Small hiatal hernia.  Biopsies pending   Plan: Improving leukocytosis on antibiotics for possible UTI.  Blood cultures pending. Continuing to have nausea without clear etiology.  Would benefit from gastric emptying study for further evaluation Patient does not want to advance diet at this time, can continue clears. Continue supportive care [facility] will follow Garnette Scheuermann PA-C 10/21/2021, 10:28 AM  Contact #  [phone]

## 2021-10-21 NOTE — Progress Notes (Signed)
PHARMACIST - PHYSICIAN COMMUNICATION  DR:   Starla Link  CONCERNING: IV to Oral Route Change Policy  RECOMMENDATION: This patient is receiving thiamine by the intravenous route.  Based on criteria approved by the Pharmacy and Therapeutics Committee, the intravenous medication(s) is/are being converted to the equivalent oral dose form(s).   DESCRIPTION: These criteria include: The patient is eating (either orally or via tube) and/or has been taking other orally administered medications for a least 24 hours The patient has no evidence of active gastrointestinal bleeding or impaired GI absorption (gastrectomy, short bowel, patient on TNA or NPO).  If you have questions about this conversion, please contact the Pharmacy Department  '[]'$   [phone] )  [facility] '[]'$   [phone] )  [facility] '[]'$   [phone] )  [facility] '[]'$   [phone] )  [facility] '[x]'$   [phone] )  [place], PharmD, BCPS 10/21/2021 10:27 AM

## 2021-10-21 NOTE — Progress Notes (Signed)
[name] 10:33 AM  Subjective: Patient actually doing better today from a pain standpoint and she was seen and examined and her nausea is unchanged but she is going to try full liquids and no new complaints  Objective: Vital signs stable afebrile no acute distress abdomen is softer nontender today chemistries okay White count decreased but still significant elevation  Assessment: Multiple medical complaints  Plan: Would get a gastric emptying study prior to discharge or set up as an outpatient but I think her meds need to be adjusted because if she still has significant gastric emptying which I suspect she would need Reglan or erythromycin both of which have other side effects and hopefully she will have a close follow-up with her psychiatrist and here she will review this note since the consult psych note did not address the true problem and I do not think we thought she needed inpatient psych care and if white count continues to rise consider repeat CT and please call my partner this weekend if any other GI question or problem  [facility]  office [phone] After 5PM or if no answer call [phone]

## 2021-10-21 NOTE — Plan of Care (Signed)

## 2021-10-21 NOTE — Progress Notes (Signed)
PT Cancellation Note  Patient Details Name: [name] MRN: [id number] DOB: 1955-05-21   Cancelled Treatment:    Reason Eval/Treat Not Completed: (P) Patient declined, no reason specified. Pt refused x2 citing nausea and pain the first refusal and pt had just walked with NT prior to PT arrival. Pt is a good candidate for mobility specialist as ambulation is important to her recovery and does not require additional skilled acute physical therapy, PT needs can be met at next venue of care. PT is signing off, if needs change please re-consult.  Coolidge Breeze, PT, DPT [facility] Office: [phone] Pager: [phone]  Coolidge Breeze 10/21/2021, 3:31 PM

## 2021-10-21 NOTE — Progress Notes (Signed)
PROGRESS NOTE    Joyce FOSKETT  HEN:[id number] DOB: [birth year] DOA: 10/16/2021 PCP: Bartholome Bill, MD   Brief Narrative:  66 year old lady with history of PTSD, schizoaffective disorder, depression, IBS, chronic pancreatitis, gastroparesis, tobacco use presented with worsening nausea, vomiting, diarrhea and abdominal cramping for the past month.  She apparently was admitted to the hospital for the same symptoms 3 weeks ago, was seen by GI and felt to be IBS-D.  Improved with supportive treatment and discharged home, however her symptoms recurred.  She was admitted again and started on IV fluids.  GI was consulted.  She underwent EGD on 10/19/2021  Assessment & Plan:   Intractable nausea and vomiting -GI following.  Status post EGD on 10/19/2021 showed food in the middle third of esophagus which was removed along with some erosive esophagitis with no bleeding; benign appearing esophageal stenosis with normal duodenum.  GI recommended to decrease narcotics and to adjust her psych medications to possibly help with improving motility and stomach emptying.  Patient might need to have a repeat nuclear emptying study.   -Currently on colestipol and dicyclomine since possibly has a component of IBS-D as well.  GI PCR was positive for enterotoxigenic E. coli.  Currently not having diarrhea. -Still not feeling well.  Intermittently nauseous.  Diet advancement as per GI.  Possible UTI Leukocytosis -WBCs had worsened to 36.2 on 10/20/2021.  Blood cultures pending.  Chest x-ray showed possible left lower lobe atelectasis: Patient does not have any cough or symptoms of pneumonia.  UA suggestive of UTI.  Started on Rocephin on 10/20/2020.  WBC is improving to 24.6 today.  Monitor WBCs in a.m.  Hyponatremia -Improved  hypokalemia -Resolved.  Acute kidney injury -Resolved with IV fluids  QT prolongation -Avoid QT prolonging medications.  QTc was 493 on 10/20/2021.  Repeat EKG  today.  Schizoaffective disorder/bipolar disorder -[place] resumed by psychiatry on 10/20/2021.  Psychiatry consult appreciated. Psychiatry signed off on 10/20/2021.  Outpatient follow-up with psychiatry.  Mild normocytic anemia -Resolved.  Hemoglobin stable with  DVT prophylaxis: SCDs Code Status: Full Family Communication: [name] at bedside Disposition Plan: Status is: Inpatient Remains inpatient appropriate because: Of severity of illness.  Consultants: GI/psychiatry  Procedures: EGD on 10/19/2021 Impression:               - Food in the middle third of the esophagus.                            Removal was successful.                           - A white coated esophagus with some erosive                            esophagitis with no bleeding. Biopsied.                           - Benign-appearing esophageal stenosis.                           - A small amount of food (residue) in the stomach.                           - Normal duodenal bulb, first portion  of the                            duodenum, second portion of the duodenum and third                            portion of the duodenum.                           - The examination was otherwise normal.                           - Small hiatal hernia. Moderate Sedation:      Not Applicable - Patient had care per Anesthesia. Recommendation:           - Clear liquid diet today. Might require repeat                            nuclear emptying study                           - Continue present medications. I think decreasing                            narcotics and readjusting her psych medicines to                            possibly help with improving motility and stomach                            emptying would be helpful- Await pathology results.                           - Return to GI clinic PRN. We will check on tomorrow                           - Telephone GI clinic for pathology results in 1                             week.                           - Telephone GI clinic if symptomatic PRN.  Antimicrobials: [name]   Subjective: Patient seen and examined at bedside.  Poor historian.  Still complains of intermittent abdominal pain with nausea.  She asks me "when are they going to do surgery?".  No fever, chest pain reported. Objective: Vitals:   10/20/21 0506 10/20/21 1355 10/20/21 1915 10/21/21 0602  BP: (!) 154/96 (!) 145/92 (!) 147/86 (!) 149/90  Pulse: 83 86 82 92  Resp: '18 16 19 18  '$ Temp: 98.4 F (36.9 C) 97.6 F (36.4 C) 98 F (36.7 C) 98 F (36.7 C)  TempSrc:  Oral  Oral  SpO2: 92% 92% 94% 94%  Weight:      Height:        Intake/Output Summary (Last 24 hours) at 10/21/2021 0757 Last data filed at 10/20/2021 2233 Gross per 24  hour  Intake 720 ml  Output 480 ml  Net 240 ml    Filed Weights   10/17/21 0120 10/19/21 1056  Weight: 65 kg 65 kg    Examination:  General: No acute distress.  Still on room air.  Looks chronically ill and deconditioned. ENT/neck: No palpable neck masses or JVD elevation  respiratory: Bilateral decreased breath sounds at bases with scattered crackles CVS: Rate mostly controlled; S1 and S2 are heard Abdominal: Soft, nontender, still distended; no organomegaly, bowel sounds are heard  extremities: No clubbing; mild lower extremity edema present CNS: Alert; still slow to respond.  Poor historian.  No focal neurologic deficit.  Moves extremities Lymph: No palpable lymphadenopathy noted skin: No obvious rashes/petechiae psych: Not agitated currently.  Very flat affect. Musculoskeletal: No obvious joint swelling/deformity   Data Reviewed: I have personally reviewed following labs and imaging studies  CBC: Recent Labs  Lab 10/16/21 1042 10/17/21 0535 10/18/21 0536 10/19/21 0621 10/20/21 0535 10/21/21 0544  WBC 13.6* 10.7* 9.4 27.4* 36.2* 24.6*  NEUTROABS 11.6*  --   --   --  33.5* 22.4*  HGB 12.8 10.4* 11.3* 12.7 12.0 12.2  HCT 35.6* 30.1* 33.6*  36.9 35.0* 36.4  MCV 81.8 84.8 88.0 86.6 86.4 87.5  PLT 372 246 248 252 238 233    Basic Metabolic Panel: Recent Labs  Lab 10/16/21 2319 10/17/21 0535 10/17/21 1404 10/17/21 2119 10/18/21 0536 10/19/21 0621 10/20/21 0535 10/21/21 0544  NA  --  129*   < > 131* 135 134* 134* 135  K  --  3.5   < > 3.1* 3.0* 4.0 3.6 3.5  CL  --  99   < > 104 105 103 103 101  CO2  --  19*   < > 20* '24 22 23 26  '$ GLUCOSE  --  90   < > 107* 119* 112* 154* 166*  BUN  --  16   < > 9 7* '10 16 18  '$ CREATININE  --  1.16*   < > 0.82 0.62 0.50 0.48 0.53  CALCIUM  --  8.8*   < > 8.7* 9.0 9.0 9.2 9.3  MG 1.8 1.8  --   --  1.9  --  1.8 2.0  PHOS 3.5 3.7  --   --   --   --   --   --    < > = values in this interval not displayed.    GFR: Estimated Creatinine Clearance: 62.1 mL/min (by C-G formula based on SCr of 0.53 mg/dL). Liver Function Tests: Recent Labs  Lab 10/16/21 1042 10/16/21 2319 10/17/21 0535 10/18/21 0536 10/20/21 0535  AST 65* 50* 42* 29 29  ALT '28 25 22 21 21  '$ ALKPHOS 153* 131* 125 122 122  BILITOT 1.7* 1.3* 1.0 0.5 0.6  PROT 7.7 6.6 5.9* 6.3* 6.6  ALBUMIN 4.3 3.6 3.3* 3.5 3.2*    Recent Labs  Lab 10/16/21 1042  LIPASE 19    No results for input(s): "AMMONIA" in the last 168 hours. Coagulation Profile: No results for input(s): "INR", "PROTIME" in the last 168 hours. Cardiac Enzymes: Recent Labs  Lab 10/16/21 2319 10/17/21 0535  CKTOTAL 498* 374*    BNP (last 3 results) No results for input(s): "PROBNP" in the last 8760 hours. HbA1C: No results for input(s): "HGBA1C" in the last 72 hours. CBG: Recent Labs  Lab 10/16/21 2147  GLUCAP 103*    Lipid Profile: No results for input(s): "CHOL", "HDL", "LDLCALC", "TRIG", "CHOLHDL", "LDLDIRECT"  in the last 72 hours. Thyroid Function Tests: No results for input(s): "TSH", "T4TOTAL", "FREET4", "T3FREE", "THYROIDAB" in the last 72 hours. Anemia Panel: No results for input(s): "VITAMINB12", "FOLATE", "FERRITIN", "TIBC",  "IRON", "RETICCTPCT" in the last 72 hours. Sepsis Labs: Recent Labs  Lab 10/16/21 2230  LATICACIDVEN 0.5     Recent Results (from the past 240 hour(s))  Gastrointestinal Panel by PCR , Stool     Status: Abnormal   Collection Time: 10/16/21  9:37 PM   Specimen: Stool  Result Value Ref Range Status   Campylobacter species NOT DETECTED NOT DETECTED Final   Plesimonas shigelloides NOT DETECTED NOT DETECTED Final   Salmonella species NOT DETECTED NOT DETECTED Final   Yersinia enterocolitica NOT DETECTED NOT DETECTED Final   Vibrio species NOT DETECTED NOT DETECTED Final   Vibrio cholerae NOT DETECTED NOT DETECTED Final   Enteroaggregative E coli (EAEC) NOT DETECTED NOT DETECTED Final   Enteropathogenic E coli (EPEC) NOT DETECTED NOT DETECTED Final   Enterotoxigenic E coli (ETEC) DETECTED (A) NOT DETECTED Final    Comment: RESULT CALLED TO, READ BACK BY AND VERIFIED WITH: KRISTIN LANGLEY ON 10/17/21 AT 1840 QSD    Shiga like toxin producing E coli (STEC) NOT DETECTED NOT DETECTED Final   Shigella/Enteroinvasive E coli (EIEC) NOT DETECTED NOT DETECTED Final   Cryptosporidium NOT DETECTED NOT DETECTED Final   Cyclospora cayetanensis NOT DETECTED NOT DETECTED Final   Entamoeba histolytica NOT DETECTED NOT DETECTED Final   Giardia lamblia NOT DETECTED NOT DETECTED Final   Adenovirus F40/41 NOT DETECTED NOT DETECTED Final   Astrovirus NOT DETECTED NOT DETECTED Final   Norovirus GI/GII NOT DETECTED NOT DETECTED Final   Rotavirus A NOT DETECTED NOT DETECTED Final   Sapovirus (I, II, IV, and V) NOT DETECTED NOT DETECTED Final    Comment: Performed at [facility], [address] [place], [place] [address]  Culture, blood (Routine X 2) w Reflex to ID Panel     Status: [name] (Preliminary result)   Collection Time: 10/20/21 10:48 AM   Specimen: BLOOD RIGHT HAND  Result Value Ref Range Status   Specimen Description   Final    BLOOD RIGHT HAND Performed at [facility], [place] [address] [place], [place] [address]    Special Requests   Final    IN PEDIATRIC BOTTLE Blood Culture adequate volume Performed at [facility] [address], [place], [place] [address]    Culture   Final    NO GROWTH < 24 HOURS Performed at [facility] [address], [place], [place] [address]    Report Status PENDING  Incomplete  Culture, blood (Routine X 2) w Reflex to ID Panel     Status: [name] (Preliminary result)   Collection Time: 10/20/21 10:48 AM   Specimen: BLOOD  Result Value Ref Range Status   Specimen Description   Final    BLOOD RIGHT ANTECUBITAL Performed at [facility] [address] [place], [place] [address]    Special Requests   Final    IN PEDIATRIC BOTTLE Blood Culture adequate volume Performed at [facility] [address], [place], [place] [address]    Culture   Final    NO GROWTH < 24 HOURS Performed at [facility] [address], [place], [place] [address]    Report Status PENDING  Incomplete         Radiology Studies: DG ABD ACUTE 2+V  W 1V CHEST  Result Date: 10/20/2021 CLINICAL DATA:  Upper endoscopy yesterday 10/19/2021. Abdominal pain. EXAM: DG ABDOMEN ACUTE WITH 1 VIEW CHEST COMPARISON:  AP chest 10/16/2021; CT abdomen and pelvis 10/16/2021 FINDINGS: Cardiac silhouette is at the upper limits of normal. Mediastinal contours are within normal limits with moderate calcification within the aortic arch. Mild bilateral mid and lower lung interstitial thickening. New mild left basilar airspace opacity and possible pleural fluid. No pneumothorax. Mild dextrocurvature of the thoracic spine. Moderate multilevel degenerative disc changes. Air is seen within nondistended loops of small and large bowel. Mild-to-moderate air-fluid level within the stomach. No portal venous gas or pneumatosis. No subdiaphragmatic free air is seen. Splenic arterial vascular  calcifications. Cholecystectomy clips. Mild-to-moderate multilevel degenerative disc changes of the lumbar spine trauma greatest at L3-4. IMPRESSION: 1. Nonobstructed bowel-gas pattern. 2. Mild left lower lung opacification may reflect atelectasis, pneumonia, and/or pleural fluid. Electronically Signed   By: Yvonne Kendall M.D.   On: 10/20/2021 13:22        Scheduled Meds:  acidophilus  2 capsule Oral Daily   benztropine  1 mg Oral Daily   colestipol  1 g Oral BID   dicyclomine  10 mg Oral TID AC & HS   feeding supplement  1 Container Oral TID BM   gabapentin  300 mg Oral QHS   magnesium gluconate  500 mg Oral BID   multivitamin with minerals  1 tablet Oral Daily   nicotine  14 mg Transdermal Daily   pantoprazole  40 mg Oral BID AC   pneumococcal 20-valent conjugate vaccine  0.5 mL Intramuscular Tomorrow-1000   sertraline  100 mg Oral BID   sucralfate  1 g Oral QID   thiamine injection  100 mg Intravenous Q24H   traZODone  200 mg Oral QHS   ziprasidone  80 mg Oral BID WC   Continuous Infusions:  cefTRIAXone (ROCEPHIN)  IV 1 g (10/20/21 1856)           Aline August, MD [facility] 10/21/2021, 7:57 AM

## 2021-10-21 NOTE — Progress Notes (Signed)
Occupational Therapy Treatment Patient Details Name: [name] MRN: [id number] DOB: 04-29-1955 Today's Date: 10/21/2021   History of present illness 66 yo female admitted with hyponatremia, weakness. Pt reports falls at home prior to admission. Hx of L hip IM nail, PTSD, schizophrenia, recurrent N/V/D, pancreatitis   OT comments  Patient overall has improved to min guard to supervision assist. She still complains of stomach pain but overall doesn't appear to be in too much discomfort. She was able to perform toileting and stand at sink to wash face. She is mildly unsteady but had no overt loss of balance. Encouraged patient to sit up in chair and stay out of bed to maintain her strength. She verbalized understanding. Will continue to follow.    Recommendations for follow up therapy are one component of a multi-disciplinary discharge planning process, led by the attending physician.  Recommendations may be updated based on patient status, additional functional criteria and insurance authorization.    Follow Up Recommendations  Home health OT    Assistance Recommended at Discharge Frequent or constant Supervision/Assistance  Patient can return home with the following  A little help with bathing/dressing/bathroom;Assistance with cooking/housework;A little help with walking and/or transfers   Equipment Recommendations  None recommended by OT    Recommendations for Other Services      Precautions / Restrictions Precautions Precautions: Fall Precaution Comments: monitor BP Restrictions Weight Bearing Restrictions: No       Mobility Bed Mobility Overal bed mobility: Needs Assistance Bed Mobility: Supine to Sit     Supine to sit: Min assist     General bed mobility comments: Required min assist for trunk to sit up    Transfers Overall transfer level: Needs assistance Equipment used: Rolling walker (2 wheels) Transfers: Sit to/from Stand Sit to Stand: Min guard            General transfer comment: min guard for safety.     Balance Overall balance assessment: Mild deficits observed, not formally tested                                         ADL either performed or assessed with clinical judgement   ADL Overall ADL's : Needs assistance/impaired                         Toilet Transfer: Rolling walker (2 wheels);Regular Toilet;Grab bars;Min guard Toilet Transfer Details (indicate cue type and reason): min guard - cue to use grab bar Toileting- Clothing Manipulation and Hygiene: Supervision/safety;Sit to/from stand Toileting - Clothing Manipulation Details (indicate cue type and reason): able to manage clothing     Functional mobility during ADLs: Min guard;Rolling walker (2 wheels) General ADL Comments: min guard to ambulate to bathroom and transfer on to toilet. No overt loss of balance but mildly unsteady. Required walker    Extremity/Trunk Assessment Upper Extremity Assessment Upper Extremity Assessment: Overall WFL for tasks assessed   Lower Extremity Assessment Lower Extremity Assessment: Defer to PT evaluation   Cervical / Trunk Assessment Cervical / Trunk Assessment: Normal    Vision Patient Visual Report: No change from baseline     Perception     Praxis      Cognition Arousal/Alertness: Awake/alert Behavior During Therapy: WFL for tasks assessed/performed, Flat affect Overall Cognitive Status: Within Functional Limits for tasks assessed  Exercises      Shoulder Instructions       General Comments      Pertinent Vitals/ Pain       Pain Assessment Pain Assessment: PAINAD Breathing: normal Negative Vocalization: occasional moan/groan, low speech, negative/disapproving quality Facial Expression: smiling or inexpressive Body Language: relaxed Consolability: no need to console PAINAD Score: 1 Pain Location: stomach Pain  Descriptors / Indicators: Discomfort Pain Intervention(s): Monitored during session  Home Living                                          Prior Functioning/Environment              Frequency  Min 2X/week        Progress Toward Goals  OT Goals(current goals can now be found in the care plan section)  Progress towards OT goals: Progressing toward goals  Acute Rehab OT Goals Patient Stated Goal: get home to dog OT Goal Formulation: With patient Time For Goal Achievement: 10/31/21 Potential to Achieve Goals: Good  Plan      Co-evaluation                 AM-PAC OT "6 Clicks" Daily Activity     Outcome Measure   Help from another person eating meals?: A Little Help from another person taking care of personal grooming?: A Little Help from another person toileting, which includes using toliet, bedpan, or urinal?: A Little Help from another person bathing (including washing, rinsing, drying)?: A Little Help from another person to put on and taking off regular upper body clothing?: A Little Help from another person to put on and taking off regular lower body clothing?: A Little 6 Click Score: 18    End of Session Equipment Utilized During Treatment: Rolling walker (2 wheels)  OT Visit Diagnosis: Unsteadiness on feet (R26.81);Muscle weakness (generalized) (M62.81)   Activity Tolerance Patient tolerated treatment well   Patient Left in chair;with call bell/phone within reach;with chair alarm set   Nurse Communication  (okay to see)        Time: 6333-5456 OT Time Calculation (min): 15 min  Charges: OT General Charges $OT Visit: 1 Visit OT Treatments $Self Care/Home Management : 8-22 mins  Derl Barrow, OTR/L [place]  Office [phone] Pager: [place] 10/21/2021, 9:08 AM

## 2021-10-21 NOTE — Care Management Important Message (Signed)
Important Message  Patient Details  Name: [name] MRN: [id number] Date of Birth: Jul 13, 1955   Medicare Important Message Given:  Yes     Memory Argue 10/21/2021, 1:38 PM

## 2021-10-22 ENCOUNTER — Other Ambulatory Visit (HOSPITAL_COMMUNITY): Payer: Self-pay

## 2021-10-22 DIAGNOSIS — R197 Diarrhea, unspecified: Secondary | ICD-10-CM | POA: Diagnosis not present

## 2021-10-22 DIAGNOSIS — N179 Acute kidney failure, unspecified: Secondary | ICD-10-CM | POA: Diagnosis not present

## 2021-10-22 DIAGNOSIS — E860 Dehydration: Secondary | ICD-10-CM | POA: Diagnosis not present

## 2021-10-22 DIAGNOSIS — E871 Hypo-osmolality and hyponatremia: Secondary | ICD-10-CM | POA: Diagnosis not present

## 2021-10-22 LAB — BASIC METABOLIC PANEL
Anion gap: 8 (ref 5–15)
BUN: 15 mg/dL (ref 8–23)
CO2: 25 mmol/L (ref 22–32)
Calcium: 8.8 mg/dL — ABNORMAL LOW (ref 8.9–10.3)
Chloride: 101 mmol/L (ref 98–111)
Creatinine, Ser: 0.48 mg/dL (ref 0.44–1.00)
GFR, Estimated: >60 mL/min (ref >60)
Glucose, Bld: 135 mg/dL — ABNORMAL HIGH (ref 70–99)
Potassium: 3.2 mmol/L — ABNORMAL LOW (ref 3.5–5.1)
Sodium: 134 mmol/L — ABNORMAL LOW (ref 135–145)

## 2021-10-22 LAB — CBC WITH DIFFERENTIAL/PLATELET
Abs Immature Granulocytes: 0.41 10*3/uL — ABNORMAL HIGH (ref 0.00–0.07)
Basophils Absolute: 0 10*3/uL (ref 0.0–0.1)
Basophils Relative: 0 %
Eosinophils Absolute: 0.2 10*3/uL (ref 0.0–0.5)
Eosinophils Relative: 1 %
HCT: 35.3 % — ABNORMAL LOW (ref 36.0–46.0)
Hemoglobin: 11.8 g/dL — ABNORMAL LOW (ref 12.0–15.0)
Immature Granulocytes: 2 %
Lymphocytes Relative: 5 %
Lymphs Abs: 0.9 10*3/uL (ref 0.7–4.0)
MCH: 29.6 pg (ref 26.0–34.0)
MCHC: 33.4 g/dL (ref 30.0–36.0)
MCV: 88.5 fL (ref 80.0–100.0)
Monocytes Absolute: 0.7 10*3/uL (ref 0.1–1.0)
Monocytes Relative: 4 %
Neutro Abs: 15.4 10*3/uL — ABNORMAL HIGH (ref 1.7–7.7)
Neutrophils Relative %: 88 %
Platelets: 189 10*3/uL (ref 150–400)
RBC: 3.99 MIL/uL (ref 3.87–5.11)
RDW: 16.4 % — ABNORMAL HIGH (ref 11.5–15.5)
WBC: 17.7 10*3/uL — ABNORMAL HIGH (ref 4.0–10.5)
nRBC: 0 % (ref 0.0–0.2)

## 2021-10-22 LAB — URINE CULTURE: Culture: >=100000 — AB

## 2021-10-22 LAB — MAGNESIUM: Magnesium: 2.1 mg/dL (ref 1.7–2.4)

## 2021-10-22 MED ORDER — POTASSIUM CHLORIDE CRYS ER 20 MEQ PO TBCR
40.0000 meq | EXTENDED_RELEASE_TABLET | Freq: Once | ORAL | Status: AC
  Administered 2021-10-22: 40 meq via ORAL
  Filled 2021-10-22: qty 2

## 2021-10-22 MED ORDER — CEPHALEXIN 500 MG PO CAPS: 500.0000 mg | ORAL_CAPSULE | Freq: Three times a day (TID) | ORAL | 0 refills | Status: AC

## 2021-10-22 MED ORDER — CEPHALEXIN 500 MG PO CAPS
500.0000 mg | ORAL_CAPSULE | Freq: Three times a day (TID) | ORAL | 0 refills | Status: DC
  Filled 2021-10-22: qty 15, 5d supply, fill #0

## 2021-10-22 MED ORDER — DICYCLOMINE HCL 20 MG PO TABS: 10.0000 mg | ORAL_TABLET | Freq: Three times a day (TID) | ORAL | Status: DC

## 2021-10-22 MED ORDER — SERTRALINE HCL 100 MG PO TABS: 100.0000 mg | ORAL_TABLET | Freq: Two times a day (BID) | ORAL | 0 refills | Status: DC

## 2021-10-22 NOTE — Plan of Care (Signed)

## 2021-10-22 NOTE — Progress Notes (Signed)
This RN called pt's emergency contact left vm.

## 2021-10-22 NOTE — Discharge Summary (Signed)
Physician Discharge Summary  [name] HWE:[id number] DOB: 1956/04/03 DOA: 10/16/2021  PCP: Bartholome Bill, MD  Admit date: 10/16/2021 Discharge date: 10/22/2021  Admitted From: Home Disposition: Home  Recommendations for Outpatient Follow-up:  Follow up with PCP in 1 week with repeat CBC/BMP Follow up in ED if symptoms worsen or new appear   Home Health: No Equipment/Devices: None  Discharge Condition: Stable CODE STATUS: Full Diet recommendation: Heart healthy/soft diet  Brief/Interim Summary: 66 year old lady with history of PTSD, schizoaffective disorder, depression, IBS, chronic pancreatitis, gastroparesis, tobacco use presented with worsening nausea, vomiting, diarrhea and abdominal cramping for the past month.  She apparently was admitted to the hospital for the same symptoms 3 weeks ago, was seen by GI and felt to be IBS-D.  Improved with supportive treatment and discharged home, however her symptoms recurred.  She was admitted again and started on IV fluids.  GI was consulted.  She underwent EGD on 10/19/2021.  Psychiatry has resumed Geodon.  She was found to have possible UTI with worsening leukocytosis and was started on IV Rocephin; subsequently, her WBCs are improving and she is tolerating diet.  Urine culture grew Klebsiella pneumoniae.  She wants to go home today.  She will be discharged home today if she tolerates soft diet.  Outpatient follow-up with GI and psychiatry.    Discharge Diagnoses:   Intractable nausea and vomiting -GI following.  Status post EGD on 10/19/2021 showed food in the middle third of esophagus which was removed along with some erosive esophagitis with no bleeding; benign appearing esophageal stenosis with normal duodenum.  GI recommended to decrease narcotics and to adjust her psych medications to possibly help with improving motility and stomach emptying.  Patient might need to have a repeat nuclear emptying study.   -Currently on colestipol  and dicyclomine since possibly has a component of IBS-D as well.  GI PCR was positive for enterotoxigenic E. coli.  Currently not having diarrhea. -Feeling much better and wants to go home today.  Will advance diet to soft diet today.  If tolerates diet, will discharge home today.  Outpatient follow-up with GI.  Patient needs to be compliant with her medications.    Possible UTI Leukocytosis -WBCs had worsened to 36.2 on 10/20/2021.  Blood cultures negative so far.  Chest x-ray showed possible left lower lobe atelectasis: Patient does not have any cough or symptoms of pneumonia.  UA suggestive of UTI.  Started on Rocephin on 10/20/2020.  WBC is improving to 17.7 today.  Urine cultures grew Klebsiella pneumoniae.  She wants to go home today.  She will be discharged home on oral Keflex for 5 more days.   Hyponatremia -Mild.  Outpatient follow-up.  hypokalemia -Replace prior to discharge.   Acute kidney injury -Resolved with IV fluids   QT prolongation -Avoid QT prolonging medications.  QTc 511 today.  Outpatient follow-up.    Schizoaffective disorder/bipolar disorder -Geodon resumed by psychiatry on 10/20/2021.  Psychiatry consult appreciated. Psychiatry signed off on 10/20/2021.  Outpatient follow-up with psychiatry.   Mild normocytic anemia -Resolved.  Hemoglobin stable with  Discharge Instructions  Discharge Instructions     Diet - low sodium heart healthy   Complete by: As directed    Soft diet   Increase activity slowly   Complete by: As directed       Allergies as of 10/22/2021       Reactions   Penicillins Rash      Sulfa Antibiotics Rash  Medication List     STOP taking these medications    ciprofloxacin 500 MG tablet Commonly known as: CIPRO   lisinopril-hydrochlorothiazide 20-12.5 MG tablet Commonly known as: ZESTORETIC   oxycodone 5 MG capsule Commonly known as: OXY-IR       TAKE these medications    acidophilus Caps capsule Take 2  capsules by mouth daily.   benztropine 1 MG tablet Commonly known as: COGENTIN Take 1 tablet (1 mg total) by mouth daily.   cephALEXin 500 MG capsule Commonly known as: KEFLEX Take 1 capsule (500 mg total) by mouth 3 (three) times daily for 5 days.   colestipol 1 g tablet Commonly known as: COLESTID Take 1 tablet (1 g total) by mouth 2 (two) times daily.   cyclobenzaprine 5 MG tablet Commonly known as: FLEXERIL Take 5 mg by mouth 3 (three) times daily as needed for muscle spasms.   dicyclomine 20 MG tablet Commonly known as: BENTYL Take 0.5 tablets (10 mg total) by mouth 3 (three) times daily before meals. What changed: how much to take   gabapentin 300 MG capsule Commonly known as: NEURONTIN Take 300 mg by mouth at bedtime.   lisinopril 40 MG tablet Commonly known as: ZESTRIL Take 1 tablet (40 mg total) by mouth daily.   loperamide 2 MG capsule Commonly known as: IMODIUM Take 2 capsules (4 mg total) by mouth as needed for diarrhea or loose stools.   magnesium gluconate 500 MG tablet Commonly known as: MAGONATE Take 500 mg by mouth 2 (two) times daily.   meclizine 25 MG tablet Commonly known as: ANTIVERT Take 25 mg by mouth daily as needed for dizziness.   omeprazole 40 MG capsule Commonly known as: PRILOSEC Take 40 mg by mouth in the morning and at bedtime.   ondansetron 4 MG disintegrating tablet Commonly known as: ZOFRAN-ODT Take 4 mg by mouth every 8 (eight) hours as needed for nausea or vomiting.   sertraline 100 MG tablet Commonly known as: ZOLOFT Take 1 tablet (100 mg total) by mouth 2 (two) times daily. What changed:  how much to take how to take this when to take this additional instructions   sucralfate 1 GM/10ML suspension Commonly known as: CARAFATE Take 10 mLs (1 g total) by mouth 4 (four) times daily -  with meals and at bedtime. What changed: when to take this   traZODone 100 MG tablet Commonly known as: DESYREL TAKE 2 TABLETS(200 MG)  BY MOUTH AT BEDTIME AS NEEDED FOR SLEEP What changed:  how much to take how to take this when to take this additional instructions   ziprasidone 80 MG capsule Commonly known as: GEODON Take 1 capsule (80 mg total) by mouth in the morning and at bedtime.        Allergies  Allergen Reactions   Penicillins Rash          Sulfa Antibiotics Rash    Consultations: GI/psychiatry   Procedures/Studies: DG ABD ACUTE 2+V W 1V CHEST  Result Date: 10/20/2021 CLINICAL DATA:  Upper endoscopy yesterday 10/19/2021. Abdominal pain. EXAM: DG ABDOMEN ACUTE WITH 1 VIEW CHEST COMPARISON:  AP chest 10/16/2021; CT abdomen and pelvis 10/16/2021 FINDINGS: Cardiac silhouette is at the upper limits of normal. Mediastinal contours are within normal limits with moderate calcification within the aortic arch. Mild bilateral mid and lower lung interstitial thickening. New mild left basilar airspace opacity and possible pleural fluid. No pneumothorax. Mild dextrocurvature of the thoracic spine. Moderate multilevel degenerative disc changes. Air is seen within  nondistended loops of small and large bowel. Mild-to-moderate air-fluid level within the stomach. No portal venous gas or pneumatosis. No subdiaphragmatic free air is seen. Splenic arterial vascular calcifications. Cholecystectomy clips. Mild-to-moderate multilevel degenerative disc changes of the lumbar spine trauma greatest at L3-4. IMPRESSION: 1. Nonobstructed bowel-gas pattern. 2. Mild left lower lung opacification may reflect atelectasis, pneumonia, and/or pleural fluid. Electronically Signed   By: Yvonne Kendall M.D.   On: 10/20/2021 13:22   DG CHEST PORT 1 VIEW  Result Date: 10/16/2021 CLINICAL DATA:  Hyponatremia.  Upper abdominal pain.  Nausea. EXAM: PORTABLE CHEST 1 VIEW COMPARISON:  09/19/2021. FINDINGS: The heart is normal in size.The cardiomediastinal contours are normal. Mild biapical pleuroparenchymal scarring. Pulmonary vasculature is normal. No  consolidation, pleural effusion, or pneumothorax. No acute osseous abnormalities are seen. IMPRESSION: No acute chest findings. Electronically Signed   By: Keith Rake M.D.   On: 10/16/2021 20:46   CT ABDOMEN PELVIS W CONTRAST  Result Date: 10/16/2021 CLINICAL DATA:  Abdominal pain. EXAM: CT ABDOMEN AND PELVIS WITH CONTRAST TECHNIQUE: Multidetector CT imaging of the abdomen and pelvis was performed using the standard protocol following bolus administration of intravenous contrast. RADIATION DOSE REDUCTION: This exam was performed according to the departmental dose-optimization program which includes automated exposure control, adjustment of the mA and/or kV according to patient size and/or use of iterative reconstruction technique. CONTRAST:  88m OMNIPAQUE IOHEXOL 300 MG/ML  SOLN COMPARISON:  CT abdomen pelvis dated September 18, 2021. FINDINGS: Lower chest: No acute abnormality. 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 Hepatobiliary: No focal liver abnormality is seen. Status post cholecystectomy. No biliary dilatation. Pancreas: Unremarkable. No pancreatic ductal dilatation or surrounding inflammatory changes. Spleen: Normal in size without focal abnormality. Adrenals/Urinary Tract: Adrenal glands are unremarkable. Unchanged 6.5 cm simple cyst in the left kidney. No follow-up imaging is recommended. No renal calculi or hydronephrosis. The bladder is decompressed. Stomach/Bowel: Stomach is within normal limits. Appendix appears normal. No evidence of bowel wall thickening, distention, or inflammatory changes. Mild sigmoid colonic diverticulosis. Vascular/Lymphatic: Aortic atherosclerosis. No enlarged abdominal or pelvic lymph nodes. Reproductive: Uterus and bilateral adnexa are unremarkable. Other: No abdominal wall hernia or abnormality. No abdominopelvic ascites. No pneumoperitoneum. Musculoskeletal: Unchanged subacute to chronic bilateral sacral ala insufficiency fractures. IMPRESSION: 1. No  acute intra-abdominal process. 2. Unchanged subacute to chronic bilateral sacral ala insufficiency fractures. 3. Aortic Atherosclerosis (ICD10-I70.0). Electronically Signed   By: WTitus DubinM.D.   On: 10/16/2021 17:37      Subjective: Patient seen and examined at bedside.  She wants to go home today.  She is tolerating full liquid diet.  No fever, chest pain, shortness of breath reported.  Poor historian.  Discharge Exam: Vitals:   10/21/21 1955 10/22/21 0316  BP:  (!) 146/93  Pulse:  86  Resp:  18  Temp:  98.2 F (36.8 C)  SpO2: 91% 100%    General: Pt is alert, awake, not in acute distress.  Poor historian.  Slow to respond.  Currently on room air. Cardiovascular: rate controlled, S1/S2 + Respiratory: bilateral decreased breath sounds at bases Abdominal: Soft, NT, ND, bowel sounds + Extremities: Trace lower extremity edema; no cyanosis    The results of significant diagnostics from this hospitalization (including imaging, microbiology, ancillary and laboratory) are listed below for reference.     Microbiology: Recent Results (from the past 240 hour(s))  Gastrointestinal Panel by PCR , Stool     Status: Abnormal   Collection Time: 10/16/21  9:37 PM  Specimen: Stool  Result Value Ref Range Status   Campylobacter species NOT DETECTED NOT DETECTED Final   Plesimonas shigelloides NOT DETECTED NOT DETECTED Final   Salmonella species NOT DETECTED NOT DETECTED Final   Yersinia enterocolitica NOT DETECTED NOT DETECTED Final   Vibrio species NOT DETECTED NOT DETECTED Final   Vibrio cholerae NOT DETECTED NOT DETECTED Final   Enteroaggregative E coli (EAEC) NOT DETECTED NOT DETECTED Final   Enteropathogenic E coli (EPEC) NOT DETECTED NOT DETECTED Final   Enterotoxigenic E coli (ETEC) DETECTED (A) NOT DETECTED Final    Comment: RESULT CALLED TO, READ BACK BY AND VERIFIED WITH: KRISTIN LANGLEY ON 10/17/21 AT 1840 QSD    Shiga like toxin producing E coli (STEC) NOT DETECTED NOT  DETECTED Final   Shigella/Enteroinvasive E coli (EIEC) NOT DETECTED NOT DETECTED Final   Cryptosporidium NOT DETECTED NOT DETECTED Final   Cyclospora cayetanensis NOT DETECTED NOT DETECTED Final   Entamoeba histolytica NOT DETECTED NOT DETECTED Final   Giardia lamblia NOT DETECTED NOT DETECTED Final   Adenovirus F40/41 NOT DETECTED NOT DETECTED Final   Astrovirus NOT DETECTED NOT DETECTED Final   Norovirus GI/GII NOT DETECTED NOT DETECTED Final   Rotavirus A NOT DETECTED NOT DETECTED Final   Sapovirus (I, II, IV, and V) NOT DETECTED NOT DETECTED Final    Comment: Performed at [facility], [address] [place], [place] [address]  Culture, blood (Routine X 2) w Reflex to ID Panel     Status: None (Preliminary result)   Collection Time: 10/20/21 10:48 AM   Specimen: BLOOD RIGHT HAND  Result Value Ref Range Status   Specimen Description   Final    BLOOD RIGHT HAND Performed at [facility], [place] [address], [place], [place] [address]    Special Requests   Final    IN PEDIATRIC BOTTLE Blood Culture adequate volume Performed at [facility] [address], [place], [place] [address]    Culture   Final    NO GROWTH 2 DAYS Performed at [facility] [address] [place], [place] [address]    Report Status PENDING  Incomplete  Culture, blood (Routine X 2) w Reflex to ID Panel     Status: None (Preliminary result)   Collection Time: 10/20/21 10:48 AM   Specimen: BLOOD  Result Value Ref Range Status   Specimen Description   Final    BLOOD RIGHT ANTECUBITAL Performed at [facility] [address] [place], [place] [address]    Special Requests   Final    IN PEDIATRIC BOTTLE Blood Culture adequate volume Performed at [facility] [address] [place], [place] [address]    Culture   Final    NO GROWTH 2 DAYS Performed at [facility] [address], [place], [place] [address]     Report Status PENDING  Incomplete  Urine Culture     Status: Abnormal   Collection Time: 10/20/21  6:23 PM   Specimen: In/Out Cath Urine  Result Value Ref Range Status   Specimen Description   Final    IN/OUT CATH URINE Performed at [facility] [address] [place],  [address]    Special Requests   Final    NONE Performed at [facility], [place] [address], [place],  [address]    Culture >=100,000 COLONIES/mL KLEBSIELLA PNEUMONIAE (A)  Final   Report Status 10/22/2021 FINAL  Final  Organism ID, Bacteria KLEBSIELLA PNEUMONIAE (A)  Final      Susceptibility   Klebsiella pneumoniae - MIC*    AMPICILLIN >=32 RESISTANT Resistant     CEFAZOLIN <=4 SENSITIVE Sensitive     CEFEPIME <=0.12 SENSITIVE Sensitive     CEFTRIAXONE <=0.25 SENSITIVE Sensitive     CIPROFLOXACIN <=0.25 SENSITIVE Sensitive     GENTAMICIN <=1 SENSITIVE Sensitive     IMIPENEM <=0.25 SENSITIVE Sensitive     NITROFURANTOIN 64 INTERMEDIATE Intermediate     TRIMETH/SULFA <=20 SENSITIVE Sensitive     AMPICILLIN/SULBACTAM 8 SENSITIVE Sensitive     PIP/TAZO <=4 SENSITIVE Sensitive     * >=100,000 COLONIES/mL KLEBSIELLA PNEUMONIAE     Labs: BNP (last 3 results) No results for input(s): "BNP" in the last 8760 hours. Basic Metabolic Panel: Recent Labs  Lab 10/16/21 2319 10/17/21 0535 10/17/21 1404 10/18/21 0536 10/19/21 9163 10/20/21 0535 10/21/21 0544 10/22/21 0706  NA  --  129*   < > 135 134* 134* 135 134*  K  --  3.5   < > 3.0* 4.0 3.6 3.5 3.2*  CL  --  99   < > 105 103 103 101 101  CO2  --  19*   < > '24 22 23 26 25  '$ GLUCOSE  --  90   < > 119* 112* 154* 166* 135*  BUN  --  16   < > 7* '10 16 18 15  '$ CREATININE  --  1.16*   < > 0.62 0.50 0.48 0.53 0.48  CALCIUM  --  8.8*   < > 9.0 9.0 9.2 9.3 8.8*  MG 1.8 1.8  --  1.9  --  1.8 2.0 2.1  PHOS 3.5 3.7  --   --   --   --   --   --    < > = values in this interval not displayed.   Liver Function  Tests: Recent Labs  Lab 10/16/21 1042 10/16/21 2319 10/17/21 0535 10/18/21 0536 10/20/21 0535  AST 65* 50* 42* 29 29  ALT '28 25 22 21 21  '$ ALKPHOS 153* 131* 125 122 122  BILITOT 1.7* 1.3* 1.0 0.5 0.6  PROT 7.7 6.6 5.9* 6.3* 6.6  ALBUMIN 4.3 3.6 3.3* 3.5 3.2*   Recent Labs  Lab 10/16/21 1042  LIPASE 19   No results for input(s): "AMMONIA" in the last 168 hours. CBC: Recent Labs  Lab 10/16/21 1042 10/17/21 0535 10/18/21 0536 10/19/21 0621 10/20/21 0535 10/21/21 0544 10/22/21 0706  WBC 13.6*   < > 9.4 27.4* 36.2* 24.6* 17.7*  NEUTROABS 11.6*  --   --   --  33.5* 22.4* 15.4*  HGB 12.8   < > 11.3* 12.7 12.0 12.2 11.8*  HCT 35.6*   < > 33.6* 36.9 35.0* 36.4 35.3*  MCV 81.8   < > 88.0 86.6 86.4 87.5 88.5  PLT 372   < > 248 252 238 208 189   < > = values in this interval not displayed.   Cardiac Enzymes: Recent Labs  Lab 10/16/21 2319 10/17/21 0535  CKTOTAL 498* 374*   BNP: Invalid input(s): "POCBNP" CBG: Recent Labs  Lab 10/16/21 2147  GLUCAP 103*   D-Dimer No results for input(s): "DDIMER" in the last 72 hours. Hgb A1c No results for input(s): "HGBA1C" in the last 72 hours. Lipid Profile No results for input(s): "CHOL", "HDL", "LDLCALC", "TRIG", "CHOLHDL", "LDLDIRECT" in the last 72 hours. Thyroid function studies No results for input(s): "TSH", "T4TOTAL", "T3FREE", "THYROIDAB"  in the last 72 hours.  Invalid input(s): "FREET3" Anemia work up No results for input(s): "VITAMINB12", "FOLATE", "FERRITIN", "TIBC", "IRON", "RETICCTPCT" in the last 72 hours. Urinalysis    Component Value Date/Time   COLORURINE YELLOW 10/20/2021 1604   APPEARANCEUR CLOUDY (A) 10/20/2021 1604   LABSPEC 1.019 10/20/2021 1604   PHURINE 5.0 10/20/2021 1604   GLUCOSEU 150 (A) 10/20/2021 1604   HGBUR NEGATIVE 10/20/2021 1604   BILIRUBINUR NEGATIVE 10/20/2021 1604   KETONESUR NEGATIVE 10/20/2021 1604   PROTEINUR 30 (A) 10/20/2021 1604   UROBILINOGEN 1.0 09/14/2008 2239    NITRITE NEGATIVE 10/20/2021 1604   LEUKOCYTESUR LARGE (A) 10/20/2021 1604   Sepsis Labs Recent Labs  Lab 10/19/21 0621 10/20/21 0535 10/21/21 0544 10/22/21 0706  WBC 27.4* 36.2* 24.6* 17.7*   Microbiology Recent Results (from the past 240 hour(s))  Gastrointestinal Panel by PCR , Stool     Status: Abnormal   Collection Time: 10/16/21  9:37 PM   Specimen: Stool  Result Value Ref Range Status   Campylobacter species NOT DETECTED NOT DETECTED Final   Plesimonas shigelloides NOT DETECTED NOT DETECTED Final   Salmonella species NOT DETECTED NOT DETECTED Final   Yersinia enterocolitica NOT DETECTED NOT DETECTED Final   Vibrio species NOT DETECTED NOT DETECTED Final   Vibrio cholerae NOT DETECTED NOT DETECTED Final   Enteroaggregative E coli (EAEC) NOT DETECTED NOT DETECTED Final   Enteropathogenic E coli (EPEC) NOT DETECTED NOT DETECTED Final   Enterotoxigenic E coli (ETEC) DETECTED (A) NOT DETECTED Final    Comment: RESULT CALLED TO, READ BACK BY AND VERIFIED WITH: KRISTIN LANGLEY ON 10/17/21 AT 1840 QSD    Shiga like toxin producing E coli (STEC) NOT DETECTED NOT DETECTED Final   Shigella/Enteroinvasive E coli (EIEC) NOT DETECTED NOT DETECTED Final   Cryptosporidium NOT DETECTED NOT DETECTED Final   Cyclospora cayetanensis NOT DETECTED NOT DETECTED Final   Entamoeba histolytica NOT DETECTED NOT DETECTED Final   Giardia lamblia NOT DETECTED NOT DETECTED Final   Adenovirus F40/41 NOT DETECTED NOT DETECTED Final   Astrovirus NOT DETECTED NOT DETECTED Final   Norovirus GI/GII NOT DETECTED NOT DETECTED Final   Rotavirus A NOT DETECTED NOT DETECTED Final   Sapovirus (I, II, IV, and V) NOT DETECTED NOT DETECTED Final    Comment: Performed at [facility], [place] [place], [place] [address]  Culture, blood (Routine X 2) w Reflex to ID Panel     Status: None (Preliminary result)   Collection Time: 10/20/21 10:48 AM   Specimen: BLOOD RIGHT HAND  Result Value Ref Range  Status   Specimen Description   Final    BLOOD RIGHT HAND Performed at [facility], [place] [address], [place], [place] [address]    Special Requests   Final    IN PEDIATRIC BOTTLE Blood Culture adequate volume Performed at [facility] [address], [place], [place] [address]    Culture   Final    NO GROWTH 2 DAYS Performed at [facility] [address] [place], [place] [address]    Report Status PENDING  Incomplete  Culture, blood (Routine X 2) w Reflex to ID Panel     Status: None (Preliminary result)   Collection Time: 10/20/21 10:48 AM   Specimen: BLOOD  Result Value Ref Range Status   Specimen Description   Final    BLOOD RIGHT ANTECUBITAL Performed at [facility] [address], [place], [place] [address]  Special Requests   Final    IN PEDIATRIC BOTTLE Blood Culture adequate volume Performed at [facility] [address], [place], [place] [address]    Culture   Final    NO GROWTH 2 DAYS Performed at [facility] [address] [place], [place] [address]    Report Status PENDING  Incomplete  Urine Culture     Status: Abnormal   Collection Time: 10/20/21  6:23 PM   Specimen: In/Out Cath Urine  Result Value Ref Range Status   Specimen Description   Final    IN/OUT CATH URINE Performed at [facility] [address] [place], [place] [address]    Special Requests   Final    NONE Performed at [facility], [place] [address], [place], [place] [address]    Culture >=100,000 COLONIES/mL KLEBSIELLA PNEUMONIAE (A)  Final   Report Status 10/22/2021 FINAL  Final   Organism ID, Bacteria KLEBSIELLA PNEUMONIAE (A)  Final      Susceptibility   Klebsiella pneumoniae - MIC*    AMPICILLIN >=32 RESISTANT Resistant     CEFAZOLIN <=4 SENSITIVE Sensitive     CEFEPIME <=0.12 SENSITIVE Sensitive     CEFTRIAXONE <=0.25 SENSITIVE Sensitive      CIPROFLOXACIN <=0.25 SENSITIVE Sensitive     GENTAMICIN <=1 SENSITIVE Sensitive     IMIPENEM <=0.25 SENSITIVE Sensitive     NITROFURANTOIN 64 INTERMEDIATE Intermediate     TRIMETH/SULFA <=20 SENSITIVE Sensitive     AMPICILLIN/SULBACTAM 8 SENSITIVE Sensitive     PIP/TAZO <=4 SENSITIVE Sensitive     * >=100,000 COLONIES/mL KLEBSIELLA PNEUMONIAE     Time coordinating discharge: 35 minutes  SIGNED:   Aline August, MD  [facility] 10/22/2021, 11:17 AM

## 2021-10-22 NOTE — Progress Notes (Signed)
Pt's Ermergency contact Natividad Brood called, stated that he will send his wife to pick up the patient, ride is 45 minutes away.

## 2021-10-22 NOTE — Progress Notes (Signed)
Pt. Tolerated Soft diet, ready for d/c, Pt, trying to call friend for a ride.

## 2021-10-25 LAB — CULTURE, BLOOD (ROUTINE X 2)
Culture: NO GROWTH
Culture: NO GROWTH
Special Requests: ADEQUATE
Special Requests: ADEQUATE

## 2021-11-10 ENCOUNTER — Telehealth (HOSPITAL_COMMUNITY): Payer: Medicare Other | Admitting: Psychiatry

## 2021-11-10 ENCOUNTER — Telehealth (HOSPITAL_COMMUNITY): Payer: Self-pay | Admitting: Psychiatry

## 2021-11-10 NOTE — Telephone Encounter (Signed)
Patient was not present on video platform used through [employer]. I called the patient at our scheduled appointment time. A message came up saying "welcome to [employer]. The number you dial has either been changed or is no longer in service".  I was not able to speak with the patient today, as they were a no show for their scheduled appointment.

## 2021-11-18 ENCOUNTER — Telehealth (HOSPITAL_COMMUNITY): Payer: Self-pay | Admitting: *Deleted

## 2021-11-18 NOTE — Telephone Encounter (Signed)
Writer returned pt call after pt LVM requesting refilling Zoloft 100 mg bid. Pt stated that she is completely out of this medication. Per EMR pt was prescribed #180 on 10/22/21 from another provider. Pt does not have any future scheduled appointments. Writer was unable to complete call as each time calling pt phone number, a recorded message comes on that number is not available.

## 2021-12-08 ENCOUNTER — Telehealth (HOSPITAL_COMMUNITY): Payer: Medicare Other | Admitting: Psychiatry

## 2021-12-13 ENCOUNTER — Other Ambulatory Visit (HOSPITAL_COMMUNITY): Payer: Self-pay | Admitting: *Deleted

## 2021-12-13 DIAGNOSIS — F4310 Post-traumatic stress disorder, unspecified: Secondary | ICD-10-CM

## 2021-12-13 DIAGNOSIS — F251 Schizoaffective disorder, depressive type: Secondary | ICD-10-CM

## 2021-12-13 DIAGNOSIS — F5102 Adjustment insomnia: Secondary | ICD-10-CM

## 2022-01-05 ENCOUNTER — Emergency Department (HOSPITAL_COMMUNITY): Payer: 59

## 2022-01-05 ENCOUNTER — Observation Stay (HOSPITAL_COMMUNITY): Payer: 59

## 2022-01-05 ENCOUNTER — Inpatient Hospital Stay (HOSPITAL_COMMUNITY)
Admission: EM | Admit: 2022-01-05 | Discharge: 2022-01-13 | DRG: 100 | Disposition: A | Discharge status: Skilled Nursing Facility | Payer: 59 | Attending: Family Medicine | Admitting: Family Medicine

## 2022-01-05 DIAGNOSIS — Z880 Allergy status to penicillin: Secondary | ICD-10-CM

## 2022-01-05 DIAGNOSIS — R45851 Suicidal ideations: Secondary | ICD-10-CM | POA: Diagnosis present

## 2022-01-05 DIAGNOSIS — D649 Anemia, unspecified: Secondary | ICD-10-CM

## 2022-01-05 DIAGNOSIS — Z818 Family history of other mental and behavioral disorders: Secondary | ICD-10-CM

## 2022-01-05 DIAGNOSIS — F4310 Post-traumatic stress disorder, unspecified: Secondary | ICD-10-CM | POA: Diagnosis present

## 2022-01-05 DIAGNOSIS — F419 Anxiety disorder, unspecified: Secondary | ICD-10-CM | POA: Diagnosis not present

## 2022-01-05 DIAGNOSIS — G40909 Epilepsy, unspecified, not intractable, without status epilepticus: Secondary | ICD-10-CM | POA: Diagnosis not present

## 2022-01-05 DIAGNOSIS — W06XXXA Fall from bed, initial encounter: Secondary | ICD-10-CM | POA: Diagnosis present

## 2022-01-05 DIAGNOSIS — G4700 Insomnia, unspecified: Secondary | ICD-10-CM | POA: Diagnosis not present

## 2022-01-05 DIAGNOSIS — R4182 Altered mental status, unspecified: Secondary | ICD-10-CM | POA: Diagnosis not present

## 2022-01-05 DIAGNOSIS — I10 Essential (primary) hypertension: Secondary | ICD-10-CM | POA: Diagnosis present

## 2022-01-05 DIAGNOSIS — K219 Gastro-esophageal reflux disease without esophagitis: Secondary | ICD-10-CM | POA: Diagnosis present

## 2022-01-05 DIAGNOSIS — Z8619 Personal history of other infectious and parasitic diseases: Secondary | ICD-10-CM

## 2022-01-05 DIAGNOSIS — S0181XA Laceration without foreign body of other part of head, initial encounter: Secondary | ICD-10-CM | POA: Diagnosis present

## 2022-01-05 DIAGNOSIS — R569 Unspecified convulsions: Secondary | ICD-10-CM

## 2022-01-05 DIAGNOSIS — K5790 Diverticulosis of intestine, part unspecified, without perforation or abscess without bleeding: Secondary | ICD-10-CM | POA: Diagnosis present

## 2022-01-05 DIAGNOSIS — I619 Nontraumatic intracerebral hemorrhage, unspecified: Secondary | ICD-10-CM

## 2022-01-05 DIAGNOSIS — F251 Schizoaffective disorder, depressive type: Secondary | ICD-10-CM | POA: Diagnosis present

## 2022-01-05 DIAGNOSIS — S0633AA Contusion and laceration of cerebrum, unspecified, with loss of consciousness status unknown, initial encounter: Secondary | ICD-10-CM

## 2022-01-05 DIAGNOSIS — Z86010 Personal history of colonic polyps: Secondary | ICD-10-CM

## 2022-01-05 DIAGNOSIS — Y92003 Bedroom of unspecified non-institutional (private) residence as the place of occurrence of the external cause: Secondary | ICD-10-CM

## 2022-01-05 DIAGNOSIS — Z9183 Wandering in diseases classified elsewhere: Secondary | ICD-10-CM

## 2022-01-05 DIAGNOSIS — Z9151 Personal history of suicidal behavior: Secondary | ICD-10-CM

## 2022-01-05 DIAGNOSIS — N179 Acute kidney failure, unspecified: Secondary | ICD-10-CM | POA: Diagnosis present

## 2022-01-05 DIAGNOSIS — N390 Urinary tract infection, site not specified: Secondary | ICD-10-CM | POA: Diagnosis present

## 2022-01-05 DIAGNOSIS — S06359A Traumatic hemorrhage of left cerebrum with loss of consciousness of unspecified duration, initial encounter: Secondary | ICD-10-CM | POA: Diagnosis present

## 2022-01-05 DIAGNOSIS — Z79899 Other long term (current) drug therapy: Secondary | ICD-10-CM

## 2022-01-05 DIAGNOSIS — E876 Hypokalemia: Secondary | ICD-10-CM | POA: Diagnosis present

## 2022-01-05 DIAGNOSIS — R55 Syncope and collapse: Secondary | ICD-10-CM

## 2022-01-05 DIAGNOSIS — F17210 Nicotine dependence, cigarettes, uncomplicated: Secondary | ICD-10-CM | POA: Diagnosis present

## 2022-01-05 DIAGNOSIS — Z23 Encounter for immunization: Secondary | ICD-10-CM

## 2022-01-05 DIAGNOSIS — B961 Klebsiella pneumoniae [K. pneumoniae] as the cause of diseases classified elsewhere: Secondary | ICD-10-CM | POA: Diagnosis present

## 2022-01-05 DIAGNOSIS — R079 Chest pain, unspecified: Secondary | ICD-10-CM

## 2022-01-05 DIAGNOSIS — E860 Dehydration: Secondary | ICD-10-CM | POA: Diagnosis present

## 2022-01-05 DIAGNOSIS — Z9181 History of falling: Secondary | ICD-10-CM

## 2022-01-05 DIAGNOSIS — D509 Iron deficiency anemia, unspecified: Secondary | ICD-10-CM | POA: Diagnosis present

## 2022-01-05 LAB — RAPID URINE DRUG SCREEN, HOSP PERFORMED
Amphetamines: NOT DETECTED
Barbiturates: POSITIVE — AB
Benzodiazepines: NOT DETECTED
Cocaine: NOT DETECTED
Opiates: NOT DETECTED
Tetrahydrocannabinol: NOT DETECTED

## 2022-01-05 LAB — URINALYSIS, ROUTINE W REFLEX MICROSCOPIC
Bilirubin Urine: NEGATIVE
Glucose, UA: NEGATIVE mg/dL
Hgb urine dipstick: NEGATIVE
Ketones, ur: 5 mg/dL — AB
Nitrite: POSITIVE — AB
Protein, ur: NEGATIVE mg/dL
Specific Gravity, Urine: 1.012 (ref 1.005–1.030)
pH: 5 (ref 5.0–8.0)

## 2022-01-05 LAB — I-STAT CHEM 8, ED
BUN: 19 mg/dL (ref 8–23)
Calcium, Ion: 0.91 mmol/L — ABNORMAL LOW (ref 1.15–1.40)
Chloride: 107 mmol/L (ref 98–111)
Creatinine, Ser: 1.7 mg/dL — ABNORMAL HIGH (ref 0.44–1.00)
Glucose, Bld: 84 mg/dL (ref 70–99)
HCT: 29 % — ABNORMAL LOW (ref 36.0–46.0)
Hemoglobin: 9.9 g/dL — ABNORMAL LOW (ref 12.0–15.0)
Potassium: 3.2 mmol/L — ABNORMAL LOW (ref 3.5–5.1)
Sodium: 135 mmol/L (ref 135–145)
TCO2: 20 mmol/L — ABNORMAL LOW (ref 22–32)

## 2022-01-05 LAB — COMPREHENSIVE METABOLIC PANEL
ALT: 14 U/L (ref 0–44)
AST: 28 U/L (ref 15–41)
Albumin: 2.7 g/dL — ABNORMAL LOW (ref 3.5–5.0)
Alkaline Phosphatase: 56 U/L (ref 38–126)
Anion gap: 7 (ref 5–15)
BUN: 18 mg/dL (ref 8–23)
CO2: 17 mmol/L — ABNORMAL LOW (ref 22–32)
Calcium: 7.4 mg/dL — ABNORMAL LOW (ref 8.9–10.3)
Chloride: 110 mmol/L (ref 98–111)
Creatinine, Ser: 1.61 mg/dL — ABNORMAL HIGH (ref 0.44–1.00)
GFR, Estimated: 35 mL/min — ABNORMAL LOW (ref >60)
Glucose, Bld: 78 mg/dL (ref 70–99)
Potassium: 3.1 mmol/L — ABNORMAL LOW (ref 3.5–5.1)
Sodium: 134 mmol/L — ABNORMAL LOW (ref 135–145)
Total Bilirubin: 0.5 mg/dL (ref 0.3–1.2)
Total Protein: 5.5 g/dL — ABNORMAL LOW (ref 6.5–8.1)

## 2022-01-05 LAB — CBC WITH DIFFERENTIAL/PLATELET
Abs Immature Granulocytes: 0.04 10*3/uL (ref 0.00–0.07)
Basophils Absolute: 0.1 10*3/uL (ref 0.0–0.1)
Basophils Relative: 1 %
Eosinophils Absolute: 0.1 10*3/uL (ref 0.0–0.5)
Eosinophils Relative: 1 %
HCT: 34.7 % — ABNORMAL LOW (ref 36.0–46.0)
Hemoglobin: 11 g/dL — ABNORMAL LOW (ref 12.0–15.0)
Immature Granulocytes: 1 %
Lymphocytes Relative: 20 %
Lymphs Abs: 1.6 10*3/uL (ref 0.7–4.0)
MCH: 29.1 pg (ref 26.0–34.0)
MCHC: 31.7 g/dL (ref 30.0–36.0)
MCV: 91.8 fL (ref 80.0–100.0)
Monocytes Absolute: 0.6 10*3/uL (ref 0.1–1.0)
Monocytes Relative: 8 %
Neutro Abs: 5.4 10*3/uL (ref 1.7–7.7)
Neutrophils Relative %: 69 %
Platelets: 270 10*3/uL (ref 150–400)
RBC: 3.78 MIL/uL — ABNORMAL LOW (ref 3.87–5.11)
RDW: 15.8 % — ABNORMAL HIGH (ref 11.5–15.5)
WBC: 7.9 10*3/uL (ref 4.0–10.5)
nRBC: 0 % (ref 0.0–0.2)

## 2022-01-05 LAB — ETHANOL: Alcohol, Ethyl (B): <10 mg/dL (ref <10)

## 2022-01-05 LAB — TROPONIN I (HIGH SENSITIVITY): Troponin I (High Sensitivity): 33 ng/L — ABNORMAL HIGH (ref <18)

## 2022-01-05 MED ORDER — LIDOCAINE-EPINEPHRINE-TETRACAINE (LET) TOPICAL GEL
3.0000 mL | Freq: Once | TOPICAL | Status: AC
  Administered 2022-01-05: 3 mL via TOPICAL
  Filled 2022-01-05: qty 3

## 2022-01-05 MED ORDER — SODIUM CHLORIDE 0.9 % IV SOLN
100.0000 mg | Freq: Two times a day (BID) | INTRAVENOUS | Status: DC
  Administered 2022-01-06: 100 mg via INTRAVENOUS
  Filled 2022-01-05 (×3): qty 10

## 2022-01-05 MED ORDER — POTASSIUM CHLORIDE 10 MEQ/100ML IV SOLN
10.0000 meq | INTRAVENOUS | Status: AC
  Administered 2022-01-05 – 2022-01-06 (×4): 10 meq via INTRAVENOUS
  Filled 2022-01-05 (×4): qty 100

## 2022-01-05 MED ORDER — LORAZEPAM 2 MG/ML IJ SOLN: 4.0000 mg | INTRAMUSCULAR | Status: DC | PRN

## 2022-01-05 MED ORDER — LIDOCAINE-EPINEPHRINE (PF) 2 %-1:200000 IJ SOLN
10.0000 mL | Freq: Once | INTRAMUSCULAR | Status: AC
  Administered 2022-01-05: 10 mL
  Filled 2022-01-05: qty 20

## 2022-01-05 MED ORDER — SODIUM CHLORIDE 0.9 % IV SOLN
2.0000 g | Freq: Two times a day (BID) | INTRAVENOUS | Status: DC
  Administered 2022-01-05: 2 g via INTRAVENOUS
  Filled 2022-01-05: qty 12.5

## 2022-01-05 MED ORDER — SODIUM CHLORIDE 0.9 % IV SOLN: INTRAVENOUS | Status: AC

## 2022-01-05 MED ORDER — SODIUM CHLORIDE 0.9 % IV BOLUS
1000.0000 mL | Freq: Once | INTRAVENOUS | Status: AC
  Administered 2022-01-05: 1000 mL via INTRAVENOUS

## 2022-01-05 NOTE — ED Provider Notes (Signed)
[facility] EMERGENCY DEPARTMENT Provider Note   CSN: [id number] Arrival date & time: 01/05/22  1522     History  Chief Complaint  Patient presents with   Altered Mental Status    [name] is a 66 y.o. female.  Patient is a 66 year old female who presents with altered mental status.  She was brought in by EMS after she was found wandering around her neighborhood.  She was several houses down from her home.  She also reportedly had a fall last night.  She said she fell off the bed and hit the nightstand.  She has a laceration to her left forehead.  She is not on anticoagulants.  She denies any other injuries from the fall.  Per chart review, she has a history of schizoaffective disorder, PTSD, hypertension, recent C. difficile infection in August.  She was admitted to [facility] on September 13 with encephalopathy.  She was found wandering at that point.  She also had a seizure while in the hospital on September 15.  She was started on Vimpat.  However EEG monitoring did not reveal any ongoing seizure activity.  She was intubated and in ICU for a while following this event.  She recovered and ultimately was discharged back to home.       Home Medications Prior to Admission medications   Medication Sig Start Date End Date Taking? Authorizing Provider  acidophilus (RISAQUAD) CAPS capsule Take 2 capsules by mouth daily. 09/24/21  Yes Vann, Jessica U, DO  ARIPiprazole (ABILIFY) 2 MG tablet Take 4 mg by mouth daily. 01/03/22  Yes [provider]  Lacosamide 100 MG TABS Take 100 mg by mouth 2 (two) times daily. 01/03/22  Yes [provider]  lisinopril-hydrochlorothiazide (ZESTORETIC) 20-12.5 MG tablet Take 1 tablet by mouth daily. 01/03/22  Yes [provider]  meclizine (ANTIVERT) 25 MG tablet Take 25 mg by mouth daily as needed for dizziness.   Yes [provider]  omeprazole (PRILOSEC) 40 MG capsule Take 40 mg by  mouth in the morning and at bedtime.   Yes [provider]  ondansetron (ZOFRAN-ODT) 4 MG disintegrating tablet Take 4 mg by mouth every 8 (eight) hours as needed for nausea or vomiting. 10/05/21  Yes [provider]  PREMARIN vaginal cream Place 1 Application vaginally daily. 12/06/21  Yes [provider]  sertraline (ZOLOFT) 100 MG tablet Take 1 tablet (100 mg total) by mouth 2 (two) times daily. 10/22/21  Yes Aline August, MD  benztropine (COGENTIN) 1 MG tablet Take 1 tablet (1 mg total) by mouth daily. Patient not taking: Reported on 01/05/2022 08/25/21   [name], MD  dicyclomine (BENTYL) 20 MG tablet Take 0.5 tablets (10 mg total) by mouth 3 (three) times daily before meals. Patient not taking: Reported on 01/05/2022 10/22/21   Aline August, MD  gabapentin (NEURONTIN) 300 MG capsule Take 300 mg by mouth at bedtime. Patient not taking: Reported on 01/05/2022 10/02/21   [provider]  sucralfate (CARAFATE) 1 g tablet Take 1 g by mouth 4 (four) times daily. Patient not taking: Reported on 01/05/2022 11/02/21   [provider]  traZODone (DESYREL) 100 MG tablet TAKE 2 TABLETS(200 MG) BY MOUTH AT BEDTIME AS NEEDED FOR SLEEP Patient not taking: Reported on 01/05/2022 08/25/21   [name], MD  ziprasidone (GEODON) 80 MG capsule Take 1 capsule (80 mg total) by mouth in the morning and at bedtime. Patient not taking: Reported on 01/05/2022 08/25/21  [name], MD      Allergies    Penicillins and Sulfa antibiotics    Review of Systems   Review of Systems  Constitutional:  Negative for chills, diaphoresis, fatigue and fever.  HENT:  Negative for congestion, rhinorrhea and sneezing.   Eyes: Negative.   Respiratory:  Negative for cough, chest tightness and shortness of breath.   Cardiovascular:  Negative for chest pain and leg swelling.  Gastrointestinal:  Positive for diarrhea. Negative for abdominal pain, blood in stool, nausea and  vomiting.  Genitourinary:  Negative for difficulty urinating, flank pain, frequency and hematuria.  Musculoskeletal:  Negative for arthralgias and back pain.  Skin:  Negative for rash.  Neurological:  Negative for dizziness, speech difficulty, weakness, numbness and headaches.    Physical Exam Updated Vital Signs BP 109/67   Pulse 75   Temp 98 F (36.7 C) (Oral)   Resp 19   SpO2 99%  Physical Exam Constitutional:      Appearance: She is well-developed.  HENT:     Head: Normocephalic.     Comments: 3 cm laceration to her left forehead Eyes:     Pupils: Pupils are equal, round, and reactive to light.  Neck:     Comments: No pain to the cervical, thoracic or lumbosacral spine Cardiovascular:     Rate and Rhythm: Normal rate and regular rhythm.     Heart sounds: Normal heart sounds.  Pulmonary:     Effort: Pulmonary effort is normal. No respiratory distress.     Breath sounds: Normal breath sounds. No wheezing or rales.  Chest:     Chest wall: No tenderness.  Abdominal:     General: Bowel sounds are normal.     Palpations: Abdomen is soft.     Tenderness: There is no abdominal tenderness. There is no guarding or rebound.  Musculoskeletal:        General: Normal range of motion.  Lymphadenopathy:     Cervical: No cervical adenopathy.  Skin:    General: Skin is warm and dry.     Findings: No rash.  Neurological:     General: No focal deficit present.     Mental Status: She is alert and oriented to person, place, and time.     ED Results / Procedures / Treatments   Labs (all labs ordered are listed, but only abnormal results are displayed) Labs Reviewed  COMPREHENSIVE METABOLIC PANEL - Abnormal; Notable for the following components:      Result Value   Sodium 134 (*)    Potassium 3.1 (*)    CO2 17 (*)    Creatinine, Ser 1.61 (*)    Calcium 7.4 (*)    Total Protein 5.5 (*)    Albumin 2.7 (*)    GFR, Estimated 35 (*)    All other components within normal limits   CBC WITH DIFFERENTIAL/PLATELET - Abnormal; Notable for the following components:   RBC 3.78 (*)    Hemoglobin 11.0 (*)    HCT 34.7 (*)    RDW 15.8 (*)    All other components within normal limits  URINALYSIS, ROUTINE W REFLEX MICROSCOPIC - Abnormal; Notable for the following components:   APPearance HAZY (*)    Ketones, ur 5 (*)    Nitrite POSITIVE (*)    Leukocytes,Ua LARGE (*)    Bacteria, UA MANY (*)    Non Squamous Epithelial 0-5 (*)    All other components within normal limits  RAPID URINE DRUG SCREEN, HOSP  PERFORMED - Abnormal; Notable for the following components:   Barbiturates POSITIVE (*)    All other components within normal limits  I-STAT CHEM 8, ED - Abnormal; Notable for the following components:   Potassium 3.2 (*)    Creatinine, Ser 1.70 (*)    Calcium, Ion 0.91 (*)    TCO2 20 (*)    Hemoglobin 9.9 (*)    HCT 29.0 (*)    All other components within normal limits  URINE CULTURE  ETHANOL  COMPREHENSIVE METABOLIC PANEL  CBC  MAGNESIUM  AMMONIA  TROPONIN I (HIGH SENSITIVITY)    EKG EKG Interpretation  Date/Time:  Thursday January 05 2022 15:39:30 EDT Ventricular Rate:  68 PR Interval:  162 QRS Duration: 97 QT Interval:  449 QTC Calculation: 478 R Axis:   43 Text Interpretation: Sinus rhythm Abnormal R-wave progression, early transition Borderline T abnormalities, inferior leads Confirmed by Malvin Johns [phone]) on 01/05/2022 4:59:01 PM  Radiology CT Head Wo Contrast  Result Date: 01/05/2022 CLINICAL DATA:  Head trauma.  Left frontal laceration. EXAM: CT HEAD WITHOUT CONTRAST CT CERVICAL SPINE WITHOUT CONTRAST TECHNIQUE: Multidetector CT imaging of the head and cervical spine was performed following the standard protocol without intravenous contrast. Multiplanar CT image reconstructions of the cervical spine were also generated. RADIATION DOSE REDUCTION: This exam was performed according to the departmental dose-optimization program which includes  automated exposure control, adjustment of the mA and/or kV according to patient size and/or use of iterative reconstruction technique. COMPARISON:  CT head 09/18/2021 FINDINGS: CT HEAD FINDINGS Brain: 3 x 6 mm hemorrhage in the left frontal lobe compatible with hemorrhagic contusion. 15 mm area of hemorrhage in the left frontal convexity also compatible with hemorrhagic contusion. No subdural hemorrhage identified. Ventricle size and cerebral volume within normal limits for age. Mild white matter hypodensity bilaterally similar to the prior study and consistent with chronic microvascular ischemia. No acute infarct or mass. Vascular: Negative for hyperdense vessel Skull: Negative for skull fracture. Left lateral frontal scalp laceration. Sinuses/Orbits: Paranasal sinuses clear.  Negative orbit Other: None CT CERVICAL SPINE FINDINGS Alignment: Normal Skull base and vertebrae: Negative for fracture Soft tissues and spinal canal: Negative for soft tissue mass or edema Disc levels: Moderate spondylosis. Spinal and foraminal stenosis at C4-5 C5-6 and C6-7 due to spurring Upper chest: Lung apices clear bilaterally Other: None IMPRESSION: 1. Two small areas of hemorrhagic contusion in the left frontal lobe. No subdural hemorrhage. 2. Left frontal scalp laceration. Negative for skull fracture. 3. Cervical spondylosis. Negative for cervical spine fracture. 4. These results were called by telephone at the time of interpretation on 01/05/2022 at 5:54 pm to provider Taige Housman , who verbally acknowledged these results. Electronically Signed   By: Franchot Gallo M.D.   On: 01/05/2022 17:55   CT Cervical Spine Wo Contrast  Result Date: 01/05/2022 CLINICAL DATA:  Head trauma.  Left frontal laceration. EXAM: CT HEAD WITHOUT CONTRAST CT CERVICAL SPINE WITHOUT CONTRAST TECHNIQUE: Multidetector CT imaging of the head and cervical spine was performed following the standard protocol without intravenous contrast. Multiplanar CT  image reconstructions of the cervical spine were also generated. RADIATION DOSE REDUCTION: This exam was performed according to the departmental dose-optimization program which includes automated exposure control, adjustment of the mA and/or kV according to patient size and/or use of iterative reconstruction technique. COMPARISON:  CT head 09/18/2021 FINDINGS: CT HEAD FINDINGS Brain: 3 x 6 mm hemorrhage in the left frontal lobe compatible with hemorrhagic contusion. 15 mm area of hemorrhage  in the left frontal convexity also compatible with hemorrhagic contusion. No subdural hemorrhage identified. Ventricle size and cerebral volume within normal limits for age. Mild white matter hypodensity bilaterally similar to the prior study and consistent with chronic microvascular ischemia. No acute infarct or mass. Vascular: Negative for hyperdense vessel Skull: Negative for skull fracture. Left lateral frontal scalp laceration. Sinuses/Orbits: Paranasal sinuses clear.  Negative orbit Other: None CT CERVICAL SPINE FINDINGS Alignment: Normal Skull base and vertebrae: Negative for fracture Soft tissues and spinal canal: Negative for soft tissue mass or edema Disc levels: Moderate spondylosis. Spinal and foraminal stenosis at C4-5 C5-6 and C6-7 due to spurring Upper chest: Lung apices clear bilaterally Other: None IMPRESSION: 1. Two small areas of hemorrhagic contusion in the left frontal lobe. No subdural hemorrhage. 2. Left frontal scalp laceration. Negative for skull fracture. 3. Cervical spondylosis. Negative for cervical spine fracture. 4. These results were called by telephone at the time of interpretation on 01/05/2022 at 5:54 pm to provider Kelsey Edman , who verbally acknowledged these results. Electronically Signed   By: Franchot Gallo M.D.   On: 01/05/2022 17:55    Procedures .Marland KitchenLaceration Repair  Date/Time: 01/05/2022 11:01 PM  Performed by: Malvin Johns, MD Authorized by: Malvin Johns, MD   Consent:     Consent obtained:  Verbal   Consent given by:  Patient Pre-procedure details:    Preparation:  Patient was prepped and draped in usual sterile fashion and imaging obtained to evaluate for foreign bodies Exploration:    Imaging outcome: foreign body not noted     Wound exploration: entire depth of wound visualized     Wound extent: no foreign bodies/material noted and no vascular damage noted     Contaminated: no   Treatment:    Area cleansed with:  Saline   Amount of cleaning:  Standard   Irrigation solution:  Sterile saline   Irrigation method:  Syringe   Visualized foreign bodies/material removed: no   Skin repair:    Repair method:  Sutures   Suture size:  5-0   Suture material:  Prolene   Number of sutures:  7 Approximation:    Approximation:  Close Repair type:    Repair type:  Simple Post-procedure details:    Dressing:  Open (no dressing)   Procedure completion:  Tolerated well, no immediate complications     Medications Ordered in ED Medications  lidocaine-EPINEPHrine-tetracaine (LET) topical gel (has no administration in time range)  lidocaine-EPINEPHrine (XYLOCAINE W/EPI) 2 %-1:200000 (PF) injection 10 mL (has no administration in time range)  0.9 %  sodium chloride infusion (has no administration in time range)  potassium chloride 10 mEq in 100 mL IVPB (has no administration in time range)  ceFEPIme (MAXIPIME) 2 g in sodium chloride 0.9 % 100 mL IVPB (2 g Intravenous New Bag/Given 01/05/22 2240)  lacosamide (VIMPAT) 100 mg in sodium chloride 0.9 % 25 mL IVPB (has no administration in time range)  LORazepam (ATIVAN) injection 4 mg (has no administration in time range)  sodium chloride 0.9 % bolus 1,000 mL (1,000 mLs Intravenous New Bag/Given 01/05/22 1911)    ED Course/ Medical Decision Making/ A&P                           Medical Decision Making Amount and/or Complexity of Data Reviewed Labs: ordered. Radiology: ordered.  Risk Prescription drug  management. Decision regarding hospitalization.   Patient is a 66 year old female who presents wandering around  outside.  She is oriented but clearly has some atypical behavior.  There is no focal neurologic deficits.  Her labs show a mild elevation in her creatinine but otherwise nonconcerning.  Urinalysis is still pending.  Otherwise I do not see other suggestions of infection.  She is afebrile.  Her vital signs are stable.  She had a laceration that was repaired by me.  She had a CT scan of her head and cervical spine.  There is evidence of hemorrhagic areas on her head CT.  I spoke with Dr. Arnoldo Morale with neurosurgery.  He does not advise any other treatment for this.  He does not specifically recommend a repeat head CT tomorrow.  He will evaluate the patient tomorrow.  No cervical fractures are identified.  I spoke with the family medicine service who will admit the patient for further treatment.  Final Clinical Impression(s) / ED Diagnoses Final diagnoses:  Altered mental status, unspecified altered mental status type  Focal hemorrhagic contusion of cerebrum [facility])    Rx / DC Orders ED Discharge Orders     None         Malvin Johns, MD 01/05/22 2309

## 2022-01-05 NOTE — Assessment & Plan Note (Deleted)
Resolved. Stable.  - cont delirium precautions

## 2022-01-05 NOTE — Assessment & Plan Note (Addendum)
Stable - Vimpat '150mg'$  BID - Seizure precautions - No driving for 6 months  - Ativan as needed for breakthrough seizures

## 2022-01-05 NOTE — Assessment & Plan Note (Addendum)
Stable - Medically stable for discharge to SNF - Continue PT/OT

## 2022-01-05 NOTE — Assessment & Plan Note (Deleted)
Hypomagnesemia  K 3.2>3.1 > 3.4.  9/29 EKG sinus rhythm, HR 60, QTc 464. - s/p K 40 mEq x1 - s/p Mg x 1gm  - morning BMP pending

## 2022-01-05 NOTE — Progress Notes (Signed)
Pharmacy Antibiotic Note  [name] is a 66 y.o. female admitted on 01/05/2022 with UTI.  Pharmacy has been consulted for Cefepime dosing.  WBC 7.9, afebrile Scr 1.7 UA: large leukocytes, nitrite positive, many bacteria, 21-50 WBC, 0-5 RBC, 0-5 squamous epithelial cells  Plan: Start Cefepime 2g IV q12h Monitor daily CBC, temp, SCr, and for clinical signs of improvement  F/u cultures and de-escalate antibiotics as able     Temp (24hrs), Avg:98 F (36.7 C), Min:97.9 F (36.6 C), Max:98 F (36.7 C)  Recent Labs  Lab 01/05/22 1606 01/05/22 1633  WBC 7.9  --   CREATININE 1.61* 1.70*    CrCl cannot be calculated (Unknown ideal weight.).    Allergies  Allergen Reactions   Penicillins Rash          Sulfa Antibiotics Rash    Antimicrobials this admission: Cefepime 9/28 >>   Dose adjustments this admission: N/A  Microbiology results: 9/28 UCx: pending   Thank you for allowing pharmacy to be a part of this patient's care.  Luisa Hart, PharmD, BCPS Clinical Pharmacist 01/05/2022 10:10 PM   Please refer to AMION for pharmacy phone number

## 2022-01-05 NOTE — ED Notes (Signed)
Pt changed from clothing. Brief placed on pt. Purewick applied. Encouraged to provide urine sample- informed of process for in and out catheter.

## 2022-01-05 NOTE — Hospital Course (Addendum)
[name] is a 66 y.o. female presenting with AMS and head injury after a fall.  Syncope  L frontal hemorrhagic contusion In the ED after traumatic fall from LOC resulting in head laceration.  CT head imaging revealed 2 areas of focal hemorrhage.  No cervical fractures identified. Neurosurgery consulted and did not recommend further intervention. Echo with EF 60-65%. With recent new onset seizures, unclear medicine administration at home, description of postictal state after fall, and incontinence during event; this is most likely due to to seizure. Patient picked up vimpat from pharmacy 3 days ago. EEG no seizures. Neurology was consulted and recommended increasing vimpat to '150mg'$  BID and that she has to be seizure free before she can resume driving.   Altered mental status Patient appears to be back at baseline. History of schizoaffective disorder, bipolar type.  She reports being stable on her home medications.  Recent hospitalization for AMS patient was confused walking middle of the road.  This resulted in the patient having a seizure with intubation in the ICU. UA concerning for UTI (+nitrite, LE). Ammonia wnl.   UTI (urinary tract infection) UA concerning for possible infection.  Known penicillin allergy. She also had dysuria prior to fall. Treating as symptomatic UTI. She was started on cefepime and transitioned to keflex for UTI.    AKI States she felt normal and ate and drank normal until the day of admit.  Admits little to no PO intake for 12 hours prior to fall. Unclear why she was off her feeding. Denies fever or vomiting. Presume AKI due to dehydration which would also contribute to syncope. She was given 1L bolus and home lisinopril-HCTZ was held.   Chest pain New onset chest pain beginning in the ED. Echo wnl as above. EKG within normal limits.  This most likely this is due to her known GERD. She denied chest pain at time of discharge.   Outpatient  Ensure no driving for 6  months, has to be seizure free before resuming driving F/u iron deficiency anemia, due for colonoscopy

## 2022-01-05 NOTE — Assessment & Plan Note (Deleted)
UA concerning for possible infection.  Known penicillin allergy.  - f/u urine culture - cont Keflex

## 2022-01-05 NOTE — H&P (Addendum)
Hospital Admission History and Physical Service Pager: [phone]  Patient name: [name] Medical record number: [id number] Date of Birth: February 08, 1956 Age: 66 y.o. Gender: female  Primary Care Provider: Bartholome Bill, MD Consultants: Neurosurgery, Neurology  Code Status: Full code  Preferred Emergency Contact:  Contact Information     Name Relation Home Work Mobile   [name] Friend   [phone]   [name] [phone]  [phone]   [name] [phone]     [facility] Daughter   [phone]   [name], [name]   [phone]      Chief Complaint: AMS  Assessment and Plan: [name] is a 66 y.o. female presenting with AMS  and head injury after a fall. Differential for this patient's presentation of this includes seizure, cardiogenic syncope, or brain trauma.  Past medical history includes recent new onset seizures, patient reports taking 3/5 of her new medications but she does not remember which ones. Her report of falling is typical of a seizure presentation. She reports new onset chest pain, it is unlikely that this is cardiogenic in nature due to normal EKG but warrants additional investigation. CT head showed two new brain bleeds unsure if this was caused before or after the fall.   * Syncope In the ED after traumatic fall from LOC resulting in head laceration.  CT head imaging revealed 2 areas of focal hemorrhage.  Neurosurgery was consulted in the ED and recommended overnight observation and reassessment in the morning.  Differential for syncope includes seizure, cardiogenic, or brain focal hemorrhages.  With recent new onset seizures, unclear medicine administration at home, description of postictal state after fall, and incontinence during event; this is most likely due to to seizure. - Admit to [facility]MT[facility], attending Dr. Andria Keith - Continuous cardiac monitoring] - orthostatic vitals - N.p.o. pending speech eval and pending  neurosurg evaluation  - Fall precautions - Echo ordered - EEG ordered - Delirium precautions - PT/OT/SLP evaluation - History of QTc prolongation, if patient becomes nauseous consider Compazine  Bleeding in brain [facility]) On CT head 2 small areas of hemorrhagic contusions in the left frontal lobe were identified.  There is no evidence of subdural hemorrhage.  Negative for skull fracture.  Neurosurgery was consulted and recommended overnight observation and reassessment in the morning. Hgb stable.  - Neurosurgery consulted and to see in the am, appreciate recommendations - Hold DVT prophylaxis, SCDs - Neuro checks  - Fall precautions - Seizure precautions, continue Vimpat  Seizures ([place]) History of new onset seizure on 9/16 during her previous hospitalization.  She was discharged home on Vimpat 100 mg twice daily.  The patient is unsure if she has been taking her medications correctly. - Neurology consulted, appreciate recommendations - pending EEG - IV Vimpat 100 mg bid  - Ativan as needed for breakthrough seizures - Seizure precautions - Fall precautions - Neuro checks  Altered mental status Patient appears to be back at baseline.  She is alert and oriented x3.  History of schizoaffective disorder, bipolar type.  She reports being stable on her home medications.  Recent hospitalization for AMS patient was confused walking middle of the road.  This resulted in the patient having a seizure with intubation in the ICU.  UA concerning for UTI. - Consult to neuro, appreciate recommendations - Continue p.o. Zoloft, pending speech eval - Neuro-checks, q4h - Delirium precautions - Seizure precautions  - Antibiotics for presumed UTI - Ammonia ordered, could consider INR for concerns for hepatic encephalopathy,  no known history of liver cirrhosis, historically negative for hep C, last ultrasound right upper quadrant showing normal liver.  Chest pain New onset chest pain beginning in the ED.   No recent echo on file.  EKG within normal limits.  On exam she is regular rate, regular rhythm with no murmurs.  This most likely this is due to her known GERD but with recent syncopal event we will need to rule out cardiogenic etiology. - Order echo - Trend troponins - Recheck BMP - Continuous cardiac monitoring  AKI (acute kidney injury) ([place]) Creatinine bump on BMP, likely due to prerenal etiology.  S/p 1 L bolus.  Patient reports good p.o. intake at home. - Gentle IVF - Trend BMP daily - Renally dose medications, avoid nephrotoxic agents  - hold home lisinopril-HCTZ given AKI  UTI (urinary tract infection) UA concerning for possible infection.  Known penicillin allergy.  - Order urine culture - Start cefepime IV, per pharmacy consult  Hypokalemia K 3.2>3.1.  EKG was WNL.  - replete with 40 mEq IV   - repeat BMP in the AM  - repeat EKG in the AM   HTN (hypertension) Normotensive with some low diastolics intermittently. Home meds include lisinopril-HCTZ.  - hold home lisinopril-HCTZ - monitor BP   FEN/GI: NPO pending SLP eval  VTE Prophylaxis: SCDs pending neurosurgery evaluation tomorrow   Disposition: med-tele, attending Dr. Andria Keith   History of Present Illness:  TESNEEM DUFRANE is a 66 y.o. female presenting with AMS and a fall at home. She hit her head on her nightstand. She reports feeling dizzy before falling. She also was incontinent during this episode and felt extremely tired afterwards. She does not remember how EMS arrived to her home. She has blurry vision but wears glasses at baseline. She had 5 episodes of vomiting at home after the fall but denies current nausea.   She reports new onset chest pain beginning in the ED. It is not reproducible on exam and appears to be intermittent. It radiates across her entire chest and up into her neck.   Recent hospitalization 12/21/2021.  She was found to be confused and wandering on the road.  On 9/15 she had a witnessed  seizure with CODE BLUE event found to be unresponsive.  She was intubated and had repeat CT-head which was negative and transferred to CICU on the vent.  She was started on Vimpat and placed on EEG.  She recovered neurologically with no further seizures captured on EEG.  She was extubated on 9/16 and doing well stable transfer out of the ICU. She was discharged home with new seizure medication Vimpat 100 mg BID.   In the ED, she was found to be altered.  CT head without contrast showed 2 small areas of hemorrhagic contusion in the left frontal lobe.  Neurosurgery was consulted and recommended overnight observation.  They do not believe that imaging findings are contributing to her AMS.  She received 1 L of bolus and returned to her baseline mentally.  Review Of Systems: Per HPI with the following additions: diarrhea, vomiting, chest pain, SOB, hearing loss.   Pertinent Past Medical History: Schizoaffective disorder  PTSD HTN 11/2021 c diff infection, completed antibiotics  Seizures  Remainder reviewed in history tab.   Pertinent Past Surgical History: Cholecystectomy Hemorrhoid banding    Remainder reviewed in history tab.   Pertinent Social History: Tobacco use: Former, quit 10 years ago  Alcohol use: no Other Substance use: no Lives by herself  Pertinent Family History: Father: depression, suicidality  Sister: depression, suicidality  Remainder reviewed in history tab.   Important Outpatient Medications: Vimpat 100 mg BID  Abilify 2 mg daily  Cogentin  Bentyl  Lisinopril-HCTZ  Prilosec 40 mg  Zoloft 200 mg  Remainder reviewed in medication history.   Objective: BP 104/68   Pulse (!) 57   Temp (!) 97.4 F (36.3 C) (Oral)   Resp 16   SpO2 94%  Exam: General: Chronically ill-appearing, no acute distress Eyes: Extraocular movement intact, pupils are equal and reactive to light Cardiovascular: Regular rate, regular rhythm, no murmur on exam Respiratory: No increased  work of breathing, patient satting well on room air.  Lungs clear, no wheezing, no consolidations. Gastrointestinal: soft, non-tender, nondistended, presence of bowel sounds MSK: no LE edema noted bilaterally, distal pulses strong and equal bilaterally  Derm: Head laceration, repaired Neuro: Alert and oriented x3, no facial asymmetry, no slurring speech, cranial nerve exam within normal limits, 5/5 UE and LE strength bilaterally, CN 2-12 grossly intact, gross sensation intact Psych: Patient was able to answer questions appropriately, but appeared to be slow with responses. Mood appropriate, no agitation noted.   Labs:  CBC BMET  Recent Labs  Lab 01/06/22 0156  WBC 8.1  HGB 10.1*  HCT 30.5*  PLT 279   Recent Labs  Lab 01/06/22 0156  NA 136  K 3.4*  CL 105  CO2 19*  BUN 14  CREATININE 1.05*  GLUCOSE 80  CALCIUM 8.7*     EKG: sinus rhythm, normal axis with no deviation, no st-elevations   Imaging Studies Performed:  CT head and neck w/o contrast:  - two small areas of hemorrhagic contusion in the left frontal lobe.  - No subdural hemorrhage  - left frontal scalp laceration. Negative for skull fracture  - cervical spondylosis. Negative for cervical spine fracture   Donney Dice, DO 01/06/2022, 6:47 AM PGY-1, [place]  I was personally present and performed or re-performed the history, physical exam and medical decision making activities of this service and have verified that the service and findings are accurately documented in the intern's note. My edits are noted within the note above. Please also see attending's attestation.   Donney Dice, DO                  01/06/2022, 6:47 AM  PGY-3, [place] Intern pager: [phone], text pages welcome Secure chat group [place]

## 2022-01-05 NOTE — ED Triage Notes (Signed)
BIB [facility] from neighborhood. DC yesterday from [facility]- unsure of reason. Found wandering 3 houses down from home. Pt reports she fell last night lac to left forehead- no thinners. Found to be hypotensive 80's/40's on scene. Answers orientation questions correctly but cannot say why she was wandering around. Psych and SZ HX. Complains of HA where she his it.  7104m enroute BP improves to 106/55

## 2022-01-05 NOTE — Assessment & Plan Note (Deleted)
S/P traumatic fall from LOC resulting in head laceration.  CT head imaging: 2 areas of focal hemorrhage.  Neurosurgery consulted, with no further intervention recommended.  EEG no seizures. HR 55-60s.  - f/u orthostatic vitals - Fall precautions - echo: EF 60-65%, no wall motion abnormalities, no valve disease  - History of QTc prolongation, if patient becomes nauseous consider Compazine

## 2022-01-05 NOTE — Assessment & Plan Note (Deleted)
Creatinine bump on BMP, likely due to prerenal etiology.  S/p 1 L bolus.  Patient reports good p.o. intake at home. - Gentle IVF - Trend BMP daily - Renally dose medications, avoid nephrotoxic agents  - hold home lisinopril-HCTZ given AKI

## 2022-01-05 NOTE — Assessment & Plan Note (Addendum)
New onset chest pain beginning in the ED.  No recent echo on file.  EKG within normal limits.  On exam she is regular rate, regular rhythm with no murmurs.  This most likely this is due to her known GERD but with recent syncopal event we will need to rule out cardiogenic etiology. Troponin downtrending 33 > 19. No chest pain this AM. - f/u echo - AM BMP

## 2022-01-05 NOTE — Progress Notes (Signed)
EEG complete - results pending 

## 2022-01-06 ENCOUNTER — Observation Stay (HOSPITAL_COMMUNITY): Payer: 59

## 2022-01-06 DIAGNOSIS — S0181XA Laceration without foreign body of other part of head, initial encounter: Secondary | ICD-10-CM | POA: Diagnosis present

## 2022-01-06 DIAGNOSIS — F4310 Post-traumatic stress disorder, unspecified: Secondary | ICD-10-CM | POA: Diagnosis present

## 2022-01-06 DIAGNOSIS — G40909 Epilepsy, unspecified, not intractable, without status epilepticus: Secondary | ICD-10-CM | POA: Diagnosis present

## 2022-01-06 DIAGNOSIS — Z880 Allergy status to penicillin: Secondary | ICD-10-CM | POA: Diagnosis not present

## 2022-01-06 DIAGNOSIS — R4182 Altered mental status, unspecified: Secondary | ICD-10-CM | POA: Diagnosis present

## 2022-01-06 DIAGNOSIS — Y92003 Bedroom of unspecified non-institutional (private) residence as the place of occurrence of the external cause: Secondary | ICD-10-CM | POA: Diagnosis not present

## 2022-01-06 DIAGNOSIS — W06XXXA Fall from bed, initial encounter: Secondary | ICD-10-CM | POA: Diagnosis present

## 2022-01-06 DIAGNOSIS — R079 Chest pain, unspecified: Secondary | ICD-10-CM | POA: Diagnosis present

## 2022-01-06 DIAGNOSIS — N390 Urinary tract infection, site not specified: Secondary | ICD-10-CM | POA: Diagnosis present

## 2022-01-06 DIAGNOSIS — F251 Schizoaffective disorder, depressive type: Secondary | ICD-10-CM | POA: Diagnosis present

## 2022-01-06 DIAGNOSIS — I10 Essential (primary) hypertension: Secondary | ICD-10-CM | POA: Diagnosis present

## 2022-01-06 DIAGNOSIS — S06359A Traumatic hemorrhage of left cerebrum with loss of consciousness of unspecified duration, initial encounter: Secondary | ICD-10-CM | POA: Diagnosis present

## 2022-01-06 DIAGNOSIS — S0633AA Contusion and laceration of cerebrum, unspecified, with loss of consciousness status unknown, initial encounter: Secondary | ICD-10-CM | POA: Insufficient documentation

## 2022-01-06 DIAGNOSIS — N179 Acute kidney failure, unspecified: Secondary | ICD-10-CM

## 2022-01-06 DIAGNOSIS — R55 Syncope and collapse: Secondary | ICD-10-CM

## 2022-01-06 DIAGNOSIS — Z9183 Wandering in diseases classified elsewhere: Secondary | ICD-10-CM | POA: Diagnosis not present

## 2022-01-06 DIAGNOSIS — G4700 Insomnia, unspecified: Secondary | ICD-10-CM | POA: Diagnosis not present

## 2022-01-06 DIAGNOSIS — B961 Klebsiella pneumoniae [K. pneumoniae] as the cause of diseases classified elsewhere: Secondary | ICD-10-CM | POA: Diagnosis present

## 2022-01-06 DIAGNOSIS — E860 Dehydration: Secondary | ICD-10-CM | POA: Diagnosis present

## 2022-01-06 DIAGNOSIS — K219 Gastro-esophageal reflux disease without esophagitis: Secondary | ICD-10-CM | POA: Diagnosis present

## 2022-01-06 DIAGNOSIS — R569 Unspecified convulsions: Secondary | ICD-10-CM | POA: Diagnosis not present

## 2022-01-06 DIAGNOSIS — D649 Anemia, unspecified: Secondary | ICD-10-CM

## 2022-01-06 DIAGNOSIS — Z86010 Personal history of colonic polyps: Secondary | ICD-10-CM | POA: Diagnosis not present

## 2022-01-06 DIAGNOSIS — N3000 Acute cystitis without hematuria: Secondary | ICD-10-CM

## 2022-01-06 DIAGNOSIS — Z23 Encounter for immunization: Secondary | ICD-10-CM | POA: Diagnosis present

## 2022-01-06 DIAGNOSIS — R410 Disorientation, unspecified: Secondary | ICD-10-CM | POA: Diagnosis not present

## 2022-01-06 DIAGNOSIS — F419 Anxiety disorder, unspecified: Secondary | ICD-10-CM | POA: Diagnosis not present

## 2022-01-06 DIAGNOSIS — S0636AA Traumatic hemorrhage of cerebrum, unspecified, with loss of consciousness status unknown, initial encounter: Secondary | ICD-10-CM | POA: Diagnosis not present

## 2022-01-06 DIAGNOSIS — E876 Hypokalemia: Secondary | ICD-10-CM | POA: Diagnosis present

## 2022-01-06 DIAGNOSIS — R45851 Suicidal ideations: Secondary | ICD-10-CM | POA: Diagnosis present

## 2022-01-06 DIAGNOSIS — F17210 Nicotine dependence, cigarettes, uncomplicated: Secondary | ICD-10-CM | POA: Diagnosis present

## 2022-01-06 DIAGNOSIS — D509 Iron deficiency anemia, unspecified: Secondary | ICD-10-CM | POA: Diagnosis present

## 2022-01-06 DIAGNOSIS — Z79899 Other long term (current) drug therapy: Secondary | ICD-10-CM | POA: Diagnosis not present

## 2022-01-06 LAB — ECHOCARDIOGRAM COMPLETE
Area-P 1/2: 3.08 cm2
MV VTI: 1.71 cm2
S' Lateral: 2.8 cm

## 2022-01-06 LAB — COMPREHENSIVE METABOLIC PANEL
ALT: 16 U/L (ref 0–44)
AST: 28 U/L (ref 15–41)
Albumin: 2.7 g/dL — ABNORMAL LOW (ref 3.5–5.0)
Alkaline Phosphatase: 56 U/L (ref 38–126)
Anion gap: 12 (ref 5–15)
BUN: 14 mg/dL (ref 8–23)
CO2: 19 mmol/L — ABNORMAL LOW (ref 22–32)
Calcium: 8.7 mg/dL — ABNORMAL LOW (ref 8.9–10.3)
Chloride: 105 mmol/L (ref 98–111)
Creatinine, Ser: 1.05 mg/dL — ABNORMAL HIGH (ref 0.44–1.00)
GFR, Estimated: 59 mL/min — ABNORMAL LOW (ref >60)
Glucose, Bld: 80 mg/dL (ref 70–99)
Potassium: 3.4 mmol/L — ABNORMAL LOW (ref 3.5–5.1)
Sodium: 136 mmol/L (ref 135–145)
Total Bilirubin: 0.8 mg/dL (ref 0.3–1.2)
Total Protein: 5.6 g/dL — ABNORMAL LOW (ref 6.5–8.1)

## 2022-01-06 LAB — CBC
HCT: 30.5 % — ABNORMAL LOW (ref 36.0–46.0)
Hemoglobin: 10.1 g/dL — ABNORMAL LOW (ref 12.0–15.0)
MCH: 29.7 pg (ref 26.0–34.0)
MCHC: 33.1 g/dL (ref 30.0–36.0)
MCV: 89.7 fL (ref 80.0–100.0)
Platelets: 279 10*3/uL (ref 150–400)
RBC: 3.4 MIL/uL — ABNORMAL LOW (ref 3.87–5.11)
RDW: 15.9 % — ABNORMAL HIGH (ref 11.5–15.5)
WBC: 8.1 10*3/uL (ref 4.0–10.5)
nRBC: 0 % (ref 0.0–0.2)

## 2022-01-06 LAB — AMMONIA: Ammonia: 17 umol/L (ref 9–35)

## 2022-01-06 LAB — MAGNESIUM: Magnesium: 1.7 mg/dL (ref 1.7–2.4)

## 2022-01-06 LAB — TROPONIN I (HIGH SENSITIVITY): Troponin I (High Sensitivity): 19 ng/L — ABNORMAL HIGH (ref <18)

## 2022-01-06 MED ORDER — POTASSIUM CHLORIDE CRYS ER 20 MEQ PO TBCR
40.0000 meq | EXTENDED_RELEASE_TABLET | Freq: Once | ORAL | Status: AC
  Administered 2022-01-06: 40 meq via ORAL
  Filled 2022-01-06: qty 2

## 2022-01-06 MED ORDER — SODIUM CHLORIDE 0.9 % IV SOLN
150.0000 mg | Freq: Two times a day (BID) | INTRAVENOUS | Status: DC
  Administered 2022-01-06 – 2022-01-07 (×2): 150 mg via INTRAVENOUS
  Filled 2022-01-06 (×5): qty 15

## 2022-01-06 MED ORDER — CEPHALEXIN 250 MG PO CAPS: 500.0000 mg | ORAL_CAPSULE | Freq: Two times a day (BID) | ORAL | Status: DC

## 2022-01-06 MED ORDER — SODIUM CHLORIDE 0.9 % IV SOLN
2.0000 g | Freq: Two times a day (BID) | INTRAVENOUS | Status: DC
  Administered 2022-01-06: 2 g via INTRAVENOUS
  Filled 2022-01-06: qty 12.5

## 2022-01-06 MED ORDER — ACETAMINOPHEN 325 MG PO TABS: 650.0000 mg | ORAL_TABLET | Freq: Four times a day (QID) | ORAL | Status: DC | PRN

## 2022-01-06 MED ORDER — MAGNESIUM SULFATE IN D5W 1-5 GM/100ML-% IV SOLN
1.0000 g | Freq: Once | INTRAVENOUS | Status: AC
  Administered 2022-01-06: 1 g via INTRAVENOUS
  Filled 2022-01-06: qty 100

## 2022-01-06 MED ORDER — CEPHALEXIN 500 MG PO CAPS
500.0000 mg | ORAL_CAPSULE | Freq: Two times a day (BID) | ORAL | Status: DC
  Administered 2022-01-06 – 2022-01-07 (×4): 500 mg via ORAL
  Filled 2022-01-06: qty 2
  Filled 2022-01-06 (×3): qty 1

## 2022-01-06 MED ORDER — SERTRALINE HCL 100 MG PO TABS
100.0000 mg | ORAL_TABLET | Freq: Two times a day (BID) | ORAL | Status: DC
  Administered 2022-01-06 – 2022-01-13 (×15): 100 mg via ORAL
  Filled 2022-01-06 (×15): qty 1

## 2022-01-06 MED ORDER — SODIUM CHLORIDE 0.9 % IV SOLN
100.0000 mg | Freq: Two times a day (BID) | INTRAVENOUS | Status: DC
  Administered 2022-01-06: 100 mg via INTRAVENOUS
  Filled 2022-01-06 (×2): qty 10

## 2022-01-06 MED ORDER — ARIPIPRAZOLE 2 MG PO TABS
4.0000 mg | ORAL_TABLET | Freq: Every day | ORAL | Status: DC
  Administered 2022-01-06 – 2022-01-10 (×4): 4 mg via ORAL
  Filled 2022-01-06 (×6): qty 2

## 2022-01-06 NOTE — Assessment & Plan Note (Addendum)
Normotensive. - hold home lisinopril-HCTZ - monitor BP

## 2022-01-06 NOTE — Consult Note (Signed)
Providing Compassionate, Quality Care - Together   Reason for Consult: Left frontal hemorrhagic contusion Referring Physician: Dr. Randon Goldsmith [name] is an 66 y.o. female.  HPI: Ms. [name] is a 66 year old female with a history significant for depression, diverticulosis, GERD, myocarditis, recurrent falls, schizoaffective disorder, and seizures. She reports she was in bed last night when she suffered a seizure. She struck her head on her night stand. Per report from ED provider, she was found wandering around her neighborhood. CT head in the [facility] Emergency Department revealed two small areas of hemorrhagic contusion in the left frontal lobe. The patient is not anticoagulated. Neurosurgery was consulted for further evaluation and recommendations.  Past Medical History:  Diagnosis Date   DDD (degenerative disc disease), lumbar    Depression    Diverticulosis    Dizziness    GERD (gastroesophageal reflux disease)    IBS (irritable bowel syndrome)    Internal hemorrhoid    2nd degree   Myocarditis ([place]) 2009   Osteoarthritis    Prolapsed internal hemorrhoids, grade 3 12/01/2015   Recurrent falls    Schizoaffective disorder ([place])    Tubular adenoma of colon 08/2011    Past Surgical History:  Procedure Laterality Date   BIOPSY  10/19/2021   Procedure: BIOPSY;  Surgeon: Clarene Essex, MD;  Location: [facility] ENDOSCOPY;  Service: Gastroenterology;;   BREAST BIOPSY Right 12/27/2012   CHOLECYSTECTOMY     ESOPHAGOGASTRODUODENOSCOPY (EGD) WITH PROPOFOL N/A 10/19/2021   Procedure: ESOPHAGOGASTRODUODENOSCOPY (EGD) WITH PROPOFOL;  Surgeon: Clarene Essex, MD;  Location: [facility];  Service: Gastroenterology;  Laterality: N/A;   FOREIGN BODY REMOVAL  10/19/2021   Procedure: FOREIGN BODY REMOVAL;  Surgeon: Clarene Essex, MD;  Location: [facility];  Service: Gastroenterology;;   HEMORRHOID BANDING     INTRAMEDULLARY (IM) NAIL INTERTROCHANTERIC Left 01/24/2020   Procedure: INTRAMEDULLARY  (IM) NAIL INTERTROCHANTRIC;  Surgeon: Leandrew Koyanagi, MD;  Location: [facility];  Service: Orthopedics;  Laterality: Left;    Family History  Problem Relation Age of Onset   Suicidality Father    Depression Father    Suicidality Sister    Depression Sister    Suicidality Cousin    Suicidality Other     Social History:  reports that she has been smoking cigarettes. She has a 19.00 pack-year smoking history. She has never used smokeless tobacco. She reports that she does not drink alcohol and does not use drugs.  Allergies:  Allergies  Allergen Reactions   Penicillins Rash          Sulfa Antibiotics Rash    Medications: I have reviewed the patient's current medications.  Results for orders placed or performed during the hospital encounter of 01/05/22 (from the past 48 hour(s))  Comprehensive metabolic panel     Status: Abnormal   Collection Time: 01/05/22  4:06 PM  Result Value Ref Range   Sodium 134 (L) 135 - 145 mmol/L   Potassium 3.1 (L) 3.5 - 5.1 mmol/L   Chloride 110 98 - 111 mmol/L   CO2 17 (L) 22 - 32 mmol/L   Glucose, Bld 78 70 - 99 mg/dL    Comment: Glucose reference range applies only to samples taken after fasting for at least 8 hours.   BUN 18 8 - 23 mg/dL   Creatinine, Ser 1.61 (H) 0.44 - 1.00 mg/dL   Calcium 7.4 (L) 8.9 - 10.3 mg/dL   Total Protein 5.5 (L) 6.5 - 8.1 g/dL   Albumin 2.7 (L)  3.5 - 5.0 g/dL   AST 28 15 - 41 U/L   ALT 14 0 - 44 U/L   Alkaline Phosphatase 56 38 - 126 U/L   Total Bilirubin 0.5 0.3 - 1.2 mg/dL   GFR, Estimated 35 (L) >60 mL/min    Comment: (NOTE) Calculated using the CKD-EPI Creatinine Equation (2021)    Anion gap 7 5 - 15    Comment: Performed at [facility] [address], [place], [place] [address]  Ethanol     Status: None   Collection Time: 01/05/22  4:06 PM  Result Value Ref Range   Alcohol, Ethyl (B) <10 <10 mg/dL    Comment: (NOTE) Lowest detectable limit for serum alcohol is 10 mg/dL.  For medical purposes  only. Performed at [facility], [place] [address], [place], [place] [address]   CBC with Differential     Status: Abnormal   Collection Time: 01/05/22  4:06 PM  Result Value Ref Range   WBC 7.9 4.0 - 10.5 K/uL   RBC 3.78 (L) 3.87 - 5.11 MIL/uL   Hemoglobin 11.0 (L) 12.0 - 15.0 g/dL   HCT 34.7 (L) 36.0 - 46.0 %   MCV 91.8 80.0 - 100.0 fL   MCH 29.1 26.0 - 34.0 pg   MCHC 31.7 30.0 - 36.0 g/dL   RDW 15.8 (H) 11.5 - 15.5 %   Platelets 270 150 - 400 K/uL   nRBC 0.0 0.0 - 0.2 %   Neutrophils Relative % 69 %   Neutro Abs 5.4 1.7 - 7.7 K/uL   Lymphocytes Relative 20 %   Lymphs Abs 1.6 0.7 - 4.0 K/uL   Monocytes Relative 8 %   Monocytes Absolute 0.6 0.1 - 1.0 K/uL   Eosinophils Relative 1 %   Eosinophils Absolute 0.1 0.0 - 0.5 K/uL   Basophils Relative 1 %   Basophils Absolute 0.1 0.0 - 0.1 K/uL   Immature Granulocytes 1 %   Abs Immature Granulocytes 0.04 0.00 - 0.07 K/uL    Comment: Performed at [facility] [address], [place], [place] [address]  I-stat chem 8, ED     Status: Abnormal   Collection Time: 01/05/22  4:33 PM  Result Value Ref Range   Sodium 135 135 - 145 mmol/L   Potassium 3.2 (L) 3.5 - 5.1 mmol/L   Chloride 107 98 - 111 mmol/L   BUN 19 8 - 23 mg/dL   Creatinine, Ser 1.70 (H) 0.44 - 1.00 mg/dL   Glucose, Bld 84 70 - 99 mg/dL    Comment: Glucose reference range applies only to samples taken after fasting for at least 8 hours.   Calcium, Ion 0.91 (L) 1.15 - 1.40 mmol/L   TCO2 20 (L) 22 - 32 mmol/L   Hemoglobin 9.9 (L) 12.0 - 15.0 g/dL   HCT 29.0 (L) 36.0 - 46.0 %  Urinalysis, Routine w reflex microscopic     Status: Abnormal   Collection Time: 01/05/22  9:21 PM  Result Value Ref Range   Color, Urine YELLOW YELLOW   APPearance HAZY (A) CLEAR   Specific Gravity, Urine 1.012 1.005 - 1.030   pH 5.0 5.0 - 8.0   Glucose, UA NEGATIVE NEGATIVE mg/dL   Hgb urine dipstick NEGATIVE NEGATIVE   Bilirubin Urine NEGATIVE NEGATIVE   Ketones, ur 5 (A) NEGATIVE  mg/dL   Protein, ur NEGATIVE NEGATIVE mg/dL   Nitrite POSITIVE (A) NEGATIVE   Leukocytes,Ua LARGE (A) NEGATIVE   RBC /  HPF 0-5 0 - 5 RBC/hpf   WBC, UA 21-50 0 - 5 WBC/hpf   Bacteria, UA MANY (A) NONE SEEN   Squamous Epithelial / LPF 0-5 0 - 5   Non Squamous Epithelial 0-5 (A) NONE SEEN    Comment: Performed at [facility], [place] [address] [place], [place] [address]  Rapid urine drug screen (hospital performed)     Status: Abnormal   Collection Time: 01/05/22  9:21 PM  Result Value Ref Range   Opiates NONE DETECTED NONE DETECTED   Cocaine NONE DETECTED NONE DETECTED   Benzodiazepines NONE DETECTED NONE DETECTED   Amphetamines NONE DETECTED NONE DETECTED   Tetrahydrocannabinol NONE DETECTED NONE DETECTED   Barbiturates POSITIVE (A) NONE DETECTED    Comment: (NOTE) DRUG SCREEN FOR MEDICAL PURPOSES ONLY.  IF CONFIRMATION IS NEEDED FOR ANY PURPOSE, NOTIFY LAB WITHIN 5 DAYS.  LOWEST DETECTABLE LIMITS FOR URINE DRUG SCREEN Drug Class                     Cutoff (ng/mL) Amphetamine and metabolites    1000 Barbiturate and metabolites    200 Benzodiazepine                 604 Tricyclics and metabolites     300 Opiates and metabolites        300 Cocaine and metabolites        300 THC                            50 Performed at [facility], [place] [address], [place], [place] [address]   Troponin I (High Sensitivity)     Status: Abnormal   Collection Time: 01/05/22 11:10 PM  Result Value Ref Range   Troponin I (High Sensitivity) 33 (H) <18 ng/L    Comment: (NOTE) Elevated high sensitivity troponin I (hsTnI) values and significant  changes across serial measurements may suggest ACS but many other  chronic and acute conditions are known to elevate hsTnI results.  Refer to the "Links" section for chest pain algorithms and additional  guidance. Performed at [facility], [place] [address], [place], [place] [address]   Comprehensive metabolic panel     Status: Abnormal    Collection Time: 01/06/22  1:56 AM  Result Value Ref Range   Sodium 136 135 - 145 mmol/L   Potassium 3.4 (L) 3.5 - 5.1 mmol/L   Chloride 105 98 - 111 mmol/L   CO2 19 (L) 22 - 32 mmol/L   Glucose, Bld 80 70 - 99 mg/dL    Comment: Glucose reference range applies only to samples taken after fasting for at least 8 hours.   BUN 14 8 - 23 mg/dL   Creatinine, Ser 1.05 (H) 0.44 - 1.00 mg/dL   Calcium 8.7 (L) 8.9 - 10.3 mg/dL   Total Protein 5.6 (L) 6.5 - 8.1 g/dL   Albumin 2.7 (L) 3.5 - 5.0 g/dL   AST 28 15 - 41 U/L   ALT 16 0 - 44 U/L   Alkaline Phosphatase 56 38 - 126 U/L   Total Bilirubin 0.8 0.3 - 1.2 mg/dL   GFR, Estimated 59 (L) >60 mL/min    Comment: (NOTE) Calculated using the CKD-EPI Creatinine Equation (2021)    Anion gap 12 5 - 15    Comment: Performed at [facility], [place] [address], [place], [place] [address]  CBC  Status: Abnormal   Collection Time: 01/06/22  1:56 AM  Result Value Ref Range   WBC 8.1 4.0 - 10.5 K/uL   RBC 3.40 (L) 3.87 - 5.11 MIL/uL   Hemoglobin 10.1 (L) 12.0 - 15.0 g/dL   HCT 30.5 (L) 36.0 - 46.0 %   MCV 89.7 80.0 - 100.0 fL   MCH 29.7 26.0 - 34.0 pg   MCHC 33.1 30.0 - 36.0 g/dL   RDW 15.9 (H) 11.5 - 15.5 %   Platelets 279 150 - 400 K/uL   nRBC 0.0 0.0 - 0.2 %    Comment: Performed at [facility] [address] [place], [place] [address]  Magnesium     Status: None   Collection Time: 01/06/22  1:56 AM  Result Value Ref Range   Magnesium 1.7 1.7 - 2.4 mg/dL    Comment: Performed at [facility] [address], [place], [place] [address]  Ammonia     Status: None   Collection Time: 01/06/22  1:57 AM  Result Value Ref Range   Ammonia 17 9 - 35 umol/L    Comment: Performed at [facility], [place] [address], [place], [place] [address]  Troponin I (High Sensitivity)     Status: Abnormal   Collection Time: 01/06/22  5:57 AM  Result Value Ref Range   Troponin I (High Sensitivity) 19 (H) <18 ng/L    Comment:  (NOTE) Elevated high sensitivity troponin I (hsTnI) values and significant  changes across serial measurements may suggest ACS but many other  chronic and acute conditions are known to elevate hsTnI results.  Refer to the "Links" section for chest pain algorithms and additional  guidance. Performed at [facility], [place] [address] [place], [place] [address]     EEG adult  Result Date: 01/06/2022 Lora Havens, MD     01/06/2022  7:53 AM Patient Name: [name] MRN: [id number] Epilepsy Attending: Lora Havens Referring Physician/Provider: Zenia Resides, MD Date: 01/05/2022 Duration: 21.22 mins Patient history: 66 year old female with a recent history of new onset seizures. EEG to evaluate for seizure Level of alertness: Awake AEDs during EEG study: Technical aspects: This EEG study was done with scalp electrodes positioned according to the 10-20 International system of electrode placement. Electrical activity was reviewed with band pass filter of 1-'70Hz'$ , sensitivity of 7 uV/mm, display speed of 18m/sec with a '60Hz'$  notched filter applied as appropriate. EEG data were recorded continuously and digitally stored.  Video monitoring was available and reviewed as appropriate. Description: The posterior dominant rhythm consists of 9 Hz activity of moderate voltage (25-35 uV) seen predominantly in posterior head regions, symmetric and reactive to eye opening and eye closing.  Hyperventilation and photic stimulation were not performed.   IMPRESSION: This study is within normal limits. No seizures or epileptiform discharges were seen throughout the recording. A normal interictal EEG does not exclude nor support the diagnosis of epilepsy. PLora Havens  CT Head Wo Contrast  Result Date: 01/05/2022 CLINICAL DATA:  Head trauma.  Left frontal laceration. EXAM: CT HEAD WITHOUT CONTRAST CT CERVICAL SPINE WITHOUT CONTRAST TECHNIQUE: Multidetector CT imaging of the head and cervical spine was  performed following the standard protocol without intravenous contrast. Multiplanar CT image reconstructions of the cervical spine were also generated. RADIATION DOSE REDUCTION: This exam was performed according to the departmental dose-optimization program which includes automated exposure control, adjustment of the mA and/or kV according to patient size  and/or use of iterative reconstruction technique. COMPARISON:  CT head 09/18/2021 FINDINGS: CT HEAD FINDINGS Brain: 3 x 6 mm hemorrhage in the left frontal lobe compatible with hemorrhagic contusion. 15 mm area of hemorrhage in the left frontal convexity also compatible with hemorrhagic contusion. No subdural hemorrhage identified. Ventricle size and cerebral volume within normal limits for age. Mild white matter hypodensity bilaterally similar to the prior study and consistent with chronic microvascular ischemia. No acute infarct or mass. Vascular: Negative for hyperdense vessel Skull: Negative for skull fracture. Left lateral frontal scalp laceration. Sinuses/Orbits: Paranasal sinuses clear.  Negative orbit Other: None CT CERVICAL SPINE FINDINGS Alignment: Normal Skull base and vertebrae: Negative for fracture Soft tissues and spinal canal: Negative for soft tissue mass or edema Disc levels: Moderate spondylosis. Spinal and foraminal stenosis at C4-5 C5-6 and C6-7 due to spurring Upper chest: Lung apices clear bilaterally Other: None IMPRESSION: 1. Two small areas of hemorrhagic contusion in the left frontal lobe. No subdural hemorrhage. 2. Left frontal scalp laceration. Negative for skull fracture. 3. Cervical spondylosis. Negative for cervical spine fracture. 4. These results were called by telephone at the time of interpretation on 01/05/2022 at 5:54 pm to provider MELANIE BELFI , who verbally acknowledged these results. Electronically Signed   By: Franchot Gallo M.D.   On: 01/05/2022 17:55   CT Cervical Spine Wo Contrast  Result Date: 01/05/2022 CLINICAL  DATA:  Head trauma.  Left frontal laceration. EXAM: CT HEAD WITHOUT CONTRAST CT CERVICAL SPINE WITHOUT CONTRAST TECHNIQUE: Multidetector CT imaging of the head and cervical spine was performed following the standard protocol without intravenous contrast. Multiplanar CT image reconstructions of the cervical spine were also generated. RADIATION DOSE REDUCTION: This exam was performed according to the departmental dose-optimization program which includes automated exposure control, adjustment of the mA and/or kV according to patient size and/or use of iterative reconstruction technique. COMPARISON:  CT head 09/18/2021 FINDINGS: CT HEAD FINDINGS Brain: 3 x 6 mm hemorrhage in the left frontal lobe compatible with hemorrhagic contusion. 15 mm area of hemorrhage in the left frontal convexity also compatible with hemorrhagic contusion. No subdural hemorrhage identified. Ventricle size and cerebral volume within normal limits for age. Mild white matter hypodensity bilaterally similar to the prior study and consistent with chronic microvascular ischemia. No acute infarct or mass. Vascular: Negative for hyperdense vessel Skull: Negative for skull fracture. Left lateral frontal scalp laceration. Sinuses/Orbits: Paranasal sinuses clear.  Negative orbit Other: None CT CERVICAL SPINE FINDINGS Alignment: Normal Skull base and vertebrae: Negative for fracture Soft tissues and spinal canal: Negative for soft tissue mass or edema Disc levels: Moderate spondylosis. Spinal and foraminal stenosis at C4-5 C5-6 and C6-7 due to spurring Upper chest: Lung apices clear bilaterally Other: None IMPRESSION: 1. Two small areas of hemorrhagic contusion in the left frontal lobe. No subdural hemorrhage. 2. Left frontal scalp laceration. Negative for skull fracture. 3. Cervical spondylosis. Negative for cervical spine fracture. 4. These results were called by telephone at the time of interpretation on 01/05/2022 at 5:54 pm to provider MELANIE BELFI ,  who verbally acknowledged these results. Electronically Signed   By: Franchot Gallo M.D.   On: 01/05/2022 17:55    Review of Systems  Constitutional: Negative.   HENT: Negative.    Eyes: Negative.   Respiratory: Negative.    Cardiovascular: Negative.   Gastrointestinal: Negative.   Genitourinary: Negative.   Musculoskeletal: Negative.   Skin: Negative.   Neurological:  Positive for seizures and headaches. Negative for dizziness, tingling,  sensory change, speech change, focal weakness and weakness.  Endo/Heme/Allergies: Negative.    Blood pressure (!) 145/61, pulse 67, temperature 98.7 F (37.1 C), temperature source Oral, resp. rate 18, SpO2 99 %. Estimated body mass index is 26.21 kg/m as calculated from the following:   Height as of 10/19/21: '5\' 2"'$  (1.575 m).   Weight as of 10/19/21: 65 kg.  Physical Exam Constitutional:      Appearance: Normal appearance.  HENT:     Head: Normocephalic.      Mouth/Throat:     Mouth: Mucous membranes are moist.     Pharynx: Oropharynx is clear.  Eyes:     Extraocular Movements: Extraocular movements intact.     Conjunctiva/sclera: Conjunctivae normal.     Pupils: Pupils are equal, round, and reactive to light.  Cardiovascular:     Rate and Rhythm: Normal rate and regular rhythm.  Pulmonary:     Effort: Pulmonary effort is normal. No respiratory distress.  Abdominal:     General: Abdomen is flat.     Palpations: Abdomen is soft.  Musculoskeletal:        General: Normal range of motion.     Cervical back: Normal range of motion.  Skin:    General: Skin is warm and dry.  Neurological:     General: No focal deficit present.     Mental Status: She is alert and oriented to person, place, and time. Mental status is at baseline.     Assessment/Plan: Ms. Caserta reportedly had a seizure and struck her head on her nightstand. Neurology is following her for seizure management and have made recommendations. She appears to be at her  neurologic baseline. No Neurosurgical interventions recommended. Re-imaging only needed if the patient has a decline in mental status. No Neurosurgical outpatient follow up needed.    Viona Gilmore, DNP, AGNP-C Nurse Practitioner  [facility] [place] [address], [place], [place], [place] [address] P: [phone]    F: [phone]  01/06/2022, 9:22 AM

## 2022-01-06 NOTE — Progress Notes (Signed)
SLP Cancellation Note  Patient Details Name: [name] MRN: [id number] DOB: 08/19/1955   Cancelled treatment:       Clinical swallow evaluation orders received and appreciated.  Atempted to see pt at 950 and pt completing echo at beside.  On SLP return, RN confirmed pt has passed [place].  Pt now has die order entered and RN reports no concerns with PO intake.  Beside swallow evaluation not indicated at this time.  SLP will sign off.                                                                                                 Celedonio Savage, [place], [place] Office: [phone] 01/06/2022, 1:05 PM

## 2022-01-06 NOTE — Progress Notes (Signed)
  Echocardiogram 2D Echocardiogram has been performed.  [name] 01/06/2022, 10:22 AM

## 2022-01-06 NOTE — Progress Notes (Addendum)
Brief Neuro Update:  Patient picked up Vimpat from pharmacy 3 days ago.  Recommend increasing vimpat to '150mg'$  BID.  No driving for 6 months. Has to be seizure free before she can resume driving.  We will signoff.   [place] Pager Number [phone]

## 2022-01-06 NOTE — Progress Notes (Addendum)
Daily Progress Note Intern Pager: [phone]  Patient name: [name] Medical record number: [id number] Date of birth: 04/07/1956 Age: 66 y.o. Gender: female  Primary Care Provider: Bartholome Bill, MD Consultants: neurosurgery, neurology Code Status: FULL  Pt Overview and Major Events to Date:  9/28: Admit to FMTS   Assessment and Plan: [name] is a 66 y.o. female presenting with AMS  and head injury after a fall. Differential for this patient's presentation of this includes seizure, cardiogenic syncope, or brain trauma.  Past medical history includes recent new onset seizures, patient reports taking 3/5 of her new medications but she does not remember which ones. Her report of falling is typical of a seizure presentation. She reports new onset chest pain, it is unlikely that this is cardiogenic in nature due to normal EKG but warrants additional investigation. CT head showed two new brain bleeds unsure if this was caused before or after the fall.   * Syncope In the ED after traumatic fall from LOC resulting in head laceration.  CT head imaging revealed 2 areas of focal hemorrhage.  Neurosurgery consulted, reassessing this AM.  Differential for syncope includes seizure, cardiogenic, or brain focal hemorrhages.  With recent new onset seizures, unclear medicine administration at home, description of postictal state after fall, and incontinence during event; this is most likely due to to seizure. EEG no seizures. HR 55-60s.  - f/u orthostatic vitals - N.p.o. pending pass bedside swallow  - Fall precautions - f/u echo - History of QTc prolongation, if patient becomes nauseous consider Compazine  Bleeding in brain [facility]) On CT head 2 small areas of hemorrhagic contusions in the left frontal lobe were identified.  There is no evidence of subdural hemorrhage.  Negative for skull fracture.  Neurosurgery was consulted and recommended imaging if change in mental status but no  NSGY intervention at this time. Hgb stable.  - Neurosurgery consulted, appreciate recommendations - SCDs - Hold DVT prophylaxis given hemorrhage   Seizures ([place]) History of new onset seizure on 9/16 during her previous hospitalization.  She was discharged home on Vimpat 100 mg twice daily.  The patient is unsure if she has been taking her medications correctly and patient picked up medication from pharmacy 3 days ago. EEG no seizures. Given likely seizure and unsure medication compliance, neuro recommended increasing vimpat.  - Neurology following, appreciate recommendations - increase vimpat to '150mg'$  BID, continue IV for now pending ability to tolerate PO - seizure precaution: no driving for 6 months  - Ativan as needed for breakthrough seizures  Altered mental status Patient appears to be back at baseline.  She is alert and oriented x3.  History of schizoaffective disorder, bipolar type.  She reports being stable on her home medications.  Recent hospitalization for AMS patient was confused walking middle of the road.  This resulted in the patient having a seizure with intubation in the ICU.  UA concerning for UTI. Ammonia wnl. Patient's MOCA at prior hospitalization was 18/30 and suspect patient has impaired memory at baseline but she lives alone and takes care of her own medication. Psych consult in prior hospitalization indicated she has capacity. - Consult to neuro, appreciate recommendations - Continue home p.o. Zoloft and abilify, pending speech eval - Transitioned cefepime to keflex 500 BID for presumed UTI - Pending Ucx - f/u PT/OT recs - Could consider INR for concerns for hepatic encephalopathy, no known history of liver cirrhosis, historically negative for hep C, last ultrasound  right upper quadrant showing normal liver.  Chest pain New onset chest pain beginning in the ED.  No recent echo on file.  EKG within normal limits.  On exam she is regular rate, regular rhythm with no  murmurs.  This most likely this is due to her known GERD but with recent syncopal event we will need to rule out cardiogenic etiology. Troponin downtrending 33 > 19. No chest pain this AM. - f/u echo - AM BMP   Anemia Prior iron studies a month ago consistent with iron deficiency anemia. Will recheck ferritin and consider adding oral iron if decreased.  -check ferritin tomorrow AM and add oral iron if low  -will need colonscopy outpatient. Prior colonoscopy had adenomas.   UTI (urinary tract infection) UA concerning for possible infection.  Known penicillin allergy.  - f/u urine culture - continue cefepime IV, per pharmacy consult  Hypokalemia Hypomagnesemia  K 3.2>3.1 > 3.4.  9/29 EKG sinus rhythm, HR 60, QTc 464. - replete with additional K 40 mEq x1 - replete Mg x 1gm  - EKG NSR. QTc 464.  HTN (hypertension) Normotensive with some low diastolics intermittently. Home meds include lisinopril-HCTZ.  - hold home lisinopril-HCTZ - monitor BP   FEN/GI: NPO pending NSGY recs PPx: SCDs Dispo: Pending medical work-up    Subjective:  Patient is laying in bed. Reports mild headache. Reports previously improved with tylenol. She is a poor historian but reports she had just taken about 5 of her medications from discharge at last hospitalization and then started to feel like she was about to pass out. She reports she had not picked up vimpat from pharmacy. She is unsure what happened after taking her medications. She reports she lives alone and takes care of all her own medications. She denies chest pain, abdominal pain. Reports she had pain with urination.   Objective: Temp:  [97.4 F (36.3 C)-98.7 F (37.1 C)] 98.7 F (37.1 C) (09/29 0835) Pulse Rate:  [57-76] 67 (09/29 0830) Resp:  [13-27] 18 (09/29 0830) BP: (99-145)/(40-82) 145/61 (09/29 0830) SpO2:  [93 %-100 %] 99 % (09/29 0830)  Physical Exam: General: alert well-developed Caucasian female in no acute distress   Cardiovascular: RRR. No m/r/g. Respiratory: CTAB. Normal work of breathing. Abdomen: Soft, non-tender, non-distended Extremities: No peripheral edema  Neuro: alert and oriented to self, place, city, month, year. Not oriented to day of week. CN II-XII intact. 5/5 strength in BUE and BLE. FTN intact. Only able to recall 1/3 words.   Laboratory: Most recent CBC Lab Results  Component Value Date   WBC 8.1 01/06/2022   HGB 10.1 (L) 01/06/2022   HCT 30.5 (L) 01/06/2022   MCV 89.7 01/06/2022   PLT 279 01/06/2022   Most recent BMP    Latest Ref Rng & Units 01/06/2022    1:56 AM  BMP  Glucose 70 - 99 mg/dL 80   BUN 8 - 23 mg/dL 14   Creatinine 0.44 - 1.00 mg/dL 1.05   Sodium 135 - 145 mmol/L 136   Potassium 3.5 - 5.1 mmol/L 3.4   Chloride 98 - 111 mmol/L 105   CO2 22 - 32 mmol/L 19   Calcium 8.9 - 10.3 mg/dL 8.7    Troponin 19 UA +leukocytes, nitrites UDS +barbiturates  Ucx pending  EKG sinus rhythm, Qtc 464  Rolanda Lundborg, MD 01/06/2022, 9:20 AM  PGY-1, [place] Intern pager: [phone], text pages welcome Secure chat group [place]

## 2022-01-06 NOTE — Consult Note (Signed)
Neurology Consultation Reason for Consult: LOC Referring Physician: Andria Frames, W  CC: Loss of consciousness  History is obtained from: Patient  HPI: [name] is a 66 y.o. female with a history of schizoaffective disorder who recently presented to [facility] and was found to have seizures on EEG.  She was started on Vimpat with good response and was discharged on 9/26.  She states that she had three of the medications on discharge, but had to pick up several others.  One of them need to be ordered by the pharmacy and so she had not started yet, but she is not sure which medication this was.  She presents after a fall of unclear etiology.   Past Medical History:  Diagnosis Date   DDD (degenerative disc disease), lumbar    Depression    Diverticulosis    Dizziness    GERD (gastroesophageal reflux disease)    IBS (irritable bowel syndrome)    Internal hemorrhoid    2nd degree   Myocarditis ([place]) 2009   Osteoarthritis    Prolapsed internal hemorrhoids, grade 3 12/01/2015   Recurrent falls    Schizoaffective disorder (HCC)    Tubular adenoma of colon 08/2011     Family History  Problem Relation Age of Onset   Suicidality Father    Depression Father    Suicidality Sister    Depression Sister    Suicidality Cousin    Suicidality Other      Social History:  reports that she has been smoking cigarettes. She has a 19.00 pack-year smoking history. She has never used smokeless tobacco. She reports that she does not drink alcohol and does not use drugs.   Exam: Current vital signs: BP 104/68   Pulse (!) 57   Temp (!) 97.4 F (36.3 C) (Oral)   Resp 16   SpO2 94%  Vital signs in last 24 hours: Temp:  [97.4 F (36.3 C)-98.2 F (36.8 C)] 97.4 F (36.3 C) (09/29 0530) Pulse Rate:  [57-76] 57 (09/29 0530) Resp:  [13-27] 16 (09/29 0530) BP: (99-144)/(40-82) 104/68 (09/29 0530) SpO2:  [93 %-100 %] 94 % (09/29 0530)   Physical Exam  Constitutional: Appears  well-developed and well-nourished.   Neuro: Mental Status: Patient is awake, alert, oriented to person, place, month, year, and situation. She has significant difficulty giving a clear history  no signs of aphasia or neglect Cranial Nerves: II: Visual Fields are full. Pupils are equal, round, and reactive to light.   III,IV, VI: EOMI without ptosis or diploplia.  V: Facial sensation is symmetric to temperature VII: Facial movement is symmetric.  VIII: hearing is intact to voice X: Uvula elevates symmetrically XI: Shoulder shrug is symmetric. XII: tongue is midline without atrophy or fasciculations.  Motor: Tone is normal. Bulk is normal. 5/5 strength was present in all four extremities.  Sensory: Sensation is symmetric to light touch and temperature in the arms and legs. Cerebellar: FNF intact bilaterally      I have reviewed labs in epic and the results pertinent to this consultation are: Creatinine 1.05 Calcium 8.7 Magnesium 1.7  I have reviewed the images obtained: CT head-hemorrhagic contusion of the left frontal lobe  Impression: 66 year old female with a recent history of new onset seizures.  Certainly, seizure as an etiology of her fall I think is very possible.  Given her report that one of her medications is not in stock, and the fact that lacosamide is often not stocked in many pharmacies, I think there  is a good likelihood that she did not receive this.  I will ask pharmacy to look into whether this was one of the prescriptions that the patient had.  Clearly if she was not taking her Vimpat, the no changes would need to be made.  Recommendations: 1) pharmacy consult to confirm whether the patient had Vimpat or not 2) continue home Vimpat 100 twice daily 3) EEG is reasonable 4) contusions per neurosurgery 5) neurology will follow   Roland Rack, MD [facility] [phone]  If 7pm- 7am, please page neurology on call as listed in  [place].

## 2022-01-06 NOTE — Procedures (Signed)
Patient Name: [name]  MRN: [id number]  Epilepsy Attending: Lora Havens  Referring Physician/Provider: Zenia Resides, MD Date: 01/05/2022 Duration: 21.22 mins  Patient history: 66 year old female with a recent history of new onset seizures. EEG to evaluate for seizure  Level of alertness: Awake AEDs during EEG study:   Technical aspects: This EEG study was done with scalp electrodes positioned according to the 10-20 International system of electrode placement. Electrical activity was reviewed with band pass filter of 1-'70Hz'$ , sensitivity of 7 uV/mm, display speed of 47m/sec with a '60Hz'$  notched filter applied as appropriate. EEG data were recorded continuously and digitally stored.  Video monitoring was available and reviewed as appropriate.  Description: The posterior dominant rhythm consists of 9 Hz activity of moderate voltage (25-35 uV) seen predominantly in posterior head regions, symmetric and reactive to eye opening and eye closing.  Hyperventilation and photic stimulation were not performed.     IMPRESSION: This study is within normal limits. No seizures or epileptiform discharges were seen throughout the recording.  A normal interictal EEG does not exclude nor support the diagnosis of epilepsy.   [name]

## 2022-01-06 NOTE — Evaluation (Signed)
Physical Therapy Evaluation Patient Details Name: [name] MRN: [id number] DOB: [birth year] Today's Date: 01/06/2022  History of Present Illness  66 yo female was admitted after being found wandering near her home, with a L forehead laceration.  Hypotensive and had apparently fallen, with HA and no clear history of what had led to the situation.  Pt has clear EEG, L frontal lobe contusion but no surgery needed.  Does note chest pain, possibly had a seizure and had AMS.  Has UTI, AKI and low K+ and Magnesium.   PMHx:  seizures,  Clinical Impression  Pt was seen for short session due to her difficulty with chest pain and low endurance at time of eval.  Her plan is to ask for SNF care due to her living alone and dependence on wc now, having lost gait and standing endurance tolerance since last admission.  Pt report of PLOF and her actual findings from admission are not congruent.  Follow up with her to get strength back and recover gait and balance skills.  Her assist is limited at DC to needs to have a good base of strengthening to safely avoid a fall at home.  Encourage OOB to chair and standing to promote resumption of gait.     Recommendations for follow up therapy are one component of a multi-disciplinary discharge planning process, led by the attending physician.  Recommendations may be updated based on patient status, additional functional criteria and insurance authorization.  Follow Up Recommendations Skilled nursing-short term rehab (<3 hours/day) Can patient physically be transported by private vehicle: No    Assistance Recommended at Discharge Frequent or constant Supervision/Assistance  Patient can return home with the following  A lot of help with walking and/or transfers;A lot of help with bathing/dressing/bathroom;Assistance with cooking/housework;Assistance with feeding;Direct supervision/assist for medications management;Direct supervision/assist for financial management;Assist  for transportation;Help with stairs or ramp for entrance    Equipment Recommendations None recommended by PT  Recommendations for Other Services       Functional Status Assessment Patient has had a recent decline in their functional status and demonstrates the ability to make significant improvements in function in a reasonable and predictable amount of time.     Precautions / Restrictions Precautions Precautions: Fall Precaution Comments: monitor vitals Restrictions Weight Bearing Restrictions: No      Mobility  Bed Mobility Overal bed mobility: Needs Assistance Bed Mobility: Supine to Sit, Sit to Supine     Supine to sit: Mod assist Sit to supine: Mod assist   General bed mobility comments: pt noted chest pain upon sitting so did not go further    Transfers                   General transfer comment: did not stand due to chest pain    Ambulation/Gait               General Gait Details: hold due to chest pain  Stairs            Wheelchair Mobility    Modified Rankin (Stroke Patients Only)       Balance Overall balance assessment: Needs assistance Sitting-balance support: Feet supported, Single extremity supported Sitting balance-Leahy Scale: Poor Sitting balance - Comments: requires back support to stay up                                     Pertinent Vitals/Pain  Pain Assessment Pain Assessment: Faces Faces Pain Scale: Hurts a little bit Pain Location: chest pain Pain Descriptors / Indicators: Sore Pain Intervention(s): Monitored during session, Repositioned, Other (comment) (contacted nursing to ck on pt)    Home Living Family/patient expects to be discharged to:: Unsure                        Prior Function Prior Level of Function : Independent/Modified Independent             Mobility Comments: used WC for mobiltiy after self transferring to chair       Hand Dominance   Dominant Hand:  Right    Extremity/Trunk Assessment   Upper Extremity Assessment Upper Extremity Assessment: Generalized weakness    Lower Extremity Assessment Lower Extremity Assessment: Generalized weakness    Cervical / Trunk Assessment Cervical / Trunk Assessment: Normal  Communication   Communication: No difficulties  Cognition Arousal/Alertness: Awake/alert Behavior During Therapy: Flat affect Overall Cognitive Status: No family/caregiver present to determine baseline cognitive functioning                                 General Comments: unclear how close to baseline she is        General Comments General comments (skin integrity, edema, etc.): Pt was in bed, assisted to sit and ck of BP was 141/67.  Pt had controlled pulse and sats 99%, but reported onset of chest pain on upper L chest.  Stopped mobility and reported to nursing    Exercises     Assessment/Plan    PT Assessment Patient needs continued PT services  PT Problem List Decreased strength;Decreased balance;Decreased coordination;Decreased safety awareness;Cardiopulmonary status limiting activity       PT Treatment Interventions DME instruction;Gait training;Functional mobility training;Therapeutic activities;Therapeutic exercise;Balance training;Neuromuscular re-education;Patient/family education    PT Goals (Current goals can be found in the Care Plan section)  Acute Rehab PT Goals Patient Stated Goal: none stated PT Goal Formulation: Patient unable to participate in goal setting Time For Goal Achievement: 01/20/22 Potential to Achieve Goals: Good    Frequency Min 3X/week     Co-evaluation               AM-PAC PT "6 Clicks" Mobility  Outcome Measure Help needed turning from your back to your side while in a flat bed without using bedrails?: A Lot Help needed moving from lying on your back to sitting on the side of a flat bed without using bedrails?: A Lot Help needed moving to and from a  bed to a chair (including a wheelchair)?: A Lot Help needed standing up from a chair using your arms (e.g., wheelchair or bedside chair)?: Total Help needed to walk in hospital room?: Total Help needed climbing 3-5 steps with a railing? : Total 6 Click Score: 9    End of Session   Activity Tolerance: Patient limited by fatigue;Treatment limited secondary to medical complications (Comment) Patient left: in bed;with call bell/phone within reach Nurse Communication: Mobility status;Other (comment) (chest pain) PT Visit Diagnosis: Muscle weakness (generalized) (M62.81);Pain Pain - Right/Left: Left Pain - part of body:  (chest)    Time: 5053-9767 PT Time Calculation (min) (ACUTE ONLY): 17 min   Charges:   PT Evaluation $PT Eval Moderate Complexity: 1 Mod         Ramond Dial 01/06/2022, 2:53 PM  Mee Hives, PT PhD Acute Rehab  Dept. Number: [place] and [place]

## 2022-01-06 NOTE — ED Notes (Signed)
Pt with 2 episodes of diarrhea. Pt bedding, brief and purwick replaced.  Pt able to roll back and forth at assist RN with cleaning and changing.   Pt states she new she was going to have a BM but wasn't able to call for help in time.

## 2022-01-06 NOTE — Assessment & Plan Note (Signed)
Prior iron studies a month ago consistent with iron deficiency anemia. Will recheck ferritin and consider adding oral iron if decreased.  -ferritin pending, add oral iron if low  -will need colonscopy outpatient. Prior colonoscopy had adenomas.

## 2022-01-06 NOTE — ED Notes (Signed)
ED TO INPATIENT HANDOFF REPORT  ED Nurse Name and Phone #: [phone]  S Name/Age/Gender [name] 66 y.o. female Room/Bed: 020C/020C  Code Status   Code Status: Full Code  Home/SNF/Other Home Patient oriented to: self, place, time, and situation Is this baseline? Yes   Triage Complete: Triage complete  Chief Complaint Altered mental status [R41.82] AMS (altered mental status) [R41.82]  Triage Note BIB [facility] from neighborhood. DC yesterday from [facility]- unsure of reason. Found wandering 3 houses down from home. Pt reports she fell last night lac to left forehead- no thinners. Found to be hypotensive 80's/40's on scene. Answers orientation questions correctly but cannot say why she was wandering around. Psych and SZ HX. Complains of HA where she his it.  742m enroute BP improves to 106/55   Allergies Allergies  Allergen Reactions   Penicillins Rash          Sulfa Antibiotics Rash    Level of Care/Admitting Diagnosis ED Disposition     ED Disposition  Admit   Condition  --   Comment  [id number]  Level of Care: Telemetry Medical [104]  May [facility] patient to [facility] equivalent level of care is available:: Yes  Covid Evaluation: Asymptomatic - no recent exposure (last 10 days) testing not required  Diagnosis: AMS (altered mental status) [[[id number]] Admitting Physician: GDonney Dice[[[id number] Attending Physician: PLenoria Chime[[[id number] Certification:: I certify this patient will need inpatient services for at least 2 midnights          B Medical/Surgery History Past Medical History:  Diagnosis Date   DDD (degenerative disc disease), lumbar    Depression    Diverticulosis    Dizziness    GERD (gastroesophageal reflux disease)    IBS (irritable bowel syndrome)    Internal hemorrhoid    2nd degree   [place]ocard[place]   Osteoarthritis    Prolapsed internal  hemorrhoids, grade 3 12/01/2015   Recurrent falls    [place]    Tubular adenoma of colon 08/2011   Past Surgical History:  Procedure Laterality Date   BIOPSY  10/19/2021   Procedure: BIOPSY;  Surgeon: MClarene Essex MD;  [facility];  Service: Gastroenterology;;   BREAST BIOPSY Right 12/27/2012   CHOLECYSTECTOMY     ESOPHAGOGASTRODUODENOSCOPY (EGD) WITH PROPOFOL N/A 10/19/2021   Procedure: ESOPHAGOGASTRODUODENOSCOPY (EGD) WITH PROPOFOL;  Surgeon: MClarene Essex MD;  [facility];  Service: Gastroenterology;  Laterality: N/A;   FOREIGN BODY REMOVAL  10/19/2021   Procedure: FOREIGN BODY REMOVAL;  Surgeon: MClarene Essex MD;  [facility];  Service: Gastroenterology;;   HEMORRHOID BANDING     INTRAMEDULLARY (IM) NAIL INTERTROCHANTERIC Left 01/24/2020   Procedure: INTRAMEDULLARY (IM) NAIL INTERTROCHANTRIC;  Surgeon: XLeandrew Koyanagi MD;  Location[facility];  Service: Orthopedics;  Laterality: Left;     A IV Location/Drains/Wounds Patient Lines/Drains/Airways Status     Active Line/Drains/Airways     Name Placement date Placement time Site Days   Peripheral IV 01/05/22 22 G Anterior;Left Hand 01/05/22  1650  Hand  1   Peripheral IV 01/05/22 20 G Anterior;Distal;Right;Upper Arm 01/05/22  2335  Arm  1   Incision (Closed) 01/24/20 Hip 01/24/20  0910  -- 713            Intake/Output Last 24 hours  Intake/Output Summary (Last 24 hours) at 01/06/2022 1122 Last data filed at 01/06/2022 01324Gross  per 24 hour  Intake 1227.95 ml  Output 400 ml  Net 827.95 ml    Labs/Imaging Results for orders placed or performed during the hospital encounter of 01/05/22 (from the past 48 hour(s))  Comprehensive metabolic panel     Status: Abnormal   Collection Time: 01/05/22  4:06 PM  Result Value Ref Range   Sodium 134 (L) 135 - 145 mmol/L   Potassium 3.1 (L) 3.5 - 5.1 mmol/L   Chloride 110 98 - 111 mmol/L   CO2 17 (L) 22 - 32 mmol/L   Glucose, Bld 78  70 - 99 mg/dL    Comment: Glucose reference range applies only to samples taken after fasting for at least 8 hours.   BUN 18 8 - 23 mg/dL   Creatinine, Ser 1.61 (H) 0.44 - 1.00 mg/dL   Calcium 7.4 (L) 8.9 - 10.3 mg/dL   Total Protein 5.5 (L) 6.5 - 8.1 g/dL   Albumin 2.7 (L) 3.5 - 5.0 g/dL   AST 28 15 - 41 U/L   ALT 14 0 - 44 U/L   Alkaline Phosphatase 56 38 - 126 U/L   Total Bilirubin 0.5 0.3 - 1.2 mg/dL   GFR, Estimated 35 (L) >60 mL/min    Comment: (NOTE) Calculated using the CKD-EPI Creatinine Equation (2021)    Anion gap 7 5 - 15    Comment: Performed at [facility], [place] [address] [place], [place] [address]  Ethanol     Status: None   Collection Time: 01/05/22  4:06 PM  Result Value Ref Range   Alcohol, Ethyl (B) <10 <10 mg/dL    Comment: (NOTE) Lowest detectable limit for serum alcohol is 10 mg/dL.  For medical purposes only. Performed at [facility], [place] [address], [place], [place] [address]   CBC with Differential     Status: Abnormal   Collection Time: 01/05/22  4:06 PM  Result Value Ref Range   WBC 7.9 4.0 - 10.5 K/uL   RBC 3.78 (L) 3.87 - 5.11 MIL/uL   Hemoglobin 11.0 (L) 12.0 - 15.0 g/dL   HCT 34.7 (L) 36.0 - 46.0 %   MCV 91.8 80.0 - 100.0 fL   MCH 29.1 26.0 - 34.0 pg   MCHC 31.7 30.0 - 36.0 g/dL   RDW 15.8 (H) 11.5 - 15.5 %   Platelets 270 150 - 400 K/uL   nRBC 0.0 0.0 - 0.2 %   Neutrophils Relative % 69 %   Neutro Abs 5.4 1.7 - 7.7 K/uL   Lymphocytes Relative 20 %   Lymphs Abs 1.6 0.7 - 4.0 K/uL   Monocytes Relative 8 %   Monocytes Absolute 0.6 0.1 - 1.0 K/uL   Eosinophils Relative 1 %   Eosinophils Absolute 0.1 0.0 - 0.5 K/uL   Basophils Relative 1 %   Basophils Absolute 0.1 0.0 - 0.1 K/uL   Immature Granulocytes 1 %   Abs Immature Granulocytes 0.04 0.00 - 0.07 K/uL    Comment: Performed at [facility] [address], [place],  [address]  I-stat chem 8, ED     Status: Abnormal   Collection Time: 01/05/22  4:33 PM   Result Value Ref Range   Sodium 135 135 - 145 mmol/L   Potassium 3.2 (L) 3.5 - 5.1 mmol/L   Chloride 107 98 - 111 mmol/L   BUN 19 8 - 23 mg/dL   Creatinine, Ser 1.70 (H) 0.44 - 1.00 mg/dL   Glucose, Bld 84  70 - 99 mg/dL    Comment: Glucose reference range applies only to samples taken after fasting for at least 8 hours.   Calcium, Ion 0.91 (L) 1.15 - 1.40 mmol/L   TCO2 20 (L) 22 - 32 mmol/L   Hemoglobin 9.9 (L) 12.0 - 15.0 g/dL   HCT 29.0 (L) 36.0 - 46.0 %  Urinalysis, Routine w reflex microscopic     Status: Abnormal   Collection Time: 01/05/22  9:21 PM  Result Value Ref Range   Color, Urine YELLOW YELLOW   APPearance HAZY (A) CLEAR   Specific Gravity, Urine 1.012 1.005 - 1.030   pH 5.0 5.0 - 8.0   Glucose, UA NEGATIVE NEGATIVE mg/dL   Hgb urine dipstick NEGATIVE NEGATIVE   Bilirubin Urine NEGATIVE NEGATIVE   Ketones, ur 5 (A) NEGATIVE mg/dL   Protein, ur NEGATIVE NEGATIVE mg/dL   Nitrite POSITIVE (A) NEGATIVE   Leukocytes,Ua LARGE (A) NEGATIVE   RBC / HPF 0-5 0 - 5 RBC/hpf   WBC, UA 21-50 0 - 5 WBC/hpf   Bacteria, UA MANY (A) NONE SEEN   Squamous Epithelial / LPF 0-5 0 - 5   Non Squamous Epithelial 0-5 (A) NONE SEEN    Comment: Performed at [facility], [address], [place], [place] [address]  Rapid urine drug screen (hospital performed)     Status: Abnormal   Collection Time: 01/05/22  9:21 PM  Result Value Ref Range   Opiates NONE DETECTED NONE DETECTED   Cocaine NONE DETECTED NONE DETECTED   Benzodiazepines NONE DETECTED NONE DETECTED   Amphetamines NONE DETECTED NONE DETECTED   Tetrahydrocannabinol NONE DETECTED NONE DETECTED   Barbiturates POSITIVE (A) NONE DETECTED    Comment: (NOTE) DRUG SCREEN FOR MEDICAL PURPOSES ONLY.  IF CONFIRMATION IS NEEDED FOR ANY PURPOSE, NOTIFY LAB WITHIN 5 DAYS.  LOWEST DETECTABLE LIMITS FOR URINE DRUG SCREEN Drug Class                     Cutoff (ng/mL) Amphetamine and metabolites    1000 Barbiturate and metabolites     200 Benzodiazepine                 540 Tricyclics and metabolites     300 Opiates and metabolites        300 Cocaine and metabolites        300 THC                            50 Performed at [facility], [place] [address] [place], [place] [address]   Troponin I (High Sensitivity)     Status: Abnormal   Collection Time: 01/05/22 11:10 PM  Result Value Ref Range   Troponin I (High Sensitivity) 33 (H) <18 ng/L    Comment: (NOTE) Elevated high sensitivity troponin I (hsTnI) values and significant  changes across serial measurements may suggest ACS but many other  chronic and acute conditions are known to elevate hsTnI results.  Refer to the "Links" section for chest pain algorithms and additional  guidance. Performed at [facility], [place] [address] [place], [place] [address]   Comprehensive metabolic panel     Status: Abnormal   Collection Time: 01/06/22  1:56 AM  Result Value Ref Range   Sodium 136 135 - 145 mmol/L   Potassium 3.4 (L) 3.5 - 5.1 mmol/L   Chloride 105 98 - 111 mmol/L  CO2 19 (L) 22 - 32 mmol/L   Glucose, Bld 80 70 - 99 mg/dL    Comment: Glucose reference range applies only to samples taken after fasting for at least 8 hours.   BUN 14 8 - 23 mg/dL   Creatinine, Ser 1.05 (H) 0.44 - 1.00 mg/dL   Calcium 8.7 (L) 8.9 - 10.3 mg/dL   Total Protein 5.6 (L) 6.5 - 8.1 g/dL   Albumin 2.7 (L) 3.5 - 5.0 g/dL   AST 28 15 - 41 U/L   ALT 16 0 - 44 U/L   Alkaline Phosphatase 56 38 - 126 U/L   Total Bilirubin 0.8 0.3 - 1.2 mg/dL   GFR, Estimated 59 (L) >60 mL/min    Comment: (NOTE) Calculated using the CKD-EPI Creatinine Equation (2021)    Anion gap 12 5 - 15    Comment: Performed at [facility], [place] [address] [place], [place] [address]  CBC     Status: Abnormal   Collection Time: 01/06/22  1:56 AM  Result Value Ref Range   WBC 8.1 4.0 - 10.5 K/uL   RBC 3.40 (L) 3.87 - 5.11 MIL/uL   Hemoglobin 10.1 (L) 12.0 - 15.0 g/dL   HCT 30.5 (L) 36.0 - 46.0 %    MCV 89.7 80.0 - 100.0 fL   MCH 29.7 26.0 - 34.0 pg   MCHC 33.1 30.0 - 36.0 g/dL   RDW 15.9 (H) 11.5 - 15.5 %   Platelets 279 150 - 400 K/uL   nRBC 0.0 0.0 - 0.2 %    Comment: Performed at [facility], [place] [address] [place], [place] [address]  Magnesium     Status: None   Collection Time: 01/06/22  1:56 AM  Result Value Ref Range   Magnesium 1.7 1.7 - 2.4 mg/dL    Comment: Performed at [facility] [address], [place], [place] [address]  Ammonia     Status: None   Collection Time: 01/06/22  1:57 AM  Result Value Ref Range   Ammonia 17 9 - 35 umol/L    Comment: Performed at [facility], [place] [address], [place], [place] [address]  Troponin I (High Sensitivity)     Status: Abnormal   Collection Time: 01/06/22  5:57 AM  Result Value Ref Range   Troponin I (High Sensitivity) 19 (H) <18 ng/L    Comment: (NOTE) Elevated high sensitivity troponin I (hsTnI) values and significant  changes across serial measurements may suggest ACS but many other  chronic and acute conditions are known to elevate hsTnI results.  Refer to the "Links" section for chest pain algorithms and additional  guidance. Performed at [facility], [place] [address] [place], [place] [address]    ECHOCARDIOGRAM COMPLETE  Result Date: 01/06/2022    ECHOCARDIOGRAM REPORT   Patient Name:   KALEEYA HANCOCK [facility] Date of Exam: 01/06/2022 Medical Rec #:  [id number]        Height:       62.0 in Accession #:    [id number]       Weight:       143.3 lb Date of Birth:  [birth year]         BSA:          1.659 m Patient Age:    69 years         BP:           142/74 mmHg Patient Gender: F  HR:           54 bpm. Exam Location:  Inpatient Procedure: Color Doppler, Cardiac Doppler, 2D Echo and 3D Echo Indications:    Syncope  History:        Patient has prior history of Echocardiogram examinations, most                 recent 12/12/2013. Myocarditis; Signs/Symptoms:Chest Pain.                 Seizures.   Sonographer:    Eartha Inch Referring Phys: Princella Ion A HENSEL  Sonographer Comments: Image acquisition challenging due to respiratory motion and Image acquisition challenging due to patient body habitus. IMPRESSIONS  1. Left ventricular ejection fraction, by estimation, is 60 to 65%. The left ventricle has normal function. The left ventricle has no regional wall motion abnormalities. Left ventricular diastolic parameters were normal.  2. Right ventricular systolic function is normal. The right ventricular size is normal. There is normal pulmonary artery systolic pressure.  3. The mitral valve is normal in structure. Trivial mitral valve regurgitation. No evidence of mitral stenosis.  4. The aortic valve is tricuspid. Aortic valve regurgitation is not visualized. No aortic stenosis is present.  5. The inferior vena cava is normal in size with greater than 50% respiratory variability, suggesting right atrial pressure of 3 mmHg. FINDINGS  Left Ventricle: Left ventricular ejection fraction, by estimation, is 60 to 65%. The left ventricle has normal function. The left ventricle has no regional wall motion abnormalities. The left ventricular internal cavity size was normal in size. There is  no left ventricular hypertrophy. Left ventricular diastolic parameters were normal. Right Ventricle: The right ventricular size is normal. Right ventricular systolic function is normal. There is normal pulmonary artery systolic pressure. The tricuspid regurgitant velocity is 2.43 m/s, and with an assumed right atrial pressure of 3 mmHg,  the estimated right ventricular systolic pressure is 79.0 mmHg. Left Atrium: Left atrial size was normal in size. Right Atrium: Right atrial size was normal in size. Pericardium: There is no evidence of pericardial effusion. Mitral Valve: The mitral valve is normal in structure. Trivial mitral valve regurgitation. No evidence of mitral valve stenosis. MV peak gradient, 4.7 mmHg. The mean  mitral valve gradient is 2.0 mmHg. Tricuspid Valve: The tricuspid valve is normal in structure. Tricuspid valve regurgitation is mild . No evidence of tricuspid stenosis. Aortic Valve: The aortic valve is tricuspid. Aortic valve regurgitation is not visualized. No aortic stenosis is present. Pulmonic Valve: The pulmonic valve was normal in structure. Pulmonic valve regurgitation is trivial. No evidence of pulmonic stenosis. Aorta: The aortic root is normal in size and structure. Venous: The inferior vena cava is normal in size with greater than 50% respiratory variability, suggesting right atrial pressure of 3 mmHg. IAS/Shunts: The interatrial septum appears to be lipomatous. No atrial level shunt detected by color flow Doppler.  LEFT VENTRICLE PLAX 2D LVIDd:         4.40 cm   Diastology LVIDs:         2.80 cm   LV e' medial:    6.53 cm/s LV PW:         0.90 cm   LV E/e' medial:  14.7 LV IVS:        0.70 cm   LV e' lateral:   7.72 cm/s LVOT diam:     1.80 cm   LV E/e' lateral: 12.5 LV SV:  64 LV SV Index:   38 LVOT Area:     2.54 cm                           3D Volume EF:                          3D EF:        59 %                          LV EDV:       120 ml                          LV ESV:       49 ml                          LV SV:        71 ml RIGHT VENTRICLE RV S prime:     12.10 cm/s TAPSE (M-mode): 1.7 cm LEFT ATRIUM             Index        RIGHT ATRIUM           Index LA diam:        3.50 cm 2.11 cm/m   RA Area:     14.40 cm LA Vol (A2C):   26.3 ml 15.85 ml/m  RA Volume:   36.40 ml  21.94 ml/m LA Vol (A4C):   30.8 ml 18.56 ml/m LA Biplane Vol: 29.1 ml 17.54 ml/m  AORTIC VALVE             PULMONIC VALVE LVOT Vmax:   134.00 cm/s PR End Diast Vel: 3.34 msec LVOT Vmean:  88.500 cm/s LVOT VTI:    0.251 m  AORTA Ao Root diam: 2.90 cm Ao Asc diam:  3.50 cm MITRAL VALVE               TRICUSPID VALVE MV Area (PHT): 3.08 cm    TR Peak grad:   23.6 mmHg MV Area VTI:   1.71 cm    TR Mean grad:   18.0  mmHg MV Peak grad:  4.7 mmHg    TR Vmax:        243.00 cm/s MV Mean grad:  2.0 mmHg    TR Vmean:       204.0 cm/s MV Vmax:       1.08 m/s MV Vmean:      61.5 cm/s   SHUNTS MV Decel Time: 246 msec    Systemic VTI:  0.25 m MV E velocity: 96.20 cm/s  Systemic Diam: 1.80 cm MV A velocity: 94.40 cm/s MV E/A ratio:  1.02 Kirk Ruths MD Electronically signed by Kirk Ruths MD Signature Date/Time: 01/06/2022/11:02:15 AM    Final    EEG adult  Result Date: 01/06/2022 Lora Havens, MD     01/06/2022  7:53 AM Patient Name: CACI ORREN MRN: [id number] Epilepsy Attending: Lora Havens Referring Physician/Provider: Zenia Resides, MD Date: 01/05/2022 Duration: 21.22 mins Patient history: 66 year old female with a recent history of new onset seizures. EEG to evaluate for seizure Level of alertness: Awake AEDs during EEG study: Technical aspects: This EEG study was done with scalp electrodes positioned according to the 10-20 International system of electrode placement. Dealer  activity was reviewed with band pass filter of 1-'70Hz'$ , sensitivity of 7 uV/mm, display speed of 65m/sec with a '60Hz'$  notched filter applied as appropriate. EEG data were recorded continuously and digitally stored.  Video monitoring was available and reviewed as appropriate. Description: The posterior dominant rhythm consists of 9 Hz activity of moderate voltage (25-35 uV) seen predominantly in posterior head regions, symmetric and reactive to eye opening and eye closing.  Hyperventilation and photic stimulation were not performed.   IMPRESSION: This study is within normal limits. No seizures or epileptiform discharges were seen throughout the recording. A normal interictal EEG does not exclude nor support the diagnosis of epilepsy. PLora Havens  CT Head Wo Contrast  Result Date: 01/05/2022 CLINICAL DATA:  Head trauma.  Left frontal laceration. EXAM: CT HEAD WITHOUT CONTRAST CT CERVICAL SPINE WITHOUT CONTRAST TECHNIQUE:  Multidetector CT imaging of the head and cervical spine was performed following the standard protocol without intravenous contrast. Multiplanar CT image reconstructions of the cervical spine were also generated. RADIATION DOSE REDUCTION: This exam was performed according to the departmental dose-optimization program which includes automated exposure control, adjustment of the mA and/or kV according to patient size and/or use of iterative reconstruction technique. COMPARISON:  CT head 09/18/2021 FINDINGS: CT HEAD FINDINGS Brain: 3 x 6 mm hemorrhage in the left frontal lobe compatible with hemorrhagic contusion. 15 mm area of hemorrhage in the left frontal convexity also compatible with hemorrhagic contusion. No subdural hemorrhage identified. Ventricle size and cerebral volume within normal limits for age. Mild white matter hypodensity bilaterally similar to the prior study and consistent with chronic microvascular ischemia. No acute infarct or mass. Vascular: Negative for hyperdense vessel Skull: Negative for skull fracture. Left lateral frontal scalp laceration. Sinuses/Orbits: Paranasal sinuses clear.  Negative orbit Other: None CT CERVICAL SPINE FINDINGS Alignment: Normal Skull base and vertebrae: Negative for fracture Soft tissues and spinal canal: Negative for soft tissue mass or edema Disc levels: Moderate spondylosis. Spinal and foraminal stenosis at C4-5 C5-6 and C6-7 due to spurring Upper chest: Lung apices clear bilaterally Other: None IMPRESSION: 1. Two small areas of hemorrhagic contusion in the left frontal lobe. No subdural hemorrhage. 2. Left frontal scalp laceration. Negative for skull fracture. 3. Cervical spondylosis. Negative for cervical spine fracture. 4. These results were called by telephone at the time of interpretation on 01/05/2022 at 5:54 pm to provider MELANIE BELFI , who verbally acknowledged these results. Electronically Signed   By: CFranchot GalloM.D.   On: 01/05/2022 17:55   CT  Cervical Spine Wo Contrast  Result Date: 01/05/2022 CLINICAL DATA:  Head trauma.  Left frontal laceration. EXAM: CT HEAD WITHOUT CONTRAST CT CERVICAL SPINE WITHOUT CONTRAST TECHNIQUE: Multidetector CT imaging of the head and cervical spine was performed following the standard protocol without intravenous contrast. Multiplanar CT image reconstructions of the cervical spine were also generated. RADIATION DOSE REDUCTION: This exam was performed according to the departmental dose-optimization program which includes automated exposure control, adjustment of the mA and/or kV according to patient size and/or use of iterative reconstruction technique. COMPARISON:  CT head 09/18/2021 FINDINGS: CT HEAD FINDINGS Brain: 3 x 6 mm hemorrhage in the left frontal lobe compatible with hemorrhagic contusion. 15 mm area of hemorrhage in the left frontal convexity also compatible with hemorrhagic contusion. No subdural hemorrhage identified. Ventricle size and cerebral volume within normal limits for age. Mild white matter hypodensity bilaterally similar to the prior study and consistent with chronic microvascular ischemia. No acute infarct or mass. Vascular:  Negative for hyperdense vessel Skull: Negative for skull fracture. Left lateral frontal scalp laceration. Sinuses/Orbits: Paranasal sinuses clear.  Negative orbit Other: None CT CERVICAL SPINE FINDINGS Alignment: Normal Skull base and vertebrae: Negative for fracture Soft tissues and spinal canal: Negative for soft tissue mass or edema Disc levels: Moderate spondylosis. Spinal and foraminal stenosis at C4-5 C5-6 and C6-7 due to spurring Upper chest: Lung apices clear bilaterally Other: None IMPRESSION: 1. Two small areas of hemorrhagic contusion in the left frontal lobe. No subdural hemorrhage. 2. Left frontal scalp laceration. Negative for skull fracture. 3. Cervical spondylosis. Negative for cervical spine fracture. 4. These results were called by telephone at the time of  interpretation on 01/05/2022 at 5:54 pm to provider MELANIE BELFI , who verbally acknowledged these results. Electronically Signed   By: Franchot Gallo M.D.   On: 01/05/2022 17:55    Pending Labs Unresulted Labs (From admission, onward)     Start     Ordered   01/07/22 0500  CBC  Tomorrow morning,   R        01/06/22 0757   01/07/22 6546  Basic metabolic panel  Tomorrow morning,   R        01/06/22 0757   01/07/22 0500  Ferritin  Tomorrow morning,   R        01/06/22 1059   01/05/22 2154  Urine Culture  (Urine Culture)  Once,   R       Question:  Indication  Answer:  Altered mental status (if no other cause identified)   01/05/22 2153            Vitals/Pain Today's Vitals   01/06/22 0900 01/06/22 0930 01/06/22 1000 01/06/22 1100  BP: (!) 117/53 (!) 142/74 102/74 (!) 122/53  Pulse: (!) 56 (!) 54 (!) 56 (!) 55  Resp: '15 17 14 17  '$ Temp:      TempSrc:      SpO2: 99% 100% 100% 99%    Isolation Precautions No active isolations  Medications Medications  0.9 %  sodium chloride infusion (0 mLs Intravenous Stopped 01/06/22 0511)  LORazepam (ATIVAN) injection 4 mg (has no administration in time range)  ARIPiprazole (ABILIFY) tablet 4 mg (4 mg Oral Given 01/06/22 1024)  sertraline (ZOLOFT) tablet 100 mg (100 mg Oral Given 01/06/22 1023)  acetaminophen (TYLENOL) tablet 650 mg (has no administration in time range)  magnesium sulfate IVPB 1 g 100 mL (1 g Intravenous New Bag/Given 01/06/22 1027)  lacosamide (VIMPAT) 150 mg in sodium chloride 0.9 % 25 mL IVPB (has no administration in time range)  cephALEXin (KEFLEX) capsule 500 mg (has no administration in time range)  lidocaine-EPINEPHrine-tetracaine (LET) topical gel (3 mLs Topical Given by Other 01/05/22 1900)  lidocaine-EPINEPHrine (XYLOCAINE W/EPI) 2 %-1:200000 (PF) injection 10 mL (10 mLs Infiltration Given by Other 01/05/22 1900)  sodium chloride 0.9 % bolus 1,000 mL (0 mLs Intravenous Stopped 01/06/22 0020)  potassium chloride 10 mEq  in 100 mL IVPB (0 mEq Intravenous Stopped 01/06/22 0425)  potassium chloride SA (KLOR-CON M) CR tablet 40 mEq (40 mEq Oral Given 01/06/22 1024)    Mobility walks High fall risk   Focused Assessments     R Recommendations: See Admitting Provider Note  Report given to:   Additional Notes:

## 2022-01-06 NOTE — Progress Notes (Signed)
PT Cancellation Note  Patient Details Name: [name] MRN: [id number] DOB: [birth year]   Cancelled Treatment:    Reason Eval/Treat Not Completed: Patient at procedure or test/unavailable.  Hold for magnesium, retry as time and pt allow.   Ramond Dial 01/06/2022, 10:51 AM  Mee Hives, PT PhD Acute Rehab Dept. Number: [place] and [place]

## 2022-01-07 ENCOUNTER — Encounter (HOSPITAL_COMMUNITY): Payer: Self-pay | Admitting: Family Medicine

## 2022-01-07 DIAGNOSIS — R55 Syncope and collapse: Secondary | ICD-10-CM | POA: Diagnosis not present

## 2022-01-07 LAB — CBC
HCT: 30 % — ABNORMAL LOW (ref 36.0–46.0)
Hemoglobin: 10.2 g/dL — ABNORMAL LOW (ref 12.0–15.0)
MCH: 29.6 pg (ref 26.0–34.0)
MCHC: 34 g/dL (ref 30.0–36.0)
MCV: 87 fL (ref 80.0–100.0)
Platelets: 294 10*3/uL (ref 150–400)
RBC: 3.45 MIL/uL — ABNORMAL LOW (ref 3.87–5.11)
RDW: 15.6 % — ABNORMAL HIGH (ref 11.5–15.5)
WBC: 6.6 10*3/uL (ref 4.0–10.5)
nRBC: 0 % (ref 0.0–0.2)

## 2022-01-07 LAB — URINE CULTURE: Culture: >=100000 — AB

## 2022-01-07 LAB — BASIC METABOLIC PANEL
Anion gap: 9 (ref 5–15)
BUN: 9 mg/dL (ref 8–23)
CO2: 21 mmol/L — ABNORMAL LOW (ref 22–32)
Calcium: 9.3 mg/dL (ref 8.9–10.3)
Chloride: 107 mmol/L (ref 98–111)
Creatinine, Ser: 0.78 mg/dL (ref 0.44–1.00)
GFR, Estimated: >60 mL/min (ref >60)
Glucose, Bld: 78 mg/dL (ref 70–99)
Potassium: 4 mmol/L (ref 3.5–5.1)
Sodium: 137 mmol/L (ref 135–145)

## 2022-01-07 LAB — FERRITIN: Ferritin: 46 ng/mL (ref 11–307)

## 2022-01-07 MED ORDER — LACOSAMIDE 50 MG PO TABS
150.0000 mg | ORAL_TABLET | Freq: Two times a day (BID) | ORAL | Status: DC
  Administered 2022-01-07 – 2022-01-13 (×12): 150 mg via ORAL
  Filled 2022-01-07 (×12): qty 3

## 2022-01-07 MED ORDER — INFLUENZA VAC A&B SA ADJ QUAD 0.5 ML IM PRSY
0.5000 mL | PREFILLED_SYRINGE | INTRAMUSCULAR | Status: AC
  Administered 2022-01-08: 0.5 mL via INTRAMUSCULAR
  Filled 2022-01-07: qty 0.5

## 2022-01-07 MED ORDER — ORAL CARE MOUTH RINSE: 15.0000 mL | OROMUCOSAL | Status: DC | PRN

## 2022-01-07 MED ORDER — LORAZEPAM 2 MG/ML IJ SOLN: 4.0000 mg | INTRAMUSCULAR | Status: DC | PRN

## 2022-01-07 NOTE — Progress Notes (Signed)
Daily Progress Note Intern Pager: [phone]  Patient name: [name] Medical record number: [id number] Date of birth: 13-Jan-1956 Age: 66 y.o. Gender: female  Primary Care Provider: Bartholome Bill, MD Consultants: neurology, neurosurgery  Code Status: full code  Pt Overview and Major Events to Date:  8/28: Admitted to FMTS  Assessment and Plan:  [name] is a 66 y.o. female presenting with AMS  and head injury after a fall. Differential for this patient's presentation of this includes seizure, cardiogenic syncope, or brain trauma.  Past medical history includes recent new onset seizures, patient reports taking 3/5 of her new medications but she does not remember which ones. Her report of falling is typical of a seizure presentation. She reports new onset chest pain, it is unlikely that this is cardiogenic in nature due to normal EKG but warrants additional investigation. CT head showed two new brain bleeds unsure if this was caused before or after the fall.   * Syncope S/P traumatic fall from LOC resulting in head laceration.  CT head imaging: 2 areas of focal hemorrhage.  Neurosurgery consulted, with no further intervention recommended.  EEG no seizures. HR 55-60s.  - f/u orthostatic vitals - Fall precautions - echo: EF 60-65%, no wall motion abnormalities, no valve disease  - History of QTc prolongation, if patient becomes nauseous consider Compazine  Seizures (HCC) Stable. No seizure activity during this hospitalization. She was d/c last admission on Vimpat 100 mg twice daily, but patient didn't pick up her medication until 4 days ago.  - increase vimpat to '150mg'$  BID PO - seizure precaution: no driving for 6 months  - Ativan as needed for breakthrough seizures  Bleeding in brain (HCC) Stable. CT head 2 small areas of hemorrhagic contusions in the left frontal lobe.   Negative for skull fracture.  Neurosurgery was consulted and recommended imaging if change in  mental status but no intervention. Hgb stable.  - Neurosurgery s/o - SCDs - Hold DVT prophylaxis given hemorrhage   Altered mental status Resolved. Stable.  - cont delirium precautions   Chest pain Resolved. Echo WNL, troponin neg, EKG stable. No further workup indicated at this time.   Anemia Prior iron studies a month ago consistent with iron deficiency anemia. Will recheck ferritin and consider adding oral iron if decreased.  -ferritin pending, add oral iron if low  -will need colonscopy outpatient. Prior colonoscopy had adenomas.   UTI (urinary tract infection) UA concerning for possible infection.  Known penicillin allergy.  - f/u urine culture - cont Keflex 500 mg BID  Hypokalemia Hypomagnesemia  K 3.2>3.1 > 3.4.  9/29 EKG sinus rhythm, HR 60, QTc 464. - s/p K 40 mEq x1 - s/p Mg x 1gm  - morning BMP pending   HTN (hypertension) Normotensive with some low diastolics intermittently. Home meds include lisinopril-HCTZ.  - hold home lisinopril-HCTZ - monitor BP   FEN/GI: Regular PPx: SCDs Dispo:Pending PT recommendations  . Barriers include none.   Subjective:  Resting comfortably, no acute events overnight. She is feeling well with no new complaints or concerns. Medically stable for d/c pending PT/OT recommendations.   Objective: Temp:  [97.9 F (36.6 C)-99.2 F (37.3 C)] 98.7 F (37.1 C) (09/30 0417) Pulse Rate:  [54-77] 66 (09/30 0417) Resp:  [13-24] 19 (09/30 0417) BP: (102-145)/(44-74) 118/47 (09/30 0417) SpO2:  [94 %-100 %] 97 % (09/30 0417) Weight:  [52.6 kg-65 kg] 52.6 kg (09/29 1330) Physical Exam: General: frail appearing, no acute  distress  Cardiovascular: regular rate, regular rhythm, no murmurs Respiratory: clear, no wheezing, no consolidations. No increased work of breathing  Abdomen: soft, non-tender non-distended  Extremities: no peripheral edema, no signs of DVT  Laboratory: Most recent CBC Lab Results  Component Value Date   WBC 8.1  01/06/2022   HGB 10.1 (L) 01/06/2022   HCT 30.5 (L) 01/06/2022   MCV 89.7 01/06/2022   PLT 279 01/06/2022   Most recent BMP    Latest Ref Rng & Units 01/06/2022    1:56 AM  BMP  Glucose 70 - 99 mg/dL 80   BUN 8 - 23 mg/dL 14   Creatinine 0.44 - 1.00 mg/dL 1.05   Sodium 135 - 145 mmol/L 136   Potassium 3.5 - 5.1 mmol/L 3.4   Chloride 98 - 111 mmol/L 105   CO2 22 - 32 mmol/L 19   Calcium 8.9 - 10.3 mg/dL 8.7    Joyce Current, DO 01/07/2022, 5:48 AM  PGY-1, [place] Intern pager: [phone], text pages welcome Secure chat group [place]

## 2022-01-07 NOTE — Evaluation (Signed)
Occupational Therapy Evaluation Patient Details Name: [name] MRN: [id number] DOB: 11-25-1955 Today's Date: 01/07/2022   History of Present Illness 66 yo female was admitted after being found wandering near her home, with a L forehead laceration.  Hypotensive and had apparently fallen, with HA and no clear history of what had led to the situation.  Pt has clear EEG, L frontal lobe contusion but no surgery needed.  Does note chest pain, possibly had a seizure and had AMS.  Has UTI, AKI and low K+ and Magnesium.   PMHx:  seizures,   Clinical Impression   Pt admitted for concerns listed above. PTA pt reported that she was independent with all ADL's and IADL's, however reported that she has been becoming less independent recently. Pt was unable to tell me what she was having difficulty with at home due to active hallucinations during session. At this time, pt requiring min guard to min A for safety to complete ADL's and functional mobility, with increased cuing for safety. OT will follow acutely, recommending SNF at this time.      Recommendations for follow up therapy are one component of a multi-disciplinary discharge planning process, led by the attending physician.  Recommendations may be updated based on patient status, additional functional criteria and insurance authorization.   Follow Up Recommendations  Skilled nursing-short term rehab (<3 hours/day)    Assistance Recommended at Discharge Frequent or constant Supervision/Assistance  Patient can return home with the following A little help with walking and/or transfers;A little help with bathing/dressing/bathroom;Direct supervision/assist for medications management;Assistance with cooking/housework    Functional Status Assessment  Patient has had a recent decline in their functional status and demonstrates the ability to make significant improvements in function in a reasonable and predictable amount of time.  Equipment  Recommendations  Tub/shower seat    Recommendations for Other Services       Precautions / Restrictions Precautions Precautions: Fall Restrictions Weight Bearing Restrictions: No      Mobility Bed Mobility Overal bed mobility: Needs Assistance Bed Mobility: Sit to Supine       Sit to supine: Min guard   General bed mobility comments: Increased time ffor safety and cuing to scoot hips over to not fall into floor from EOB    Transfers Overall transfer level: Needs assistance Equipment used: Rolling walker (2 wheels) Transfers: Sit to/from Stand Sit to Stand: Min guard           General transfer comment: Min guard for safety      Balance Overall balance assessment: Needs assistance Sitting-balance support: Feet supported, No upper extremity supported Sitting balance-Leahy Scale: Good     Standing balance support: Single extremity supported Standing balance-Leahy Scale: Fair Standing balance comment: Standing at sink cleaning hherself up                           ADL either performed or assessed with clinical judgement   ADL Overall ADL's : Needs assistance/impaired Eating/Feeding: Set up;Sitting   Grooming: Min guard;Standing   Upper Body Bathing: Min guard;Sitting   Lower Body Bathing: Minimal assistance;Sitting/lateral leans;Sit to/from stand   Upper Body Dressing : Min guard;Sitting   Lower Body Dressing: Min guard;Sitting/lateral leans;Sit to/from stand   Toilet Transfer: Min guard;Ambulation   Toileting- Clothing Manipulation and Hygiene: Min guard;Sit to/from stand;Sitting/lateral lean       Functional mobility during ADLs: Min guard;Rolling walker (2 wheels)       Vision  Baseline Vision/History: 0 No visual deficits Ability to See in Adequate Light: 0 Adequate Patient Visual Report: No change from baseline Vision Assessment?: No apparent visual deficits     Perception     Praxis      Pertinent Vitals/Pain Pain  Assessment Pain Assessment: No/denies pain     Hand Dominance Right   Extremity/Trunk Assessment Upper Extremity Assessment Upper Extremity Assessment: Generalized weakness   Lower Extremity Assessment Lower Extremity Assessment: Defer to PT evaluation   Cervical / Trunk Assessment Cervical / Trunk Assessment: Normal   Communication Communication Communication: No difficulties   Cognition Arousal/Alertness: Awake/alert Behavior During Therapy: Flat affect Overall Cognitive Status: Impaired/Different from baseline                                 General Comments: Pt having hallucination throughout session     General Comments  VSS on RA    Exercises     Shoulder Instructions      Home Living Family/patient expects to be discharged to:: Private residence Living Arrangements: Alone   Type of Home: House Home Access: Stairs to enter [employer] of Steps: 2-4?   Home Layout: One level     Bathroom Shower/Tub: Teacher, early years/pre: Standard     Home Equipment: Conservation officer, nature (2 wheels);Wheelchair - manual   Additional Comments: Pt poor historian      Prior Functioning/Environment Prior Level of Function : Independent/Modified Independent             Mobility Comments: used WC for mobiltiy after self transferring to chair ADLs Comments: Pt reports declining independence        OT Problem List: Decreased strength;Decreased activity tolerance;Impaired balance (sitting and/or standing);Decreased cognition;Decreased safety awareness      OT Treatment/Interventions: Self-care/ADL training;Therapeutic exercise;Energy conservation;DME and/or AE instruction;Cognitive remediation/compensation;Therapeutic activities;Patient/family education;Balance training    OT Goals(Current goals can be found in the care plan section) Acute Rehab OT Goals Patient Stated Goal: To go to SNF OT Goal Formulation: With patient Time For  Goal Achievement: 01/21/22 Potential to Achieve Goals: Good ADL Goals Pt Will Perform Grooming: with modified independence;standing Pt Will Perform Lower Body Bathing: with modified independence;sitting/lateral leans;sit to/from stand Pt Will Perform Lower Body Dressing: with modified independence;sit to/from stand;sitting/lateral leans Pt Will Transfer to Toilet: with modified independence;ambulating Pt Will Perform Toileting - Clothing Manipulation and hygiene: with modified independence;sitting/lateral leans;sit to/from stand  OT Frequency: Min 2X/week    Co-evaluation              AM-PAC OT "6 Clicks" Daily Activity     Outcome Measure Help from another person eating meals?: A Little Help from another person taking care of personal grooming?: A Little Help from another person toileting, which includes using toliet, bedpan, or urinal?: A Little Help from another person bathing (including washing, rinsing, drying)?: A Little Help from another person to put on and taking off regular upper body clothing?: A Little Help from another person to put on and taking off regular lower body clothing?: A Little 6 Click Score: 18   End of Session Equipment Utilized During Treatment: Rolling walker (2 wheels) Nurse Communication: Mobility status  Activity Tolerance: Patient tolerated treatment well Patient left: in bed;with call bell/phone within reach;with bed alarm set  OT Visit Diagnosis: Unsteadiness on feet (R26.81);Other abnormalities of gait and mobility (R26.89);Muscle weakness (generalized) (M62.81)  Time: 0310-0326 OT Time Calculation (min): 16 min Charges:  OT General Charges $OT Visit: 1 Visit OT Evaluation $OT Eval Moderate Complexity: Tyhee, OTR/L [place] [facility]  [place] Charland Elane Yolanda Bonine 01/07/2022, 4:37 PM

## 2022-01-08 DIAGNOSIS — R55 Syncope and collapse: Secondary | ICD-10-CM | POA: Diagnosis not present

## 2022-01-08 NOTE — Progress Notes (Signed)
     Daily Progress Note Intern Pager: [phone]  Patient name: [name] Medical record number: [id number] Date of birth: 1955-09-19 Age: 66 y.o. Gender: female  Primary Care Provider: Bartholome Bill, MD Consultants: Neurology (s/o), neurosurgery (s/o) Code Status: Full  Pt Overview and Major Events to Date:  9/28: Admitted to FMTS 9/30: Medically stable for discharge to SNF  Assessment and Plan:  [name] is a 66 y.o. female who presented with head injury s/p fall, likely secondary to seizure vs syncope. Pertinent PMH/PSH includes seizures, schizoaffective disorder, and HTN.   * Bleeding in brain [facility]) Neuro exam normal and unchanged. CT head on admission showed 2 small areas of hemorrhagic contusions in the left frontal lobe. - Medically stable for discharge to SNF - Neurosurgery s/o, no intervention needed - Continue PT/OT - SCDs for DVT prophylaxis given hemorrhage   Seizures (HCC) Likely the cause of her fall. No seizure activity during this hospitalization.  - Neuro signed off - Vimpat '150mg'$  BID - Seizure precautions - No driving for 6 months  - Ativan as needed for breakthrough seizures  UTI (urinary tract infection) S/p 3 day course of antibiotics (Cefepime > Keflex, 9/28-9/30)  HTN (hypertension) Normotensive with some low diastolics intermittently. Home meds include lisinopril-HCTZ.  - hold home lisinopril-HCTZ - monitor BP  Chest pain-resolved as of 01/08/2022 Echo WNL, troponin neg, EKG stable. No further workup indicated at this time.     FEN/GI: Regular diet PPx: SCDs Dispo:SNF. Barriers include: TOC to work on SNF placement  Subjective:  No acute events overnight. Patient denies complaints this morning.  Objective: Temp:  [98.4 F (36.9 C)-98.9 F (37.2 C)] 98.9 F (37.2 C) (10/01 0405) Pulse Rate:  [64-74] 69 (10/01 0405) Resp:  [17] 17 (09/30 1540) BP: (118-138)/(50-60) 118/52 (10/01 0405) SpO2:  [95 %-98 %] 98 % (10/01  0405) Physical Exam: General: Alert, NAD Cardiovascular: RRR, normal S1/S2 Respiratory: Normal effort, lungs CTAB Abdomen: Soft, nontender, nondistended Extremities: No peripheral edema Neuro: alert, oriented x3, no obvious focal deficits Psych: pleasant, appropriate  Laboratory: Most recent CBC Lab Results  Component Value Date   WBC 6.6 01/07/2022   HGB 10.2 (L) 01/07/2022   HCT 30.0 (L) 01/07/2022   MCV 87.0 01/07/2022   PLT 294 01/07/2022   Most recent BMP    Latest Ref Rng & Units 01/07/2022    6:04 AM  BMP  Glucose 70 - 99 mg/dL 78   BUN 8 - 23 mg/dL 9   Creatinine 0.44 - 1.00 mg/dL 0.78   Sodium 135 - 145 mmol/L 137   Potassium 3.5 - 5.1 mmol/L 4.0   Chloride 98 - 111 mmol/L 107   CO2 22 - 32 mmol/L 21   Calcium 8.9 - 10.3 mg/dL 9.3     Other pertinent labs: None  Imaging/Diagnostic Tests: No new imaging over past 24 hours  Alcus Dad, MD 01/08/2022, 6:47 AM  PGY-3, [place] Intern pager: [phone], text pages welcome Secure chat group [facility] Teaching Service

## 2022-01-08 NOTE — NC FL2 (Signed)
[place] LEVEL OF CARE SCREENING TOOL     IDENTIFICATION  Patient Name: [name] Birthdate: [birth year] Sex: female Admission Date (Current Location): 01/05/2022  [facility] [place] Number:  Herbalist and Address:  [facility], [place] [address] [place], [place] [address]      Provider Number: [id number]  Attending Physician Name and Address:  Lenoria Chime, MD  Relative Name and Phone Number:       Current Level of Care: Hospital Recommended Level of Care: [place] Prior Approval Number:    Date Approved/Denied:   PASRR Number: Not in system  Discharge Plan:      Current Diagnoses: Patient Active Problem List   Diagnosis Date Noted   AMS (altered mental status) 01/06/2022   Anemia 01/06/2022   Focal hemorrhagic contusion of cerebrum ([place])    Seizures ([place]) 01/05/2022   Bleeding in brain ([place]) 01/05/2022   Elevated CK 10/17/2021   Diarrhea 10/16/2021   Tobacco abuse 10/16/2021   Dehydration 09/19/2021   QT prolongation 09/19/2021   UTI (urinary tract infection) 09/19/2021   Hyponatremia 09/18/2021   Schizoaffective disorder, bipolar type ([place])    Closed intertrochanteric fracture of left femur, initial encounter ([place]) 01/24/2020   HTN (hypertension) 01/12/2020   Hypophosphatemia 01/12/2020   Acute pancreatitis 01/08/2020   Depression 01/08/2020   Diverticulosis 01/08/2020   Tubular adenoma of colon 01/08/2020   Sepsis, unspecified organism ([place]) 01/08/2020   Prolapsed internal hemorrhoids, grade 3 12/01/2015   Schizoaffective disorder, depressive type ([place]) 11/04/2013   PTSD (post-traumatic stress disorder) 11/04/2013   Cigarette nicotine dependence without complication 60/73/7106    Orientation RESPIRATION BLADDER Height & Weight     Self, Time, Situation, Place  Normal Continent Weight: 115 lb 15.4 oz (52.6 kg) Height:  '5\' 2"'$  (157.5 cm)  BEHAVIORAL SYMPTOMS/MOOD NEUROLOGICAL  BOWEL NUTRITION STATUS      Continent Diet (See DC summary)  AMBULATORY STATUS COMMUNICATION OF NEEDS Skin   Extensive Assist Verbally Skin abrasions (Face Laceration)                       Personal Care Assistance Level of Assistance  Bathing, Feeding, Dressing Bathing Assistance: Maximum assistance Feeding assistance: Limited assistance Dressing Assistance: Maximum assistance     Functional Limitations Info  Sight, Hearing, Speech Sight Info: Adequate Hearing Info: Adequate Speech Info: Adequate    SPECIAL CARE FACTORS FREQUENCY  PT (By licensed PT), OT (By licensed OT)     PT Frequency: 5x week OT Frequency: 5x week            Contractures Contractures Info: Not present    Additional Factors Info  Code Status, Allergies, Psychotropic Code Status Info: Full Allergies Info: Penicillins, sulfa antibiotics Psychotropic Info: Ariprazole, sertraline         Current Medications (01/08/2022):  This is the current hospital active medication list Current Facility-Administered Medications  Medication Dose Route Frequency Provider Last Rate Last Admin   acetaminophen (TYLENOL) tablet 650 mg  650 mg Oral Q6H PRN Rolanda Lundborg, MD       ARIPiprazole (ABILIFY) tablet 4 mg  4 mg Oral Daily Rolanda Lundborg, MD   4 mg at 01/08/22 1027   lacosamide (VIMPAT) tablet 150 mg  150 mg Oral BID Erskine Emery, MD   150 mg at 01/08/22 1027   LORazepam (ATIVAN) injection 4 mg  4 mg Intramuscular PRN Erskine Emery, MD  Oral care mouth rinse  15 mL Mouth Rinse PRN Lenoria Chime, MD       sertraline (ZOLOFT) tablet 100 mg  100 mg Oral BID Rolanda Lundborg, MD   100 mg at 01/08/22 1027     Discharge Medications: Please see discharge summary for a list of discharge medications.  Relevant Imaging Results:  Relevant Lab Results:   Additional Information SS# [place], [place]

## 2022-01-08 NOTE — TOC Initial Note (Signed)
Transition of Care [facility]) - Initial/Assessment Note    Patient Details  Name: [name] MRN: [id number] Date of Birth: 01/10/1956  Transition of Care [facility]) CM/SW Contact:    Coralee Pesa, [place] Phone Number: 01/08/2022, 10:45 AM  Clinical Narrative:                 CSW spoke with pt and advised of SNF recommendation. Pt is agreeable and states [place] near [place]. Pt states there is a facility on [address] in [place] she is interested in. CSW aware of medical facility and inpatient rehab there, but not a SNF, pt agreeable to a fax out in the area for options. Pt provided with Medicare. Gov website. Pt is from home alone, and plans on returning after SNF. TOC will continue to follow for DC needs.  Expected Discharge Plan: Skilled Nursing Facility Barriers to Discharge: Continued Medical Work up, Ship broker, SNF Pending bed offer   Patient Goals and CMS Choice Patient states their goals for this hospitalization and ongoing recovery are:: Pt would like to be more independent. CMS Medicare.gov Compare Post Acute Care list provided to:: Patient Choice offered to / list presented to : Patient  Expected Discharge Plan and Services Expected Discharge Plan: [place] Acute Care Choice: [place] arrangements for the past 2 months: Single Family Home                                      Prior Living Arrangements/Services Living arrangements for the past 2 months: Single Family Home Lives with:: Self Patient language and need for interpreter reviewed:: Yes Do you feel safe going back to the place where you live?: Yes      Need for Family Participation in Patient Care: Yes (Comment) Care giver support system in place?: No (comment)   Criminal Activity/Legal Involvement Pertinent to Current Situation/Hospitalization: No - Comment as needed  Activities of Daily Living Home  Assistive Devices/Equipment: None ADL Screening (condition at time of admission) Patient's cognitive ability adequate to safely complete daily activities?: Yes Is the patient deaf or have difficulty hearing?: No Does the patient have difficulty seeing, even when wearing glasses/contacts?: No Does the patient have difficulty concentrating, remembering, or making decisions?: No Patient able to express need for assistance with ADLs?: Yes Does the patient have difficulty dressing or bathing?: No Independently performs ADLs?: No Communication: Independent Dressing (OT): Needs assistance Is this a change from baseline?: Change from baseline, expected to last <3days Grooming: Independent Feeding: Independent Bathing: Needs assistance Is this a change from baseline?: Pre-admission baseline Toileting: Independent with device (comment) In/Out Bed: Independent with device (comment) Walks in Home: Independent Does the patient have difficulty walking or climbing stairs?: Yes Weakness of Legs: Both Weakness of Arms/Hands: None  Permission Sought/Granted Permission sought to share information with : Family Supports Permission granted to share information with : No              Emotional Assessment Appearance:: Appears stated age Attitude/Demeanor/Rapport: Engaged Affect (typically observed): Appropriate Orientation: : Oriented to Self, Oriented to Place, Oriented to  Time, Oriented to Situation Alcohol / Substance Use: Not Applicable Psych Involvement: No (comment)  Admission diagnosis:  Altered mental status [R41.82] Altered mental status, unspecified altered mental status type [R41.82] Focal hemorrhagic contusion of cerebrum ([place]) [  S06.33AA] AMS (altered mental status) [R41.82] Patient Active Problem List   Diagnosis Date Noted   AMS (altered mental status) 01/06/2022   Anemia 01/06/2022   Focal hemorrhagic contusion of cerebrum (HCC)    Seizures ([place]) 01/05/2022   Bleeding in  brain ([place]) 01/05/2022   Elevated CK 10/17/2021   Diarrhea 10/16/2021   Tobacco abuse 10/16/2021   Dehydration 09/19/2021   QT prolongation 09/19/2021   UTI (urinary tract infection) 09/19/2021   Hyponatremia 09/18/2021   Schizoaffective disorder, bipolar type ([place])    Closed intertrochanteric fracture of left femur, initial encounter ([place]) 01/24/2020   HTN (hypertension) 01/12/2020   Hypophosphatemia 01/12/2020   Acute pancreatitis 01/08/2020   Depression 01/08/2020   Diverticulosis 01/08/2020   Tubular adenoma of colon 01/08/2020   Sepsis, unspecified organism ([place]) 01/08/2020   Prolapsed internal hemorrhoids, grade 3 12/01/2015   Schizoaffective disorder, depressive type ([place]) 11/04/2013   PTSD (post-traumatic stress disorder) 11/04/2013   Cigarette nicotine dependence without complication 70/14/1030   PCP:  Bartholome Bill, MD Pharmacy:   [facility] [place], [address] AT [address] [place] [address] Phone: [phone] Fax: [place] [address] Phone: [phone] Fax: [phone]     Social Determinants of Health (SDOH) Interventions    Readmission Risk Interventions    10/18/2021   11:52 AM  Readmission Risk Prevention Plan  Transportation Screening Complete  PCP or Specialist Appt within 5-7 Days Complete  Home Care Screening Complete  Medication Review (RN CM) Complete

## 2022-01-09 DIAGNOSIS — R55 Syncope and collapse: Secondary | ICD-10-CM | POA: Diagnosis not present

## 2022-01-09 LAB — URINALYSIS, COMPLETE (UACMP) WITH MICROSCOPIC
Bilirubin Urine: NEGATIVE
Glucose, UA: NEGATIVE mg/dL
Hgb urine dipstick: NEGATIVE
Ketones, ur: NEGATIVE mg/dL
Nitrite: NEGATIVE
Protein, ur: NEGATIVE mg/dL
Specific Gravity, Urine: 1.008 (ref 1.005–1.030)
pH: 7 (ref 5.0–8.0)

## 2022-01-09 MED ORDER — RISAQUAD PO CAPS: 2.0000 | ORAL_CAPSULE | Freq: Every day | ORAL | 0 refills | Status: DC

## 2022-01-09 MED ORDER — LACOSAMIDE 150 MG PO TABS: 150.0000 mg | ORAL_TABLET | Freq: Two times a day (BID) | ORAL | Status: DC

## 2022-01-09 MED ORDER — TRAZODONE HCL 100 MG PO TABS
200.0000 mg | ORAL_TABLET | Freq: Every evening | ORAL | Status: DC | PRN
  Administered 2022-01-09 – 2022-01-11 (×3): 200 mg via ORAL
  Filled 2022-01-09 (×3): qty 2
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
## 2022-01-09 NOTE — Progress Notes (Signed)
Name: [name] DOB: [birth year]  Please be advised that the above-named patient will require a short-term nursing home stay -- anticipated 30 days or less for rehabilitation and strengthening. The plan is for return home.

## 2022-01-09 NOTE — Progress Notes (Signed)
Day Shift Nurse for 01/10/2022   Pt's dog in currently in a shelter being held since she was admitted to the hospital. Officer Yong Channel is the point of contact his number is [phone], He is available from 830am -430 pm. The pt needs to confirm with Officer Yong Channel that her dog can be released to her friends Herbie Baltimore and Lattie Haw NOT ERICA, her daughter. This will allow her dog to be released from the shelter.

## 2022-01-09 NOTE — Progress Notes (Signed)
     Daily Progress Note Intern Pager: [phone]  Patient name: [name] Medical record number: [id number] Date of birth: Jun 01, 1955 Age: 66 y.o. Gender: female  Primary Care Provider: Bartholome Bill, MD Consultants: Neurology (S/O), neurosurgery (S/O) Code Status: Full  Pt Overview and Major Events to Date:  9/28: Admitted to FMTS 9/30:   Assessment and Plan:   *Medically for discharge to SNF*  Judyth Demarais is a 66 year old female who presented with head injury status post fall likely secondary to seizure versus syncope.  Pertinent past medical history includes seizures, schizoaffective sorter, hypertension  * Bleeding in brain [facility]) Neuro exam normal and unchanged. CT head on admission showed 2 small areas of hemorrhagic contusions in the left frontal lobe. - Medically stable for discharge to SNF - Neurosurgery s/o, no intervention needed - Continue PT/OT - SCDs for DVT prophylaxis given hemorrhage   Seizures ([place]) Seizure activity while hospitalized. - Vimpat '150mg'$  BID - Seizure precautions - No driving for 6 months  - Ativan as needed for breakthrough seizures  UTI (urinary tract infection) Completed 3-day course of antibiotics.  Asymptomatic today.  HTN (hypertension) Normotensive without treatment. - hold home lisinopril-HCTZ - monitor BP  Chest pain-resolved as of 01/08/2022 Echo WNL, troponin neg, EKG stable. No further workup indicated at this time.     FEN/GI: Regular diet PPx: SCD Dispo:SNF  today or tomorrow pending placement .   Subjective:  Patient reports that she is feeling well today.  She has no questions for [place].  We discussed that she will likely go to SNF.  Patient is okay with this plan  Objective: Temp:  [97.6 F (36.4 C)-98 F (36.7 C)] 97.6 F (36.4 C) (10/02 0911) Pulse Rate:  [64-85] 85 (10/02 0911) Resp:  [19] 19 (10/02 0911) BP: (105-150)/(67-69) 105/67 (10/02 0911) SpO2:  [96 %-98 %] 98 % (10/02 0911) Physical  Exam: General: Not in acute distress Cardiovascular: RRR Respiratory: CTAB, normal work of breathing on room air Extremities: 5 out of 5 upper and lower extremity strength bilaterally Neuro: EOM intact bilaterally.  Alert and oriented x4.   Laboratory: Most recent CBC Lab Results  Component Value Date   WBC 6.6 01/07/2022   HGB 10.2 (L) 01/07/2022   HCT 30.0 (L) 01/07/2022   MCV 87.0 01/07/2022   PLT 294 01/07/2022   Most recent BMP    Latest Ref Rng & Units 01/07/2022    6:04 AM  BMP  Glucose 70 - 99 mg/dL 78   BUN 8 - 23 mg/dL 9   Creatinine 0.44 - 1.00 mg/dL 0.78   Sodium 135 - 145 mmol/L 137   Potassium 3.5 - 5.1 mmol/L 4.0   Chloride 98 - 111 mmol/L 107   CO2 22 - 32 mmol/L 21   Calcium 8.9 - 10.3 mg/dL 9.3      Leslie Dales, DO 01/09/2022, 9:29 AM  PGY-1, [place] Intern pager: [phone], text pages welcome Secure chat group [place]

## 2022-01-09 NOTE — Care Management Important Message (Signed)
Important Message  Patient Details  Name: [name] MRN: [id number] Date of Birth: 1956-03-22   Medicare Important Message Given:  Yes     Orbie Pyo 01/09/2022, 3:52 PM

## 2022-01-09 NOTE — TOC Progression Note (Signed)
Transition of Care [facility]) - Progression Note    Patient Details  Name: [name] MRN: [id number] Date of Birth: June 01, 1955  Transition of Care [facility]) CM/SW [place], [place] Phone Number: 01/09/2022, 3:47 PM  Clinical Narrative:   CSW contacted [facility] to look up patient information, submitted new [facility] request, uploaded documentation. CSW to follow.    Expected Discharge Plan: Skilled Nursing Facility Barriers to Discharge: Continued Medical Work up, Ship broker, Environmental education officer)  Expected Discharge Plan and Services Expected Discharge Plan: [place] Choice: [place] [facility] Living arrangements for the past 2 months: Single Family Home                                       Social Determinants of Health (SDOH) Interventions    Readmission Risk Interventions    10/18/2021   11:52 AM  Readmission Risk Prevention Plan  Transportation Screening Complete  PCP or Specialist Appt within 5-7 Days Complete  Home Care Screening Complete  Medication Review (RN CM) Complete
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
## 2022-01-10 DIAGNOSIS — S0636AA Traumatic hemorrhage of cerebrum, unspecified, with loss of consciousness status unknown, initial encounter: Secondary | ICD-10-CM

## 2022-01-10 MED ORDER — ARIPIPRAZOLE 10 MG PO TABS
5.0000 mg | ORAL_TABLET | Freq: Every day | ORAL | Status: DC
  Administered 2022-01-11 – 2022-01-13 (×3): 5 mg via ORAL
  Filled 2022-01-10 (×3): qty 1

## 2022-01-10 NOTE — Assessment & Plan Note (Addendum)
Stable.  Safe for discharge to SNF. - Continue Abilify, Zoloft, trazodone
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
## 2022-01-11 NOTE — Progress Notes (Signed)
Mobility Specialist: Progress Note   01/11/22 1725  Mobility  Activity Ambulated with assistance in hallway  Activity Response Tolerated well  Distance Ambulated (ft) 480 ft  $Mobility charge 1 Mobility  Level of Assistance Contact guard assist, steadying assist  Assistive Device Front wheel walker   Received pt in bed having no complaints and agreeable to mobility. Pt was asymptomatic throughout ambulation and returned to room w/o fault. Left in bed w/ call bell in reach and all needs met.  [facility] [place] Mobility Specialist Mobility Specialist [address]: [phone]

## 2022-01-11 NOTE — Progress Notes (Signed)
Patient presents for suture removal. The wound is well healed without signs of infection. 7 sutures were removed using sterile scissors and forceps. Steri strips placed on forehead laceration.

## 2022-01-11 NOTE — Progress Notes (Signed)
     Daily Progress Note Intern Pager: [phone]  Patient name: [name] Medical record number: [id number] Date of birth: [birth year] Age: 66 y.o. Gender: female  Primary Care Provider: Bartholome Bill, MD Consultants: Psychiatry Code Status: Full  Pt Overview and Major Events to Date:  9/28: Admitted to FMTS 9/30: Medically stable for discharge to SNF 10/3: Psychiatry referral placed, discharge planning 10/4: Patient is medically cleared by both psychiatry and found medicine for discharge to SNF  Assessment and Plan:  Asheley Keith is a 66 year old female who presented with a head injury status post fall extremity seizure versus syncope.  Pertinent past medical include seizures, schizoaffective sorter, hypertension.  * Bleeding in brain [facility]) Patient is neurally intact, pleasantly conversing and eating her breakfast this morning. - Medically stable for discharge to SNF - Continue PT/OT  Seizures ([place]) Patient remains seizure-free today. - Vimpat '150mg'$  BID - Seizure precautions - No driving for 6 months  - Ativan as needed for breakthrough seizures  Schizoaffective disorder, depressive type [facility]) Psychiatry evaluated this patient.  They increased Abilify to 5 mg.  Per psychiatry she is stable to discharge to SNF. - Continue Abilify, Zoloft, trazodone  HTN (hypertension) Normotensive without treatment. - hold home lisinopril-HCTZ - monitor BP  Chest pain-resolved as of 01/08/2022 Echo WNL, troponin neg, EKG stable. No further workup indicated at this time.     FEN/GI: Regular PPx: Ambulatory, SCDs Dispo:SNF  pending SNF placement .  Subjective:  Patient has no acute concerns today, she is pleasant conversing and was eating breakfast.  Objective: Temp:  [98.1 F (36.7 C)-99.1 F (37.3 C)] 98.8 F (37.1 C) (10/04 1059) Pulse Rate:  [57-69] 65 (10/04 1059) Resp:  [14-20] 20 (10/04 1059) BP: (121-159)/(53-76) 133/53 (10/04 1059) SpO2:  [93 %-98 %] 98  % (10/04 1059) Physical Exam: General: Not in acute distress, pleasant Cardiovascular: Regular rate and rhythm, no MRG Respiratory: CTAB, normal work of breathing  Laboratory: Most recent CBC Lab Results  Component Value Date   WBC 6.6 01/07/2022   HGB 10.2 (L) 01/07/2022   HCT 30.0 (L) 01/07/2022   MCV 87.0 01/07/2022   PLT 294 01/07/2022   Most recent BMP    Latest Ref Rng & Units 01/07/2022    6:04 AM  BMP  Glucose 70 - 99 mg/dL 78   BUN 8 - 23 mg/dL 9   Creatinine 0.44 - 1.00 mg/dL 0.78   Sodium 135 - 145 mmol/L 137   Potassium 3.5 - 5.1 mmol/L 4.0   Chloride 98 - 111 mmol/L 107   CO2 22 - 32 mmol/L 21   Calcium 8.9 - 10.3 mg/dL 9.3     Joyce Dales, DO 01/11/2022, 12:01 PM  PGY-1, [place] Intern pager: [phone], text pages welcome Secure chat group [place]

## 2022-01-11 NOTE — TOC Progression Note (Addendum)
Transition of Care [facility]) - Progression Note    Patient Details  Name: [name] MRN: [id number] Date of Birth: 09-02-1955  Transition of Care [facility]) CM/SW [place], [place] Phone Number: 01/11/2022, 2:52 PM  Clinical Narrative:   CSW met with patient to provide bed offers and reviewed options. Patient chose [facility]. CSW asked about updating daughter, and patient in agreement. CSW confirmed bed availability with [facility] and initiated insurance authorization request. CSW to follow.  UPDATE 5:12 PM: CSW spoke with daughter, [name], to provide update. She is in agreement, will assist with paperwork to go to SNF. CSW answered questions, will continue to follow.    Expected Discharge Plan: Skilled Nursing Facility Barriers to Discharge: Continued Medical Work up, Ship broker, Environmental education officer)  Expected Discharge Plan and Services Expected Discharge Plan: [place] Choice: [place] Living arrangements for the past 2 months: Single Family Home                                       Social Determinants of Health (SDOH) Interventions    Readmission Risk Interventions    10/18/2021   11:52 AM  Readmission Risk Prevention Plan  Transportation Screening Complete  PCP or Specialist Appt within 5-7 Days Complete  Home Care Screening Complete  Medication Review (RN CM) Complete

## 2022-01-11 NOTE — TOC CAGE-AID Note (Signed)
Transition of Care [facility]) - CAGE-AID Screening   Patient Details  Name: [name] MRN: [id number] Date of Birth: [birth year]  Transition of Care [facility]) CM/SW Contact:    Army Melia, RN Phone Number:[phone] 01/11/2022, 12:47 AM   Clinical Narrative:  No drug or alcohol use hx, no resources indicated.  CAGE-AID Screening:    Have You Ever Felt You Ought to Cut Down on Your Drinking or Drug Use?: No Have People Annoyed You By Critizing Your Drinking Or Drug Use?: No Have You Felt Bad Or Guilty About Your Drinking Or Drug Use?: No Have You Ever Had a Drink or Used Drugs First Thing In The Morning to Steady Your Nerves or to Get Rid of a Hangover?: No CAGE-AID Score: 0  Substance Abuse Education Offered: No

## 2022-01-12 ENCOUNTER — Ambulatory Visit (HOSPITAL_COMMUNITY): Payer: Medicare Other | Admitting: Psychiatry

## 2022-01-12 DIAGNOSIS — F251 Schizoaffective disorder, depressive type: Secondary | ICD-10-CM

## 2022-01-12 DIAGNOSIS — S0633AA Contusion and laceration of cerebrum, unspecified, with loss of consciousness status unknown, initial encounter: Secondary | ICD-10-CM

## 2022-01-12 DIAGNOSIS — R410 Disorientation, unspecified: Secondary | ICD-10-CM

## 2022-01-12 LAB — CBC
HCT: 32.3 % — ABNORMAL LOW (ref 36.0–46.0)
Hemoglobin: 11 g/dL — ABNORMAL LOW (ref 12.0–15.0)
MCH: 29.6 pg (ref 26.0–34.0)
MCHC: 34.1 g/dL (ref 30.0–36.0)
MCV: 86.8 fL (ref 80.0–100.0)
Platelets: 210 10*3/uL (ref 150–400)
RBC: 3.72 MIL/uL — ABNORMAL LOW (ref 3.87–5.11)
RDW: 15.1 % (ref 11.5–15.5)
WBC: 5.6 10*3/uL (ref 4.0–10.5)
nRBC: 0 % (ref 0.0–0.2)

## 2022-01-12 LAB — DIFFERENTIAL
Abs Immature Granulocytes: 0.02 10*3/uL (ref 0.00–0.07)
Basophils Absolute: 0.1 10*3/uL (ref 0.0–0.1)
Basophils Relative: 1 %
Eosinophils Absolute: 0.3 10*3/uL (ref 0.0–0.5)
Eosinophils Relative: 5 %
Immature Granulocytes: 0 %
Lymphocytes Relative: 30 %
Lymphs Abs: 1.7 10*3/uL (ref 0.7–4.0)
Monocytes Absolute: 0.4 10*3/uL (ref 0.1–1.0)
Monocytes Relative: 7 %
Neutro Abs: 3.2 10*3/uL (ref 1.7–7.7)
Neutrophils Relative %: 57 %

## 2022-01-12 LAB — COMPREHENSIVE METABOLIC PANEL
ALT: 18 U/L (ref 0–44)
AST: 28 U/L (ref 15–41)
Albumin: 3.3 g/dL — ABNORMAL LOW (ref 3.5–5.0)
Alkaline Phosphatase: 69 U/L (ref 38–126)
Anion gap: 8 (ref 5–15)
BUN: 7 mg/dL — ABNORMAL LOW (ref 8–23)
CO2: 23 mmol/L (ref 22–32)
Calcium: 9.8 mg/dL (ref 8.9–10.3)
Chloride: 104 mmol/L (ref 98–111)
Creatinine, Ser: 0.62 mg/dL (ref 0.44–1.00)
GFR, Estimated: >60 mL/min (ref >60)
Glucose, Bld: 183 mg/dL — ABNORMAL HIGH (ref 70–99)
Potassium: 3.8 mmol/L (ref 3.5–5.1)
Sodium: 135 mmol/L (ref 135–145)
Total Bilirubin: 0.3 mg/dL (ref 0.3–1.2)
Total Protein: 6.7 g/dL (ref 6.5–8.1)

## 2022-01-12 MED ORDER — ARIPIPRAZOLE 5 MG PO TABS: 5.0000 mg | ORAL_TABLET | Freq: Every day | ORAL | Status: DC

## 2022-01-12 NOTE — Progress Notes (Signed)
     Daily Progress Note Intern Pager: [phone]  Patient name: [name] Medical record number: [id number] Date of birth: [birth year] Age: 66 y.o. Gender: female  Primary Care Provider: Bartholome Bill, MD Consultants: Psychiatry Code Status: Full  Pt Overview and Major Events to Date:  9/28: Admitted to FMTS 9/30: Medically stable for discharge to SNF 10/3: Psychiatry referral placed, discharge planning 10/4: Patient is medically cleared by both psychiatry and family medicine for discharge to SNF  Assessment and Plan:  [name] is a 66 year old female who presented with head injury status post fall, secondary to seizure versus syncope.  She did not require surgical intervention, no seizures since admission.  Past medical history includes seizures, schizoaffective disorder, hypertension  * Bleeding in brain ([place]) No new neurologic deficits, remains baseline.  Stable. - Medically stable for discharge to SNF - Continue PT/OT  Seizures ([place]) No seizures since admission. - Vimpat '150mg'$  BID - Seizure precautions - No driving for 6 months  - Ativan as needed for breakthrough seizures  Schizoaffective disorder, depressive type ([place]) Safe to discharge to SNF. - Continue Abilify, Zoloft, trazodone  HTN (hypertension) Normotensive without treatment. - hold home lisinopril-HCTZ - monitor BP  Chest pain-resolved as of 01/08/2022 Echo WNL, troponin neg, EKG stable. No further workup indicated at this time.     FEN/GI: Regular PPx: SCDs, ambulatory Dispo:SNF  pending insurance approval .   Subjective:  Patient has no questions or concerns today.  She would like to know when she can leave, we discussed that we are waiting for insurance approval for SNF.  She is agreeable to this.  Objective: Temp:  [97.7 F (36.5 C)-98.8 F (37.1 C)] 98.2 F (36.8 C) (10/05 0818) Pulse Rate:  [53-65] 62 (10/05 0818) Resp:  [16-23] 18 (10/05 0818) BP: (133-145)/(43-74)  143/65 (10/05 0818) SpO2:  [94 %-99 %] 99 % (10/05 0818) Physical Exam: General: Not in acute distress, sitting and eating Cardiovascular: Regular rate and rhythm, no MRG Respiratory: CTAB, normal work of breathing on room air  Laboratory: Most recent CBC Lab Results  Component Value Date   WBC 5.6 01/12/2022   HGB 11.0 (L) 01/12/2022   HCT 32.3 (L) 01/12/2022   MCV 86.8 01/12/2022   PLT 210 01/12/2022   Most recent BMP    Latest Ref Rng & Units 01/07/2022    6:04 AM  BMP  Glucose 70 - 99 mg/dL 78   BUN 8 - 23 mg/dL 9   Creatinine 0.44 - 1.00 mg/dL 0.78   Sodium 135 - 145 mmol/L 137   Potassium 3.5 - 5.1 mmol/L 4.0   Chloride 98 - 111 mmol/L 107   CO2 22 - 32 mmol/L 21   Calcium 8.9 - 10.3 mg/dL 9.3     Leslie Dales, DO 01/12/2022, 9:07 AM  PGY-1, [place] Intern pager: [phone], text pages welcome Secure chat group [facility] Teaching Service \

## 2022-01-12 NOTE — Consult Note (Signed)
[facility] Face-to-Face Psychiatry Consult   Reason for Consult:  Schizoaffective disorder Referring Physician:  Leslie Dales Patient Identification: [name] MRN:  [id number] Principal Diagnosis: Bleeding in brain [facility]) Diagnosis:  Principal Problem:   Bleeding in brain [facility]) Active Problems:   Schizoaffective disorder, depressive type ([place])   HTN (hypertension)   Seizures ([place])   AMS (altered mental status)   Anemia   Total Time spent with patient: 1 hour  " Im doing better."    Subjective:   [name] is a 66 y.o. female patient admitted with falls, memory loss, and confusion.  66 year old female with hisotry of schizoaffective disorder, depression type who presented to the ED with complaints of confusion and AMS. She reports a recent history of hallucinations and fleeting suicidal thoughts, with no plan.  Her vital signs are within normal limits she is well-appearing in no acute distress. She is observed to be sitting up right on the edge of the bed, with no assistance, grooming her hair and recently completed her breakfast.  She has been compliant with her psychotropic medications during this hospitalization. She denies any recent changes, side effects, or adverse reactions as it relates to the dose increase of her ABilify. She was increased to Abilify '5mg'$  po daily x 2 days ago.  There was an initial concern for possible decompensation of schizoaffective disorder as patient has been having some hallucinations and suicidal ideations in the setting of medication adjustments and multiple hospital admissions. She does not appear to be acutely psychotic, manic, or depressed as she denies symptoms consistent with all at this time. She does not appear to be displaying signs of psychosis, depression or mania during this evaluation. SHe is not observed to be responding to internal stimuli, external stimuli, or displaying delusional thoughts.    On today's reassessment patient did not  present with any focal neurological deficit, she was able to speak clearly, follow commands, and denies weakness.   At this current time, patient's pre-existing condition that was substantially aggravated prior to this admission has returned to previous level.  Patient further states that her current health at this present time seems to be normal as she is noted to feel better and like her self again.  SHe does continue to endorse some mental fog, overall assessing is doing better.  SHe does not present with any decrease in attention or concentration, he is alert and oriented x 4, shows no short-term memory deficit.  She is not lethargic on today's exam, shows linear thought processes, and answers all questions appropriately.  At this time it is felt that patient has returned to psychiatric baseline. He denies suicidal ideation, homicidal ideation, and or auditory or visual hallucinations   HPI:  Brought in by EMS after she was found wandering around her neighborhood.  She was several houses down from her home.  She also reportedly had a fall last night.  She said she fell off the bed and hit the nightstand.  She has a laceration to her left forehead.   History of schizoaffective disorder, PTSD, hypertension, recent C. difficile infection in August.  She was admitted to [facility] on September 13 with encephalopathy.  She was found wandering at that point.  She also had a seizure while in the hospital on September 15.  She was started on Vimpat.  However EEG monitoring did not reveal any ongoing seizure activity.  She was intubated and in ICU for a while following this event.  She  recovered and ultimately was discharged back to home  Past Psychiatric History: Pt endorse multiple hospital admissions for schizoaffective disorder in the past. She endorses history of suicidal thoughts and suicidal ideations. Pt denies history of aggression, agitation, violent behavior, and or history of homicidal  ideations/thoughts.  Patient further denies any current, previous legal charges.  Patient further denies access to guns, weapons, or any engagement with the legal system.  Patient denies history of illicit substances to include synthetic substances, any cannabidiol, supplemental herbs; States that she has has 13 suicide attempts in the past where she "took pills" and has been admitted to psych facilities 13 times in the past, with the last one being Nov 2007  Risk to Self:  denies Risk to Others:  denies Prior Inpatient Therapy:  yes Prior Outpatient Therapy:  yes  Past Medical History:  Past Medical History:  Diagnosis Date   DDD (degenerative disc disease), lumbar    Depression    Diverticulosis    Dizziness    GERD (gastroesophageal reflux disease)    IBS (irritable bowel syndrome)    Internal hemorrhoid    2nd degree   Myocarditis ([place]) 2009   Osteoarthritis    Prolapsed internal hemorrhoids, grade 3 12/01/2015   Recurrent falls    Schizoaffective disorder ([place])    Tubular adenoma of colon 08/2011    Past Surgical History:  Procedure Laterality Date   BIOPSY  10/19/2021   Procedure: BIOPSY;  Surgeon: Clarene Essex, MD;  Location: [facility];  Service: Gastroenterology;;   BREAST BIOPSY Right 12/27/2012   CHOLECYSTECTOMY     ESOPHAGOGASTRODUODENOSCOPY (EGD) WITH PROPOFOL N/A 10/19/2021   Procedure: ESOPHAGOGASTRODUODENOSCOPY (EGD) WITH PROPOFOL;  Surgeon: Clarene Essex, MD;  Location: [facility];  Service: Gastroenterology;  Laterality: N/A;   FOREIGN BODY REMOVAL  10/19/2021   Procedure: FOREIGN BODY REMOVAL;  Surgeon: Clarene Essex, MD;  Location: [facility];  Service: Gastroenterology;;   HEMORRHOID BANDING     INTRAMEDULLARY (IM) NAIL INTERTROCHANTERIC Left 01/24/2020   Procedure: INTRAMEDULLARY (IM) NAIL INTERTROCHANTRIC;  Surgeon: Leandrew Koyanagi, MD;  Location: [facility];  Service: Orthopedics;  Laterality: Left;   Family History:  Family History  Problem Relation Age of  Onset   Suicidality Father    Depression Father    Suicidality Sister    Depression Sister    Suicidality Cousin    Suicidality Other    Family Psychiatric  History: unk Social History:  Social History   Substance and Sexual Activity  Alcohol Use No   Alcohol/week: 0.0 standard drinks of alcohol     Social History   Substance and Sexual Activity  Drug Use No    Social History   Socioeconomic History   Marital status: Divorced    Spouse name: Not on file   Number of children: 2   Years of education: Not on file   Highest education level: Not on file  Occupational History   Occupation: Disabled  Tobacco Use   Smoking status: Every Day    Packs/day: 0.50    Years: 38.00    Total pack years: 19.00    Types: Cigarettes   Smokeless tobacco: Never  Vaping Use   Vaping Use: Never used  Substance and Sexual Activity   Alcohol use: No    Alcohol/week: 0.0 standard drinks of alcohol   Drug use: No   Sexual activity: Not Currently  Other Topics Concern   Not on file  Social History Narrative   Not on file  Social Determinants of Health   Financial Resource Strain: Not on file  Food Insecurity: Not on file  Transportation Needs: Not on file  Physical Activity: Not on file  Stress: Not on file  Social Connections: Not on file   Additional Social History:    Allergies:   Allergies  Allergen Reactions   Penicillins Rash          Sulfa Antibiotics Rash    Labs:  Results for orders placed or performed during the hospital encounter of 01/05/22 (from the past 48 hour(s))  CBC     Status: Abnormal   Collection Time: 01/12/22  5:56 AM  Result Value Ref Range   WBC 5.6 4.0 - 10.5 K/uL   RBC 3.72 (L) 3.87 - 5.11 MIL/uL   Hemoglobin 11.0 (L) 12.0 - 15.0 g/dL   HCT 32.3 (L) 36.0 - 46.0 %   MCV 86.8 80.0 - 100.0 fL   MCH 29.6 26.0 - 34.0 pg   MCHC 34.1 30.0 - 36.0 g/dL   RDW 15.1 11.5 - 15.5 %   Platelets 210 150 - 400 K/uL   nRBC 0.0 0.0 - 0.2 %     Comment: Performed at [facility], [place] [address] [place], [place] [address]  Differential     Status: None   Collection Time: 01/12/22  5:56 AM  Result Value Ref Range   Neutrophils Relative % 57 %   Neutro Abs 3.2 1.7 - 7.7 K/uL   Lymphocytes Relative 30 %   Lymphs Abs 1.7 0.7 - 4.0 K/uL   Monocytes Relative 7 %   Monocytes Absolute 0.4 0.1 - 1.0 K/uL   Eosinophils Relative 5 %   Eosinophils Absolute 0.3 0.0 - 0.5 K/uL   Basophils Relative 1 %   Basophils Absolute 0.1 0.0 - 0.1 K/uL   Immature Granulocytes 0 %   Abs Immature Granulocytes 0.02 0.00 - 0.07 K/uL    Comment: Performed at [facility], [place] [address], [place], [place] [address]    Current Facility-Administered Medications  Medication Dose Route Frequency Provider Last Rate Last Admin   acetaminophen (TYLENOL) tablet 650 mg  650 mg Oral Q6H PRN Rolanda Lundborg, MD       ARIPiprazole (ABILIFY) tablet 5 mg  5 mg Oral Daily Suella Broad, FNP   5 mg at 01/12/22 0830   lacosamide (VIMPAT) tablet 150 mg  150 mg Oral BID Erskine Emery, MD   150 mg at 01/12/22 0829   LORazepam (ATIVAN) injection 4 mg  4 mg Intramuscular PRN Erskine Emery, MD       Oral care mouth rinse  15 mL Mouth Rinse PRN Lenoria Chime, MD       sertraline (ZOLOFT) tablet 100 mg  100 mg Oral BID Rolanda Lundborg, MD   100 mg at 01/12/22 0830   traZODone (DESYREL) tablet 200 mg  200 mg Oral QHS PRN Sharion Settler, DO   200 mg at 01/11/22 2142    Musculoskeletal: Strength & Muscle Tone: within normal limits Gait & Station: normal Patient leans: N/A  Psychiatric Specialty Exam:  Presentation  General Appearance:  Appropriate for Environment; Casual; Fairly Groomed  Eye Contact: Good  Speech: Clear and Coherent; Normal Rate  Speech Volume: Normal  Handedness: Right   Mood and Affect  Mood: Euthymic  Affect: Appropriate; Congruent   Thought Process  Thought Processes: Coherent; Goal  Directed  Descriptions of Associations:Intact  Orientation:Full (Time, Place and Person)  Thought Content:Logical  History of Schizophrenia/Schizoaffective disorder:No data recorded Duration of Psychotic Symptoms:No data recorded Hallucinations:Hallucinations: None Ideas of Reference:None  Suicidal Thoughts:Suicidal Thoughts: No Homicidal Thoughts:Homicidal Thoughts: No  Sensorium  Memory: Immediate Good; Recent Fair; Remote Good  Judgment: Fair  Insight: Good   Executive Functions  Concentration: Good  Attention Span: Fair  Recall: Good  Fund of Knowledge: Good  Language: Good   Psychomotor Activity  Psychomotor Activity:Psychomotor Activity: Normal  Assets  Assets: Communication Skills; Desire for Improvement; Housing   Sleep  Sleep:Sleep: Fair  Physical Exam: Physical Exam Constitutional:      General: She is not in acute distress.    Appearance: Normal appearance. She is not ill-appearing, toxic-appearing or diaphoretic.  HENT:     Head: Normocephalic.   Neurological:     Mental Status: She is alert.  Psychiatric:        Mood and Affect: Mood normal.        Behavior: Behavior normal.    Review of Systems  Psychiatric/Behavioral:  Positive for depression. Negative for hallucinations, substance abuse and suicidal ideas. The patient is nervous/anxious and has insomnia.    Blood pressure (!) 143/65, pulse 62, temperature 98.2 F (36.8 C), resp. rate 18, height '5\' 2"'$  (1.575 m), weight 52.6 kg, SpO2 99 %. Body mass index is 21.21 kg/m.  Treatment Plan Summary:  TYEASHA EBBS is a 66 year old female who presented into [facility] emergency department with head injury s/p fall likely secondary to seizure.  Psych consult was placed for history of schizoaffective disorder, plan for safe disposition.  Patient does have a history of schizoaffective depressive type, currently controlled with sertraline and aripiprazole by previous inpatient  psychiatric provider.  This morning into assessment patient is more alert and engaging, and she makes good eye contact.  She does endorse worsening depressive symptoms and visual hallucinations, secondary to medication changes.  She reports improvement with branded medication versus generic.  Her current symptoms are consistent with schizoaffective disorder, which seems reasonable considering her most recent admission, medication changes/adjustment, psychosocial stressors.  Patient has history of suicidal thoughts, most recently 4 weeks ago, however denies any acute thoughts at this time.  She denies any recent history of suicide attempts, last suicide attempt in November 2007.  She contributes her stability to calm medication compliance, therapy, and coping skills.  Patient denied any suicidal thoughts or suicidal intent.  Patient had no physical complaints.  Although future psychiatric events cannot be predicted, I do not feel that patient needs to be put under IVC for her history of visual hallucinations and suicidal thoughts nor do we recommend inpatient psychiatric care.    Schizoaffective: Continue Abilify to '5mg'$  PO daily, there is room to increase this dose if symptoms do return.  Recommend outpatient mental health follow up; Social Work resources upon discharge Labs baseline labs obtained, appear to be within normal limits and stable at this time to include CMP and CBC with differential.  Patient with low hemoglobin of 10.2>11.  Disposition:  Psychiatry consult service will sign off.  Do not perceive any of these changes or barriers that will not prevent patient's discharge.   Suella Broad, FNP 01/12/2022 10:41 AM

## 2022-01-12 NOTE — TOC Transition Note (Addendum)
Transition of Care [facility]) - CM/SW Discharge Note   Patient Details  Name: [name] MRN: [id number] Date of Birth: 19-Dec-1955  [facility] of [facility]) CM/SW Contact:  Geralynn Ochs, LCSW Phone Number: 01/12/2022, 4:13 PM   Clinical Narrative:   CSW received insurance authorization for patient to admit to [facility], confirmed bed available at [facility]. MD updated, CSW sent discharge paperwork. [facility] sent paperwork to daughter, [name], still awaiting paperwork to be completed. CSW spoke with [name] to ensure paperwork was received, and she indicated that she will complete it as soon as she is able. Patient unable to admit to [facility] unless paperwork is completed first, daughter aware. CSW updated MD with possible barrier to discharge. Transport tentatively scheduled with [facility] for after 6 this evening in the hopes that paperwork is completed before then, but may need canceled if paperwork is not complete.  Nurse to call report to [phone].  UPDATE 5:12 PM: Paperwork was not received by [facility], so they are unable to admit today. CSW canceled [facility] and updated RN and MD. CSW to follow.    Final next level of care: Skilled Nursing Facility Barriers to Discharge: Barriers Resolved   Patient Goals and CMS Choice Patient states their goals for this hospitalization and ongoing recovery are:: Pt would like to be more independent. CMS Medicare.gov Compare Post Acute Care list provided to:: Patient Choice offered to / list presented to : Patient  Discharge Placement              Patient chooses bed at: [facility] Patient to be transferred to facility by: [place] Name of family member notified: [name] Patient and family notified of of transfer: 01/12/22  Discharge Plan and Services     Post Acute Care Choice: [place]                               Social Determinants of Health (SDOH) Interventions     Readmission  Risk Interventions    10/18/2021   11:52 AM  Readmission Risk Prevention Plan  Transportation Screening Complete  PCP or Specialist Appt within 5-7 Days Complete  Home Care Screening Complete  Medication Review (RN CM) Complete

## 2022-01-12 NOTE — Discharge Summary (Addendum)
[facility] Discharge Summary  Patient name: [name] Medical record number: [id number] Date of birth: [birth year] Age: 66 y.o. Gender: female Date of Admission: 01/05/2022  Date of Discharge: 01/12/2022 Admitting Physician: Donney Dice, DO  Primary Care Provider: Bartholome Bill, MD Consultants: Psychiatry, neurology, neurosurgery  Indication for Hospitalization: AMS  Brief Hospital Course:  [name] is a 66 y.o. female presenting with AMS and head injury after a fall.  Syncope  L frontal hemorrhagic contusion In the ED after traumatic fall from LOC resulting in head laceration.  CT head imaging revealed 2 areas of focal hemorrhage.  No cervical fractures identified. Neurosurgery consulted and did not recommend further intervention. Echo with EF 60-65%. With recent new onset seizures, unclear medicine administration at home, description of postictal state after fall, and incontinence during event; this is most likely due to to seizure. Patient picked up vimpat from pharmacy 3 days ago. EEG no seizures. Neurology was consulted and recommended increasing vimpat to '150mg'$  BID and that she has to be seizure free before she can resume driving.   Altered mental status Patient appears to be back at baseline. History of schizoaffective disorder, bipolar type.  She reports being stable on her home medications.  Recent hospitalization for AMS patient was confused walking middle of the road.  This resulted in the patient having a seizure with intubation in the ICU. UA concerning for UTI (+nitrite, LE). Ammonia wnl.  Stable and at baseline at time of discharge.  Schizoaffective disorder On 10/2 nursing staff alerted medical team that patient was having anxiety and concerned that medical staff was going to harm her.  We consulted psych and they recommended to increase Abilify to 5 mg, and that she was safe to discharge to SNF.  UTI (urinary tract  infection) UA concerning for possible infection.  Known penicillin allergy. She also had dysuria prior to fall. Treating as symptomatic UTI. She was started on cefepime and transitioned to keflex for UTI.  Resolved at time of discharge.  AKI States she felt normal and ate and drank normal until the day of admit.  Admits little to no PO intake for 12 hours prior to fall. Unclear why she was off her feeding. Denies fever or vomiting. Presume AKI due to dehydration which would also contribute to syncope. She was given 1L bolus and home lisinopril-HCTZ was held.   Chest pain New onset chest pain beginning in the ED. Echo wnl as above. EKG within normal limits.  This most likely this is due to her known GERD. She denied chest pain at time of discharge.   Follow-up for outpatient: Ensure no driving for 6 months, has to be seizure free before resuming driving F/u iron deficiency anemia, due for colonoscopy  HTN medication management. Medication held at discharge d/t normotensive pressures w/o medication. Please ensure patient has adequate behavioral health follow-up 5.  Consider BMP at follow up  Discharge Diagnoses/Problem List:  Principal Problem:   Bleeding in brain [facility]) Active Problems:   Seizures ([place])   Schizoaffective disorder, depressive type ([place])   HTN (hypertension)   AMS (altered mental status)   Anemia  Disposition: SNF  Discharge Condition: Stable  Discharge Exam: Per my progress note: General: Nonacute distress, sitting and eating Cardiovascular: Regular rate and rhythm, no MRG Respiratory: CTAB, normal work of breathing on room air  Significant Procedures: None  Significant Labs and Imaging:  Recent Labs  Lab 01/12/22 0556  WBC 5.6  HGB  11.0*  HCT 32.3*  PLT 210   Recent Labs  Lab 01/12/22 0955  NA 135  K 3.8  CL 104  CO2 23  GLUCOSE 183*  BUN 7*  CREATININE 0.62  CALCIUM 9.8  ALKPHOS 69  AST 28  ALT 18  ALBUMIN 3.3*   Results/Tests Pending at  Time of Discharge: None  Discharge Medications:  Allergies as of 01/12/2022       Reactions   Penicillins Rash      Sulfa Antibiotics Rash        Medication List     STOP taking these medications    benztropine 1 MG tablet Commonly known as: COGENTIN   dicyclomine 20 MG tablet Commonly known as: BENTYL   gabapentin 300 MG capsule Commonly known as: NEURONTIN   lisinopril-hydrochlorothiazide 20-12.5 MG tablet Commonly known as: ZESTORETIC   sucralfate 1 g tablet Commonly known as: CARAFATE   traZODone 100 MG tablet Commonly known as: DESYREL   ziprasidone 80 MG capsule Commonly known as: GEODON       TAKE these medications    acidophilus Caps capsule Take 2 capsules by mouth daily.   ARIPiprazole 5 MG tablet Commonly known as: ABILIFY Take 1 tablet (5 mg total) by mouth daily. Start taking on: January 13, 2022 What changed:  medication strength how much to take   Lacosamide 150 MG Tabs Take 1 tablet (150 mg total) by mouth 2 (two) times daily. What changed:  medication strength how much to take   meclizine 25 MG tablet Commonly known as: ANTIVERT Take 25 mg by mouth daily as needed for dizziness.   omeprazole 40 MG capsule Commonly known as: PRILOSEC Take 40 mg by mouth in the morning and at bedtime.   ondansetron 4 MG disintegrating tablet Commonly known as: ZOFRAN-ODT Take 4 mg by mouth every 8 (eight) hours as needed for nausea or vomiting.   Premarin vaginal cream Generic drug: conjugated estrogens Place 1 Application vaginally daily.   sertraline 100 MG tablet Commonly known as: ZOLOFT Take 1 tablet (100 mg total) by mouth 2 (two) times daily.        Discharge Instructions: Please refer to Patient Instructions section of EMR for full details.  Patient was counseled important signs and symptoms that should prompt return to medical care, changes in medications, dietary instructions, activity restrictions, and follow up appointments.    Follow-Up Appointments:   Neurology appointment 03/06/2022 at 2:30 PM  Leslie Dales, DO 01/12/2022, 1:20 PM PGY-1, [facility] Upper Level Addendum:  I have seen and evaluated this patient along with Dr. Nelda Bucks and reviewed the above note, making necessary revisions as appropriate.  I agree with the medical decision making and physical exam as noted above.  Gerrit Heck, MD PGY-2 [facility]

## 2022-01-12 NOTE — Progress Notes (Signed)
Occupational Therapy Treatment Patient Details Name: [name] MRN: [id number] DOB: Jan 19, 1956 Today's Date: 01/12/2022   History of present illness 66 yo female was admitted after being found wandering near her home, with a L forehead laceration.  Hypotensive and had apparently fallen, with HA and no clear history of what had led to the situation.  Pt has clear EEG, L frontal lobe contusion but no surgery needed.  Does note chest pain, possibly had a seizure and had AMS.  Has UTI, AKI and low K+ and Magnesium.   PMHx:  seizures,   OT comments  Pt progressing well towards OT goals.  Patient completing transfers and mobility in room with min guard for safety, cueing to use RW for mobility initially.  Shower transfers with min guard, bathing with supervision seated.  Educated on safety, energy conservation.  Continues to require min cueing for problem solving, slow processing throughout session. Will follow acutely.    Recommendations for follow up therapy are one component of a multi-disciplinary discharge planning process, led by the attending physician.  Recommendations may be updated based on patient status, additional functional criteria and insurance authorization.    Follow Up Recommendations  Skilled nursing-short term rehab (<3 hours/day)    Assistance Recommended at Discharge Frequent or constant Supervision/Assistance  Patient can return home with the following  A little help with walking and/or transfers;A little help with bathing/dressing/bathroom;Direct supervision/assist for medications management;Assistance with cooking/housework;Direct supervision/assist for financial management;Assist for transportation   Equipment Recommendations  Other (comment) (deferred, plan dc to SNF)    Recommendations for Other Services      Precautions / Restrictions Precautions Precautions: Fall Restrictions Weight Bearing Restrictions: No       Mobility Bed Mobility Overal bed  mobility: Needs Assistance Bed Mobility: Supine to Sit     Supine to sit: Supervision          Transfers Overall transfer level: Needs assistance Equipment used: Rolling walker (2 wheels) Transfers: Sit to/from Stand Sit to Stand: Supervision           General transfer comment: cueing to recall use of RW, no physical assist required     Balance Overall balance assessment: Needs assistance Sitting-balance support: Feet supported, No upper extremity supported Sitting balance-Leahy Scale: Good     Standing balance support: Bilateral upper extremity supported, No upper extremity supported, During functional activity Standing balance-Leahy Scale: Fair Standing balance comment: BUE dynamically during mobility, but engaging in ADLs without UE support and close supervision                           ADL either performed or assessed with clinical judgement   ADL Overall ADL's : Needs assistance/impaired     Grooming: Supervision/safety;Standing   Upper Body Bathing: Supervision/ safety;Sitting   Lower Body Bathing: Supervison/ safety;Sit to/from stand   Upper Body Dressing : Supervision/safety;Sitting   Lower Body Dressing: Supervision/safety;Sit to/from stand   Toilet Transfer: Supervision/safety;Ambulation;Rolling walker (2 wheels)   Toileting- Clothing Manipulation and Hygiene: Supervision/safety;Sit to/from stand   Tub/ Banker: Walk-in shower;Ambulation;BSC/3in1;Min guard;Rolling walker (2 wheels)   Functional mobility during ADLs: Supervision/safety;Rolling walker (2 wheels)      Extremity/Trunk Assessment              Vision       Perception     Praxis      Cognition Arousal/Alertness: Awake/alert Behavior During Therapy: Flat affect Overall Cognitive Status: No family/caregiver present to  determine baseline cognitive functioning Area of Impairment: Memory, Following commands, Awareness, Problem solving, Safety/judgement                      Memory: Decreased short-term memory Following Commands: Follows one step commands consistently, Follows one step commands with increased time, Follows multi-step commands inconsistently Safety/Judgement: Decreased awareness of safety Awareness: Emergent Problem Solving: Slow processing, Requires verbal cues General Comments: pt following commands but requires minimal cueing for safety.        Exercises      Shoulder Instructions       General Comments pt cleared by RN to take a shower    Pertinent Vitals/ Pain       Pain Assessment Pain Assessment: No/denies pain  Home Living                                          Prior Functioning/Environment              Frequency  Min 2X/week        Progress Toward Goals  OT Goals(current goals can now be found in the care plan section)  Progress towards OT goals: Progressing toward goals  Acute Rehab OT Goals Patient Stated Goal: rehab OT Goal Formulation: With patient Time For Goal Achievement: 01/21/22 Potential to Achieve Goals: Good  Plan Discharge plan remains appropriate;Frequency remains appropriate    Co-evaluation                 AM-PAC OT "6 Clicks" Daily Activity     Outcome Measure   Help from another person eating meals?: A Little Help from another person taking care of personal grooming?: A Little Help from another person toileting, which includes using toliet, bedpan, or urinal?: A Little Help from another person bathing (including washing, rinsing, drying)?: A Little Help from another person to put on and taking off regular upper body clothing?: A Little Help from another person to put on and taking off regular lower body clothing?: A Little 6 Click Score: 18    End of Session Equipment Utilized During Treatment: Rolling walker (2 wheels)  OT Visit Diagnosis: Unsteadiness on feet (R26.81);Other abnormalities of gait and mobility (R26.89);Muscle  weakness (generalized) (M62.81)   Activity Tolerance Patient tolerated treatment well   Patient Left with call bell/phone within reach;Other (comment);with nursing/sitter in room (sitting EOB)   Nurse Communication Mobility status        Time: 1308-6578 OT Time Calculation (min): 23 min  Charges: OT General Charges $OT Visit: 1 Visit OT Treatments $Self Care/Home Management : 23-37 mins  [place] Office [phone]   Delight Stare 01/12/2022, 10:34 AM

## 2022-01-13 NOTE — Discharge Summary (Signed)
[facility] Discharge Summary  Patient name: [name] Medical record number: [id number] Date of birth: [birth year] Age: 66 y.o. Gender: female Date of Admission: 01/05/2022  Date of Discharge: 01/12/2022 Admitting Physician: Donney Dice, DO  Primary Care Provider: Bartholome Bill, MD Consultants: Psychiatry, neurology, neurosurgery  Indication for Hospitalization: AMS  Brief Hospital Course:  [name] is a 66 y.o. female presenting with AMS and head injury after a fall.  Syncope  L frontal hemorrhagic contusion In the ED after traumatic fall from LOC resulting in head laceration.  CT head imaging revealed 2 areas of focal hemorrhage.  No cervical fractures identified. Neurosurgery consulted and did not recommend further intervention. Echo with EF 60-65%. With recent new onset seizures, unclear medicine administration at home, description of postictal state after fall, and incontinence during event; this is most likely due to to seizure. Patient picked up vimpat from pharmacy 3 days ago. EEG no seizures. Neurology was consulted and recommended increasing vimpat to '150mg'$  BID and that she has to be seizure free before she can resume driving.   Altered mental status Patient appears to be back at baseline. History of schizoaffective disorder, bipolar type.  She reports being stable on her home medications.  Recent hospitalization for AMS patient was confused walking middle of the road.  This resulted in the patient having a seizure with intubation in the ICU. UA concerning for UTI (+nitrite, LE). Ammonia wnl.  Stable and at baseline at time of discharge.  Schizoaffective disorder On 10/2 nursing staff alerted medical team that patient was having anxiety and concerned that medical staff was going to harm her.  We consulted psych and they recommended to increase Abilify to 5 mg, and that she was safe to discharge to SNF.  UTI (urinary tract  infection) UA concerning for possible infection.  Known penicillin allergy. She also had dysuria prior to fall. Treating as symptomatic UTI. She was started on cefepime and transitioned to keflex for UTI.  Resolved at time of discharge.  AKI States she felt normal and ate and drank normal until the day of admit.  Admits little to no PO intake for 12 hours prior to fall. Unclear why she was off her feeding. Denies fever or vomiting. Presume AKI due to dehydration which would also contribute to syncope. She was given 1L bolus and home lisinopril-HCTZ was held. Good PO intake while admitted. AKI Resolved.  Chest pain New onset chest pain beginning in the ED. Echo wnl as above. EKG within normal limits.  This most likely this is due to her known GERD. She denied chest pain at time of discharge.   Follow-up for outpatient: Ensure no driving for 6 months, has to be seizure free before resuming driving F/u iron deficiency anemia, due for colonoscopy  HTN medication management. Medication held at discharge d/t normotensive pressures w/o medication. Please ensure patient has adequate behavioral health follow-up 5.  Consider BMP at follow up  Discharge Diagnoses/Problem List:  Principal Problem:   Bleeding in brain [facility]) Active Problems:   Seizures ([place])   Schizoaffective disorder, depressive type ([place])   HTN (hypertension)   AMS (altered mental status)   Anemia  Disposition: SNF  Discharge Condition: Stable  Discharge Exam: Per my progress note: General: Nonacute distress, sitting and eating Cardiovascular: Regular rate and rhythm, no MRG Respiratory: CTAB, normal work of breathing on room air  Significant Procedures: None  Significant Labs and Imaging:  Recent Labs  Lab 01/12/22  0556  WBC 5.6  HGB 11.0*  HCT 32.3*  PLT 210    Recent Labs  Lab 01/12/22 0955  NA 135  K 3.8  CL 104  CO2 23  GLUCOSE 183*  BUN 7*  CREATININE 0.62  CALCIUM 9.8  ALKPHOS 69  AST 28  ALT 18   ALBUMIN 3.3*    Results/Tests Pending at Time of Discharge: None  Discharge Medications:  Allergies as of 01/13/2022       Reactions   Penicillins Rash      Sulfa Antibiotics Rash        Medication List     STOP taking these medications    benztropine 1 MG tablet Commonly known as: COGENTIN   dicyclomine 20 MG tablet Commonly known as: BENTYL   gabapentin 300 MG capsule Commonly known as: NEURONTIN   lisinopril-hydrochlorothiazide 20-12.5 MG tablet Commonly known as: ZESTORETIC   sucralfate 1 g tablet Commonly known as: CARAFATE   traZODone 100 MG tablet Commonly known as: DESYREL   ziprasidone 80 MG capsule Commonly known as: GEODON       TAKE these medications    acidophilus Caps capsule Take 2 capsules by mouth daily.   ARIPiprazole 5 MG tablet Commonly known as: ABILIFY Take 1 tablet (5 mg total) by mouth daily. What changed:  medication strength how much to take   Lacosamide 150 MG Tabs Take 1 tablet (150 mg total) by mouth 2 (two) times daily. What changed:  medication strength how much to take   meclizine 25 MG tablet Commonly known as: ANTIVERT Take 25 mg by mouth daily as needed for dizziness.   omeprazole 40 MG capsule Commonly known as: PRILOSEC Take 40 mg by mouth in the morning and at bedtime.   ondansetron 4 MG disintegrating tablet Commonly known as: ZOFRAN-ODT Take 4 mg by mouth every 8 (eight) hours as needed for nausea or vomiting.   Premarin vaginal cream Generic drug: conjugated estrogens Place 1 Application vaginally daily.   sertraline 100 MG tablet Commonly known as: ZOLOFT Take 1 tablet (100 mg total) by mouth 2 (two) times daily.        Discharge Instructions: Please refer to Patient Instructions section of EMR for full details.  Patient was counseled important signs and symptoms that should prompt return to medical care, changes in medications, dietary instructions, activity restrictions, and follow up  appointments.   Follow-Up Appointments:  Contact information for after-discharge care     Destination     [facility] Preferred SNF .   Service: Skilled Nursing Contact information: [place] [phone]                     Neurology appointment 03/06/2022 at 2:30 PM  Leslie Dales, DO 01/13/2022, 1:22 PM PGY-1, [facility] Upper Level Addendum:  I have seen and evaluated this patient along with Dr. Nelda Bucks and reviewed the above note, making necessary revisions as appropriate.  I agree with the medical decision making and physical exam as noted above.  Gerrit Heck, MD PGY-2 [facility] Res[facility]

## 2022-01-13 NOTE — Progress Notes (Signed)
     Daily Progress Note Intern Pager: [phone]  Patient name: [name] Medical record number: [id number] Date of birth: 06/18/1955 Age: 66 y.o. Gender: female  Primary Care Provider: Bartholome Bill, MD Consultants: Neurology, neurosurgery, psychiatry Code Status: Full  Pt Overview and Major Events to Date:  9/28: Admitted to from [facility] 9/30: Medically stable for discharge to SNF 10/3: Psychiatry referral placed, discharge planning 10/4: Patient is medically cleared by both psychiatry and family medicine for discharge to SNF  Assessment and Plan: *Medically stable for discharge to SNF* [name] is a 66 year old female who presented with head injury status post fall.  Seizure versus syncope.  She did not require surgical intervention, no seizure since admission.  Past medical history includes seizures, schizoaffective disorder, hypertension.  * Bleeding in brain ([place]) Stable - Medically stable for discharge to SNF - Continue PT/OT  Seizures ([facility]) Stable - Vimpat '150mg'$  BID - Seizure precautions - No driving for 6 months  - Ativan as needed for breakthrough seizures  Schizoaffective disorder, depressive type ([place]) Stable.  Safe for discharge to SNF. - Continue Abilify, Zoloft, trazodone  HTN (hypertension) Normotensive. - hold home lisinopril-HCTZ - monitor BP  Chest pain-resolved as of 01/08/2022 Echo WNL, troponin neg, EKG stable. No further workup indicated at this time.    FEN/GI: Regular PPx: SCDs, ambulation Dispo:SNF today. Barriers include: paperwork needs to be signed.   Subjective:  Patient was ambulating in the hallway.  She reports that she is ready to go out of here, we discussed the need for paperwork to be signed.  She has no other questions for [place].  Objective: Temp:  [97.4 F (36.3 C)-98.9 F (37.2 C)] 97.4 F (36.3 C) (10/06 0622) Pulse Rate:  [56-68] 68 (10/06 0622) Resp:  [14-18] 16 (10/06 0622) BP: (124-171)/(59-80) 161/70  (10/06 0622) SpO2:  [95 %-99 %] 95 % (10/06 0622) Physical Exam: General: Not in acute distress, ambulating in the hallway Cardio: Ambulating in the hallway, with walker Respiratory: Normal work of breathing on room air Extremities: Moving all 4 extremities, no edema Psych: Normal thought and behavior  Laboratory: Most recent CBC Lab Results  Component Value Date   WBC 5.6 01/12/2022   HGB 11.0 (L) 01/12/2022   HCT 32.3 (L) 01/12/2022   MCV 86.8 01/12/2022   PLT 210 01/12/2022   Most recent BMP    Latest Ref Rng & Units 01/12/2022    9:55 AM  BMP  Glucose 70 - 99 mg/dL 183   BUN 8 - 23 mg/dL 7   Creatinine 0.44 - 1.00 mg/dL 0.62   Sodium 135 - 145 mmol/L 135   Potassium 3.5 - 5.1 mmol/L 3.8   Chloride 98 - 111 mmol/L 104   CO2 22 - 32 mmol/L 23   Calcium 8.9 - 10.3 mg/dL 9.8     Leslie Dales, DO 01/13/2022, 7:42 AM  PGY-1, [place] Intern pager: [phone], text pages welcome Secure chat group [place]

## 2022-01-13 NOTE — Progress Notes (Signed)
Mobility Specialist Progress Note   01/13/22 1021  Mobility  Activity Ambulated with assistance in hallway;Dangled on edge of bed  Activity Response Tolerated well  Distance Ambulated (ft) 300 ft  Level of Assistance Standby assist, set-up cues, supervision of patient - no hands on  Assistive Device Front wheel walker  Range of Motion/Exercises Active;All extremities   Patient received standing at nurses station questioning status of departure to discharge disposition.  Agreed to continue ambulation and writer to seek answers upon returning to room. Ambulated supervision level with slow steady gait. Returned to room without complaint or incident. Pt was then updated with appropriate information regarding her discharge status. CSW notified. Was left in dangling EOB with all needs met, call bell in reach.   [place] Shaneece Stockburger, [place], [employer]  UGQBV:[phone] Office: [phone]

## 2022-01-13 NOTE — Progress Notes (Signed)
Physical Therapy Treatment Patient Details Name: [name] MRN: [id number] DOB: June 15, 1955 Today's Date: 01/13/2022   History of Present Illness 66 yo female was admitted after being found wandering near her home, with a L forehead laceration.  Hypotensive and had apparently fallen, with HA and no clear history of what had led to the situation.  Pt has clear EEG, L frontal lobe contusion but no surgery needed.  Does note chest pain, possibly had a seizure and had AMS.  Has UTI, AKI and low K+ and Magnesium.   PMHx:  seizures,    PT Comments    Pt recently finished walking with mobility tech but eager to participate in physical therapy session focusing on static and dynamic balance. Completed 30 second chair stand test - completed 6 stands using hands on chair (unable to rise without UE support.) Frequent imbalance with tandem stand rt foot forward. Requires SBA-CGA for dynamic balance activities. Oriented however with difficulty following commands for simple motor tasks consistently during session.  Patient will continue to benefit from skilled physical therapy services to further improve independence with functional mobility.    Recommendations for follow up therapy are one component of a multi-disciplinary discharge planning process, led by the attending physician.  Recommendations may be updated based on patient status, additional functional criteria and insurance authorization.  Follow Up Recommendations  Skilled nursing-short term rehab (<3 hours/day) Can patient physically be transported by private vehicle: Yes   Assistance Recommended at Discharge Frequent or constant Supervision/Assistance  Patient can return home with the following A lot of help with bathing/dressing/bathroom;Assistance with cooking/housework;Direct supervision/assist for medications management;Direct supervision/assist for financial management;Assist for transportation;Help with stairs or ramp for entrance;A little  help with walking and/or transfers   Equipment Recommendations  None recommended by PT    Recommendations for Other Services       Precautions / Restrictions Precautions Precautions: Fall Precaution Comments: monitor vitals Restrictions Weight Bearing Restrictions: No     Mobility  Bed Mobility               General bed mobility comments: Standing in room when PT entered.    Transfers Overall transfer level: Needs assistance Equipment used: Rolling walker (2 wheels) Transfers: Sit to/from Stand Sit to Stand: Supervision           General transfer comment: Cues for hand placement, supervision for safety.    Ambulation/Gait                   Stairs             Wheelchair Mobility    Modified Rankin (Stroke Patients Only)       Balance Overall balance assessment: Needs assistance Sitting-balance support: Feet supported, No upper extremity supported Sitting balance-Leahy Scale: Good Sitting balance - Comments: no support while sitting EOB and donning socks   Standing balance support: Bilateral upper extremity supported, No upper extremity supported, During functional activity Standing balance-Leahy Scale: Fair Standing balance comment: BUE dynamically during mobility, but engaging in ADLs without UE support and close supervision               High Level Balance Comments: Dynamic gait challenges along rail in hallway- performed lateral steps, and backwards steps with fading hand support 10' x2 laps ea            Cognition Arousal/Alertness: Awake/alert Behavior During Therapy: Flat affect Overall Cognitive Status: No family/caregiver present to determine baseline cognitive functioning Area of Impairment: Memory,  Following commands, Awareness, Problem solving, Safety/judgement                     Memory: Decreased short-term memory Following Commands: Follows one step commands inconsistently Safety/Judgement:  Decreased awareness of safety Awareness: Emergent Problem Solving: Slow processing, Requires verbal cues General Comments: Requires intermittent cues to follow simple motor commands and instruction with basic exercises.        Exercises Other Exercises Other Exercises: Sit<>stand x10 with use of hands on chair Other Exercises: WBOS, horizontal head turns x10, trunk twist x10, eyes closed x10" Other Exercises: Stationary march progression from bil HHA, to [place], to no HHA CGA Other Exercises: romberg stance, semi tandem, tandem stance x 15" ea per foot - LOB frequent in tandem with Rt foot forward.    General Comments        Pertinent Vitals/Pain Pain Assessment Pain Assessment: No/denies pain Pain Intervention(s): Monitored during session    Home Living                          Prior Function            PT Goals (current goals can now be found in the care plan section) Acute Rehab PT Goals Patient Stated Goal: to be discharged PT Goal Formulation: With patient Time For Goal Achievement: 01/20/22 Potential to Achieve Goals: Good Progress towards PT goals: Progressing toward goals    Frequency    Min 3X/week      PT Plan Current plan remains appropriate    Co-evaluation              AM-PAC PT "6 Clicks" Mobility   Outcome Measure  Help needed turning from your back to your side while in a flat bed without using bedrails?: None Help needed moving from lying on your back to sitting on the side of a flat bed without using bedrails?: None Help needed moving to and from a bed to a chair (including a wheelchair)?: A Little Help needed standing up from a chair using your arms (e.g., wheelchair or bedside chair)?: A Little Help needed to walk in hospital room?: A Little Help needed climbing 3-5 steps with a railing? : A Little 6 Click Score: 20    End of Session Equipment Utilized During Treatment: Gait belt Activity Tolerance: Patient tolerated  treatment well Patient left: in bed;with call bell/phone within reach;with bed alarm set   PT Visit Diagnosis: Muscle weakness (generalized) (M62.81);Pain Pain - Right/Left: Left Pain - part of body:  (chest)     Time: 1137-1200 PT Time Calculation (min) (ACUTE ONLY): 23 min  Charges:  $Neuromuscular Re-education: 23-37 mins                     Candie Mile, PT, DPT Physical Therapist Acute [facility] [place]    [name] 01/13/2022, 12:12 PM

## 2022-01-13 NOTE — TOC Transition Note (Signed)
Transition of Care [facility]) - CM/SW Discharge Note   Patient Details  Name: [name] MRN: [id number] Date of Birth: 07/15/1955  Transition of Care [facility]) CM/SW Contact:  Geralynn Ochs, LCSW Phone Number: 01/13/2022, 1:42 PM   Clinical Narrative:   CSW spoke with daughter this morning to answer questions that she had about paperwork, and redirected her to discuss with [facility] representative. CSW updated by [facility] that paperwork has been received, patient can admit. CSW updated MD and RN, and called [facility] for transport.  Nurse to call report to [phone], Room 3218    Final next level of care: Skilled Nursing Facility Barriers to Discharge: Barriers Resolved   Patient Goals and CMS Choice Patient states their goals for this hospitalization and ongoing recovery are:: Pt would like to be more independent. CMS Medicare.gov Compare Post Acute Care list provided to:: Patient Choice offered to / list presented to : Patient  Discharge Placement              Patient chooses bed at: [facility] Patient to be transferred to facility by: [place] Name of family member notified: [name] Patient and family notified of of transfer: 01/13/22  Discharge Plan and Services     Post Acute Care Choice: [place]                               Social Determinants of Health (SDOH) Interventions     Readmission Risk Interventions    10/18/2021   11:52 AM  Readmission Risk Prevention Plan  Transportation Screening Complete  PCP or Specialist Appt within 5-7 Days Complete  Home Care Screening Complete  Medication Review (RN CM) Complete

## 2022-01-24 ENCOUNTER — Emergency Department (HOSPITAL_COMMUNITY): Payer: 59

## 2022-01-24 ENCOUNTER — Encounter (HOSPITAL_COMMUNITY): Payer: Self-pay

## 2022-01-24 ENCOUNTER — Emergency Department (HOSPITAL_COMMUNITY)
Admission: EM | Admit: 2022-01-24 | Discharge: 2022-01-26 | Disposition: A | Discharge status: Other Acute Inpatient Hospital | Payer: 59 | Attending: Emergency Medicine | Admitting: Emergency Medicine

## 2022-01-24 DIAGNOSIS — F32A Depression, unspecified: Secondary | ICD-10-CM

## 2022-01-24 DIAGNOSIS — F4310 Post-traumatic stress disorder, unspecified: Secondary | ICD-10-CM | POA: Diagnosis not present

## 2022-01-24 DIAGNOSIS — Z79899 Other long term (current) drug therapy: Secondary | ICD-10-CM | POA: Insufficient documentation

## 2022-01-24 DIAGNOSIS — Z20822 Contact with and (suspected) exposure to covid-19: Secondary | ICD-10-CM | POA: Insufficient documentation

## 2022-01-24 DIAGNOSIS — R300 Dysuria: Secondary | ICD-10-CM | POA: Insufficient documentation

## 2022-01-24 DIAGNOSIS — X838XXA Intentional self-harm by other specified means, initial encounter: Secondary | ICD-10-CM | POA: Diagnosis not present

## 2022-01-24 DIAGNOSIS — S0181XA Laceration without foreign body of other part of head, initial encounter: Secondary | ICD-10-CM

## 2022-01-24 DIAGNOSIS — F251 Schizoaffective disorder, depressive type: Secondary | ICD-10-CM | POA: Diagnosis not present

## 2022-01-24 DIAGNOSIS — S0990XA Unspecified injury of head, initial encounter: Secondary | ICD-10-CM | POA: Diagnosis present

## 2022-01-24 DIAGNOSIS — S0101XA Laceration without foreign body of scalp, initial encounter: Secondary | ICD-10-CM | POA: Insufficient documentation

## 2022-01-24 LAB — CBC WITH DIFFERENTIAL/PLATELET
Abs Immature Granulocytes: 0.02 10*3/uL (ref 0.00–0.07)
Basophils Absolute: 0 10*3/uL (ref 0.0–0.1)
Basophils Relative: 1 %
Eosinophils Absolute: 0 10*3/uL (ref 0.0–0.5)
Eosinophils Relative: 0 %
HCT: 30.3 % — ABNORMAL LOW (ref 36.0–46.0)
Hemoglobin: 10.5 g/dL — ABNORMAL LOW (ref 12.0–15.0)
Immature Granulocytes: 0 %
Lymphocytes Relative: 14 %
Lymphs Abs: 1 10*3/uL (ref 0.7–4.0)
MCH: 29.2 pg (ref 26.0–34.0)
MCHC: 34.7 g/dL (ref 30.0–36.0)
MCV: 84.2 fL (ref 80.0–100.0)
Monocytes Absolute: 0.4 10*3/uL (ref 0.1–1.0)
Monocytes Relative: 6 %
Neutro Abs: 6 10*3/uL (ref 1.7–7.7)
Neutrophils Relative %: 79 %
Platelets: 201 10*3/uL (ref 150–400)
RBC: 3.6 MIL/uL — ABNORMAL LOW (ref 3.87–5.11)
RDW: 14.6 % (ref 11.5–15.5)
WBC: 7.5 10*3/uL (ref 4.0–10.5)
nRBC: 0 % (ref 0.0–0.2)

## 2022-01-24 LAB — COMPREHENSIVE METABOLIC PANEL
ALT: 15 U/L (ref 0–44)
AST: 24 U/L (ref 15–41)
Albumin: 3.5 g/dL (ref 3.5–5.0)
Alkaline Phosphatase: 65 U/L (ref 38–126)
Anion gap: 8 (ref 5–15)
BUN: 10 mg/dL (ref 8–23)
CO2: 20 mmol/L — ABNORMAL LOW (ref 22–32)
Calcium: 9.1 mg/dL (ref 8.9–10.3)
Chloride: 105 mmol/L (ref 98–111)
Creatinine, Ser: 0.6 mg/dL (ref 0.44–1.00)
GFR, Estimated: >60 mL/min (ref >60)
Glucose, Bld: 147 mg/dL — ABNORMAL HIGH (ref 70–99)
Potassium: 2.7 mmol/L — CL (ref 3.5–5.1)
Sodium: 133 mmol/L — ABNORMAL LOW (ref 135–145)
Total Bilirubin: 1.1 mg/dL (ref 0.3–1.2)
Total Protein: 6.8 g/dL (ref 6.5–8.1)

## 2022-01-24 LAB — ACETAMINOPHEN LEVEL: Acetaminophen (Tylenol), Serum: <10 ug/mL — ABNORMAL LOW (ref 10–30)

## 2022-01-24 LAB — MAGNESIUM: Magnesium: 1.7 mg/dL (ref 1.7–2.4)

## 2022-01-24 LAB — SALICYLATE LEVEL: Salicylate Lvl: <7 mg/dL — ABNORMAL LOW (ref 7.0–30.0)

## 2022-01-24 LAB — ETHANOL: Alcohol, Ethyl (B): <10 mg/dL (ref <10)

## 2022-01-24 MED ORDER — MORPHINE SULFATE (PF) 4 MG/ML IV SOLN
4.0000 mg | Freq: Once | INTRAVENOUS | Status: AC
  Administered 2022-01-25: 4 mg via INTRAVENOUS
  Filled 2022-01-24: qty 1

## 2022-01-24 MED ORDER — PANTOPRAZOLE SODIUM 40 MG PO TBEC
40.0000 mg | DELAYED_RELEASE_TABLET | Freq: Every day | ORAL | Status: DC
  Administered 2022-01-25: 40 mg via ORAL
  Filled 2022-01-24: qty 1

## 2022-01-24 MED ORDER — LORAZEPAM 1 MG PO TABS
1.0000 mg | ORAL_TABLET | ORAL | Status: DC | PRN
  Administered 2022-01-25: 1 mg via ORAL
  Filled 2022-01-24: qty 1

## 2022-01-24 MED ORDER — POTASSIUM CHLORIDE CRYS ER 20 MEQ PO TBCR
EXTENDED_RELEASE_TABLET | ORAL | Status: AC
  Filled 2022-01-24: qty 2

## 2022-01-24 MED ORDER — POTASSIUM CHLORIDE CRYS ER 20 MEQ PO TBCR
40.0000 meq | EXTENDED_RELEASE_TABLET | Freq: Once | ORAL | Status: AC
  Administered 2022-01-24: 40 meq via ORAL

## 2022-01-24 MED ORDER — ACETAMINOPHEN 500 MG PO TABS
500.0000 mg | ORAL_TABLET | Freq: Four times a day (QID) | ORAL | Status: DC | PRN
  Administered 2022-01-25: 500 mg via ORAL
  Filled 2022-01-24 (×2): qty 1

## 2022-01-24 MED ORDER — POTASSIUM CHLORIDE 10 MEQ/100ML IV SOLN
10.0000 meq | INTRAVENOUS | Status: AC
  Administered 2022-01-24 (×2): 10 meq via INTRAVENOUS
  Filled 2022-01-24 (×2): qty 100

## 2022-01-24 MED ORDER — POTASSIUM CHLORIDE 10 MEQ/100ML IV SOLN
INTRAVENOUS | Status: AC
  Filled 2022-01-24: qty 100

## 2022-01-24 MED ORDER — POTASSIUM CHLORIDE CRYS ER 20 MEQ PO TBCR
40.0000 meq | EXTENDED_RELEASE_TABLET | Freq: Every day | ORAL | Status: DC
  Administered 2022-01-25: 40 meq via ORAL
  Filled 2022-01-24: qty 2

## 2022-01-24 MED ORDER — LIDOCAINE HCL 2 % IJ SOLN
10.0000 mL | Freq: Once | INTRAMUSCULAR | Status: AC
  Administered 2022-01-24: 200 mg
  Filled 2022-01-24: qty 40

## 2022-01-24 MED ORDER — LORAZEPAM 2 MG/ML IJ SOLN
1.0000 mg | Freq: Once | INTRAMUSCULAR | Status: AC
  Administered 2022-01-24: 1 mg via INTRAVENOUS
  Filled 2022-01-24: qty 1

## 2022-01-24 MED ORDER — RISAQUAD PO CAPS
2.0000 | ORAL_CAPSULE | Freq: Every day | ORAL | Status: DC
  Filled 2022-01-24 (×2): qty 2

## 2022-01-24 MED ORDER — SERTRALINE HCL 50 MG PO TABS
100.0000 mg | ORAL_TABLET | Freq: Two times a day (BID) | ORAL | Status: DC
  Administered 2022-01-25 (×3): 100 mg via ORAL
  Filled 2022-01-24 (×3): qty 2

## 2022-01-24 NOTE — ED Provider Notes (Addendum)
[facility] DEPT Provider Note   CSN: [id number] Arrival date & time: 01/24/22  9798     History  Chief Complaint  Patient presents with   Head Laceration   Suicide Attempt    [name] is a 66 y.o. female history of recent intracranial hemorrhage, UTI, schizoaffective disorder, here presenting with head laceration and possible suicide attempt.  Patient was found wandering in traffic.  She apparently tried to hit her head on the wall and told family that she is suicidal.  When asked about this matter, she states that all her kids are from out of town and she feels very lonely.  She then started crying.  Patient is not on any blood thinners.  Patient was recently admitted for intracranial hemorrhage.  Patient is currently under IVC by police and first exam was filled out.  The history is provided by the patient.       Home Medications Prior to Admission medications   Medication Sig Start Date End Date Taking? Authorizing Provider  acidophilus (RISAQUAD) CAPS capsule Take 2 capsules by mouth daily. 01/09/22   Zola Button, MD  ARIPiprazole (ABILIFY) 5 MG tablet Take 1 tablet (5 mg total) by mouth daily. 01/13/22   Gerrit Heck, MD  lacosamide 150 MG TABS Take 1 tablet (150 mg total) by mouth 2 (two) times daily. 01/09/22   Zola Button, MD  meclizine (ANTIVERT) 25 MG tablet Take 25 mg by mouth daily as needed for dizziness.    [provider]  omeprazole (PRILOSEC) 40 MG capsule Take 40 mg by mouth in the morning and at bedtime.    [provider]  ondansetron (ZOFRAN-ODT) 4 MG disintegrating tablet Take 4 mg by mouth every 8 (eight) hours as needed for nausea or vomiting. 10/05/21   [provider]  PREMARIN vaginal cream Place 1 Application vaginally daily. 12/06/21   [provider]  sertraline (ZOLOFT) 100 MG tablet Take 1 tablet (100 mg total) by mouth 2 (two) times daily. 10/22/21   Aline August, MD       Allergies    Penicillins and Sulfa antibiotics    Review of Systems   Review of Systems  Skin:  Positive for wound.  All other systems reviewed and are negative.   Physical Exam Updated Vital Signs BP (!) 163/103   Pulse 78   Temp 98.3 F (36.8 C)   Resp 15   SpO2 96%  Physical Exam Vitals and nursing note reviewed.  Constitutional:      Comments: Confused and tearful and crying  HENT:     Head: Normocephalic.     Comments: 3 cm laceration L eyebrow     Nose: Nose normal.     Mouth/Throat:     Mouth: Mucous membranes are moist.  Eyes:     Extraocular Movements: Extraocular movements intact.     Pupils: Pupils are equal, round, and reactive to light.  Cardiovascular:     Rate and Rhythm: Normal rate and regular rhythm.     Pulses: Normal pulses.     Heart sounds: Normal heart sounds.  Pulmonary:     Effort: Pulmonary effort is normal.     Breath sounds: Normal breath sounds.  Abdominal:     General: Abdomen is flat.     Palpations: Abdomen is soft.     Comments: No obvious abdominal trauma  Musculoskeletal:        General: Normal range of motion.     Cervical  back: Normal range of motion and neck supple.  Skin:    General: Skin is warm.     Capillary Refill: Capillary refill takes less than 2 seconds.  Neurological:     General: No focal deficit present.     Mental Status: She is oriented to person, place, and time.  Psychiatric:     Comments: Withdrawn and tearful and crying     ED Results / Procedures / Treatments   Labs (all labs ordered are listed, but only abnormal results are displayed) Labs Reviewed  CBC WITH DIFFERENTIAL/PLATELET - Abnormal; Notable for the following components:      Result Value   RBC 3.60 (*)    Hemoglobin 10.5 (*)    HCT 30.3 (*)    All other components within normal limits  COMPREHENSIVE METABOLIC PANEL - Abnormal; Notable for the following components:   Sodium 133 (*)    Potassium 2.7 (*)    CO2 20 (*)    Glucose,  Bld 147 (*)    All other components within normal limits  SALICYLATE LEVEL - Abnormal; Notable for the following components:   Salicylate Lvl <9.6 (*)    All other components within normal limits  ACETAMINOPHEN LEVEL - Abnormal; Notable for the following components:   Acetaminophen (Tylenol), Serum <10 (*)    All other components within normal limits  URINE CULTURE  ETHANOL  MAGNESIUM  RAPID URINE DRUG SCREEN, HOSP PERFORMED  URINALYSIS, ROUTINE W REFLEX MICROSCOPIC    EKG EKG Interpretation  Date/Time:  Tuesday January 24 2022 21:50:36 EDT Ventricular Rate:  81 PR Interval:  56 QRS Duration: 88 QT Interval:  428 QTC Calculation: 497 R Axis:   47 Text Interpretation: Sinus rhythm Short PR interval Abnormal R-wave progression, early transition Consider left ventricular hypertrophy Nonspecific T abnormalities, inferior leads Borderline prolonged QT interval Confirmed by Wandra Arthurs [phone]) on 01/24/2022 11:29:29 PM  Radiology CT HEAD WO CONTRAST (5MM)  Result Date: 01/24/2022 CLINICAL DATA:  Altered mental status; fall EXAM: CT HEAD WITHOUT CONTRAST CT CERVICAL SPINE WITHOUT CONTRAST TECHNIQUE: Multidetector CT imaging of the head and cervical spine was performed following the standard protocol without intravenous contrast. Multiplanar CT image reconstructions of the cervical spine were also generated. RADIATION DOSE REDUCTION: This exam was performed according to the departmental dose-optimization program which includes automated exposure control, adjustment of the mA and/or kV according to patient size and/or use of iterative reconstruction technique. COMPARISON:  CT 01/05/2022 FINDINGS: CT HEAD FINDINGS Brain: No intracranial hemorrhage, mass effect, or evidence of acute infarct. No hydrocephalus. No extra-axial fluid collection. Generalized cerebral atrophy. Ill-defined hypoattenuation within the cerebral white matter is nonspecific but consistent with chronic small vessel ischemic  disease. Vascular: No hyperdense vessel. Intracranial arterial calcification. Skull: No fracture or focal lesion. Left frontal scalp laceration/contusion. Sinuses/Orbits: No acute finding. Paranasal sinuses and mastoid air cells are well aerated. Other: None. CT CERVICAL SPINE FINDINGS Alignment: No traumatic malalignment. Skull base and vertebrae: No acute fracture. No primary bone lesion or focal pathologic process. Soft tissues and spinal canal: No prevertebral fluid or swelling. No visible canal hematoma. Disc levels: Moderate multilevel spondylosis and disc space height loss greatest at C4-C7. Advanced cervical facet arthropathy at C3-C5 on the left. Posterior disc osteophyte complexes cause mild to moderate effacement of the ventral thecal sac. No high-grade spinal canal narrowing. Uncovertebral spurring and facet arthropathy cause multilevel advanced neural foraminal narrowing on the left at C3-C4, bilaterally at C4-C5, on the right at C5-C6, and bilaterally  at C6-C7. Upper chest: Negative. Other: None. IMPRESSION: 1. No acute intracranial abnormality. Generalized atrophy and small vessel white matter disease. 2. No acute fracture in the cervical spine. Multilevel degenerative spondylosis. 3. Left frontal scalp contusion/laceration.  No calvarial fracture. Electronically Signed   By: Placido Sou M.D.   On: 01/24/2022 21:07   CT Cervical Spine Wo Contrast  Result Date: 01/24/2022 CLINICAL DATA:  Altered mental status; fall EXAM: CT HEAD WITHOUT CONTRAST CT CERVICAL SPINE WITHOUT CONTRAST TECHNIQUE: Multidetector CT imaging of the head and cervical spine was performed following the standard protocol without intravenous contrast. Multiplanar CT image reconstructions of the cervical spine were also generated. RADIATION DOSE REDUCTION: This exam was performed according to the departmental dose-optimization program which includes automated exposure control, adjustment of the mA and/or kV according to  patient size and/or use of iterative reconstruction technique. COMPARISON:  CT 01/05/2022 FINDINGS: CT HEAD FINDINGS Brain: No intracranial hemorrhage, mass effect, or evidence of acute infarct. No hydrocephalus. No extra-axial fluid collection. Generalized cerebral atrophy. Ill-defined hypoattenuation within the cerebral white matter is nonspecific but consistent with chronic small vessel ischemic disease. Vascular: No hyperdense vessel. Intracranial arterial calcification. Skull: No fracture or focal lesion. Left frontal scalp laceration/contusion. Sinuses/Orbits: No acute finding. Paranasal sinuses and mastoid air cells are well aerated. Other: None. CT CERVICAL SPINE FINDINGS Alignment: No traumatic malalignment. Skull base and vertebrae: No acute fracture. No primary bone lesion or focal pathologic process. Soft tissues and spinal canal: No prevertebral fluid or swelling. No visible canal hematoma. Disc levels: Moderate multilevel spondylosis and disc space height loss greatest at C4-C7. Advanced cervical facet arthropathy at C3-C5 on the left. Posterior disc osteophyte complexes cause mild to moderate effacement of the ventral thecal sac. No high-grade spinal canal narrowing. Uncovertebral spurring and facet arthropathy cause multilevel advanced neural foraminal narrowing on the left at C3-C4, bilaterally at C4-C5, on the right at C5-C6, and bilaterally at C6-C7. Upper chest: Negative. Other: None. IMPRESSION: 1. No acute intracranial abnormality. Generalized atrophy and small vessel white matter disease. 2. No acute fracture in the cervical spine. Multilevel degenerative spondylosis. 3. Left frontal scalp contusion/laceration.  No calvarial fracture. Electronically Signed   By: Placido Sou M.D.   On: 01/24/2022 21:07    Procedures Procedures   Angiocath insertion Performed by: Wandra Arthurs  Consent: Verbal consent obtained. Risks and benefits: risks, benefits and alternatives were discussed Time  out: Immediately prior to procedure a "time out" was called to verify the correct patient, procedure, equipment, support staff and site/side marked as required.  Preparation: Patient was prepped and draped in the usual sterile fashion.  Vein Location: L antecube  Ultrasound Guided  Gauge: 20   Normal blood return and flush without difficulty Patient tolerance: Patient tolerated the procedure well with no immediate complications.  Angiocath insertion Performed by: Wandra Arthurs  Consent: Verbal consent obtained. Risks and benefits: risks, benefits and alternatives were discussed Time out: Immediately prior to procedure a "time out" was called to verify the correct patient, procedure, equipment, support staff and site/side marked as required.  Preparation: Patient was prepped and draped in the usual sterile fashion.  Vein Location: L wrist   Gauge: 22   Normal blood return and flush without difficulty Patient tolerance: Patient tolerated the procedure well with no immediate complications.      Medications Ordered in ED Medications  potassium chloride 10 mEq in 100 mL IVPB (10 mEq Intravenous New Bag/Given 01/24/22 2147)  LORazepam (ATIVAN) injection 1  mg (has no administration in time range)  morphine (PF) 4 MG/ML injection 4 mg (has no administration in time range)  acidophilus (RISAQUAD) capsule 2 capsule (has no administration in time range)  pantoprazole (PROTONIX) EC tablet 40 mg (has no administration in time range)  sertraline (ZOLOFT) tablet 100 mg (has no administration in time range)  LORazepam (ATIVAN) tablet 1 mg (has no administration in time range)  acetaminophen (TYLENOL) tablet 500 mg (has no administration in time range)  lidocaine (XYLOCAINE) 2 % (with pres) injection 200 mg (200 mg Infiltration Given 01/24/22 2125)  potassium chloride SA (KLOR-CON M) CR tablet 40 mEq (40 mEq Oral Given 01/24/22 2144)  potassium chloride 10 MEQ/100ML IVPB (0 mEq  Stopped  01/24/22 2229)  potassium chloride SA (KLOR-CON M) 20 MEQ CR tablet (  Given 01/24/22 2229)    ED Course/ Medical Decision Making/ A&P                           Medical Decision Making [name] is a 66 y.o. female here presenting with left eyebrow laceration as well as head injury and wandering in traffic.  Patient has not been able to take care of herself and she is very tearful.  She had recent intracranial bleeding so we will get CT head to rule out a bleed.  We will get psych clearance labs and likely will need psych consult.  11:27 PM I reviewed patient's labs and independently interpreted imaging studies.  Patient's potassium is 2.7 I ordered several potassium runs and as well as oral supplement.  Patient is supposed to take potassium but has not been taking it.  CT head and cervical spine unremarkable.  Laceration was sutured by the PA under my supervision. Patient is under IVC and TTS will see her.   Problems Addressed: Depression, unspecified depression type: acute illness or injury Facial laceration, initial encounter: acute illness or injury  Amount and/or Complexity of Data Reviewed Labs: ordered. Decision-making details documented in ED Course. Radiology: ordered and independent interpretation performed. Decision-making details documented in ED Course. ECG/medicine tests: ordered.  Risk OTC drugs. Prescription drug management.    Final Clinical Impression(s) / ED Diagnoses Final diagnoses:  Depression, unspecified depression type  Facial laceration, initial encounter    Rx / DC Orders ED Discharge Orders     None         Drenda Freeze, MD 01/24/22 2328    Drenda Freeze, MD 01/24/22 [phone]

## 2022-01-24 NOTE — ED Provider Notes (Signed)
..  Laceration Repair  Date/Time: 01/24/2022 9:44 PM  Performed by: Mickie Hillier, PA-C Authorized by: Mickie Hillier, PA-C   Consent:    Consent obtained:  Verbal   Consent given by:  Patient   Risks, benefits, and alternatives were discussed: yes     Risks discussed:  Infection, pain, poor cosmetic result, need for additional repair and poor wound healing   Alternatives discussed:  No treatment and delayed treatment Universal protocol:    Procedure explained and questions answered to patient or proxy's satisfaction: yes     Relevant documents present and verified: yes     Test results available: yes     Imaging studies available: yes     Required blood products, implants, devices, and special equipment available: yes     Site/side marked: yes     Immediately prior to procedure, a time out was called: yes     Patient identity confirmed:  Verbally with patient Anesthesia:    Anesthesia method:  Local infiltration   Local anesthetic:  Lidocaine 2% w/o epi Laceration details:    Location:  Scalp   Scalp location:  Frontal   Length (cm):  4   Depth (mm):  4 Pre-procedure details:    Preparation:  Patient was prepped and draped in usual sterile fashion Exploration:    Limited defect created (wound extended): no     Hemostasis achieved with:  Direct pressure   Wound exploration: wound explored through full range of motion and entire depth of wound visualized     Wound extent: areolar tissue violated     Contaminated: no   Treatment:    Area cleansed with:  Saline and povidone-iodine   Amount of cleaning:  Standard   Irrigation solution:  Sterile saline   Irrigation method:  Pressure wash   Debridement:  None Skin repair:    Repair method:  Sutures   Suture size:  4-0   Suture material:  Prolene   Suture technique:  Simple interrupted   Number of sutures:  5 Approximation:    Approximation:  Close Repair type:    Repair type:  Simple Post-procedure details:     Dressing:  Open (no dressing)   Procedure completion:  Tolerated well, no immediate complications     Mickie Hillier, PA-C 01/24/22 2145    Drenda Freeze, MD 01/25/22 1500

## 2022-01-25 DIAGNOSIS — F251 Schizoaffective disorder, depressive type: Secondary | ICD-10-CM

## 2022-01-25 DIAGNOSIS — S0101XA Laceration without foreign body of scalp, initial encounter: Secondary | ICD-10-CM | POA: Diagnosis not present

## 2022-01-25 LAB — BASIC METABOLIC PANEL
Anion gap: 7 (ref 5–15)
BUN: 7 mg/dL — ABNORMAL LOW (ref 8–23)
CO2: 20 mmol/L — ABNORMAL LOW (ref 22–32)
Calcium: 8.5 mg/dL — ABNORMAL LOW (ref 8.9–10.3)
Chloride: 110 mmol/L (ref 98–111)
Creatinine, Ser: 0.55 mg/dL (ref 0.44–1.00)
GFR, Estimated: >60 mL/min (ref >60)
Glucose, Bld: 102 mg/dL — ABNORMAL HIGH (ref 70–99)
Potassium: 3.3 mmol/L — ABNORMAL LOW (ref 3.5–5.1)
Sodium: 137 mmol/L (ref 135–145)

## 2022-01-25 LAB — URINALYSIS, ROUTINE W REFLEX MICROSCOPIC
Bacteria, UA: NONE SEEN
Bilirubin Urine: NEGATIVE
Glucose, UA: NEGATIVE mg/dL
Hgb urine dipstick: NEGATIVE
Ketones, ur: NEGATIVE mg/dL
Nitrite: NEGATIVE
Protein, ur: NEGATIVE mg/dL
Specific Gravity, Urine: 1.005 (ref 1.005–1.030)
pH: 6 (ref 5.0–8.0)

## 2022-01-25 LAB — RAPID URINE DRUG SCREEN, HOSP PERFORMED
Amphetamines: NOT DETECTED
Barbiturates: NOT DETECTED
Benzodiazepines: NOT DETECTED
Cocaine: NOT DETECTED
Opiates: POSITIVE — AB
Tetrahydrocannabinol: NOT DETECTED

## 2022-01-25 MED ORDER — POTASSIUM CHLORIDE CRYS ER 20 MEQ PO TBCR
30.0000 meq | EXTENDED_RELEASE_TABLET | Freq: Once | ORAL | Status: AC
  Administered 2022-01-25: 30 meq via ORAL
  Filled 2022-01-25: qty 1

## 2022-01-25 MED ORDER — MAGNESIUM SULFATE IN D5W 1-5 GM/100ML-% IV SOLN
1.0000 g | Freq: Once | INTRAVENOUS | Status: AC
  Administered 2022-01-25: 1 g via INTRAVENOUS
  Filled 2022-01-25: qty 100

## 2022-01-25 NOTE — Progress Notes (Signed)
[place] Placement  Pt meets inpatient criteria per Lindon Romp, NP. There are no available beds at [facility] per Dr. Weber Cooks.  Referral was sent to the following facilities;   Destination Service Provider Address Phone [facility]  [address], [place] [address] [place]  [facility]  [address] [place] [address] [phone] [facility]  [place], [place] [address] [phone]  [facility]  [place] [address] [place] [address] [place]  [facility]  [address], [place] [address] [phone]  [place] [address] [place] [address] [phone]  [facility]  [address], [place] [address] [phone] [place]  [address], [place] [address] [phone] [place]  [address] [place] [address] [phone] [facility]  [address], [place] [address] [phone] [place], [place] [address] [phone]    Situation ongoing,  CSW will follow up.   Benjaman Kindler, MSW, LCSWA 01/25/2022  @ 3:02 PM

## 2022-01-25 NOTE — Consult Note (Signed)
[facility] ED ASSESSMENT   Reason for Consult:  AMS Referring Physician:  Dr. Darl Householder Patient Identification: [name] MRN:  [id number] ED Chief Complaint: Schizoaffective disorder, depressive type [facility])  Diagnosis:  Principal Problem:   Schizoaffective disorder, depressive type ([place]) Active Problems:   PTSD (post-traumatic stress disorder)   ED Assessment Time Calculation: Start Time: 1025 Stop Time: 1050 Total Time in Minutes (Assessment Completion): 25   Subjective:   [name] is a 66 y.o. female patient with a history of schizoaffective disorder and recent intracranial hemorrhage who presented to [facility] on 01/24/22 with a head laceration. Per notes, the patient was found walking in traffic and made suicidal statements to family. Patient was medically admitted 01/05/22 after a similar incident in which the patient was found with a head injury and walking in traffic.  Reportedly made suicidal statements to family.   HPI:   Patient states that she does not recall walking in traffic yesterday.  She states that she does recall presenting to the emergency department with a head injury.  She denies suicidal ideations.  She denied auditory and visual hallucinations.  She reports when she was discharged from the hospital with her recent head injury she went to a skilled nursing facility.  States that she was at the skilled nursing facility for approximately 2 weeks.  States that she lives in [place] alone.  States that she has a son who lives in [place] and a daughter who lives in [place].  States that she has neighbors and friends in [place] who help watch after her.  States that she is currently prescribed sertraline and Abilify.  States that she has been taking medications as prescribed.  On evaluation, the patient is alert and oriented x 4.  She is pleasant and cooperative.  Eye contact is good.  Speech is clear and coherent.  Mood is depressed and anxious.  Affect is congruent with  mood.  Thought process: Coherent.  Thought content is logical.  She denies auditory and visual hallucinations.  No indication that she is responding to internal stimuli.  No delusions elicited during this assessment.  She denies suicidal ideations.  She denies homicidal ideations.  She denies substance abuse.   Past Psychiatric History: Schizoaffective disorder  Risk to Self or Others: Is the patient at risk to self? Yes Has the patient been a risk to self in the past 6 months? No Has the patient been a risk to self within the distant past? Yes Is the patient a risk to others? No Has the patient been a risk to others in the past 6 months? No Has the patient been a risk to others within the distant past? No  [place] Scale:  [place] ED from 01/24/2022 in [place] DEPT ED to Hosp-Admission (Discharged) from 01/05/2022 in [place] ED to Hosp-Admission (Discharged) from 10/16/2021 in [place] [facility] EAST MEDICAL UNIT  C-SSRS RISK CATEGORY High Risk No Risk No Risk       AIMS:  , , ,  ,   ASAM:    Substance Abuse:     Past Medical History:  Past Medical History:  Diagnosis Date   DDD (degenerative disc disease), lumbar    Dementia ([place])    Depression    Diverticulosis    Dizziness    GERD (gastroesophageal reflux disease)    IBS (irritable bowel syndrome)    Internal hemorrhoid    2nd degree   Myocarditis (  [place]) 2009   Osteoarthritis    Prolapsed internal hemorrhoids, grade 3 12/01/2015   Recurrent falls    Schizoaffective disorder ([place])    Tubular adenoma of colon 08/2011    Past Surgical History:  Procedure Laterality Date   BIOPSY  10/19/2021   Procedure: BIOPSY;  Surgeon: Clarene Essex, MD;  Location: [facility];  Service: Gastroenterology;;   BREAST BIOPSY Right 12/27/2012   CHOLECYSTECTOMY     ESOPHAGOGASTRODUODENOSCOPY (EGD) WITH PROPOFOL N/A 10/19/2021   Procedure: ESOPHAGOGASTRODUODENOSCOPY  (EGD) WITH PROPOFOL;  Surgeon: Clarene Essex, MD;  Location: [facility];  Service: Gastroenterology;  Laterality: N/A;   FOREIGN BODY REMOVAL  10/19/2021   Procedure: FOREIGN BODY REMOVAL;  Surgeon: Clarene Essex, MD;  Location: [facility];  Service: Gastroenterology;;   HEMORRHOID BANDING     INTRAMEDULLARY (IM) NAIL INTERTROCHANTERIC Left 01/24/2020   Procedure: INTRAMEDULLARY (IM) NAIL INTERTROCHANTRIC;  Surgeon: Leandrew Koyanagi, MD;  Location: [facility];  Service: Orthopedics;  Laterality: Left;   Family History:  Family History  Problem Relation Age of Onset   Suicidality Father    Depression Father    Suicidality Sister    Depression Sister    Suicidality Cousin    Suicidality Other     Social History:  Social History   Substance and Sexual Activity  Alcohol Use No   Alcohol/week: 0.0 standard drinks of alcohol     Social History   Substance and Sexual Activity  Drug Use No    Social History   Socioeconomic History   Marital status: Divorced    Spouse name: Not on file   Number of children: 2   Years of education: Not on file   Highest education level: Not on file  Occupational History   Occupation: Disabled  Tobacco Use   Smoking status: Every Day    Packs/day: 0.50    Years: 38.00    Total pack years: 19.00    Types: Cigarettes   Smokeless tobacco: Never  Vaping Use   Vaping Use: Never used  Substance and Sexual Activity   Alcohol use: No    Alcohol/week: 0.0 standard drinks of alcohol   Drug use: No   Sexual activity: Not Currently  Other Topics Concern   Not on file  Social History Narrative   Not on file   Social Determinants of Health   Financial Resource Strain: Not on file  Food Insecurity: Not on file  Transportation Needs: Not on file  Physical Activity: Not on file  Stress: Not on file  Social Connections: Not on file   Additional Social History:    Allergies:   Allergies  Allergen Reactions   Penicillins Rash          Sulfa  Antibiotics Rash    Labs:  Results for orders placed or performed during the hospital encounter of 01/24/22 (from the past 48 hour(s))  CBC with Differential     Status: Abnormal   Collection Time: 01/24/22  8:20 PM  Result Value Ref Range   WBC 7.5 4.0 - 10.5 K/uL   RBC 3.60 (L) 3.87 - 5.11 MIL/uL   Hemoglobin 10.5 (L) 12.0 - 15.0 g/dL   HCT 30.3 (L) 36.0 - 46.0 %   MCV 84.2 80.0 - 100.0 fL   MCH 29.2 26.0 - 34.0 pg   MCHC 34.7 30.0 - 36.0 g/dL   RDW 14.6 11.5 - 15.5 %   Platelets 201 150 - 400 K/uL   nRBC 0.0 0.0 - 0.2 %  Neutrophils Relative % 79 %   Neutro Abs 6.0 1.7 - 7.7 K/uL   Lymphocytes Relative 14 %   Lymphs Abs 1.0 0.7 - 4.0 K/uL   Monocytes Relative 6 %   Monocytes Absolute 0.4 0.1 - 1.0 K/uL   Eosinophils Relative 0 %   Eosinophils Absolute 0.0 0.0 - 0.5 K/uL   Basophils Relative 1 %   Basophils Absolute 0.0 0.0 - 0.1 K/uL   Immature Granulocytes 0 %   Abs Immature Granulocytes 0.02 0.00 - 0.07 K/uL    Comment: Performed at [facility], [place] [address], [place], [place] [address]  Comprehensive metabolic panel     Status: Abnormal   Collection Time: 01/24/22  8:20 PM  Result Value Ref Range   Sodium 133 (L) 135 - 145 mmol/L   Potassium 2.7 (LL) 3.5 - 5.1 mmol/L    Comment: CRITICAL RESULT CALLED TO, READ BACK BY AND VERIFIED WITH REMANDO,J RN AT 2106 ON 01/24/22 BY [email or web address]    Chloride 105 98 - 111 mmol/L   CO2 20 (L) 22 - 32 mmol/L   Glucose, Bld 147 (H) 70 - 99 mg/dL    Comment: Glucose reference range applies only to samples taken after fasting for at least 8 hours.   BUN 10 8 - 23 mg/dL   Creatinine, Ser 0.60 0.44 - 1.00 mg/dL   Calcium 9.1 8.9 - 10.3 mg/dL   Total Protein 6.8 6.5 - 8.1 g/dL   Albumin 3.5 3.5 - 5.0 g/dL   AST 24 15 - 41 U/L   ALT 15 0 - 44 U/L   Alkaline Phosphatase 65 38 - 126 U/L   Total Bilirubin 1.1 0.3 - 1.2 mg/dL   GFR, Estimated >60 >60 mL/min    Comment: (NOTE) Calculated using the CKD-EPI Creatinine  Equation (2021)    Anion gap 8 5 - 15    Comment: Performed at [facility], [place] [address] [place], [place] [address]  Ethanol     Status: None   Collection Time: 01/24/22  8:20 PM  Result Value Ref Range   Alcohol, Ethyl (B) <10 <10 mg/dL    Comment: (NOTE) Lowest detectable limit for serum alcohol is 10 mg/dL.  For medical purposes only. Performed at [facility], [place] [address] [place], [place] [address]   Salicylate level     Status: Abnormal   Collection Time: 01/24/22  8:20 PM  Result Value Ref Range   Salicylate Lvl <5.6 (L) 7.0 - 30.0 mg/dL    Comment: Performed at [facility], [place] [address], [place], [place] [address]  Acetaminophen level     Status: Abnormal   Collection Time: 01/24/22  8:20 PM  Result Value Ref Range   Acetaminophen (Tylenol), Serum <10 (L) 10 - 30 ug/mL    Comment: (NOTE) Therapeutic concentrations vary significantly. A range of 10-30 ug/mL  may be an effective concentration for many patients. However, some  are best treated at concentrations outside of this range. Acetaminophen concentrations >150 ug/mL at 4 hours after ingestion  and >50 ug/mL at 12 hours after ingestion are often associated with  toxic reactions.  Performed at [facility], [place] [address] [place], [place] [address]   Magnesium     Status: None   Collection Time: 01/24/22  8:37 PM  Result Value Ref Range   Magnesium 1.7 1.7 - 2.4 mg/dL    Comment: Performed at [facility], [address] [place], [place] [address]  Rapid urine drug screen (hospital performed)     Status: Abnormal   Collection Time: 01/25/22  4:33 AM  Result Value Ref Range   Opiates POSITIVE (A) NONE DETECTED   Cocaine NONE DETECTED NONE DETECTED   Benzodiazepines NONE DETECTED NONE DETECTED   Amphetamines NONE DETECTED NONE DETECTED   Tetrahydrocannabinol NONE DETECTED NONE DETECTED   Barbiturates NONE  DETECTED NONE DETECTED    Comment: (NOTE) DRUG SCREEN FOR MEDICAL PURPOSES ONLY.  IF CONFIRMATION IS NEEDED FOR ANY PURPOSE, NOTIFY LAB WITHIN 5 DAYS.  LOWEST DETECTABLE LIMITS FOR URINE DRUG SCREEN Drug Class                     Cutoff (ng/mL) Amphetamine and metabolites    1000 Barbiturate and metabolites    200 Benzodiazepine                 200 Opiates and metabolites        300 Cocaine and metabolites        300 THC                            50 Performed at [facility], [place] [address] [place], [place] [address]   Urinalysis, Routine w reflex microscopic     Status: Abnormal   Collection Time: 01/25/22  4:33 AM  Result Value Ref Range   Color, Urine YELLOW YELLOW   APPearance CLEAR CLEAR   Specific Gravity, Urine 1.005 1.005 - 1.030   pH 6.0 5.0 - 8.0   Glucose, UA NEGATIVE NEGATIVE mg/dL   Hgb urine dipstick NEGATIVE NEGATIVE   Bilirubin Urine NEGATIVE NEGATIVE   Ketones, ur NEGATIVE NEGATIVE mg/dL   Protein, ur NEGATIVE NEGATIVE mg/dL   Nitrite NEGATIVE NEGATIVE   Leukocytes,Ua SMALL (A) NEGATIVE   RBC / HPF 0-5 0 - 5 RBC/hpf   WBC, UA 0-5 0 - 5 WBC/hpf   Bacteria, UA NONE SEEN NONE SEEN   Squamous Epithelial / LPF 0-5 0 - 5    Comment: Performed at [facility], [place] [address], [place], [place] [address]  Basic metabolic panel     Status: Abnormal   Collection Time: 01/25/22  4:46 AM  Result Value Ref Range   Sodium 137 135 - 145 mmol/L   Potassium 3.3 (L) 3.5 - 5.1 mmol/L   Chloride 110 98 - 111 mmol/L   CO2 20 (L) 22 - 32 mmol/L   Glucose, Bld 102 (H) 70 - 99 mg/dL    Comment: Glucose reference range applies only to samples taken after fasting for at least 8 hours.   BUN 7 (L) 8 - 23 mg/dL   Creatinine, Ser 0.55 0.44 - 1.00 mg/dL   Calcium 8.5 (L) 8.9 - 10.3 mg/dL   GFR, Estimated >60 >60 mL/min    Comment: (NOTE) Calculated using the CKD-EPI Creatinine Equation (2021)    Anion gap 7 5 - 15    Comment: Performed  at [facility], [place] [address], [place], [place] [address]    Current Facility-Administered Medications  Medication Dose Route Frequency Provider Last Rate Last Admin   acetaminophen (TYLENOL) tablet 500 mg  500 mg Oral Q6H PRN Drenda Freeze, MD   500 mg at 01/25/22 0948   acidophilus (RISAQUAD) capsule 2 capsule  2 capsule Oral Daily Drenda Freeze, MD       LORazepam Francee Gentile)  tablet 1 mg  1 mg Oral Q4H PRN Drenda Freeze, MD   1 mg at 01/25/22 0948   pantoprazole (PROTONIX) EC tablet 40 mg  40 mg Oral Daily Drenda Freeze, MD   40 mg at 01/25/22 0948   potassium chloride SA (KLOR-CON M) CR tablet 40 mEq  40 mEq Oral Daily Drenda Freeze, MD   40 mEq at 01/25/22 0948   sertraline (ZOLOFT) tablet 100 mg  100 mg Oral BID Drenda Freeze, MD   100 mg at 01/25/22 1914   Current Outpatient Medications  Medication Sig Dispense Refill   ARIPiprazole (ABILIFY) 5 MG tablet Take 1 tablet (5 mg total) by mouth daily.     lacosamide 150 MG TABS Take 1 tablet (150 mg total) by mouth 2 (two) times daily.     omeprazole (PRILOSEC) 40 MG capsule Take 40 mg by mouth in the morning and at bedtime.     sertraline (ZOLOFT) 100 MG tablet Take 1 tablet (100 mg total) by mouth 2 (two) times daily. 180 tablet 0   acidophilus (RISAQUAD) CAPS capsule Take 2 capsules by mouth daily. (Patient taking differently: Take 1 capsule by mouth in the morning and at bedtime.) 60 capsule 0   Lactobacillus (ACIDOPHILUS PROBIOTIC) TABS Take by mouth.     meclizine (ANTIVERT) 25 MG tablet Take 25 mg by mouth daily as needed for dizziness.     ondansetron (ZOFRAN-ODT) 4 MG disintegrating tablet Take 4 mg by mouth every 8 (eight) hours as needed for nausea or vomiting.     PREMARIN vaginal cream Place 1 Application vaginally daily.      Musculoskeletal: Strength & Muscle Tone: within normal limits Gait & Station: normal Patient leans: N/A   Psychiatric Specialty Exam: Presentation   General Appearance:  Casual; Fairly Groomed  Eye Contact: Good  Speech: Clear and Coherent; Normal Rate  Speech Volume: Normal  Handedness: Right   Mood and Affect  Mood: Euthymic  Affect: Congruent; Appropriate   Thought Process  Thought Processes: Coherent  Descriptions of Associations:Intact  Orientation:Full (Time, Place and Person)  Thought Content:Logical  History of Schizophrenia/Schizoaffective disorder:No data recorded Duration of Psychotic Symptoms:No data recorded Hallucinations:Hallucinations: None  Ideas of Reference:None  Suicidal Thoughts:Suicidal Thoughts: No  Homicidal Thoughts:Homicidal Thoughts: No   Sensorium  Memory: Immediate Good; Recent Good; Remote Good  Judgment: Intact  Insight: Lacking   Executive Functions  Concentration: Good  Attention Span: Good  Recall: Good  Fund of Knowledge: Good  Language: Good   Psychomotor Activity  Psychomotor Activity: Psychomotor Activity: Normal   Assets  Assets: Communication Skills; Desire for Improvement; Housing    Sleep  Sleep: Sleep: Fair   Physical Exam: Physical Exam HENT:     Right Ear: External ear normal.     Left Ear: External ear normal.  Eyes:     General:        Right eye: No discharge.        Left eye: No discharge.  Cardiovascular:     Rate and Rhythm: Normal rate.  Pulmonary:     Effort: Pulmonary effort is normal. No respiratory distress.  Musculoskeletal:        General: Normal range of motion.     Cervical back: Normal range of motion.  Neurological:     Mental Status: She is alert and oriented to person, place, and time.  Psychiatric:        Mood and Affect: Mood is anxious and depressed.  Behavior: Behavior is cooperative.        Thought Content: Thought content is not paranoid or delusional. Thought content does not include homicidal or suicidal ideation.    Review of Systems  Respiratory:  Negative for cough and  shortness of breath.   Cardiovascular:  Negative for chest pain.  Gastrointestinal:  Negative for diarrhea, nausea and vomiting.  Psychiatric/Behavioral:  Positive for depression. Negative for hallucinations, substance abuse and suicidal ideas. The patient is nervous/anxious and has insomnia.    Blood pressure (!) 116/57, pulse 60, temperature 98.6 F (37 C), temperature source Oral, resp. rate 17, SpO2 95 %. There is no height or weight on file to calculate BMI.  Medical Decision Making: [name] is a 66 y.o. female patient with a history of schizoaffective disorder and recent intracranial hemorrhage who presented to [facility] on 01/24/22 with a head laceration. Per notes, the patient was found walking in traffic and made suicidal statements to family. Patient was medically admitted 01/05/22 after a similar incident in which the patient was found with a head injury and walking in traffic.    It is concerning that the patient has been found twice with head injuries and walking in traffic over the past few weeks.  We will recommend inpatient geropsychiatric treatment.  Continue sertraline 100 mg twice daily for depression  QTc 497, will not continue Abilify at this time.   Disposition: Recommend psychiatric Inpatient admission when medically cleared.  Joyce Nunnery, NP 01/25/2022 5:24 PM

## 2022-01-25 NOTE — ED Provider Notes (Signed)
Emergency Medicine Observation Re-evaluation Note  [name] is a 66 y.o. female, seen on rounds today.  Pt initially presented to the ED for complaints of Head Laceration and Suicide Attempt Currently, the patient is sleeping.  Physical Exam  BP (!) 160/82   Pulse 88   Temp 98.6 F (37 C) (Oral)   Resp 20   SpO2 98%  Physical Exam General: Calm Cardiac: No distress Lungs: No distress Psych: Sleeping  ED Course / MDM  EKG:EKG Interpretation  Date/Time:  Tuesday January 24 2022 21:50:36 EDT Ventricular Rate:  81 PR Interval:  56 QRS Duration: 88 QT Interval:  428 QTC Calculation: 497 R Axis:   47 Text Interpretation: Sinus rhythm Short PR interval Abnormal R-wave progression, early transition Consider left ventricular hypertrophy Nonspecific T abnormalities, inferior leads Borderline prolonged QT interval Confirmed by Wandra Arthurs [phone]) on 01/24/2022 11:29:29 PM  I have reviewed the labs performed to date as well as medications administered while in observation.  Recent changes in the last 24 hours include IVC for self-destructive behavior..  Plan  Current plan is for TTS evaluation.    Ezequiel Essex, MD 01/25/22 [phone]

## 2022-01-25 NOTE — Progress Notes (Addendum)
UPDATE----Pt denied at [facility] due to head wound sutures.    Pt was accepted to [facility] 01/25/22  Pt meets inpatient criteria per Lindon Romp, NP  Attending Physician will be Dr. Julaine Fusi  Report can be called to: [phone]  Pt can arrive before: 9:30pm  Care Team notified: Lindon Romp, NP and Wilmon Arms, [place], [place] 01/25/2022 @ 3:32 PM

## 2022-01-25 NOTE — BH Assessment (Signed)
Per [place] Constable. Jodi Mourning, RN the pt is unable to keep her eyes open for longer than two minutes after receiving Ativan for anxiety and agitation. Clinician reports, the pt to be assessed during day shift, when pt is able to engage. RN express understanding.    Vertell Novak, [place], [facility], [facility] Triage Specialist [phone]

## 2022-01-25 NOTE — ED Notes (Addendum)
TTS counselor at bedtime.

## 2022-01-25 NOTE — ED Notes (Signed)
Pt was advised she is only allowed three 15 minute calls per day. Pt has already made one call for the day.

## 2022-01-25 NOTE — ED Notes (Addendum)
Pt made her second phone call for the day

## 2022-01-25 NOTE — BH Assessment (Signed)
Clinician messaged Jazmin M. Jodi Mourning, RN: "Hey. It's Trey with TTS. Is the pt able to engage in the assessment, if so the pt will need to be placed in a private room. Is the pt under IVC? Also is the pt medically cleared?   Clinician awaiting response.    Vertell Novak, [place], [facility], [facility] Triage Specialist [phone]

## 2022-01-25 NOTE — Progress Notes (Signed)
Patient has been denied by [facility] due to no appropriate beds available. Patient meets [place] inpatient criteria per Lindon Romp, NP. Patient has been faxed out to the following facilities:   [facility]  [address] [place] [address] [place]  [facility]  [address] [place] [address] [phone]  [facility]  [place], [place] [address] [phone]  [facility]  [address] [address] [place] [address] [place]  [facility]  [address], [place] [address] [phone]  [place] [address] [place] [address] [phone]  [facility]  [address], [place] [address] [phone] [place]  [address], [place] [address] [phone] [place]  [address] [place] [address] [phone] [facility]  [address], [place] [address] [phone] [place], [place] [address] [place]   Mariea Clonts, MSW, LCSW-A  9:32 PM 01/25/2022

## 2022-01-26 DIAGNOSIS — S0101XA Laceration without foreign body of scalp, initial encounter: Secondary | ICD-10-CM | POA: Diagnosis not present

## 2022-01-26 LAB — URINE CULTURE

## 2022-01-26 LAB — SARS CORONAVIRUS 2 BY RT PCR: SARS Coronavirus 2 by RT PCR: NEGATIVE

## 2022-01-26 MED ORDER — NICOTINE POLACRILEX 2 MG MT GUM
2.0000 mg | CHEWING_GUM | OROMUCOSAL | Status: DC | PRN
  Administered 2022-01-26: 2 mg via ORAL
  Filled 2022-01-26: qty 1

## 2022-01-26 MED ORDER — NICOTINE 14 MG/24HR TD PT24: 14.0000 mg | MEDICATED_PATCH | Freq: Every day | TRANSDERMAL | Status: DC

## 2022-01-26 NOTE — Progress Notes (Signed)
Pt was accepted to [facility] 01/26/22; Bed Assignment [place] [facility] PENDING IVC Paperwork to sent via fax [phone]  Pt meets inpatient criteria per Lindon Romp, NP  Attending Physician will be Dr. Lucilla Edin  Report can be called to: [phone]  Pt can arrive after: IVC paperwork is received  Care Team notified: Lindon Romp, NP. Laurena Spies, RN, Donne Hazel, [name], NT, Alethia Berthold, MD  Nadara Mode, [place] 01/26/2022 @ 8:17 AM

## 2022-01-26 NOTE — ED Notes (Signed)
Pt has been restless overnight. Pt slept for small periods and would get up and use the bathroom. Pt has been up multiple times starting at 0600 asking for a cigarette.

## 2022-01-26 NOTE — Discharge Instructions (Addendum)
SUTURES NEED TO BE REMOVED IN 5-7 DAYS

## 2022-01-26 NOTE — ED Provider Notes (Signed)
Emergency Medicine Observation Re-evaluation Note  [name] is a 66 y.o. female, seen on rounds today.  Pt initially presented to the ED for complaints of Head Laceration and Suicide Attempt Currently, the patient is sitting in bed.  Physical Exam  BP 139/67 (BP Location: Right Arm)   Pulse (!) 53   Temp 98.2 F (36.8 C) (Oral)   Resp 16   SpO2 92%  Physical Exam General: Sitting up in bed, no distress Cardiac: Normal rate Lungs: No increased work of breathing Psych: Calm and cooperative Sutures in place over left eyebrow.  Intact without redness or drainage.  ED Course / MDM  EKG:EKG Interpretation  Date/Time:  Tuesday January 24 2022 21:50:36 EDT Ventricular Rate:  81 PR Interval:  56 QRS Duration: 88 QT Interval:  428 QTC Calculation: 497 R Axis:   47 Text Interpretation: Sinus rhythm Short PR interval Abnormal R-wave progression, early transition Consider left ventricular hypertrophy Nonspecific T abnormalities, inferior leads Borderline prolonged QT interval Confirmed by Wandra Arthurs [phone]) on 01/24/2022 11:29:29 PM  I have reviewed the labs performed to date as well as medications administered while in observation.  Recent changes in the last 24 hours include none.  Plan  Current plan is for awaiting placement.  Patient had sutures placed on October 17.  Will ultimately need removal.    Malvin Johns, MD 01/26/22 [phone]

## 2022-02-02 ENCOUNTER — Telehealth (HOSPITAL_COMMUNITY): Payer: Self-pay | Admitting: Psychiatry

## 2022-02-02 ENCOUNTER — Telehealth (HOSPITAL_COMMUNITY): Payer: 59 | Admitting: Psychiatry

## 2022-02-02 ENCOUNTER — Encounter (HOSPITAL_COMMUNITY): Payer: Self-pay

## 2022-02-02 NOTE — Telephone Encounter (Signed)
Patient was not present on video platform used through mychart. I called the patient at our scheduled appointment time. There was no answer. I left a voice message for patient to call the clinic back at their convinence. There was no return phone call during out scheduled visit time. I was not able to speak with the patient today, as they were a no show for their scheduled appointment.   

## 2022-03-06 ENCOUNTER — Encounter: Payer: Self-pay | Admitting: Psychiatry

## 2022-03-06 ENCOUNTER — Ambulatory Visit: Payer: Medicare Other | Admitting: Psychiatry

## 2022-03-06 NOTE — Progress Notes (Deleted)
   CC:  imbalance  Follow-up Visit  Last visit: 08/29/21  Brief HPI: 66 year old female with a history of HTN, schizoaffective disorder, PTSD who follows in clinic for dizziness and imbalance. MRI brain 07/20/21 without acute process.  At her last visit, MRI L-spine was ordered. Neuropathy labs were ordered. Referral to PT was placed for gait and balance.  Interval History: *** MRI L-spine was not done. Myeloma panel showed a polyclonal gammopathy. Autoimmune labs and HIV were ordered but not done.  She was recently hospitalized in September for loss of consciousness and fall with traumatic ICH. She was monitored on EEG with no seizures recorded. She was started empirically on Vimpat 150 mg BID.  Physical Exam:   Vital Signs: There were no vitals taken for this visit. GENERAL:  well appearing, in no acute distress, alert  SKIN:  Color, texture, turgor normal. No rashes or lesions HEAD:  Normocephalic/atraumatic. RESP: normal respiratory effort MSK:  No gross joint deformities.   NEUROLOGICAL: Mental Status: Alert, oriented to person, place and time, Follows commands, and Speech fluent and appropriate. Cranial Nerves: PERRL, face symmetric, no dysarthria, hearing grossly intact Motor: moves all extremities equally Gait: normal-based.  IMPRESSION: ***  PLAN: *** -Repeat myeloma panel  Follow-up: ***  I spent a total of *** minutes on the date of the service. Discussed medication side effects, adverse reactions and drug interactions. Written educational materials and patient instructions outlining all of the above were given.  Genia Harold, MD

## 2022-03-10 DEATH — deceased

## 2022-11-29 IMAGING — CT CT HEAD W/O CM
4 series · 16 of 47 positions shown, 18 images · non-contrast
Comparison: Head CT dated 07/20/2021.

CLINICAL DATA: Head trauma.



[Series 2: head wo · axial · 0.40mm/px · z∈[-576,-461]mm · 7 of 31 slices shown, 9 images]
[im 4/31  brain]
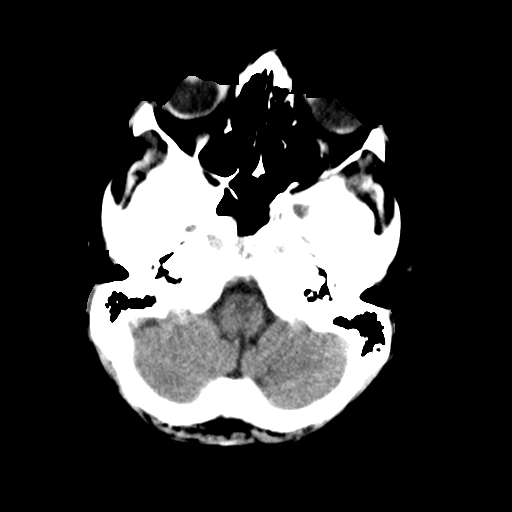
[im 4/31  bone]
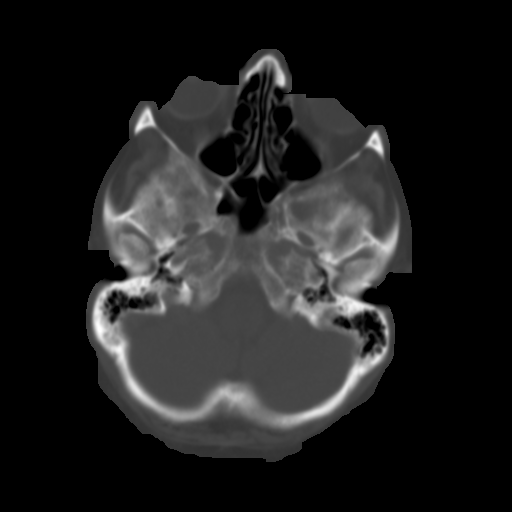
[im 8/31  brain]
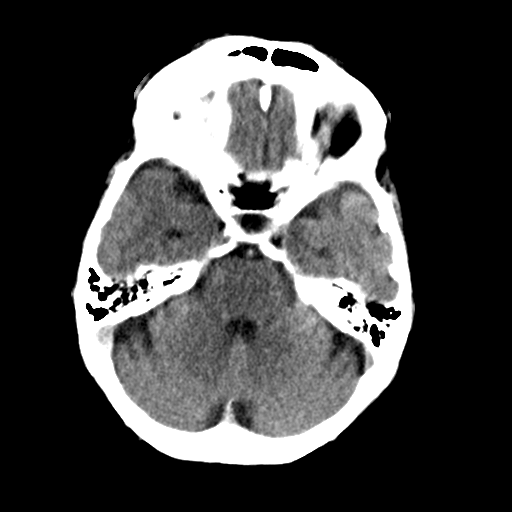
[im 12/31  brain]
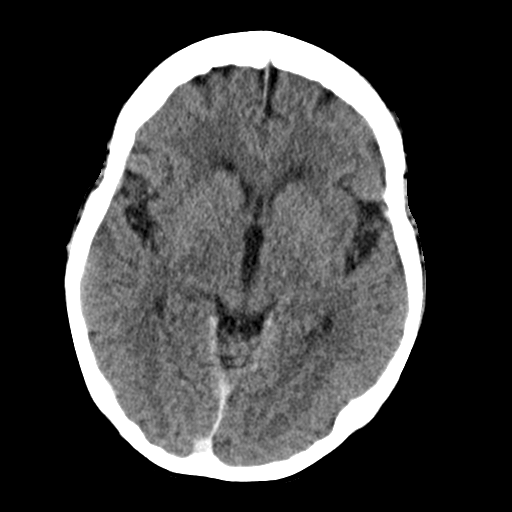
[im 16/31  brain]
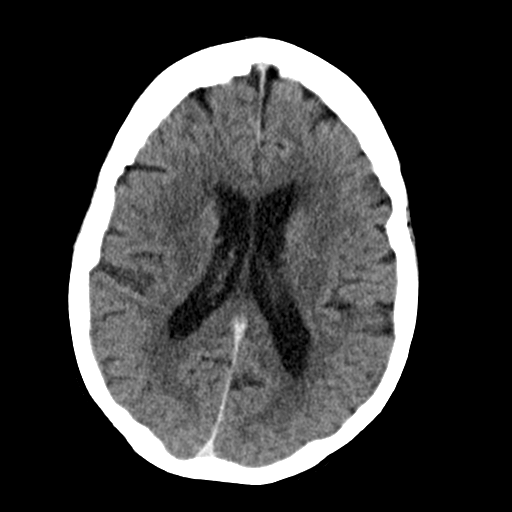
[im 19/31  brain]
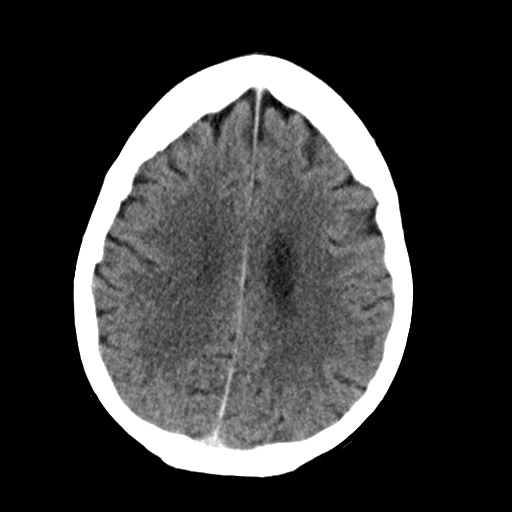
[im 19/31  bone]
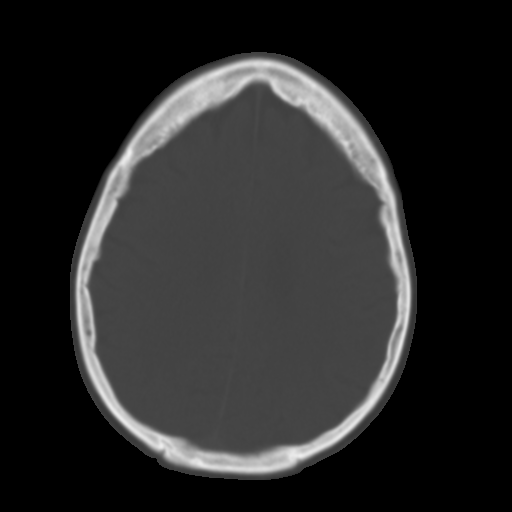
[im 23/31  brain]
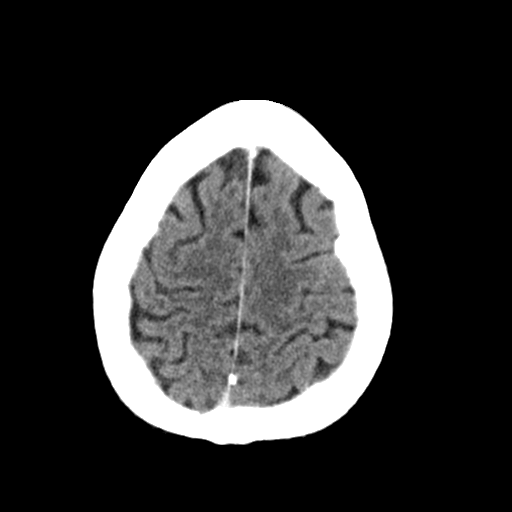
[im 27/31  brain]
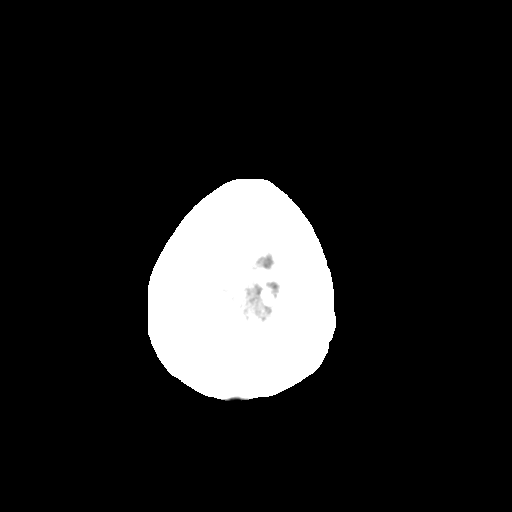

[Series 3: head bone · axial · 0.40mm/px · z∈[-577,-547]mm · 3 of 76 slices shown]
[im 8/76  bone]
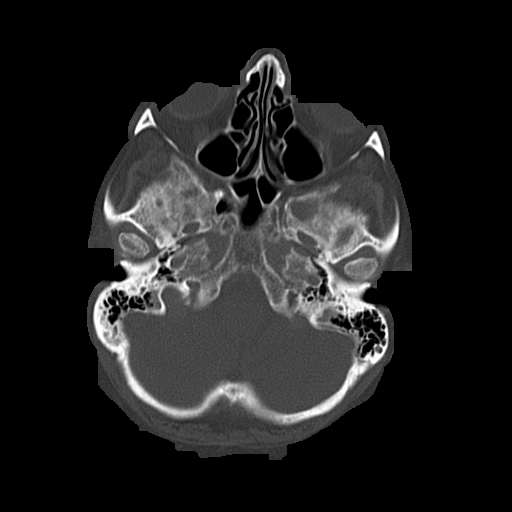
[im 16/76  bone]
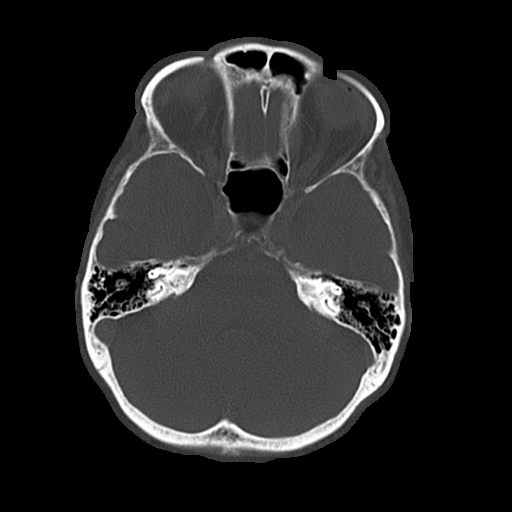
[im 23/76  bone]
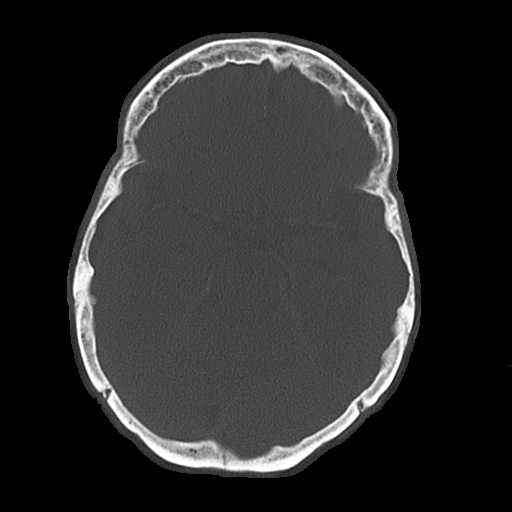

[Series 4: coronal soft · coronal · 0.30mm/px · 3 of 67 slices shown]
[im 23/67  brain]
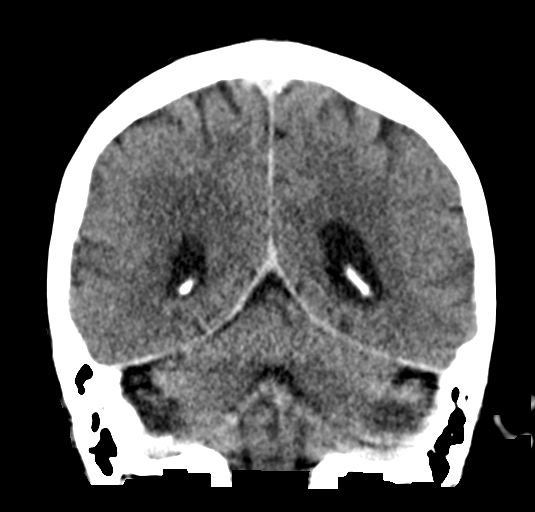
[im 30/67  brain]
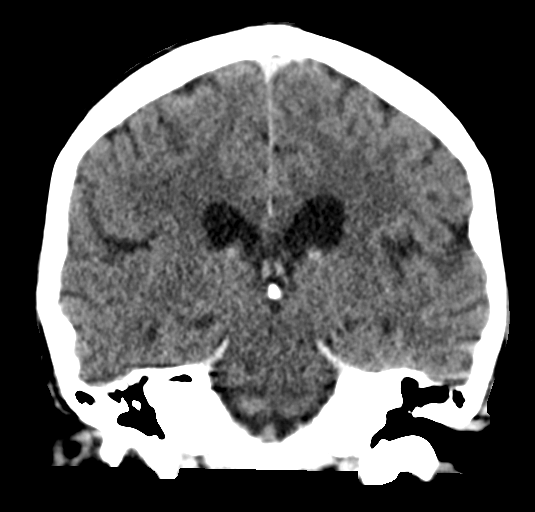
[im 37/67  brain]
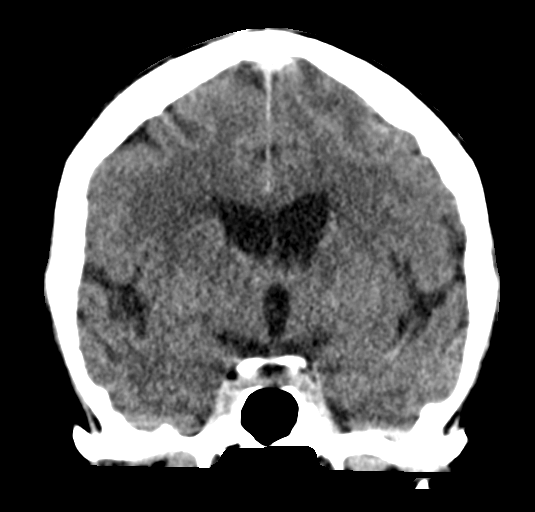

[Series 5: sagittal soft · sagittal · 0.30mm/px · 3 of 55 slices shown]
[im 19/55  brain]
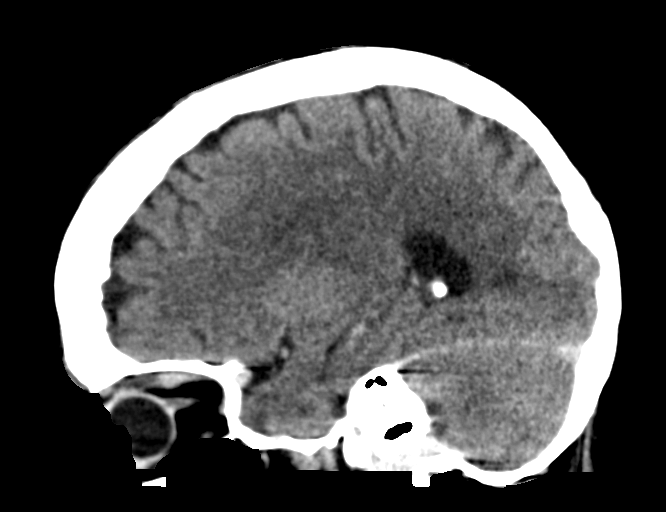
[im 28/55  brain]
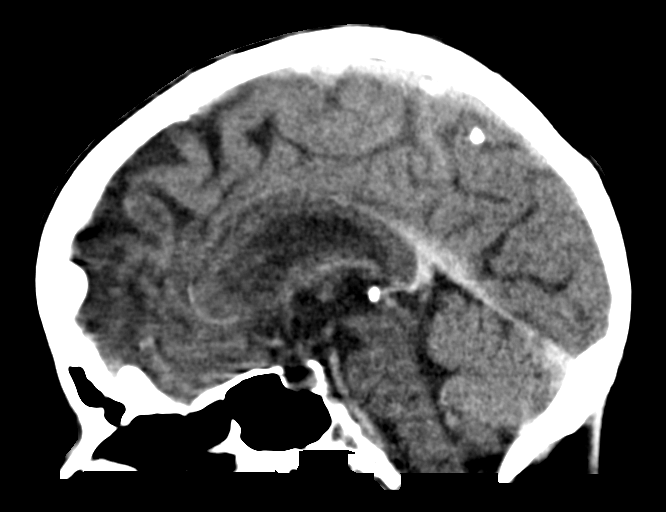
[im 37/55  brain]
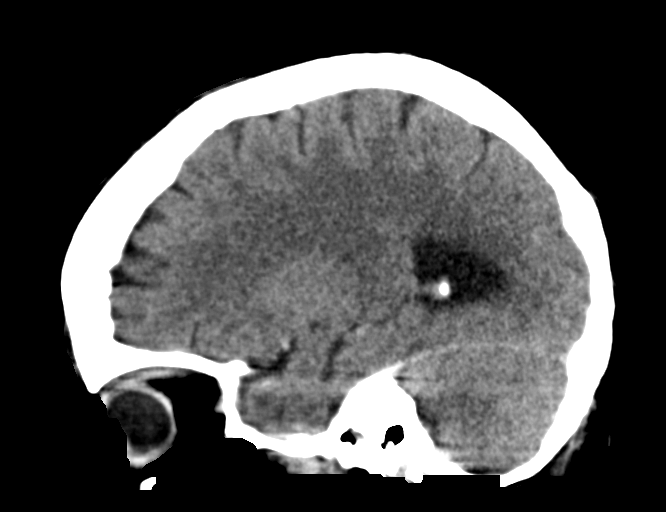

[16 of 47 positions shown; findings below may reference images not displayed]

FINDINGS: Brain: Mild age-related atrophy and chronic microvascular ischemic
changes. There is no acute intracranial hemorrhage. No mass effect
or midline shift. No extra-axial fluid collection.

Vascular: No hyperdense vessel or unexpected calcification.

Skull: Normal. Negative for fracture or focal lesion.

Sinuses/Orbits: No acute finding.

Other: None
IMPRESSION: 1. No acute intracranial pathology.
2. Mild age-related atrophy and chronic microvascular ischemic
changes.

## 2022-11-30 IMAGING — DX DG CHEST 1V PORT
1 series · 1 of 1 positions shown · non-contrast
Comparison: 01/23/2020

CLINICAL DATA: Hyponatremia

EXAM:
PORTABLE CHEST 1 VIEW

[chest ap]
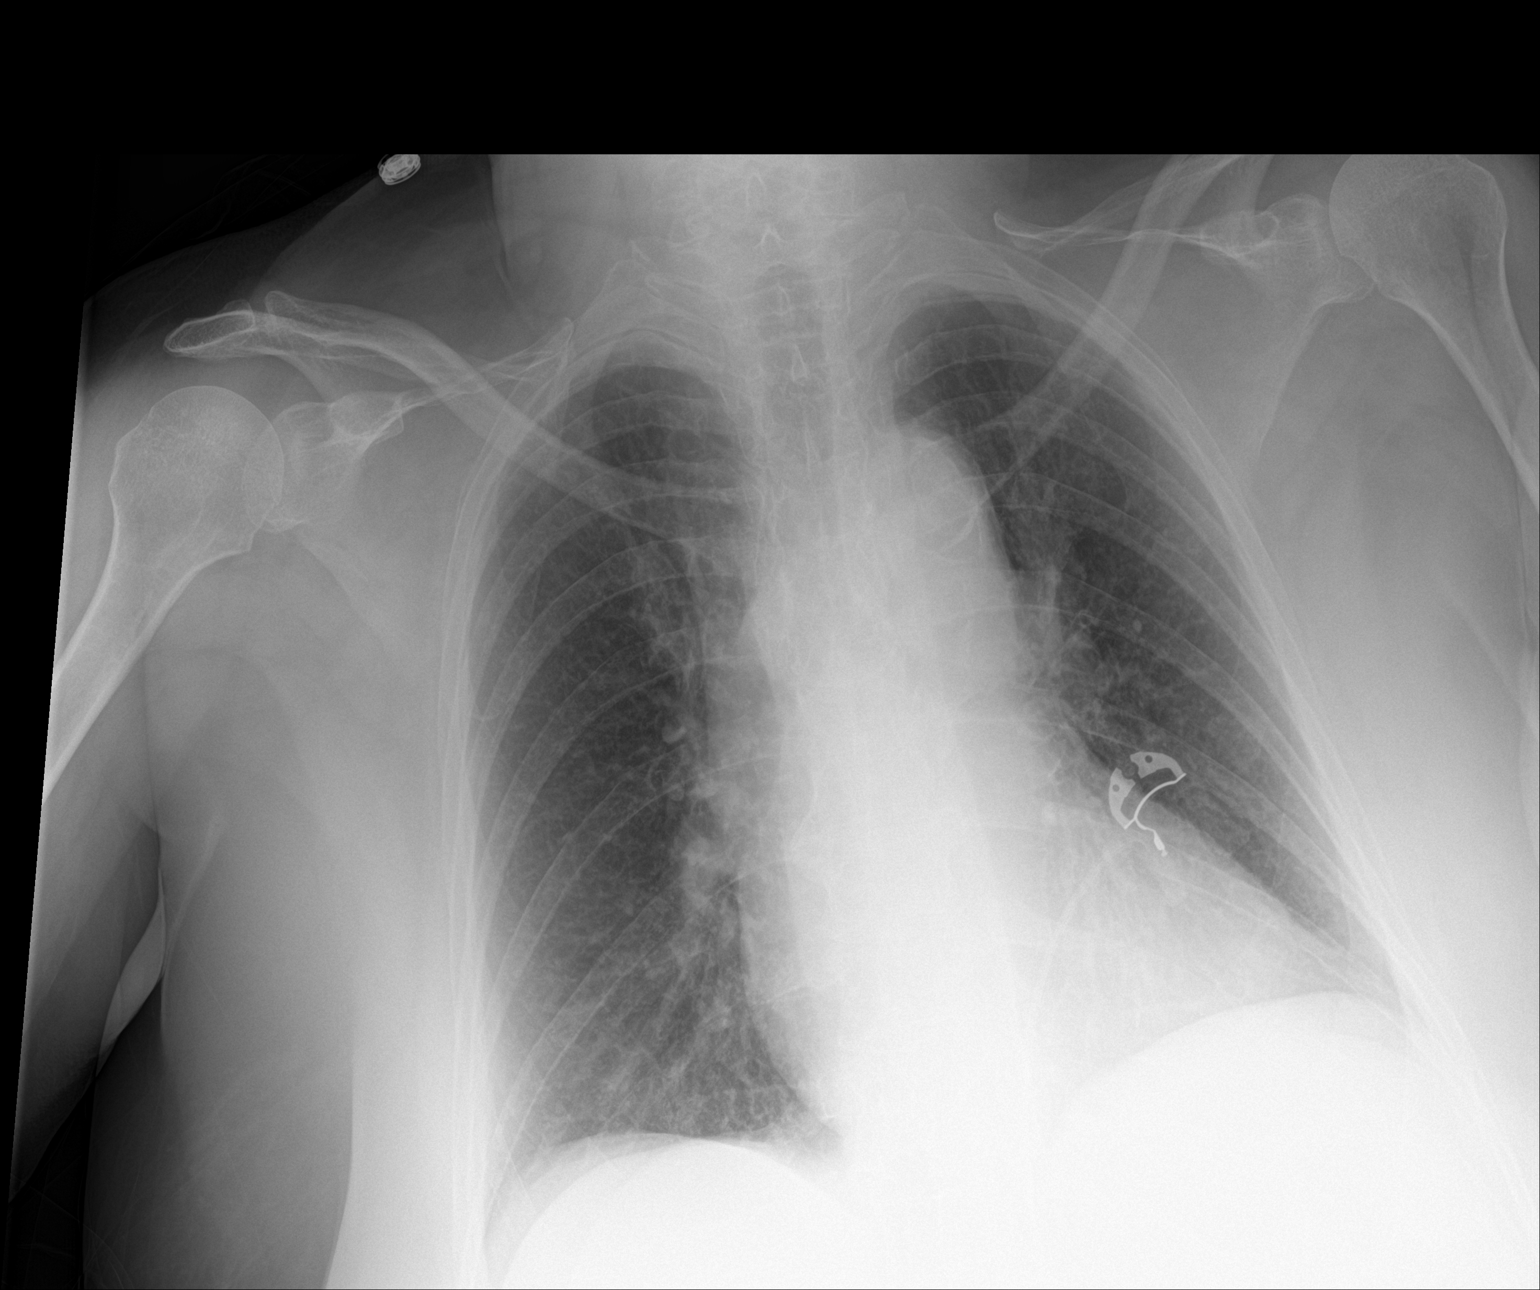

[1 of 1 positions shown; findings below may reference images not displayed]

FINDINGS: Mild hyperinflation of the lungs. Heart is normal size. Mediastinal
contours within normal limits. Aortic calcifications. Lungs clear.
No effusions or acute bony abnormality.
IMPRESSION: Mild hyperinflation.

No acute cardiopulmonary disease.
